# Patient Record
Sex: Female | Born: 1955
Health system: Southern US, Community
[De-identification: ages and names within clinical notes are randomized; demographics above are authoritative.]

## PROBLEM LIST (undated history)

## (undated) DIAGNOSIS — I10 Essential (primary) hypertension: Secondary | ICD-10-CM

## (undated) DIAGNOSIS — I872 Venous insufficiency (chronic) (peripheral): Secondary | ICD-10-CM

## (undated) DIAGNOSIS — E785 Hyperlipidemia, unspecified: Secondary | ICD-10-CM

## (undated) DIAGNOSIS — G629 Polyneuropathy, unspecified: Secondary | ICD-10-CM

## (undated) DIAGNOSIS — G9511 Acute infarction of spinal cord (embolic) (nonembolic): Secondary | ICD-10-CM

## (undated) DIAGNOSIS — R609 Edema, unspecified: Secondary | ICD-10-CM

## (undated) DIAGNOSIS — E282 Polycystic ovarian syndrome: Secondary | ICD-10-CM

## (undated) DIAGNOSIS — E119 Type 2 diabetes mellitus without complications: Secondary | ICD-10-CM

## (undated) DIAGNOSIS — E559 Vitamin D deficiency, unspecified: Secondary | ICD-10-CM

## (undated) DIAGNOSIS — M1990 Unspecified osteoarthritis, unspecified site: Secondary | ICD-10-CM

## (undated) DIAGNOSIS — I809 Phlebitis and thrombophlebitis of unspecified site: Secondary | ICD-10-CM

## (undated) HISTORY — DX: Venous insufficiency (chronic) (peripheral): I87.2

## (undated) HISTORY — DX: Edema, unspecified: R60.9

## (undated) HISTORY — DX: Type 2 diabetes mellitus without complications: E11.9

## (undated) HISTORY — PX: OTHER SURGICAL HISTORY: SHX169

## (undated) HISTORY — DX: Phlebitis and thrombophlebitis of unspecified site: I80.9

## (undated) HISTORY — DX: Polycystic ovarian syndrome: E28.2

## (undated) HISTORY — PX: BREAST BIOPSY: SHX20

## (undated) HISTORY — DX: Hyperlipidemia, unspecified: E78.5

## (undated) HISTORY — DX: Vitamin D deficiency, unspecified: E55.9

---

## 2001-03-11 ENCOUNTER — Encounter: Payer: Self-pay | Admitting: Family Medicine

## 2001-03-11 ENCOUNTER — Encounter: Admission: RE | Admit: 2001-03-11 | Discharge: 2001-03-11 | Payer: Self-pay | Admitting: Family Medicine

## 2006-06-23 ENCOUNTER — Ambulatory Visit (HOSPITAL_COMMUNITY): Admission: RE | Admit: 2006-06-23 | Discharge: 2006-06-23 | Payer: Self-pay | Admitting: Obstetrics & Gynecology

## 2006-07-15 ENCOUNTER — Encounter: Admission: RE | Admit: 2006-07-15 | Discharge: 2006-10-13 | Payer: Self-pay | Admitting: Family Medicine

## 2006-08-12 ENCOUNTER — Ambulatory Visit: Payer: Self-pay | Admitting: Gynecology

## 2006-08-26 ENCOUNTER — Ambulatory Visit: Payer: Self-pay | Admitting: Obstetrics and Gynecology

## 2007-03-23 ENCOUNTER — Other Ambulatory Visit: Admission: RE | Admit: 2007-03-23 | Discharge: 2007-03-23 | Payer: Self-pay | Admitting: Family Medicine

## 2007-06-28 ENCOUNTER — Ambulatory Visit (HOSPITAL_COMMUNITY): Admission: RE | Admit: 2007-06-28 | Discharge: 2007-06-28 | Payer: Self-pay | Admitting: Family Medicine

## 2008-06-28 ENCOUNTER — Ambulatory Visit (HOSPITAL_COMMUNITY): Admission: RE | Admit: 2008-06-28 | Discharge: 2008-06-28 | Payer: Self-pay | Admitting: Family Medicine

## 2009-03-11 ENCOUNTER — Other Ambulatory Visit: Admission: RE | Admit: 2009-03-11 | Discharge: 2009-03-11 | Payer: Self-pay | Admitting: Family Medicine

## 2009-07-01 ENCOUNTER — Ambulatory Visit (HOSPITAL_COMMUNITY): Admission: RE | Admit: 2009-07-01 | Discharge: 2009-07-01 | Payer: Self-pay | Admitting: Family Medicine

## 2009-08-31 HISTORY — PX: JOINT REPLACEMENT: SHX530

## 2010-03-12 ENCOUNTER — Other Ambulatory Visit: Admission: RE | Admit: 2010-03-12 | Discharge: 2010-03-12 | Payer: Self-pay | Admitting: Family Medicine

## 2010-04-30 ENCOUNTER — Inpatient Hospital Stay (HOSPITAL_COMMUNITY): Admission: RE | Admit: 2010-04-30 | Discharge: 2010-05-03 | Payer: Self-pay | Admitting: Orthopedic Surgery

## 2010-07-02 ENCOUNTER — Ambulatory Visit (HOSPITAL_COMMUNITY): Admission: RE | Admit: 2010-07-02 | Discharge: 2010-07-02 | Payer: Self-pay | Admitting: Family Medicine

## 2010-11-13 LAB — GLUCOSE, CAPILLARY
Glucose-Capillary: 114 mg/dL — ABNORMAL HIGH (ref 70–99)
Glucose-Capillary: 132 mg/dL — ABNORMAL HIGH (ref 70–99)
Glucose-Capillary: 133 mg/dL — ABNORMAL HIGH (ref 70–99)
Glucose-Capillary: 137 mg/dL — ABNORMAL HIGH (ref 70–99)
Glucose-Capillary: 140 mg/dL — ABNORMAL HIGH (ref 70–99)
Glucose-Capillary: 164 mg/dL — ABNORMAL HIGH (ref 70–99)
Glucose-Capillary: 177 mg/dL — ABNORMAL HIGH (ref 70–99)

## 2010-11-13 LAB — CBC
HCT: 30.7 % — ABNORMAL LOW (ref 36.0–46.0)
HCT: 32.1 % — ABNORMAL LOW (ref 36.0–46.0)
Hemoglobin: 10.5 g/dL — ABNORMAL LOW (ref 12.0–15.0)
MCHC: 33.9 g/dL (ref 30.0–36.0)
MCV: 84.7 fL (ref 78.0–100.0)
MCV: 84.9 fL (ref 78.0–100.0)
Platelets: 145 10*3/uL — ABNORMAL LOW (ref 150–400)
RBC: 3.56 MIL/uL — ABNORMAL LOW (ref 3.87–5.11)
RDW: 14.5 % (ref 11.5–15.5)
RDW: 14.5 % (ref 11.5–15.5)
WBC: 8.8 10*3/uL (ref 4.0–10.5)

## 2010-11-13 LAB — BASIC METABOLIC PANEL
CO2: 28 mEq/L (ref 19–32)
CO2: 31 mEq/L (ref 19–32)
Chloride: 104 mEq/L (ref 96–112)
Creatinine, Ser: 0.76 mg/dL (ref 0.4–1.2)
Glucose, Bld: 158 mg/dL — ABNORMAL HIGH (ref 70–99)
Potassium: 4.1 mEq/L (ref 3.5–5.1)

## 2010-11-13 LAB — PROTIME-INR
INR: 1.57 — ABNORMAL HIGH (ref 0.00–1.49)
INR: 1.91 — ABNORMAL HIGH (ref 0.00–1.49)
Prothrombin Time: 19 seconds — ABNORMAL HIGH (ref 11.6–15.2)

## 2010-11-14 LAB — COMPREHENSIVE METABOLIC PANEL
AST: 15 U/L (ref 0–37)
Albumin: 3.3 g/dL — ABNORMAL LOW (ref 3.5–5.2)
Alkaline Phosphatase: 69 U/L (ref 39–117)
BUN: 13 mg/dL (ref 6–23)
Chloride: 107 mEq/L (ref 96–112)
GFR calc Af Amer: >60 mL/min (ref >60)
Potassium: 3.7 mEq/L (ref 3.5–5.1)
Total Protein: 7.2 g/dL (ref 6.0–8.3)

## 2010-11-14 LAB — TYPE AND SCREEN: Antibody Screen: NEGATIVE

## 2010-11-14 LAB — URINE MICROSCOPIC-ADD ON

## 2010-11-14 LAB — URINALYSIS, ROUTINE W REFLEX MICROSCOPIC
Hgb urine dipstick: NEGATIVE
Nitrite: NEGATIVE
Specific Gravity, Urine: 1.038 — ABNORMAL HIGH (ref 1.005–1.030)
Urobilinogen, UA: 0.2 mg/dL (ref 0.0–1.0)
pH: 5.5 (ref 5.0–8.0)

## 2010-11-14 LAB — GLUCOSE, CAPILLARY: Glucose-Capillary: 108 mg/dL — ABNORMAL HIGH (ref 70–99)

## 2010-11-14 LAB — CBC
HCT: 39.2 % (ref 36.0–46.0)
Hemoglobin: 13.4 g/dL (ref 12.0–15.0)
MCH: 28.7 pg (ref 26.0–34.0)
MCHC: 34.1 g/dL (ref 30.0–36.0)
MCV: 84.3 fL (ref 78.0–100.0)
RBC: 4.66 MIL/uL (ref 3.87–5.11)
WBC: 6.3 10*3/uL (ref 4.0–10.5)

## 2010-11-14 LAB — PROTIME-INR: INR: 1.01 (ref 0.00–1.49)

## 2010-11-14 LAB — APTT: aPTT: 30 seconds (ref 24–37)

## 2011-01-16 NOTE — Group Therapy Note (Signed)
NAMEMarland Kitchen  Laura Blackwell, Laura Blackwell NO.:  [id number]   MEDICAL RECORD NO.:  [id number]          PATIENT TYPE:  WOC   LOCATION:  [facility]                   FACILITY:  [facility]   PHYSICIAN:  Ginger Carne, MD DATE OF BIRTH:  Jan 28, 1956   DATE OF SERVICE:  08/12/2006                                  CLINIC NOTE   The patient presents today with increasing facial hair and amenorrhea  for 6-7 years.  The patient is a markedly obese female weighing 130 kg,  5 feet 7 inches.  She has always had facial hair, her sideburns as well  as a mustache and hair growth on her breast.  She states that recently  she has had increasing hair growth on her cheeks.  She has no menopausal  symptoms, does not take any over-the-counter herbal or other medications  to cause hirsutism or amenorrhea.  The patient states that she has not  had a bleeding over the past 6-7 years.  Her medical and surgical  history are per her records.  The patient denies abdominal or pelvic  pain.   PHYSICAL EXAM:  Of note is hirsutism of breast as well as her face.   IMPRESSION:  Amenorrhea and probable polycystic ovarian syndrome.   PLAN:  The patient will undergo evaluation for insulin-resistance type 2  diabetes with a three-hour 75 grams oral glucose tolerance test.  In  addition, she will have an FSH, LH, prolactin, TSH level, lipid panel,  testosterone level (total) and 17 hydroxy progesterone level to exclude  the possibility of late onset 21-hydroxylase enzyme deficiency.  More  than likely, this patient is a metabolic syndrome patient with  polycystic ovarian syndrome and insulin resistance based on the weight  and body habitus.  After said testing is completed, she will return in  two weeks following testing for discussion about her testing and any  further workup that is necessary.  I am concerned about the fact that if  she in fact does not have hypothalamic, hypogonadotropic amenorrhea that  she be cycled  to avoid the possibility for the propensity of uterine  carcinoma and hyperplasia.           ______________________________  Ginger Carne, MD     SHB/MEDQ  D:  08/12/2006  T:  08/13/2006  Job:  [id number]

## 2011-01-16 NOTE — Group Therapy Note (Signed)
NAMEMarland Kitchen  Laura Blackwell, Laura Blackwell NO.:  [id number]   MEDICAL RECORD NO.:  [id number]          PATIENT TYPE:  WOC   LOCATION:  [facility]                   FACILITY:  [facility]   PHYSICIAN:  Argentina Donovan, MD        DATE OF BIRTH:  1956/06/17   DATE OF SERVICE:                                  CLINIC NOTE   GYN Clinic   CHIEF COMPLAINT:  Follow up hirsutism and amenorrhea.   SUBJECTIVE:  Patient is a 55 year old African American female with  increasing facial hair as well as amenorrhea for the past 6-7 years.  She was seen 2 weeks ago for her initial evaluation.  She was felt to  have probable PCOS, and is here for a followup of her laboratory  results.  Please see prior note for full details of this visit.   OBJECTIVE:  GENERAL EXAM:  Pleasant in no acute distress.  Patient does  have hirsutism on her chest as well as her face.   LABORATORY DATA:  Showed a total cholesterol 223, LDL 144, TSH 0.985,  FSH 37.9, LH 23.9, prolactin 5.7, testosterone elevated at 91.08, 2-hour  glucose tolerance 167, 1-hour glucose tolerance 212, fasting glucose 99,  17-hydroxyprogesterone 11.   IMPRESSION:  1. Amenorrhea secondary to polycystic ovary syndrome.  2. Hirsutism.   PLAN:  I had a long discussion with patient on her need for weight loss.  Will start patient on Lo Ovral given her propensity for uterine  carcinoma and endometrial hyperplasia.  Patient will follow up in 3-4  months with Dr. Mia Creek.  Counseled patient that it will likely to take  over a year for her hirsutism to improve.  Will consider starting  Metformin at that visit in 3-4 months.  Patient will need renal function  checked prior to initiating this medication.  Counseled patient that she  will need close followup to monitor her insulin resistance, as well as  other risk factors including her cholesterol.     ______________________________  Benn Moulder, MD    ______________________________  Argentina Donovan,  MD    MR/MEDQ  D:  08/26/2006  T:  08/26/2006  Job:  [id number]

## 2011-05-18 ENCOUNTER — Other Ambulatory Visit (HOSPITAL_COMMUNITY): Payer: Self-pay | Admitting: Family Medicine

## 2011-05-18 DIAGNOSIS — Z1231 Encounter for screening mammogram for malignant neoplasm of breast: Secondary | ICD-10-CM

## 2011-07-07 ENCOUNTER — Ambulatory Visit (HOSPITAL_COMMUNITY)
Admission: RE | Admit: 2011-07-07 | Discharge: 2011-07-07 | Disposition: A | Discharge status: Home / Self Care | Payer: 59 | Source: Ambulatory Visit | Attending: Family Medicine | Admitting: Family Medicine

## 2011-07-07 DIAGNOSIS — Z1231 Encounter for screening mammogram for malignant neoplasm of breast: Secondary | ICD-10-CM | POA: Insufficient documentation

## 2012-06-15 ENCOUNTER — Other Ambulatory Visit (HOSPITAL_COMMUNITY): Payer: Self-pay | Admitting: Family Medicine

## 2012-06-15 DIAGNOSIS — Z1231 Encounter for screening mammogram for malignant neoplasm of breast: Secondary | ICD-10-CM

## 2012-07-07 ENCOUNTER — Ambulatory Visit (HOSPITAL_COMMUNITY)
Admission: RE | Admit: 2012-07-07 | Discharge: 2012-07-07 | Disposition: A | Discharge status: Home / Self Care | Payer: 59 | Source: Ambulatory Visit | Attending: Family Medicine | Admitting: Family Medicine

## 2012-07-07 DIAGNOSIS — Z1231 Encounter for screening mammogram for malignant neoplasm of breast: Secondary | ICD-10-CM

## 2013-04-06 ENCOUNTER — Other Ambulatory Visit (HOSPITAL_COMMUNITY): Payer: Self-pay | Admitting: Family Medicine

## 2013-04-06 DIAGNOSIS — Z1231 Encounter for screening mammogram for malignant neoplasm of breast: Secondary | ICD-10-CM

## 2013-07-10 ENCOUNTER — Ambulatory Visit (HOSPITAL_COMMUNITY)
Admission: RE | Admit: 2013-07-10 | Discharge: 2013-07-10 | Disposition: A | Discharge status: Home / Self Care | Payer: 59 | Source: Ambulatory Visit | Attending: Family Medicine | Admitting: Family Medicine

## 2013-07-10 ENCOUNTER — Other Ambulatory Visit (HOSPITAL_COMMUNITY): Payer: Self-pay | Admitting: Family Medicine

## 2013-07-10 DIAGNOSIS — Z1231 Encounter for screening mammogram for malignant neoplasm of breast: Secondary | ICD-10-CM

## 2014-01-30 ENCOUNTER — Other Ambulatory Visit (HOSPITAL_COMMUNITY): Admission: RE | Admit: 2014-01-30 | Payer: 59 | Source: Ambulatory Visit | Admitting: Family Medicine

## 2014-01-30 ENCOUNTER — Other Ambulatory Visit: Payer: Self-pay | Admitting: Family Medicine

## 2014-01-30 DIAGNOSIS — Z0000 Encounter for general adult medical examination without abnormal findings: Secondary | ICD-10-CM | POA: Insufficient documentation

## 2014-05-25 ENCOUNTER — Other Ambulatory Visit (HOSPITAL_COMMUNITY): Payer: Self-pay | Admitting: Family Medicine

## 2014-05-25 DIAGNOSIS — Z1231 Encounter for screening mammogram for malignant neoplasm of breast: Secondary | ICD-10-CM

## 2014-07-12 ENCOUNTER — Ambulatory Visit (HOSPITAL_COMMUNITY)
Admission: RE | Admit: 2014-07-12 | Discharge: 2014-07-12 | Disposition: A | Discharge status: Home / Self Care | Payer: 59 | Source: Ambulatory Visit | Attending: Family Medicine | Admitting: Family Medicine

## 2014-07-12 DIAGNOSIS — Z1231 Encounter for screening mammogram for malignant neoplasm of breast: Secondary | ICD-10-CM | POA: Diagnosis present

## 2015-06-10 ENCOUNTER — Other Ambulatory Visit: Payer: Self-pay

## 2015-06-10 DIAGNOSIS — Z1231 Encounter for screening mammogram for malignant neoplasm of breast: Secondary | ICD-10-CM

## 2015-07-16 ENCOUNTER — Ambulatory Visit
Admission: RE | Admit: 2015-07-16 | Discharge: 2015-07-16 | Disposition: A | Discharge status: Home / Self Care | Payer: 59 | Source: Ambulatory Visit

## 2015-07-16 DIAGNOSIS — Z1231 Encounter for screening mammogram for malignant neoplasm of breast: Secondary | ICD-10-CM

## 2015-09-18 ENCOUNTER — Ambulatory Visit (INDEPENDENT_AMBULATORY_CARE_PROVIDER_SITE_OTHER): Payer: 59 | Admitting: Podiatry

## 2015-09-18 ENCOUNTER — Encounter: Payer: Self-pay | Admitting: Podiatry

## 2015-09-18 VITALS — BP 110/72 | HR 86 | Resp 12

## 2015-09-18 DIAGNOSIS — L03031 Cellulitis of right toe: Secondary | ICD-10-CM

## 2015-09-18 DIAGNOSIS — L03011 Cellulitis of right finger: Secondary | ICD-10-CM

## 2015-09-18 MED ORDER — AMOXICILLIN-POT CLAVULANATE 875-125 MG PO TABS: 1.0000 | ORAL_TABLET | Freq: Two times a day (BID) | ORAL | Status: DC

## 2015-09-18 NOTE — Progress Notes (Signed)
   Subjective:    Patient ID: Laura Blackwell, female    DOB: 1956-01-27, 60 y.o.   MRN: [id number]  HPI   Today this patient complains of a draining sore right hallux toenail area for the past 3 weeks with persistence of drainage and low-grade discomfort with direct pressure and shoe wearing. Patient has been treating the area with a Band-Aid and presents today for evaluation.  Patient is a diabetic for approximately 3 years and describes her A1c at 6.5 She denies any history of ulceration, claudication or amputation  Patient describes left lower extremity chronic edema since they D 82 associated with trauma surgery  Review of Systems  Musculoskeletal: Positive for back pain.  Skin: Positive for color change.       Objective:   Physical Exam  Orientated 3  Vascular: Chronic lymphedema left lower extremity No palpable tenderness in calves bilaterally DP pulses 2/4 bilaterally PT pulses 2/4 right and nonpalpable left (please note patient has chronic edema making it difficult to palpate the pulse  Neurological: Sensation to 10 g monofilament wire intact 5/5 bilaterally Vibratory sensation intact bilaterally Ankle reflex equal and reactive bilaterally  Dermatological: No open skin lesions bilaterally Keratoses fifth toe right Right hallux nail plate is partially detached from the underlying nailbed with with a serous bloody drainage noted on the Band-Aid The toenails in general are elongated with occasional texture and color changes noted distally  (The right hallux nail plate was debrided from the nailbed revealing a vascular lesion 10 mm in diameter with some partial detachment the nailbed. There is slight malodor in the area without active drainage. There is no erythema or warmth)  Musculoskeletal: Palpable tenderness in the dorsal right hallux with mobility of the nail plate      Assessment & Plan:   Assessment: Chronic lymphedema left lower  extremity Protective sensation intact Vascular lesion right nailbed with possible low-grade infection  Plan: The right hallux nail plate was easily debrided from the nailbed revealing a loosely attached vascular lesion that bled easily when gently moved. The lesion was not excised. An antibiotic dressing was applied  Patient will begin soaks and daily application of topical antibiotic and cover with a Band-Aid Rx Augmentin 875/125 by mouth twice a day 7 days one refill  Reevaluate 2 weeks

## 2015-09-18 NOTE — Patient Instructions (Signed)
Today her diabetic foot examination demonstrated chronic swelling in the left lower extremity Beneath the right great toenail after debridement there was a fleshy/vascular-like growth in the nailbed with slight drainage and malodor Begin soaks including Epsom salt soaks or antibacterial soft soap rinses daily and apply topical antibiotic ointment and cover with a Band-Aid Begin taking antibiotics by mouth one twice a day 7 days Wear a soft tissue on the right foot  Diabetes and Foot Care Diabetes may cause you to have problems because of poor blood supply (circulation) to your feet and legs. This may cause the skin on your feet to become thinner, break easier, and heal more slowly. Your skin may become dry, and the skin may peel and crack. You may also have nerve damage in your legs and feet causing decreased feeling in them. You may not notice minor injuries to your feet that could lead to infections or more serious problems. Taking care of your feet is one of the most important things you can do for yourself.  HOME CARE INSTRUCTIONS  Wear shoes at all times, even in the house. Do not go barefoot. Bare feet are easily injured.  Check your feet daily for blisters, cuts, and redness. If you cannot see the bottom of your feet, use a mirror or ask someone for help.  Wash your feet with warm water (do not use hot water) and mild soap. Then pat your feet and the areas between your toes until they are completely dry. Do not soak your feet as this can dry your skin.  Apply a moisturizing lotion or petroleum jelly (that does not contain alcohol and is unscented) to the skin on your feet and to dry, brittle toenails. Do not apply lotion between your toes.  Trim your toenails straight across. Do not dig under them or around the cuticle. File the edges of your nails with an emery board or nail file.  Do not cut corns or calluses or try to remove them with medicine.  Wear clean socks or stockings every  day. Make sure they are not too tight. Do not wear knee-high stockings since they may decrease blood flow to your legs.  Wear shoes that fit properly and have enough cushioning. To break in new shoes, wear them for just a few hours a day. This prevents you from injuring your feet. Always look in your shoes before you put them on to be sure there are no objects inside.  Do not cross your legs. This may decrease the blood flow to your feet.  If you find a minor scrape, cut, or break in the skin on your feet, keep it and the skin around it clean and dry. These areas may be cleansed with mild soap and water. Do not cleanse the area with peroxide, alcohol, or iodine.  When you remove an adhesive bandage, be sure not to damage the skin around it.  If you have a wound, look at it several times a day to make sure it is healing.  Do not use heating pads or hot water bottles. They may burn your skin. If you have lost feeling in your feet or legs, you may not know it is happening until it is too late.  Make sure your health care provider performs a complete foot exam at least annually or more often if you have foot problems. Report any cuts, sores, or bruises to your health care provider immediately. SEEK MEDICAL CARE IF:   You have an  injury that is not healing.  You have cuts or breaks in the skin.  You have an ingrown nail.  You notice redness on your legs or feet.  You feel burning or tingling in your legs or feet.  You have pain or cramps in your legs and feet.  Your legs or feet are numb.  Your feet always feel cold. SEEK IMMEDIATE MEDICAL CARE IF:   There is increasing redness, swelling, or pain in or around a wound.  There is a red line that goes up your leg.  Pus is coming from a wound.  You develop a fever or as directed by your health care provider.  You notice a bad smell coming from an ulcer or wound.   This information is not intended to replace advice given to you by  your health care provider. Make sure you discuss any questions you have with your health care provider.   Document Released: 08/14/2000 Document Revised: 04/19/2013 Document Reviewed: 01/24/2013 Elsevier Interactive Patient Education [employer].

## 2015-10-02 ENCOUNTER — Ambulatory Visit (INDEPENDENT_AMBULATORY_CARE_PROVIDER_SITE_OTHER): Payer: 59 | Admitting: Podiatry

## 2015-10-02 ENCOUNTER — Encounter: Payer: Self-pay | Admitting: Podiatry

## 2015-10-02 DIAGNOSIS — L03031 Cellulitis of right toe: Secondary | ICD-10-CM | POA: Diagnosis not present

## 2015-10-02 DIAGNOSIS — L03011 Cellulitis of right finger: Secondary | ICD-10-CM

## 2015-10-02 NOTE — Patient Instructions (Signed)
Apply Vaseline and a Band-Aid to the right big toenail bed daily until the nailbed looks normal and is not sensitive Diabetes and Foot Care Diabetes may cause you to have problems because of poor blood supply (circulation) to your feet and legs. This may cause the skin on your feet to become thinner, break easier, and heal more slowly. Your skin may become dry, and the skin may peel and crack. You may also have nerve damage in your legs and feet causing decreased feeling in them. You may not notice minor injuries to your feet that could lead to infections or more serious problems. Taking care of your feet is one of the most important things you can do for yourself.  HOME CARE INSTRUCTIONS  Wear shoes at all times, even in the house. Do not go barefoot. Bare feet are easily injured.  Check your feet daily for blisters, cuts, and redness. If you cannot see the bottom of your feet, use a mirror or ask someone for help.  Wash your feet with warm water (do not use hot water) and mild soap. Then pat your feet and the areas between your toes until they are completely dry. Do not soak your feet as this can dry your skin.  Apply a moisturizing lotion or petroleum jelly (that does not contain alcohol and is unscented) to the skin on your feet and to dry, brittle toenails. Do not apply lotion between your toes.  Trim your toenails straight across. Do not dig under them or around the cuticle. File the edges of your nails with an emery board or nail file.  Do not cut corns or calluses or try to remove them with medicine.  Wear clean socks or stockings every day. Make sure they are not too tight. Do not wear knee-high stockings since they may decrease blood flow to your legs.  Wear shoes that fit properly and have enough cushioning. To break in new shoes, wear them for just a few hours a day. This prevents you from injuring your feet. Always look in your shoes before you put them on to be sure there are no  objects inside.  Do not cross your legs. This may decrease the blood flow to your feet.  If you find a minor scrape, cut, or break in the skin on your feet, keep it and the skin around it clean and dry. These areas may be cleansed with mild soap and water. Do not cleanse the area with peroxide, alcohol, or iodine.  When you remove an adhesive bandage, be sure not to damage the skin around it.  If you have a wound, look at it several times a day to make sure it is healing.  Do not use heating pads or hot water bottles. They may burn your skin. If you have lost feeling in your feet or legs, you may not know it is happening until it is too late.  Make sure your health care provider performs a complete foot exam at least annually or more often if you have foot problems. Report any cuts, sores, or bruises to your health care provider immediately. SEEK MEDICAL CARE IF:   You have an injury that is not healing.  You have cuts or breaks in the skin.  You have an ingrown nail.  You notice redness on your legs or feet.  You feel burning or tingling in your legs or feet.  You have pain or cramps in your legs and feet.  Your legs  or feet are numb.  Your feet always feel cold. SEEK IMMEDIATE MEDICAL CARE IF:   There is increasing redness, swelling, or pain in or around a wound.  There is a red line that goes up your leg.  Pus is coming from a wound.  You develop a fever or as directed by your health care provider.  You notice a bad smell coming from an ulcer or wound.   This information is not intended to replace advice given to you by your health care provider. Make sure you discuss any questions you have with your health care provider.   Document Released: 08/14/2000 Document Revised: 04/19/2013 Document Reviewed: 01/24/2013 Elsevier Interactive Patient Education [employer].

## 2015-10-03 NOTE — Progress Notes (Signed)
Patient ID: Laura Blackwell, female   DOB: 1955-09-16, 60 y.o.   MRN: [id number]  Subjective: This patient presents for follow-up care for paronychia right hallux. The nail was debrided and Augmentin 875/125 by mouth twice a day was prescribed. Patient had completed antibiotic. There was a loosely attached vascular lesion noted on the dorsal aspect of the right hallux nailbed after debridement  Objective: Right hallux nail has been debrided as described above. There is a residual crusted area on the proximal nailbed that is beginning to slough. There is no surrounding erythema, edema, drainage , warmth.  Assessment: Resolved paronychia and resolving vascular lesion the dorsal aspect the right foot  Plan: Patient will apply Vaseline on Band-Aid to the right hallux nailbed and cover with a Band-Aid to the nailbed symptoms a normal smooth appearance  Reappoint at patient's request Also, patient requested toenail debridement which was done today. I advised her that she could go to the pedicurist to have her nails trimmed with exceptions: No whirlpool and no manipulation of the cuticles. The pedicurist could just trim the distal nail since it smooth the distal aspects of the nail plates  Patient returned if she has any future concerns or yearly

## 2015-11-25 ENCOUNTER — Ambulatory Visit: Payer: Self-pay | Admitting: Orthopedic Surgery

## 2015-11-26 ENCOUNTER — Encounter (HOSPITAL_COMMUNITY): Payer: Self-pay

## 2015-11-26 ENCOUNTER — Encounter (HOSPITAL_COMMUNITY)
Admission: RE | Admit: 2015-11-26 | Discharge: 2015-11-26 | Disposition: A | Discharge status: Home / Self Care | Payer: 59 | Source: Ambulatory Visit | Attending: Orthopedic Surgery | Admitting: Orthopedic Surgery

## 2015-11-26 DIAGNOSIS — M1712 Unilateral primary osteoarthritis, left knee: Secondary | ICD-10-CM | POA: Diagnosis not present

## 2015-11-26 DIAGNOSIS — Z01812 Encounter for preprocedural laboratory examination: Secondary | ICD-10-CM | POA: Insufficient documentation

## 2015-11-26 HISTORY — DX: Unspecified osteoarthritis, unspecified site: M19.90

## 2015-11-26 HISTORY — DX: Edema, unspecified: R60.9

## 2015-11-26 HISTORY — DX: Essential (primary) hypertension: I10

## 2015-11-26 LAB — COMPREHENSIVE METABOLIC PANEL
ALBUMIN: 3.5 g/dL (ref 3.5–5.0)
ALK PHOS: 73 U/L (ref 38–126)
ALT: 17 U/L (ref 14–54)
AST: 15 U/L (ref 15–41)
Anion gap: 9 (ref 5–15)
BILIRUBIN TOTAL: 0.4 mg/dL (ref 0.3–1.2)
BUN: 16 mg/dL (ref 6–20)
CALCIUM: 10.1 mg/dL (ref 8.9–10.3)
CO2: 26 mmol/L (ref 22–32)
Chloride: 106 mmol/L (ref 101–111)
Creatinine, Ser: 0.66 mg/dL (ref 0.44–1.00)
GFR calc Af Amer: >60 mL/min (ref >60)
GFR calc non Af Amer: >60 mL/min (ref >60)
GLUCOSE: 153 mg/dL — AB (ref 65–99)
Potassium: 4.2 mmol/L (ref 3.5–5.1)
Sodium: 141 mmol/L (ref 135–145)
TOTAL PROTEIN: 7.1 g/dL (ref 6.5–8.1)

## 2015-11-26 LAB — CBC
HEMATOCRIT: 39 % (ref 36.0–46.0)
HEMOGLOBIN: 12.6 g/dL (ref 12.0–15.0)
MCH: 27.2 pg (ref 26.0–34.0)
MCHC: 32.3 g/dL (ref 30.0–36.0)
MCV: 84.2 fL (ref 78.0–100.0)
Platelets: 229 10*3/uL (ref 150–400)
RBC: 4.63 MIL/uL (ref 3.87–5.11)
RDW: 15.2 % (ref 11.5–15.5)
WBC: 6.3 10*3/uL (ref 4.0–10.5)

## 2015-11-26 LAB — URINE MICROSCOPIC-ADD ON

## 2015-11-26 LAB — PROTIME-INR
INR: 1.44 (ref 0.00–1.49)
Prothrombin Time: 17.7 seconds — ABNORMAL HIGH (ref 11.6–15.2)

## 2015-11-26 LAB — URINALYSIS, ROUTINE W REFLEX MICROSCOPIC
BILIRUBIN URINE: NEGATIVE
Glucose, UA: NEGATIVE mg/dL
Hgb urine dipstick: NEGATIVE
Ketones, ur: NEGATIVE mg/dL
NITRITE: NEGATIVE
PROTEIN: NEGATIVE mg/dL
SPECIFIC GRAVITY, URINE: 1.029 (ref 1.005–1.030)
pH: 6 (ref 5.0–8.0)

## 2015-11-26 LAB — SURGICAL PCR SCREEN
MRSA, PCR: NEGATIVE
STAPHYLOCOCCUS AUREUS: NEGATIVE

## 2015-11-26 LAB — APTT: APTT: 31 s (ref 24–37)

## 2015-11-26 NOTE — Progress Notes (Signed)
A1C done 11/14/15 on chart  EKG- 01/2015 on chart and LOV from PCp done 11/14/15 on chart

## 2015-11-26 NOTE — Progress Notes (Signed)
U/A and micro done 11/26/15 faxed via [facility] to Dr Wynelle Link.

## 2015-11-26 NOTE — Patient Instructions (Signed)
Laura Blackwell  11/26/2015   Your procedure is scheduled on: 12/02/2015    Report to [facility] Main  Entrance take [place]  elevators to 3rd floor to  [place] at    [phone].  Call this number if you have problems the morning of surgery [phone]   Remember: ONLY 1 PERSON MAY GO WITH YOU TO SHORT STAY TO GET  READY MORNING OF Laura Blackwell.  Do not eat food or drink liquids :After Midnight.  Eat a good healthy snack prior to bedtime    Take these medicines the morning of surgery with A SIP OF WATER: Amlodipine ( NOrvasc), Hydrocodone if needed  DO NOT TAKE ANY DIABETIC MEDICATIONS DAY OF YOUR SURGERY                               You may not have any metal on your body including hair pins and              piercings  Do not wear jewelry, make-up, lotions, powders or perfumes, deodorant             Do not wear nail polish.  Do not shave  48 hours prior to surgery.     Do not bring valuables to the hospital. [name].  Contacts, dentures or bridgework may not be worn into surgery.  Leave suitcase in the car. After surgery it may be brought to your room.     Marland Kitchen    Special Instructions: coughing and deep breathing exercises, leg exercises               Please read over the following fact sheets you were given: _____________________________________________________________________             [facility] - Preparing for Surgery Before surgery, you can play an important role.  Because skin is not sterile, your skin needs to be as free of germs as possible.  You can reduce the number of germs on your skin by washing with CHG (chlorahexidine gluconate) soap before surgery.  CHG is an antiseptic cleaner which kills germs and bonds with the skin to continue killing germs even after washing. Please DO NOT use if you have an allergy to CHG or antibacterial soaps.  If your skin becomes reddened/irritated  stop using the CHG and inform your nurse when you arrive at Short Stay. Do not shave (including legs and underarms) for at least 48 hours prior to the first CHG shower.  You may shave your face/neck. Please follow these instructions carefully:  1.  Shower with CHG Soap the night before surgery and the  morning of Surgery.  2.  If you choose to wash your hair, wash your hair first as usual with your  normal  shampoo.  3.  After you shampoo, rinse your hair and body thoroughly to remove the  shampoo.                           4.  Use CHG as you would any other liquid soap.  You can apply chg directly  to the skin and wash  Gently with a scrungie or clean washcloth.  5.  Apply the CHG Soap to your body ONLY FROM THE NECK DOWN.   Do not use on face/ open                           Wound or open sores. Avoid contact with eyes, ears mouth and genitals (private parts).                       Wash face,  Genitals (private parts) with your normal soap.             6.  Wash thoroughly, paying special attention to the area where your surgery  will be performed.  7.  Thoroughly rinse your body with warm water from the neck down.  8.  DO NOT shower/wash with your normal soap after using and rinsing off  the CHG Soap.                9.  Pat yourself dry with a clean towel.            10.  Wear clean pajamas.            11.  Place clean sheets on your bed the night of your first shower and do not  sleep with pets. Day of Surgery : Do not apply any lotions/deodorants the morning of surgery.  Please wear clean clothes to the hospital/surgery center.  FAILURE TO FOLLOW THESE INSTRUCTIONS MAY RESULT IN THE CANCELLATION OF YOUR SURGERY PATIENT SIGNATURE_________________________________  NURSE SIGNATURE__________________________________  ________________________________________________________________________  WHAT IS A BLOOD TRANSFUSION? Blood Transfusion Information  A transfusion is the  replacement of blood or some of its parts. Blood is made up of multiple cells which provide different functions.  Red blood cells carry oxygen and are used for blood loss replacement.  White blood cells fight against infection.  Platelets control bleeding.  Plasma helps clot blood.  Other blood products are available for specialized needs, such as hemophilia or other clotting disorders. BEFORE THE TRANSFUSION  Who gives blood for transfusions?   Healthy volunteers who are fully evaluated to make sure their blood is safe. This is blood bank blood. Transfusion therapy is the safest it has ever been in the practice of medicine. Before blood is taken from a donor, a complete history is taken to make sure that person has no history of diseases nor engages in risky social behavior (examples are intravenous drug use or sexual activity with multiple partners). The donor's travel history is screened to minimize risk of transmitting infections, such as malaria. The donated blood is tested for signs of infectious diseases, such as HIV and hepatitis. The blood is then tested to be sure it is compatible with you in order to minimize the chance of a transfusion reaction. If you or a relative donates blood, this is often done in anticipation of surgery and is not appropriate for emergency situations. It takes many days to process the donated blood. RISKS AND COMPLICATIONS Although transfusion therapy is very safe and saves many lives, the main dangers of transfusion include:  1. Getting an infectious disease. 2. Developing a transfusion reaction. This is an allergic reaction to something in the blood you were given. Every precaution is taken to prevent this. The decision to have a blood transfusion has been considered carefully by your caregiver before blood is given. Blood is not given unless the benefits outweigh  the risks. AFTER THE TRANSFUSION  Right after receiving a blood transfusion, you will usually  feel much better and more energetic. This is especially true if your red blood cells have gotten low (anemic). The transfusion raises the level of the red blood cells which carry oxygen, and this usually causes an energy increase.  The nurse administering the transfusion will monitor you carefully for complications. HOME CARE INSTRUCTIONS  No special instructions are needed after a transfusion. You may find your energy is better. Speak with your caregiver about any limitations on activity for underlying diseases you may have. SEEK MEDICAL CARE IF:   Your condition is not improving after your transfusion.  You develop redness or irritation at the intravenous (IV) site. SEEK IMMEDIATE MEDICAL CARE IF:  Any of the following symptoms occur over the next 12 hours:  Shaking chills.  You have a temperature by mouth above 102 F (38.9 C), not controlled by medicine.  Chest, back, or muscle pain.  People around you feel you are not acting correctly or are confused.  Shortness of breath or difficulty breathing.  Dizziness and fainting.  You get a rash or develop hives.  You have a decrease in urine output.  Your urine turns a dark color or changes to pink, red, or brown. Any of the following symptoms occur over the next 10 days:  You have a temperature by mouth above 102 F (38.9 C), not controlled by medicine.  Shortness of breath.  Weakness after normal activity.  The white part of the eye turns yellow (jaundice).  You have a decrease in the amount of urine or are urinating less often.  Your urine turns a dark color or changes to pink, red, or brown. Document Released: 08/14/2000 Document Revised: 11/09/2011 Document Reviewed: 04/02/2008 ExitCare Patient Information 2014 [place].  _______________________________________________________________________  Incentive Spirometer  An incentive spirometer is a tool that can help keep your lungs clear and active. This tool  measures how well you are filling your lungs with each breath. Taking long deep breaths may help reverse or decrease the chance of developing breathing (pulmonary) problems (especially infection) following:  A long period of time when you are unable to move or be active. BEFORE THE PROCEDURE   If the spirometer includes an indicator to show your best effort, your nurse or respiratory therapist will set it to a desired goal.  If possible, sit up straight or lean slightly forward. Try not to slouch.  Hold the incentive spirometer in an upright position. INSTRUCTIONS FOR USE  3. Sit on the edge of your bed if possible, or sit up as far as you can in bed or on a chair. 4. Hold the incentive spirometer in an upright position. 5. Breathe out normally. 6. Place the mouthpiece in your mouth and seal your lips tightly around it. 7. Breathe in slowly and as deeply as possible, raising the piston or the ball toward the top of the column. 8. Hold your breath for 3-5 seconds or for as long as possible. Allow the piston or ball to fall to the bottom of the column. 9. Remove the mouthpiece from your mouth and breathe out normally. 10. Rest for a few seconds and repeat Steps 1 through 7 at least 10 times every 1-2 hours when you are awake. Take your time and take a few normal breaths between deep breaths. 11. The spirometer may include an indicator to show your best effort. Use the indicator as a goal to work  toward during each repetition. 12. After each set of 10 deep breaths, practice coughing to be sure your lungs are clear. If you have an incision (the cut made at the time of surgery), support your incision when coughing by placing a pillow or rolled up towels firmly against it. Once you are able to get out of bed, walk around indoors and cough well. You may stop using the incentive spirometer when instructed by your caregiver.  RISKS AND COMPLICATIONS  Take your time so you do not get dizzy or  light-headed.  If you are in pain, you may need to take or ask for pain medication before doing incentive spirometry. It is harder to take a deep breath if you are having pain. AFTER USE  Rest and breathe slowly and easily.  It can be helpful to keep track of a log of your progress. Your caregiver can provide you with a simple table to help with this. If you are using the spirometer at home, follow these instructions: [place] IF:   You are having difficultly using the spirometer.  You have trouble using the spirometer as often as instructed.  Your pain medication is not giving enough relief while using the spirometer.  You develop fever of 100.5 F (38.1 C) or higher. SEEK IMMEDIATE MEDICAL CARE IF:   You cough up bloody sputum that had not been present before.  You develop fever of 102 F (38.9 C) or greater.  You develop worsening pain at or near the incision site. MAKE SURE YOU:   Understand these instructions.  Will watch your condition.  Will get help right away if you are not doing well or get worse. Document Released: 12/28/2006 Document Revised: 11/09/2011 Document Reviewed: 02/28/2007 [facility] Patient Information 2014 [place], [place].   ________________________________________________________________________

## 2015-11-27 NOTE — Progress Notes (Addendum)
Left patient on voice mail of cell phone regarding time change of surgery and asked patient to call me back at [phone].  Patient called back and aware of time change.

## 2015-12-01 MED ORDER — DEXTROSE 5 % IV SOLN
3.0000 g | INTRAVENOUS | Status: DC
  Filled 2015-12-01: qty 3000

## 2015-12-01 NOTE — H&P (Signed)
TOTAL HIP ADMISSION H&P  Patient is admitted for left total hip arthroplasty.  Subjective:  Chief Complaint: left hip pain  HPI: Laura Blackwell, 60 y.o. female, has a history of pain and functional disability in the left hip(s) due to arthritis and patient has failed non-surgical conservative treatments for greater than 12 weeks to include NSAID's and/or analgesics, corticosteriod injections, flexibility and strengthening excercises, use of assistive devices and activity modification.  Onset of symptoms was gradual starting 3 years ago with gradually worsening course since that time.The patient noted no past surgery on the left hip(s).  Patient currently rates pain in the left hip at 8 out of 10 with activity. Patient has night pain, worsening of pain with activity and weight bearing, pain that interfers with activities of daily living and pain with passive range of motion. Patient has evidence of periarticular osteophytes and joint space narrowing by imaging studies. This condition presents safety issues increasing the risk of falls.   There is no current active infection.   Past Medical History  Diagnosis Date  . Diabetes mellitus without complication ([place])   . Hypertension   . Edema     in left leg in 1980s on left ankle   . Arthritis     Past Surgical History  Procedure Laterality Date  . Left ankle surgery     . Joint replacement  2011     right hip       Current outpatient prescriptions:  .  amLODipine (NORVASC) 5 MG tablet, Take 5 mg by mouth daily. , Disp: , Rfl:  .  atorvastatin (LIPITOR) 20 MG tablet, Take 20 mg by mouth daily. , Disp: , Rfl:  .  furosemide (LASIX) 20 MG tablet, Take 20 mg by mouth 2 (two) times daily. , Disp: , Rfl:  .  glimepiride (AMARYL) 4 MG tablet, Take 2 mg by mouth 2 (two) times daily. , Disp: , Rfl:  .  HYDROcodone-acetaminophen (NORCO/VICODIN) 5-325 MG tablet, Take 1 tablet by mouth 2 (two) times daily., Disp: , Rfl:  .  losartan (COZAAR)  100 MG tablet, Take 100 mg by mouth every morning. , Disp: , Rfl:  .  metFORMIN (GLUCOPHAGE) 500 MG tablet, Take 1,000 mg by mouth 2 (two) times daily with a meal. , Disp: , Rfl:    No Known Allergies  Social History  Substance Use Topics  . Smoking status: Former Smoker    Quit date: 10/01/2002  . Smokeless tobacco: Never Used  . Alcohol Use: No     Review of Systems  Constitutional: Negative.   Eyes: Negative.   Respiratory: Negative.   Cardiovascular: Negative.   Gastrointestinal: Negative.   Genitourinary: Positive for frequency. Negative for dysuria, urgency, hematuria and flank pain.  Musculoskeletal: Positive for myalgias, back pain, joint pain and neck pain. Negative for falls.       Left hip pain  Skin: Negative.   Neurological: Negative.   Endo/Heme/Allergies: Negative.   Psychiatric/Behavioral: Negative.     Objective:  Physical Exam  Constitutional: She is oriented to person, place, and time. She appears well-developed. No distress.  Morbidly obese  HENT:  Head: Normocephalic and atraumatic.  Right Ear: External ear normal.  Left Ear: External ear normal.  Nose: Nose normal.  Mouth/Throat: Oropharynx is clear and moist.  Eyes: Conjunctivae and EOM are normal.  Neck: Normal range of motion. Neck supple.  Cardiovascular: Normal rate, regular rhythm, normal heart sounds and intact distal pulses.   No murmur heard. Respiratory:  Effort normal and breath sounds normal. No respiratory distress. She has no wheezes.  GI: Soft. Bowel sounds are normal. She exhibits no distension. There is no tenderness.  Musculoskeletal:       Right knee: Normal.       Left knee: Normal.  Left hip can be flexed to about 90, no internal rotation, about 10 degrees of external rotation, 20 degrees of abduction. Right hip has excellent motion, but no discomfort.  Neurological: She is alert and oriented to person, place, and time. She has normal strength and normal reflexes. No sensory  deficit.  Skin: No rash noted. She is not diaphoretic. No erythema.  Psychiatric: She has a normal mood and affect. Her behavior is normal.    Vitals  Weight: 289 lb Height: 67in Body Surface Area: 2.36 m Body Mass Index: 45.26 kg/m  Pulse: 84 (Regular)  BP: 128/70 (Sitting, Left Arm, Standard)  Imaging Review Plain radiographs demonstrate severe degenerative joint disease of the left hip(s). The bone quality appears to be good for age and reported activity level.  Assessment/Plan:  End stage primary osteoarthritis, left hip(s)  The patient history, physical examination, clinical judgement of the provider and imaging studies are consistent with end stage degenerative joint disease of the left hip(s) and total hip arthroplasty is deemed medically necessary. The treatment options including medical management, injection therapy, arthroscopy and arthroplasty were discussed at length. The risks and benefits of total hip arthroplasty were presented and reviewed. The risks due to aseptic loosening, infection, stiffness, dislocation/subluxation,  thromboembolic complications and other imponderables were discussed.  The patient acknowledged the explanation, agreed to proceed with the plan and consent was signed. Patient is being admitted for inpatient treatment for surgery, pain control, PT, OT, prophylactic antibiotics, VTE prophylaxis, progressive ambulation and ADL's and discharge planning.The patient is planning to be discharged home with home health services    Wants hospital bed PCP: Dr. Carol Ada     Ardeen Jourdain, PA-C

## 2015-12-01 NOTE — Anesthesia Preprocedure Evaluation (Signed)
Anesthesia Evaluation  Patient identified by MRN, date of birth, ID band Patient awake    Reviewed: Allergy & Precautions, NPO status , Patient's Chart, lab work & pertinent test results  Airway Mallampati: II  TM Distance: >3 FB Neck ROM: Full    Dental no notable dental hx.    Pulmonary former smoker,    Pulmonary exam normal breath sounds clear to auscultation       Cardiovascular hypertension, Pt. on medications Normal cardiovascular exam Rhythm:Regular Rate:Normal     Neuro/Psych negative neurological ROS  negative psych ROS   GI/Hepatic negative GI ROS, Neg liver ROS,   Endo/Other  diabetes, Type 2Morbid obesity  Renal/GU negative Renal ROS     Musculoskeletal  (+) Arthritis ,   Abdominal (+) + obese,   Peds  Hematology negative hematology ROS (+)   Anesthesia Other Findings   Reproductive/Obstetrics negative OB ROS                             Anesthesia Physical Anesthesia Plan  ASA: III  Anesthesia Plan:    Post-op Pain Management:    Induction: Intravenous  Airway Management Planned:   Additional Equipment:   Intra-op Plan:   Post-operative Plan:   Informed Consent: I have reviewed the patients History and Physical, chart, labs and discussed the procedure including the risks, benefits and alternatives for the proposed anesthesia with the patient or authorized representative who has indicated his/her understanding and acceptance.   Dental advisory given  Plan Discussed with: CRNA  Anesthesia Plan Comments:         Anesthesia Quick Evaluation

## 2015-12-02 ENCOUNTER — Encounter (HOSPITAL_COMMUNITY): Payer: Self-pay | Admitting: *Deleted

## 2015-12-02 ENCOUNTER — Inpatient Hospital Stay (HOSPITAL_COMMUNITY): Payer: 59 | Admitting: Certified Registered Nurse Anesthetist

## 2015-12-02 ENCOUNTER — Encounter (HOSPITAL_COMMUNITY)
Admission: RE | Disposition: A | Discharge status: Home Health | Payer: Self-pay | Source: Ambulatory Visit | Attending: Orthopedic Surgery

## 2015-12-02 ENCOUNTER — Inpatient Hospital Stay (HOSPITAL_COMMUNITY)
Admission: RE | Admit: 2015-12-02 | Discharge: 2015-12-03 | DRG: 470 | Disposition: A | Discharge status: Home Health | Payer: 59 | Source: Ambulatory Visit | Attending: Orthopedic Surgery | Admitting: Orthopedic Surgery

## 2015-12-02 ENCOUNTER — Inpatient Hospital Stay (HOSPITAL_COMMUNITY): Payer: 59

## 2015-12-02 DIAGNOSIS — M1612 Unilateral primary osteoarthritis, left hip: Secondary | ICD-10-CM | POA: Diagnosis present

## 2015-12-02 DIAGNOSIS — Z79899 Other long term (current) drug therapy: Secondary | ICD-10-CM

## 2015-12-02 DIAGNOSIS — M169 Osteoarthritis of hip, unspecified: Secondary | ICD-10-CM | POA: Diagnosis present

## 2015-12-02 DIAGNOSIS — Z96641 Presence of right artificial hip joint: Secondary | ICD-10-CM | POA: Diagnosis present

## 2015-12-02 DIAGNOSIS — Z87891 Personal history of nicotine dependence: Secondary | ICD-10-CM | POA: Diagnosis not present

## 2015-12-02 DIAGNOSIS — I10 Essential (primary) hypertension: Secondary | ICD-10-CM | POA: Diagnosis present

## 2015-12-02 DIAGNOSIS — Z6841 Body Mass Index (BMI) 40.0 and over, adult: Secondary | ICD-10-CM

## 2015-12-02 DIAGNOSIS — E119 Type 2 diabetes mellitus without complications: Secondary | ICD-10-CM | POA: Diagnosis present

## 2015-12-02 DIAGNOSIS — Z96649 Presence of unspecified artificial hip joint: Secondary | ICD-10-CM

## 2015-12-02 DIAGNOSIS — M25552 Pain in left hip: Secondary | ICD-10-CM | POA: Diagnosis present

## 2015-12-02 HISTORY — PX: TOTAL HIP ARTHROPLASTY: SHX124

## 2015-12-02 LAB — GLUCOSE, CAPILLARY
GLUCOSE-CAPILLARY: 118 mg/dL — AB (ref 65–99)
GLUCOSE-CAPILLARY: 96 mg/dL (ref 65–99)
GLUCOSE-CAPILLARY: 263 mg/dL — AB (ref 65–99)
GLUCOSE-CAPILLARY: 198 mg/dL — AB (ref 65–99)

## 2015-12-02 LAB — TYPE AND SCREEN
ABO/RH(D): A POS
ANTIBODY SCREEN: NEGATIVE

## 2015-12-02 SURGERY — ARTHROPLASTY, HIP, TOTAL, ANTERIOR APPROACH
Anesthesia: Spinal | Site: Hip | Laterality: Left

## 2015-12-02 MED ORDER — FUROSEMIDE 20 MG PO TABS
20.0000 mg | ORAL_TABLET | Freq: Two times a day (BID) | ORAL | Status: DC
  Administered 2015-12-03: 20 mg via ORAL
  Filled 2015-12-02 (×4): qty 1

## 2015-12-02 MED ORDER — SODIUM CHLORIDE 0.9 % IJ SOLN
INTRAMUSCULAR | Status: AC
  Filled 2015-12-02: qty 10

## 2015-12-02 MED ORDER — PROPOFOL 500 MG/50ML IV EMUL
INTRAVENOUS | Status: DC | PRN
  Administered 2015-12-02: 25 ug/kg/min via INTRAVENOUS

## 2015-12-02 MED ORDER — ACETAMINOPHEN 325 MG PO TABS: 650.0000 mg | ORAL_TABLET | Freq: Four times a day (QID) | ORAL | Status: DC | PRN

## 2015-12-02 MED ORDER — MIDAZOLAM HCL 2 MG/2ML IJ SOLN
INTRAMUSCULAR | Status: AC
  Filled 2015-12-02: qty 2

## 2015-12-02 MED ORDER — MIDAZOLAM HCL 5 MG/5ML IJ SOLN
INTRAMUSCULAR | Status: DC | PRN
  Administered 2015-12-02 (×2): 1 mg via INTRAVENOUS

## 2015-12-02 MED ORDER — CEFAZOLIN SODIUM-DEXTROSE 2-4 GM/100ML-% IV SOLN
2.0000 g | Freq: Four times a day (QID) | INTRAVENOUS | Status: AC
  Administered 2015-12-02 (×2): 2 g via INTRAVENOUS
  Filled 2015-12-02 (×2): qty 100

## 2015-12-02 MED ORDER — LOSARTAN POTASSIUM 50 MG PO TABS
100.0000 mg | ORAL_TABLET | Freq: Every morning | ORAL | Status: DC
  Administered 2015-12-03: 100 mg via ORAL
  Filled 2015-12-02 (×2): qty 2

## 2015-12-02 MED ORDER — CEFAZOLIN SODIUM 1-5 GM-% IV SOLN
1.0000 g | Freq: Once | INTRAVENOUS | Status: AC
  Administered 2015-12-02: 2 g via INTRAVENOUS
  Administered 2015-12-02: 1 g via INTRAVENOUS
  Filled 2015-12-02: qty 50

## 2015-12-02 MED ORDER — PHENYLEPHRINE HCL 10 MG/ML IJ SOLN
INTRAMUSCULAR | Status: DC | PRN
  Administered 2015-12-02: 40 ug via INTRAVENOUS
  Administered 2015-12-02 (×9): 80 ug via INTRAVENOUS
  Administered 2015-12-02: 120 ug via INTRAVENOUS
  Administered 2015-12-02: 40 ug via INTRAVENOUS

## 2015-12-02 MED ORDER — EPHEDRINE SULFATE 50 MG/ML IJ SOLN
INTRAMUSCULAR | Status: AC
  Filled 2015-12-02: qty 1

## 2015-12-02 MED ORDER — DEXAMETHASONE SODIUM PHOSPHATE 10 MG/ML IJ SOLN
INTRAMUSCULAR | Status: AC
  Filled 2015-12-02: qty 1

## 2015-12-02 MED ORDER — FENTANYL CITRATE (PF) 100 MCG/2ML IJ SOLN
INTRAMUSCULAR | Status: AC
  Filled 2015-12-02: qty 2

## 2015-12-02 MED ORDER — FLEET ENEMA 7-19 GM/118ML RE ENEM: 1.0000 | ENEMA | Freq: Once | RECTAL | Status: DC | PRN

## 2015-12-02 MED ORDER — MORPHINE SULFATE (PF) 2 MG/ML IV SOLN
INTRAVENOUS | Status: AC
  Filled 2015-12-02: qty 1

## 2015-12-02 MED ORDER — OXYCODONE HCL 5 MG PO TABS
5.0000 mg | ORAL_TABLET | ORAL | Status: DC | PRN
  Administered 2015-12-02 – 2015-12-03 (×8): 10 mg via ORAL
  Filled 2015-12-02 (×8): qty 2

## 2015-12-02 MED ORDER — MORPHINE SULFATE (PF) 10 MG/ML IV SOLN: 1.0000 mg | INTRAVENOUS | Status: DC | PRN

## 2015-12-02 MED ORDER — ACETAMINOPHEN 10 MG/ML IV SOLN
INTRAVENOUS | Status: AC
  Filled 2015-12-02: qty 100

## 2015-12-02 MED ORDER — DEXAMETHASONE SODIUM PHOSPHATE 10 MG/ML IJ SOLN
10.0000 mg | Freq: Once | INTRAMUSCULAR | Status: DC
  Filled 2015-12-02: qty 1

## 2015-12-02 MED ORDER — RIVAROXABAN 10 MG PO TABS
10.0000 mg | ORAL_TABLET | Freq: Every day | ORAL | Status: DC
  Administered 2015-12-03: 10 mg via ORAL
  Filled 2015-12-02 (×2): qty 1

## 2015-12-02 MED ORDER — TRANEXAMIC ACID 1000 MG/10ML IV SOLN
1000.0000 mg | Freq: Once | INTRAVENOUS | Status: AC
  Administered 2015-12-02: 1000 mg via INTRAVENOUS
  Filled 2015-12-02: qty 10

## 2015-12-02 MED ORDER — LACTATED RINGERS IV SOLN
INTRAVENOUS | Status: DC | PRN
  Administered 2015-12-02 (×2): via INTRAVENOUS

## 2015-12-02 MED ORDER — ACETAMINOPHEN 500 MG PO TABS
1000.0000 mg | ORAL_TABLET | Freq: Four times a day (QID) | ORAL | Status: AC
  Administered 2015-12-02 – 2015-12-03 (×4): 1000 mg via ORAL
  Filled 2015-12-02 (×4): qty 2

## 2015-12-02 MED ORDER — ONDANSETRON HCL 4 MG/2ML IJ SOLN: 4.0000 mg | Freq: Four times a day (QID) | INTRAMUSCULAR | Status: DC | PRN

## 2015-12-02 MED ORDER — PROPOFOL 10 MG/ML IV BOLUS
INTRAVENOUS | Status: AC
  Filled 2015-12-02: qty 40

## 2015-12-02 MED ORDER — AMLODIPINE BESYLATE 5 MG PO TABS
5.0000 mg | ORAL_TABLET | Freq: Every day | ORAL | Status: DC
  Administered 2015-12-03: 5 mg via ORAL
  Filled 2015-12-02: qty 1

## 2015-12-02 MED ORDER — CEFAZOLIN SODIUM-DEXTROSE 2-3 GM-% IV SOLR
INTRAVENOUS | Status: AC
  Filled 2015-12-02: qty 50

## 2015-12-02 MED ORDER — FENTANYL CITRATE (PF) 100 MCG/2ML IJ SOLN
INTRAMUSCULAR | Status: DC | PRN
  Administered 2015-12-02: 25 ug via INTRAVENOUS
  Administered 2015-12-02: 50 ug via INTRAVENOUS
  Administered 2015-12-02: 25 ug via INTRAVENOUS

## 2015-12-02 MED ORDER — SODIUM CHLORIDE 0.9 % IV SOLN
INTRAVENOUS | Status: DC
  Administered 2015-12-02: 75 mL/h via INTRAVENOUS
  Administered 2015-12-02: 21:00:00 via INTRAVENOUS

## 2015-12-02 MED ORDER — ONDANSETRON HCL 4 MG PO TABS
4.0000 mg | ORAL_TABLET | Freq: Four times a day (QID) | ORAL | Status: DC | PRN
  Administered 2015-12-02: 4 mg via ORAL
  Filled 2015-12-02: qty 1

## 2015-12-02 MED ORDER — SODIUM CHLORIDE 0.9 % IV SOLN: INTRAVENOUS | Status: DC

## 2015-12-02 MED ORDER — GLIMEPIRIDE 2 MG PO TABS
2.0000 mg | ORAL_TABLET | Freq: Two times a day (BID) | ORAL | Status: DC
  Administered 2015-12-03: 2 mg via ORAL
  Filled 2015-12-02 (×2): qty 1

## 2015-12-02 MED ORDER — BUPIVACAINE IN DEXTROSE 0.75-8.25 % IT SOLN
INTRATHECAL | Status: DC | PRN
  Administered 2015-12-02: 2 mL via INTRATHECAL

## 2015-12-02 MED ORDER — METHOCARBAMOL 500 MG PO TABS
500.0000 mg | ORAL_TABLET | Freq: Four times a day (QID) | ORAL | Status: DC | PRN
  Administered 2015-12-02 – 2015-12-03 (×4): 500 mg via ORAL
  Filled 2015-12-02 (×4): qty 1

## 2015-12-02 MED ORDER — ONDANSETRON HCL 4 MG/2ML IJ SOLN
INTRAMUSCULAR | Status: DC | PRN
  Administered 2015-12-02: 4 mg via INTRAVENOUS

## 2015-12-02 MED ORDER — PHENYLEPHRINE 40 MCG/ML (10ML) SYRINGE FOR IV PUSH (FOR BLOOD PRESSURE SUPPORT)
PREFILLED_SYRINGE | INTRAVENOUS | Status: AC
  Filled 2015-12-02: qty 20

## 2015-12-02 MED ORDER — LIDOCAINE HCL (CARDIAC) 20 MG/ML IV SOLN
INTRAVENOUS | Status: DC | PRN
  Administered 2015-12-02: 60 mg via INTRAVENOUS

## 2015-12-02 MED ORDER — CHLORHEXIDINE GLUCONATE 4 % EX LIQD: 60.0000 mL | Freq: Once | CUTANEOUS | Status: DC

## 2015-12-02 MED ORDER — PROPOFOL 10 MG/ML IV BOLUS
INTRAVENOUS | Status: AC
  Filled 2015-12-02: qty 20

## 2015-12-02 MED ORDER — ACETAMINOPHEN 650 MG RE SUPP: 650.0000 mg | Freq: Four times a day (QID) | RECTAL | Status: DC | PRN

## 2015-12-02 MED ORDER — HYDROMORPHONE HCL 1 MG/ML IJ SOLN
INTRAMUSCULAR | Status: AC
  Filled 2015-12-02: qty 1

## 2015-12-02 MED ORDER — METHOCARBAMOL 1000 MG/10ML IJ SOLN
500.0000 mg | Freq: Four times a day (QID) | INTRAVENOUS | Status: DC | PRN
  Administered 2015-12-02: 500 mg via INTRAVENOUS
  Filled 2015-12-02 (×2): qty 5

## 2015-12-02 MED ORDER — TRAMADOL HCL 50 MG PO TABS: 50.0000 mg | ORAL_TABLET | Freq: Four times a day (QID) | ORAL | Status: DC | PRN

## 2015-12-02 MED ORDER — BISACODYL 10 MG RE SUPP: 10.0000 mg | Freq: Every day | RECTAL | Status: DC | PRN

## 2015-12-02 MED ORDER — DOCUSATE SODIUM 100 MG PO CAPS
100.0000 mg | ORAL_CAPSULE | Freq: Two times a day (BID) | ORAL | Status: DC
  Administered 2015-12-02 – 2015-12-03 (×3): 100 mg via ORAL

## 2015-12-02 MED ORDER — MEPERIDINE HCL 50 MG/ML IJ SOLN: 6.2500 mg | INTRAMUSCULAR | Status: DC | PRN

## 2015-12-02 MED ORDER — POLYETHYLENE GLYCOL 3350 17 G PO PACK: 17.0000 g | PACK | Freq: Every day | ORAL | Status: DC | PRN

## 2015-12-02 MED ORDER — TRANEXAMIC ACID 1000 MG/10ML IV SOLN
1000.0000 mg | INTRAVENOUS | Status: AC
  Administered 2015-12-02: 1000 mg via INTRAVENOUS
  Filled 2015-12-02: qty 10

## 2015-12-02 MED ORDER — MENTHOL 3 MG MT LOZG: 1.0000 | LOZENGE | OROMUCOSAL | Status: DC | PRN

## 2015-12-02 MED ORDER — ATORVASTATIN CALCIUM 20 MG PO TABS
20.0000 mg | ORAL_TABLET | Freq: Every day | ORAL | Status: DC
  Administered 2015-12-03: 20 mg via ORAL
  Filled 2015-12-02: qty 1

## 2015-12-02 MED ORDER — ONDANSETRON HCL 4 MG/2ML IJ SOLN
INTRAMUSCULAR | Status: AC
  Filled 2015-12-02: qty 2

## 2015-12-02 MED ORDER — HYDROMORPHONE HCL 1 MG/ML IJ SOLN
0.2500 mg | INTRAMUSCULAR | Status: DC | PRN
  Administered 2015-12-02 (×2): 0.25 mg via INTRAVENOUS

## 2015-12-02 MED ORDER — BUPIVACAINE HCL (PF) 0.25 % IJ SOLN
INTRAMUSCULAR | Status: DC | PRN
  Administered 2015-12-02: 30 mL

## 2015-12-02 MED ORDER — PHENOL 1.4 % MT LIQD
1.0000 | OROMUCOSAL | Status: DC | PRN
  Filled 2015-12-02: qty 177

## 2015-12-02 MED ORDER — DEXAMETHASONE SODIUM PHOSPHATE 10 MG/ML IJ SOLN
10.0000 mg | Freq: Once | INTRAMUSCULAR | Status: AC
  Administered 2015-12-02: 10 mg via INTRAVENOUS

## 2015-12-02 MED ORDER — METFORMIN HCL 500 MG PO TABS
1000.0000 mg | ORAL_TABLET | Freq: Two times a day (BID) | ORAL | Status: DC
  Filled 2015-12-02 (×3): qty 2

## 2015-12-02 MED ORDER — ACETAMINOPHEN 10 MG/ML IV SOLN
1000.0000 mg | Freq: Once | INTRAVENOUS | Status: AC
  Administered 2015-12-02: 1000 mg via INTRAVENOUS
  Filled 2015-12-02: qty 100

## 2015-12-02 MED ORDER — METOCLOPRAMIDE HCL 5 MG PO TABS
5.0000 mg | ORAL_TABLET | Freq: Three times a day (TID) | ORAL | Status: DC | PRN
  Filled 2015-12-02: qty 2

## 2015-12-02 MED ORDER — INSULIN ASPART 100 UNIT/ML ~~LOC~~ SOLN: 0.0000 [IU] | Freq: Three times a day (TID) | SUBCUTANEOUS | Status: DC

## 2015-12-02 MED ORDER — MORPHINE SULFATE (PF) 2 MG/ML IV SOLN
1.0000 mg | INTRAVENOUS | Status: DC | PRN
  Administered 2015-12-02: 2 mg via INTRAVENOUS

## 2015-12-02 MED ORDER — DIPHENHYDRAMINE HCL 12.5 MG/5ML PO ELIX: 12.5000 mg | ORAL_SOLUTION | ORAL | Status: DC | PRN

## 2015-12-02 MED ORDER — METOCLOPRAMIDE HCL 5 MG/ML IJ SOLN: 5.0000 mg | Freq: Three times a day (TID) | INTRAMUSCULAR | Status: DC | PRN

## 2015-12-02 MED ORDER — BUPIVACAINE HCL (PF) 0.25 % IJ SOLN
INTRAMUSCULAR | Status: AC
  Filled 2015-12-02: qty 30

## 2015-12-02 MED ORDER — LACTATED RINGERS IV SOLN
INTRAVENOUS | Status: DC
  Administered 2015-12-02: 11:00:00 via INTRAVENOUS

## 2015-12-02 MED ORDER — SODIUM CHLORIDE 0.9 % IJ SOLN
INTRAMUSCULAR | Status: AC
  Filled 2015-12-02: qty 50

## 2015-12-02 SURGICAL SUPPLY — 37 items
BAG DECANTER FOR FLEXI CONT (MISCELLANEOUS) ×1 IMPLANT
BAG SPEC THK2 15X12 ZIP CLS (MISCELLANEOUS)
BAG ZIPLOCK 12X15 (MISCELLANEOUS) IMPLANT
BLADE SAG 18X100X1.27 (BLADE) ×3 IMPLANT
CAPT HIP TOTAL 2 ×2 IMPLANT
CLOSURE WOUND 1/2 X4 (GAUZE/BANDAGES/DRESSINGS) ×2
CLOTH BEACON ORANGE TIMEOUT ST (SAFETY) ×3 IMPLANT
COVER PERINEAL POST (MISCELLANEOUS) ×3 IMPLANT
DECANTER SPIKE VIAL GLASS SM (MISCELLANEOUS) ×3 IMPLANT
DRAPE STERI IOBAN 125X83 (DRAPES) ×3 IMPLANT
DRAPE U-SHAPE 47X51 STRL (DRAPES) ×6 IMPLANT
DRSG ADAPTIC 3X8 NADH LF (GAUZE/BANDAGES/DRESSINGS) ×3 IMPLANT
DRSG MEPILEX BORDER 4X4 (GAUZE/BANDAGES/DRESSINGS) ×3 IMPLANT
DRSG MEPILEX BORDER 4X8 (GAUZE/BANDAGES/DRESSINGS) ×3 IMPLANT
DURAPREP 26ML APPLICATOR (WOUND CARE) ×3 IMPLANT
ELECT REM PT RETURN 9FT ADLT (ELECTROSURGICAL) ×3
ELECTRODE REM PT RTRN 9FT ADLT (ELECTROSURGICAL) ×1 IMPLANT
EVACUATOR 1/8 PVC DRAIN (DRAIN) ×3 IMPLANT
GLOVE BIO SURGEON STRL SZ7.5 (GLOVE) ×3 IMPLANT
GLOVE BIO SURGEON STRL SZ8 (GLOVE) ×6 IMPLANT
GLOVE BIOGEL PI IND STRL 7.0 (GLOVE) IMPLANT
GLOVE BIOGEL PI IND STRL 8 (GLOVE) ×2 IMPLANT
GLOVE BIOGEL PI INDICATOR 7.0 (GLOVE) ×2
GLOVE BIOGEL PI INDICATOR 8 (GLOVE) ×6
GOWN STRL REUS W/TWL LRG LVL3 (GOWN DISPOSABLE) ×7 IMPLANT
GOWN STRL REUS W/TWL XL LVL3 (GOWN DISPOSABLE) ×3 IMPLANT
PACK ANTERIOR HIP CUSTOM (KITS) ×3 IMPLANT
STRIP CLOSURE SKIN 1/2X4 (GAUZE/BANDAGES/DRESSINGS) ×3 IMPLANT
SUT ETHIBOND NAB CT1 #1 30IN (SUTURE) ×3 IMPLANT
SUT MNCRL AB 4-0 PS2 18 (SUTURE) ×3 IMPLANT
SUT VIC AB 2-0 CT1 27 (SUTURE) ×9
SUT VIC AB 2-0 CT1 TAPERPNT 27 (SUTURE) ×2 IMPLANT
SUT VLOC 180 0 24IN GS25 (SUTURE) ×5 IMPLANT
SYR 50ML LL SCALE MARK (SYRINGE) IMPLANT
TRAY FOLEY W/METER SILVER 14FR (SET/KITS/TRAYS/PACK) ×3 IMPLANT
TRAY FOLEY W/METER SILVER 16FR (SET/KITS/TRAYS/PACK) ×1 IMPLANT
YANKAUER SUCT BULB TIP 10FT TU (MISCELLANEOUS) ×3 IMPLANT

## 2015-12-02 NOTE — Interval H&P Note (Signed)
History and Physical Interval Note:  12/02/2015 6:42 AM  Laura Blackwell  has presented today for surgery, with the diagnosis of OA LEFT HIP  The various methods of treatment have been discussed with the patient and family. After consideration of risks, benefits and other options for treatment, the patient has consented to  Procedure(s): LEFT TOTAL HIP ARTHROPLASTY ANTERIOR APPROACH (Left) as a surgical intervention .  The patient's history has been reviewed, patient examined, no change in status, stable for surgery.  I have reviewed the patient's chart and labs.  Questions were answered to the patient's satisfaction.     Gearlean Alf

## 2015-12-02 NOTE — Transfer of Care (Signed)
Immediate Anesthesia Transfer of Care Note  Patient: Laura Blackwell  Procedure(s) Performed: Procedure(s): LEFT TOTAL HIP ARTHROPLASTY ANTERIOR APPROACH (Left)  Patient Location: PACU  Anesthesia Type:Spinal  Level of Consciousness:  sedated, patient cooperative and responds to stimulation  Airway & Oxygen Therapy:Patient Spontanous Breathing and Patient connected to face mask oxgen  Post-op Assessment:  Report given to PACU RN and Post -op Vital signs reviewed and stable  Post vital signs:  Reviewed and stable  Last Vitals:  Filed Vitals:   12/02/15 0528 12/02/15 0556  BP: 132/101 121/75  Pulse: 97   Temp: 36.7 C   Resp: 18     Complications: No apparent anesthesia complications

## 2015-12-02 NOTE — Anesthesia Procedure Notes (Addendum)
Spinal Patient location during procedure: OR Start time: 12/02/2015 7:10 AM End time: 12/02/2015 7:18 AM Reason for block: at surgeon's request Staffing Resident/CRNA: Christell Faith L Performed by: resident/CRNA  Preanesthetic Checklist Completed: patient identified, site marked, surgical consent, pre-op evaluation, timeout performed, IV checked, risks and benefits discussed, monitors and equipment checked and at surgeon's request Spinal Block Patient position: sitting Prep: ChloraPrep Patient monitoring: heart rate, continuous pulse ox and blood pressure Approach: midline Location: L3-4 Injection technique: single-shot Needle Needle type: [place] and Spinocan  Needle gauge: 25 G Needle length: 9 cm Assessment Sensory level: T4 Additional Notes Expiration of kit checked and confirmed. Patient tolerated procedure well,without complications x 1 attempt with noted clear CSF. Loss of motor and sensory on exam post injection.  Procedure Name: MAC Date/Time: 12/02/2015 7:10 AM Performed by: West Pugh Pre-anesthesia Checklist: Patient identified, Timeout performed, Emergency Drugs available, Suction available and Patient being monitored Patient Re-evaluated:Patient Re-evaluated prior to inductionOxygen Delivery Method: Simple face mask

## 2015-12-02 NOTE — Op Note (Signed)
OPERATIVE REPORT  PREOPERATIVE DIAGNOSIS: Osteoarthritis of the Left hip.   POSTOPERATIVE DIAGNOSIS: Osteoarthritis of the Left  hip.   PROCEDURE: Left total hip arthroplasty, anterior approach.   SURGEON: [name], MD   ASSISTANT: Arlee Muslim, PA-C  ANESTHESIA:  Spinal  ESTIMATED BLOOD LOSS:-350 ml   DRAINS: Hemovac x1.   COMPLICATIONS: None   CONDITION: PACU - hemodynamically stable.   BRIEF CLINICAL NOTE: Laura Blackwell is a 60 y.o. female who has advanced end-  stage arthritis of her Left  hip with progressively worsening pain and  dysfunction.The patient has failed nonoperative management and presents for  total hip arthroplasty.   PROCEDURE IN DETAIL: After successful administration of spinal  anesthetic, the traction boots for the [facility] bed were placed on both  feet and the patient was placed onto the [facility] bed, boots placed into the leg  holders. The Left hip was then isolated from the perineum with plastic  drapes and prepped and draped in the usual sterile fashion. ASIS and  greater trochanter were marked and a oblique incision was made, starting  at about 1 cm lateral and 2 cm distal to the ASIS and coursing towards  the anterior cortex of the femur. The skin was cut with a 10 blade  through subcutaneous tissue to the level of the fascia overlying the  tensor fascia lata muscle. The fascia was then incised in line with the  incision at the junction of the anterior third and posterior 2/3rd. The  muscle was teased off the fascia and then the interval between the TFL  and the rectus was developed. The Hohmann retractor was then placed at  the top of the femoral neck over the capsule. The vessels overlying the  capsule were cauterized and the fat on top of the capsule was removed.  A Hohmann retractor was then placed anterior underneath the rectus  femoris to give exposure to the entire anterior capsule. A T-shaped  capsulotomy was  performed. The edges were tagged and the femoral head  was identified.       Osteophytes are removed off the superior acetabulum.  The femoral neck was then cut in situ with an oscillating saw. Traction  was then applied to the left lower extremity utilizing the [facility]  traction. The femoral head was then removed. Retractors were placed  around the acetabulum and then circumferential removal of the labrum was  performed. Osteophytes were also removed. Reaming starts at 45 mm to  medialize and  Increased in 2 mm increments to 49 mm. We reamed in  approximately 40 degrees of abduction, 20 degrees anteversion. A 50 mm  pinnacle acetabular shell was then impacted in anatomic position under  fluoroscopic guidance with excellent purchase. We did not need to place  any additional dome screws. A 32 mm neutral + 4 marathon liner was then  placed into the acetabular shell.       The femoral lift was then placed along the lateral aspect of the femur  just distal to the vastus ridge. The leg was  externally rotated and capsule  was stripped off the inferior aspect of the femoral neck down to the  level of the lesser trochanter, this was done with electrocautery. The femur was lifted after this was performed. The  leg was then placed and extended in adducted position to essentially delivering the femur. We also removed the capsule superiorly and the  piriformis from the piriformis  fossa to gain excellent exposure of the  proximal femur. Rongeur was used to remove some cancellous bone to get  into the lateral portion of the proximal femur for placement of the  initial starter reamer. The starter broaches was placed  the starter broach  and was shown to go down the center of the canal. Broaching  with the  Corail system was then performed starting at size 8, coursing  Up to size 10. A size 10 had excellent torsional and rotational  and axial stability. The trial standard offset neck was then placed  with a  32 + 1 trial head. The hip was then reduced. We confirmed that  the stem was in the canal both on AP and lateral x-rays. It also has excellent sizing. The hip was reduced with outstanding stability through full extension, full external rotation,  and then flexion in adduction internal rotation. AP pelvis was taken  and the leg lengths were measured and found to be exactly equal. Hip  was then dislocated again and the femoral head and neck removed. The  femoral broach was removed. Size 10 Corail stem with a standard offset  neck was then impacted into the femur following native anteversion. Has  excellent purchase in the canal. Excellent torsional and rotational and  axial stability. It is confirmed to be in the canal on AP and lateral  fluoroscopic views. The 32 + 1 ceramic head was placed and the hip  reduced with outstanding stability. Again AP pelvis was taken and it  confirmed that the leg lengths were equal. The wound was then copiously  irrigated with saline solution and the capsule reattached and repaired  with Ethibond suture. 30 ml of .25% Bupivicaine injected into the capsule and into the edge of the tensor fascia lata as well as subcutaneous tissue. The fascia overlying the tensor fascia lata was  then closed with a running #1 V-Loc. Subcu was closed with interrupted  2-0 Vicryl and subcuticular running 4-0 Monocryl. Incision was cleaned  and dried. Steri-Strips and a bulky sterile dressing applied. Hemovac  drain was hooked to suction and then he was awakened and transported to  recovery in stable condition.        Please note that a surgical assistant was a medical necessity for this procedure to perform it in a safe and expeditious manner. Assistant was necessary to provide appropriate retraction of vital neurovascular structures and to prevent femoral fracture and allow for anatomic placement of the prosthesis.  [name], M.D.

## 2015-12-02 NOTE — Anesthesia Postprocedure Evaluation (Signed)
Anesthesia Post Note  Patient: Laura Blackwell  Procedure(s) Performed: Procedure(s) (LRB): LEFT TOTAL HIP ARTHROPLASTY ANTERIOR APPROACH (Left)  Patient location during evaluation: PACU Anesthesia Type: Spinal and MAC Level of consciousness: awake and alert Pain management: pain level controlled Vital Signs Assessment: post-procedure vital signs reviewed and stable Respiratory status: spontaneous breathing and respiratory function stable Cardiovascular status: blood pressure returned to baseline and stable Postop Assessment: spinal receding Anesthetic complications: no    Last Vitals:  Filed Vitals:   12/02/15 1239 12/02/15 1335  BP: 118/64 113/68  Pulse: 87 86  Temp: 36.6 C 36.5 C  Resp: 16 15    Last Pain:  Filed Vitals:   12/02/15 1343  PainSc: [place]

## 2015-12-02 NOTE — Evaluation (Signed)
Physical Therapy Evaluation Patient Details Name: Laura Blackwell MRN: [id number] DOB: December 01, 1955 Today's Date: 12/02/2015   History of Present Illness  LDATHA  Clinical Impression  The patient is requiring assistance for bed mobility. Patient is requesting a hospital bed and 3-in1. States she had a Hospital bed after previous THA. Pt admitted with above diagnosis. Pt currently with functional limitations due to the deficits listed below (see PT Problem List).  Pt will benefit from skilled PT to increase their independence and safety with mobility to allow discharge to the home.     Follow Up Recommendations Home health PT;Supervision/Assistance - 24 hour    Equipment Recommendations  3in1 (PT)    Recommendations for Other Services       Precautions / Restrictions Precautions Precautions: Fall      Mobility  Bed Mobility Overal bed mobility: Needs Assistance Bed Mobility: Supine to Sit;Sit to Supine     Supine to sit: Max assist;HOB elevated Sit to supine: Max assist   General bed mobility comments: much assistance  for the L leg and trunk,   Transfers Overall transfer level: Needs assistance Equipment used: Rolling walker (2 wheeled) Transfers: Sit to/from Stand Sit to Stand: Min assist         General transfer comment: cues for hand placement and L leg position  Ambulation/Gait Ambulation/Gait assistance: Min assist Ambulation Distance (Feet): 60 Feet Assistive device: Rolling walker (2 wheeled) Gait Pattern/deviations: Step-to pattern;Step-through pattern     General Gait Details: cues for sequence  Stairs            Wheelchair Mobility    Modified Rankin (Stroke Patients Only)       Balance                                             Pertinent Vitals/Pain Pain Assessment: 0-10 Pain Score: 10-Worst pain ever Pain Location: L thigh and groin Pain Descriptors / Indicators:  Burning;Contraction;Cramping;Discomfort;Grimacing;Guarding Pain Intervention(s): Ice applied;Repositioned;Patient requesting pain meds-RN notified;Limited activity within patient's tolerance;Monitored during session    [place] expects to be discharged to:: Private residence Living Arrangements: Children Available Help at Discharge: Family Type of Home: House Home Access: Stairs to enter Entrance Stairs-Rails: Can reach both Entrance Stairs-Number of Steps: [place]: Two level;Bed/bath upstairs Home Equipment: Walker - 2 wheels Additional Comments: patient is requesting a hospital bed as she did 6 yrs ago.    Prior Function Level of Independence: Independent with assistive device(s)         Comments: cane     Hand Dominance        Extremity/Trunk Assessment   Upper Extremity Assessment: Defer to OT evaluation           Lower Extremity Assessment: LLE deficits/detail   LLE Deficits / Details: able to advance, requires assistance to lift onto bed  Cervical / Trunk Assessment: Normal  Communication   Communication: No difficulties  Cognition Arousal/Alertness: Awake/alert Behavior During Therapy: WFL for tasks assessed/performed Overall Cognitive Status: Within Functional Limits for tasks assessed                      General Comments      Exercises        Assessment/Plan    PT Assessment Patient needs continued PT services  PT Diagnosis Difficulty walking;Acute pain   PT Problem  List Decreased strength;Decreased range of motion;Decreased activity tolerance;Decreased mobility;Decreased knowledge of precautions;Decreased safety awareness;Decreased knowledge of use of DME;Pain  PT Treatment Interventions DME instruction;Gait training;Stair training;Functional mobility training;Therapeutic activities;Therapeutic exercise;Patient/family education   PT Goals (Current goals can be found in the Care Plan section) Acute Rehab PT  Goals Patient Stated Goal: to go home, not have pain PT Goal Formulation: With patient Time For Goal Achievement: 12/06/15 Potential to Achieve Goals: Good    Frequency 7X/week   Barriers to discharge        Co-evaluation               End of Session   Activity Tolerance: Patient tolerated treatment well Patient left: in bed;with call bell/phone within reach;with bed alarm set Nurse Communication: Mobility status;Patient requests pain meds         Time: 1451-1529 PT Time Calculation (min) (ACUTE ONLY): 38 min   Charges:   PT Evaluation $PT Eval Low Complexity: 1 Procedure PT Treatments $Gait Training: 8-22 mins $Self Care/Home Management: 8-22   PT G Codes:        Claretha Cooper 12/02/2015, 5:50 PM Tresa Endo PT [phone]

## 2015-12-03 LAB — CBC
HEMATOCRIT: 34.2 % — AB (ref 36.0–46.0)
HEMOGLOBIN: 11.4 g/dL — AB (ref 12.0–15.0)
MCH: 27.7 pg (ref 26.0–34.0)
MCHC: 33.3 g/dL (ref 30.0–36.0)
MCV: 83 fL (ref 78.0–100.0)
Platelets: 219 10*3/uL (ref 150–400)
RBC: 4.12 MIL/uL (ref 3.87–5.11)
RDW: 14.9 % (ref 11.5–15.5)
WBC: 12.8 10*3/uL — ABNORMAL HIGH (ref 4.0–10.5)

## 2015-12-03 LAB — BASIC METABOLIC PANEL
Anion gap: 7 (ref 5–15)
BUN: 14 mg/dL (ref 6–20)
CHLORIDE: 105 mmol/L (ref 101–111)
CO2: 27 mmol/L (ref 22–32)
Calcium: 9 mg/dL (ref 8.9–10.3)
Creatinine, Ser: 0.57 mg/dL (ref 0.44–1.00)
GFR calc non Af Amer: >60 mL/min (ref >60)
Glucose, Bld: 239 mg/dL — ABNORMAL HIGH (ref 65–99)
POTASSIUM: 4.5 mmol/L (ref 3.5–5.1)
SODIUM: 139 mmol/L (ref 135–145)

## 2015-12-03 LAB — GLUCOSE, CAPILLARY
GLUCOSE-CAPILLARY: 132 mg/dL — AB (ref 65–99)
Glucose-Capillary: 119 mg/dL — ABNORMAL HIGH (ref 65–99)

## 2015-12-03 MED ORDER — TRAMADOL HCL 50 MG PO TABS: 50.0000 mg | ORAL_TABLET | Freq: Four times a day (QID) | ORAL | Status: DC | PRN

## 2015-12-03 MED ORDER — RIVAROXABAN 10 MG PO TABS: 10.0000 mg | ORAL_TABLET | Freq: Every day | ORAL | Status: DC

## 2015-12-03 MED ORDER — METHOCARBAMOL 500 MG PO TABS: 500.0000 mg | ORAL_TABLET | Freq: Four times a day (QID) | ORAL | Status: DC | PRN

## 2015-12-03 MED ORDER — OXYCODONE HCL 5 MG PO TABS: 5.0000 mg | ORAL_TABLET | ORAL | Status: DC | PRN

## 2015-12-03 NOTE — Care Management Note (Signed)
Case Management Note  Patient Details  Name: NAVIKA HOOPES MRN: [id number] Date of Birth: 1956/04/08  Subjective/Objective:                  LEFT TOTAL HIP ARTHROPLASTY ANTERIOR APPROACH (Left) Action/Plan: Discharge planning Expected Discharge Date:                  Expected Discharge Plan:  [place]  In-House Referral:     Discharge planning Services  CM Consult  Post Acute Care Choice:  Home Health Choice offered to:  Patient  DME Arranged:  3-N-1, Hospital bed DME Agency:  [place]:  PT [place]:  [place]  Status of Service:  Completed, signed off  Medicare Important Message Given:    Date Medicare IM Given:    Medicare IM give by:    Date Additional Medicare IM Given:    Additional Medicare Important Message give by:     If discussed at [place] of Stay Meetings, dates discussed:    Additional Comments: CM met with pt in room to offer choice of home health agency.  Pt chooses [facility] to render HHPT. Referral given to [facility] rep, Tim (on unit).  Cm called [facility] DME rep, Lecretia to please arrange 3n1 and hospital bed to be delivered to pt's home so pt can discharge.  No other CM needs were communicated. Dellie Catholic, RN 12/03/2015, 12:38 PM

## 2015-12-03 NOTE — Progress Notes (Signed)
   Subjective: 1 Day Post-Op Procedure(s) (LRB): LEFT TOTAL HIP ARTHROPLASTY ANTERIOR APPROACH (Left) Patient reports pain as mild.   Patient seen in rounds with Dr. Wynelle Link. Sitting up in bed in good spirits. Patient is well, but has had some minor complaints of pain in the hip, requiring pain medications We will start therapy today.  If they do well with therapy and meets all goals, then will allow home later this afternoon following therapy. Plan is to go Home after hospital stay.  Objective: Vital signs in last 24 hours: Temp:  [94.8 F (34.9 C)-98.7 F (37.1 C)] 98.1 F (36.7 C) (04/04 0419) Pulse Rate:  [61-101] 85 (04/04 0419) Resp:  [10-20] 16 (04/04 0419) BP: (66-138)/(24-87) 118/68 mmHg (04/04 0419) SpO2:  [95 %-100 %] 100 % (04/04 0419) Weight:  [134.265 kg (296 lb)] 134.265 kg (296 lb) (04/03 1136)  Intake/Output from previous day:  Intake/Output Summary (Last 24 hours) at 12/03/15 0816 Last data filed at 12/03/15 0600  Gross per 24 hour  Intake   3840 ml  Output   4088 ml  Net   -248 ml    Labs:  Recent Labs  12/03/15 0354  HGB 11.4*    Recent Labs  12/03/15 0354  WBC 12.8*  RBC 4.12  HCT 34.2*  PLT 219    Recent Labs  12/03/15 0354  NA 139  K 4.5  CL 105  CO2 27  BUN 14  CREATININE 0.57  GLUCOSE 239*  CALCIUM 9.0   No results for input(s): LABPT, INR in the last 72 hours.  EXAM General - Patient is Alert, Appropriate and Oriented Extremity - Neurovascular intact Sensation intact distally Dorsiflexion/Plantar flexion intact Dressing - dressing C/D/I Motor Function - intact, moving foot and toes well on exam.  Hemovac pulled without difficulty.  Past Medical History  Diagnosis Date  . Diabetes mellitus without complication ([place])   . Hypertension   . Edema     in left leg in 1980s on left ankle   . Arthritis     Assessment/Plan: 1 Day Post-Op Procedure(s) (LRB): LEFT TOTAL HIP ARTHROPLASTY ANTERIOR APPROACH (Left) Principal  Problem:   OA (osteoarthritis) of hip  Estimated body mass index is 46.35 kg/(m^2) as calculated from the following:   Height as of this encounter: 5\' 7"  (1.702 m).   Weight as of this encounter: 134.265 kg (296 lb). Advance diet Up with therapy Discharge home with home health  DVT Prophylaxis - Xarelto Weight Bearing As Tolerated left Leg Hemovac Pulled Begin Therapy  If meets goals and able to go home: Discharge home with home health Diet - Cardiac diet and Diabetic diet Follow up - in 2 weeks on Tuesday 4/18 Activity - WBAT Disposition - Home Condition Upon Discharge - pending D/C Meds - See DC Summary DVT Prophylaxis - Xarelto  Arlee Muslim, PA-C Orthopaedic Surgery 12/03/2015, 8:16 AM

## 2015-12-03 NOTE — Evaluation (Signed)
Occupational Therapy Evaluation Patient Details Name: Laura Blackwell MRN: [id number] DOB: 08/16/1956 Today's Date: 12/03/2015    History of Present Illness LDATHA   Clinical Impression   Patient presenting with decreased ADL and functional mobility independence secondary to above. Patient indepednent PTA. Patient currently functioning at an overall min to mod assist level. Patient will benefit from acute OT to increase overall independence in the areas of ADLs, functional mobility, and overall safety in order to safely discharge home with assistance from family.      Follow Up Recommendations  No OT follow up;Supervision/Assistance - 24 hour    Equipment Recommendations  3 in 1 bedside comode    Recommendations for Other Services  None at this time   Precautions / Restrictions Precautions Precautions: Fall Restrictions Weight Bearing Restrictions: Yes LLE Weight Bearing: Weight bearing as tolerated     Mobility Bed Mobility General bed mobility comments: pt found seated in recliner upon OT entering/exiting room   Transfers Overall transfer level: Needs assistance Equipment used: Rolling walker (2 wheeled) Transfers: Sit to/from Stand Sit to Stand: Min assist General transfer comment: cues for hand placement and L leg position    Balance Overall balance assessment: Needs assistance Sitting-balance support: No upper extremity supported;Feet supported Sitting balance-Leahy Scale: Fair Sitting balance - Comments: fair due to pain   Standing balance support: Bilateral upper extremity supported;During functional activity Standing balance-Leahy Scale: Fair Standing balance comment: heavy reliance on RW    ADL Overall ADL's : Needs assistance/impaired Eating/Feeding: Set up;Sitting   Grooming: Set up;Sitting   Upper Body Bathing: Minimal assitance;Sitting Upper Body Bathing Details (indicate cue type and reason): due to pain Lower Body Bathing: Moderate  assistance;Sit to/from stand   Upper Body Dressing : Minimal assistance;Sitting Upper Body Dressing Details (indicate cue type and reason): due to pain Lower Body Dressing: Maximal assistance;Sit to/from stand   Toilet Transfer: Min guard;RW;BSC;Grab bars;Ambulation     Toileting - Clothing Manipulation Details (indicate cue type and reason): did not occur  Tub/ Shower Transfer: Walk-in shower;Moderate assistance;Minimal assistance;Ambulation;Anterior/posterior;Rolling walker     General ADL Comments: Pt with diffculty advancing LLE during functional mobility and walk-in shower transfer. Would like to practice this with patient again tomorrow if she is still in hospital.     Vision Vision Assessment?: No apparent visual deficits          Pertinent Vitals/Pain Pain Assessment: 0-10 Pain Score: 10-Worst pain ever Pain Location: LLE Pain Descriptors / Indicators: Aching;Sore;Discomfort;Dull;Grimacing;Guarding Pain Intervention(s): Limited activity within patient's tolerance;Monitored during session;Repositioned     Hand Dominance Right   Extremity/Trunk Assessment Upper Extremity Assessment Upper Extremity Assessment: Generalized weakness   Lower Extremity Assessment Lower Extremity Assessment: Defer to PT evaluation   Cervical / Trunk Assessment Cervical / Trunk Assessment: Normal   Communication Communication Communication: No difficulties   Cognition Arousal/Alertness: Awake/alert Behavior During Therapy: WFL for tasks assessed/performed Overall Cognitive Status: Within Functional Limits for tasks assessed              Home Living Family/patient expects to be discharged to:: Private residence Living Arrangements: Children Available Help at Discharge: Family Type of Home: House Home Access: Stairs to enter Technical brewer of Steps: 3 Entrance Stairs-Rails: Can reach both Home Layout: Two level;Bed/bath upstairs     Bathroom Shower/Tub: Emergency planning/management officer: Standard     Home Equipment: Environmental consultant - 2 wheels   Additional Comments: patient is requesting a hospital bed as she did 6 yrs ago.  Prior Functioning/Environment Level of Independence: Independent with assistive device(s)        Comments: cane    OT Diagnosis: Generalized weakness;Acute pain   OT Problem List: Decreased strength;Decreased range of motion;Decreased activity tolerance;Impaired balance (sitting and/or standing);Decreased coordination;Decreased safety awareness;Decreased knowledge of use of DME or AE;Decreased knowledge of precautions;Pain   OT Treatment/Interventions: Self-care/ADL training;Therapeutic exercise;Energy conservation;DME and/or AE instruction;Therapeutic activities;Patient/family education;Balance training    OT Goals(Current goals can be found in the care plan section) Acute Rehab OT Goals Patient Stated Goal: to go home, not have pain OT Goal Formulation: With patient Time For Goal Achievement: 12/10/15 Potential to Achieve Goals: Good ADL Goals Pt Will Perform Grooming: standing;with modified independence Pt Will Perform Lower Body Bathing: with supervision;sit to/from stand;with adaptive equipment Pt Will Perform Lower Body Dressing: with supervision;sit to/from stand;with adaptive equipment Pt Will Transfer to Toilet: with modified independence;ambulating;bedside commode Pt Will Perform Tub/Shower Transfer: Shower transfer;ambulating;3 in 1;rolling walker;anterior/posterior transfer;with supervision  OT Frequency: Min 2X/week   Barriers to D/C: none known at this time   End of Session Equipment Utilized During Treatment: Rolling walker  Activity Tolerance: Patient tolerated treatment well;Patient limited by pain Patient left: in chair;with call bell/phone within reach;Other (comment) (with PT entering room)   Time: [id number] OT Time Calculation (min): 29 min Charges:  OT General Charges $OT Visit: 1  Procedure OT Evaluation $OT Eval Low Complexity: 1 Procedure OT Treatments $Self Care/Home Management : 8-22 mins  Chrys Racer , MS, OTR/L, CLT Pager: [phone]  12/03/2015, 1:11 PM

## 2015-12-03 NOTE — Discharge Summary (Signed)
Physician Discharge Summary   Patient ID: Laura Blackwell MRN: [id number] DOB/AGE: [birth year] 60 y.o.  Admit date: 12/02/2015 Discharge date: 12-03-2015  Primary Diagnosis:  Osteoarthritis of the Left hip.  Admission Diagnoses:  Past Medical History  Diagnosis Date  . Diabetes mellitus without complication ([place])   . Hypertension   . Edema     in left leg in 1980s on left ankle   . Arthritis    Discharge Diagnoses:   Principal Problem:   OA (osteoarthritis) of hip  Estimated body mass index is 46.35 kg/(m^2) as calculated from the following:   Height as of this encounter: 5' 7"  (1.702 m).   Weight as of this encounter: 134.265 kg (296 lb).  Procedure(s) (LRB): LEFT TOTAL HIP ARTHROPLASTY ANTERIOR APPROACH (Left)   Consults: None  HPI: Laura Blackwell is a 60 y.o. female who has advanced end-  stage arthritis of her Left hip with progressively worsening pain and  dysfunction.The patient has failed nonoperative management and presents for  total hip arthroplasty.  Laboratory Data: Admission on 12/02/2015  Component Date Value Ref Range Status  . Glucose-Capillary 12/02/2015 118* 65 - 99 mg/dL Final  . Comment 1 12/02/2015 Notify RN   Final  . Comment 2 12/02/2015 Document in Chart   Final  . Glucose-Capillary 12/02/2015 96  65 - 99 mg/dL Final  . WBC 12/03/2015 12.8* 4.0 - 10.5 K/uL Final  . RBC 12/03/2015 4.12  3.87 - 5.11 MIL/uL Final  . Hemoglobin 12/03/2015 11.4* 12.0 - 15.0 g/dL Final  . HCT 12/03/2015 34.2* 36.0 - 46.0 % Final  . MCV 12/03/2015 83.0  78.0 - 100.0 fL Final  . MCH 12/03/2015 27.7  26.0 - 34.0 pg Final  . MCHC 12/03/2015 33.3  30.0 - 36.0 g/dL Final  . RDW 12/03/2015 14.9  11.5 - 15.5 % Final  . Platelets 12/03/2015 219  150 - 400 K/uL Final  . Sodium 12/03/2015 139  135 - 145 mmol/L Final  . Potassium 12/03/2015 4.5  3.5 - 5.1 mmol/L Final  . Chloride 12/03/2015 105  101 - 111 mmol/L Final  . CO2 12/03/2015 27  22 - 32 mmol/L Final    . Glucose, Bld 12/03/2015 239* 65 - 99 mg/dL Final  . BUN 12/03/2015 14  6 - 20 mg/dL Final  . Creatinine, Ser 12/03/2015 0.57  0.44 - 1.00 mg/dL Final  . Calcium 12/03/2015 9.0  8.9 - 10.3 mg/dL Final  . GFR calc non Af Amer 12/03/2015 >60  >60 mL/min Final  . GFR calc Af Amer 12/03/2015 >60  >60 mL/min Final   Comment: (NOTE) The eGFR has been calculated using the CKD EPI equation. This calculation has not been validated in all clinical situations. eGFR's persistently <60 mL/min signify possible Chronic Kidney Disease.   . Anion gap 12/03/2015 7  5 - 15 Final  . Glucose-Capillary 12/02/2015 263* 65 - 99 mg/dL Final  . Glucose-Capillary 12/02/2015 198* 65 - 99 mg/dL Final  . Glucose-Capillary 12/03/2015 132* 65 - 99 mg/dL Final  Hospital Outpatient Visit on 11/26/2015  Component Date Value Ref Range Status  . aPTT 11/26/2015 31  24 - 37 seconds Final  . WBC 11/26/2015 6.3  4.0 - 10.5 K/uL Final  . RBC 11/26/2015 4.63  3.87 - 5.11 MIL/uL Final  . Hemoglobin 11/26/2015 12.6  12.0 - 15.0 g/dL Final  . HCT 11/26/2015 39.0  36.0 - 46.0 % Final  . MCV 11/26/2015 84.2  78.0 - 100.0 fL Final  .  MCH 11/26/2015 27.2  26.0 - 34.0 pg Final  . MCHC 11/26/2015 32.3  30.0 - 36.0 g/dL Final  . RDW 11/26/2015 15.2  11.5 - 15.5 % Final  . Platelets 11/26/2015 229  150 - 400 K/uL Final  . Sodium 11/26/2015 141  135 - 145 mmol/L Final  . Potassium 11/26/2015 4.2  3.5 - 5.1 mmol/L Final  . Chloride 11/26/2015 106  101 - 111 mmol/L Final  . CO2 11/26/2015 26  22 - 32 mmol/L Final  . Glucose, Bld 11/26/2015 153* 65 - 99 mg/dL Final  . BUN 11/26/2015 16  6 - 20 mg/dL Final  . Creatinine, Ser 11/26/2015 0.66  0.44 - 1.00 mg/dL Final  . Calcium 11/26/2015 10.1  8.9 - 10.3 mg/dL Final  . Total Protein 11/26/2015 7.1  6.5 - 8.1 g/dL Final  . Albumin 11/26/2015 3.5  3.5 - 5.0 g/dL Final  . AST 11/26/2015 15  15 - 41 U/L Final  . ALT 11/26/2015 17  14 - 54 U/L Final  . Alkaline Phosphatase 11/26/2015 73   38 - 126 U/L Final  . Total Bilirubin 11/26/2015 0.4  0.3 - 1.2 mg/dL Final  . GFR calc non Af Amer 11/26/2015 >60  >60 mL/min Final  . GFR calc Af Amer 11/26/2015 >60  >60 mL/min Final   Comment: (NOTE) The eGFR has been calculated using the CKD EPI equation. This calculation has not been validated in all clinical situations. eGFR's persistently <60 mL/min signify possible Chronic Kidney Disease.   . Anion gap 11/26/2015 9  5 - 15 Final  . Prothrombin Time 11/26/2015 17.7* 11.6 - 15.2 seconds Final  . INR 11/26/2015 1.44  0.00 - 1.49 Final  . ABO/RH(D) 11/26/2015 A POS   Final  . Antibody Screen 11/26/2015 NEG   Final  . Sample Expiration 11/26/2015 12/05/2015   Final  . Extend sample reason 11/26/2015 NO TRANSFUSIONS OR PREGNANCY IN THE PAST 3 MONTHS   Final  . Color, Urine 11/26/2015 YELLOW  YELLOW Final  . APPearance 11/26/2015 CLOUDY* CLEAR Final  . Specific Gravity, Urine 11/26/2015 1.029  1.005 - 1.030 Final  . pH 11/26/2015 6.0  5.0 - 8.0 Final  . Glucose, UA 11/26/2015 NEGATIVE  NEGATIVE mg/dL Final  . Hgb urine dipstick 11/26/2015 NEGATIVE  NEGATIVE Final  . Bilirubin Urine 11/26/2015 NEGATIVE  NEGATIVE Final  . Ketones, ur 11/26/2015 NEGATIVE  NEGATIVE mg/dL Final  . Protein, ur 11/26/2015 NEGATIVE  NEGATIVE mg/dL Final  . Nitrite 11/26/2015 NEGATIVE  NEGATIVE Final  . Leukocytes, UA 11/26/2015 TRACE* NEGATIVE Final  . MRSA, PCR 11/26/2015 NEGATIVE  NEGATIVE Final  . Staphylococcus aureus 11/26/2015 NEGATIVE  NEGATIVE Final   Comment:        The Xpert SA Assay (FDA approved for NASAL specimens in patients over 72 years of age), is one component of a comprehensive surveillance program.  Test performance has been validated by [facility] for patients greater than or equal to 35 year old. It is not intended to diagnose infection nor to guide or monitor treatment.   . Squamous Epithelial / LPF 11/26/2015 6-30* NONE SEEN Final  . WBC, UA 11/26/2015 6-30  0 - 5  WBC/hpf Final  . RBC / HPF 11/26/2015 0-5  0 - 5 RBC/hpf Final  . Bacteria, UA 11/26/2015 FEW* NONE SEEN Final  . Urine-Other 11/26/2015 MUCOUS PRESENT   Final     X-Rays:Dg Pelvis Portable  12/02/2015  CLINICAL DATA:  Postop left hip replacement EXAM: PORTABLE PELVIS  1-2 VIEWS COMPARISON:  04/30/2010 FINDINGS: Remote changes of right hip replacement. New left hip replacement changes. Soft tissue drain noted adjacent to the left hip. No hardware or bony complicating feature. Normal AP alignment. IMPRESSION: Interval left hip replacement without visible complicating feature. Electronically Signed   By: Rolm Baptise M.D.   On: 12/02/2015 10:24   Dg C-arm 1-60 Min-no Report  12/02/2015  CLINICAL DATA: hip C-ARM 1-60 MINUTES Fluoroscopy was utilized by the requesting physician.  No radiographic interpretation.    EKG:No orders found for this or any previous visit.   Hospital Course: Patient was admitted to [facility] and taken to the OR and underwent the above state procedure without complications.  Patient tolerated the procedure well and was later transferred to the recovery room and then to the orthopaedic floor for postoperative care.  They were given PO and IV analgesics for pain control following their surgery.  They were given 24 hours of postoperative antibiotics of  Anti-infectives    Start     Dose/Rate Route Frequency Ordered Stop   12/02/15 1400  ceFAZolin (ANCEF) IVPB 2g/100 mL premix     2 g 200 mL/hr over 30 Minutes Intravenous Every 6 hours 12/02/15 1141 12/02/15 2009   12/02/15 0745  ceFAZolin (ANCEF) IVPB 1 g/50 mL premix     1 g 100 mL/hr over 30 Minutes Intravenous  Once 12/02/15 0734 12/02/15 0748   12/02/15 0600  ceFAZolin (ANCEF) 3 g in dextrose 5 % 50 mL IVPB  Status:  Discontinued     3 g 130 mL/hr over 30 Minutes Intravenous On call to O.R. 12/01/15 1328 12/02/15 1141     and started on DVT prophylaxis in the form of Xarelto.   PT and OT were ordered for  total hip protocol.  The patient was allowed to be WBAT with therapy. Discharge planning was consulted to help with postop disposition and equipment needs.  Patient had a good night on the evening of surgery.  They started to get up OOB with therapy on day one.  Hemovac drain was pulled without difficulty. Dressing was checked on day one and was clean and dry.  Patient was seen in rounds on POD 1 and was ready to go home following therapy.  Discharge home with home health Diet - Cardiac diet and Diabetic diet Follow up - in 2 weeks on Tuesday 4/18 Activity - WBAT Disposition - Home Condition Upon Discharge - improving D/C Meds - See DC Summary DVT Prophylaxis - Xarelto  Discharge Instructions    Call MD / Call 911    Complete by:  As directed   If you experience chest pain or shortness of breath, CALL 911 and be transported to the hospital emergency room.  If you develope a fever above 101 F, pus (white drainage) or increased drainage or redness at the wound, or calf pain, call your surgeon's office.     Change dressing    Complete by:  As directed   You may change your dressing dressing daily with sterile 4 x 4 inch gauze dressing and paper tape.  Do not submerge the incision under water.     Constipation Prevention    Complete by:  As directed   Drink plenty of fluids.  Prune juice may be helpful.  You may use a stool softener, such as Colace (over the counter) 100 mg twice a day.  Use MiraLax (over the counter) for constipation as needed.     Diet -  low sodium heart healthy    Complete by:  As directed      Diet Carb Modified    Complete by:  As directed      Discharge instructions    Complete by:  As directed   Pick up stool softner and laxative for home use following surgery while on pain medications. Do not submerge incision under water. May remove the surgical dressing tomorrow, Wednesday 12/04/15, and then apply a dry gauze dressing daily. Please use good hand washing techniques  while changing dressing each day. May shower starting three days after surgery starting Thursday 12/05/15. Please use a clean towel to pat the incision dry following showers. Continue to use ice for pain and swelling after surgery. Do not use any lotions or creams on the incision until instructed by your surgeon.  Postoperative Constipation Protocol  Constipation - defined medically as fewer than three stools per week and severe constipation as less than one stool per week.  One of the most common issues patients have following surgery is constipation. Even if you have a regular bowel pattern at home, your normal regimen is likely to be disrupted due to multiple reasons following surgery. Combination of anesthesia, postoperative narcotics, change in appetite and fluid intake all can affect your bowels. In order to avoid complications following surgery, here are some recommendations in order to help you during your recovery period.  Colace (docusate) - Pick up an over-the-counter form of Colace or another stool softener and take twice a day as long as you are requiring postoperative pain medications. Take with a full glass of water daily. If you experience loose stools or diarrhea, hold the colace until you stool forms back up. If your symptoms do not get better within 1 week or if they get worse, check with your doctor.  Dulcolax (bisacodyl) - Pick up over-the-counter and take as directed by the product packaging as needed to assist with the movement of your bowels. Take with a full glass of water. Use this product as needed if not relieved by Colace only.   MiraLax (polyethylene glycol) - Pick up over-the-counter to have on hand. MiraLax is a solution that will increase the amount of water in your bowels to assist with bowel movements. Take as directed and can mix with a glass of water, juice, soda, coffee, or tea. Take if you go more than two days without a movement. Do not use MiraLax  more than once per day. Call your doctor if you are still constipated or irregular after using this medication for 7 days in a row.  If you continue to have problems with postoperative constipation, please contact the office for further assistance and recommendations. If you experience "the worst abdominal pain ever" or develop nausea or vomiting, please contact the office immediatly for further recommendations for treatment.  Take Xarelto for two and a half more weeks, then discontinue Xarelto. Once the patient has completed the blood thinner regimen, then take a Baby 81 mg Aspirin daily for three more weeks.     Do not sit on low chairs, stoools or toilet seats, as it may be difficult to get up from low surfaces    Complete by:  As directed      Driving restrictions    Complete by:  As directed   No driving until released by the physician.     Increase activity slowly as tolerated    Complete by:  As directed      Lifting  restrictions    Complete by:  As directed   No lifting until released by the physician.     Patient may shower    Complete by:  As directed   You may shower without a dressing once there is no drainage.  Do not wash over the wound.  If drainage remains, do not shower until drainage stops.     TED hose    Complete by:  As directed   Use stockings (TED hose) for 3 weeks on both leg(s).  You may remove them at night for sleeping.     Weight bearing as tolerated    Complete by:  As directed   Laterality:  left  Extremity:  Lower            Medication List    STOP taking these medications        amoxicillin-clavulanate 875-125 MG tablet  Commonly known as:  AUGMENTIN     HYDROcodone-acetaminophen 5-325 MG tablet  Commonly known as:  NORCO/VICODIN      TAKE these medications        amLODipine 5 MG tablet  Commonly known as:  NORVASC  Take 5 mg by mouth daily.     atorvastatin 20 MG tablet  Commonly known as:  LIPITOR  Take 20 mg by mouth daily.      furosemide 20 MG tablet  Commonly known as:  LASIX  Take 20 mg by mouth 2 (two) times daily.     glimepiride 4 MG tablet  Commonly known as:  AMARYL  Take 2 mg by mouth 2 (two) times daily.     losartan 100 MG tablet  Commonly known as:  COZAAR  Take 100 mg by mouth every morning.     metFORMIN 500 MG tablet  Commonly known as:  GLUCOPHAGE  Take 1,000 mg by mouth 2 (two) times daily with a meal.     methocarbamol 500 MG tablet  Commonly known as:  ROBAXIN  Take 1 tablet (500 mg total) by mouth every 6 (six) hours as needed for muscle spasms.     oxyCODONE 5 MG immediate release tablet  Commonly known as:  Oxy IR/ROXICODONE  Take 1-2 tablets (5-10 mg total) by mouth every 3 (three) hours as needed for moderate pain or severe pain.     rivaroxaban 10 MG Tabs tablet  Commonly known as:  XARELTO  Take 1 tablet (10 mg total) by mouth daily with breakfast. Take Xarelto for two and a half more weeks, then discontinue Xarelto. Once the patient has completed the blood thinner regimen, then take a Baby 81 mg Aspirin daily for three more weeks.     traMADol 50 MG tablet  Commonly known as:  ULTRAM  Take 1-2 tablets (50-100 mg total) by mouth every 6 (six) hours as needed (mild pain).           Follow-up Information    Follow up with Gearlean Alf, MD. Schedule an appointment as soon as possible for a visit on 12/17/2015.   Specialty:  Orthopedic Surgery   Why:  Call office at [phone] to setup appointment on Tuesday 4/18 with Dr. Wynelle Link.   Contact information:   [address] [place] [address] [phone]       Signed: Arlee Muslim, PA-C Orthopaedic Surgery 12/03/2015, 8:34 AM

## 2015-12-03 NOTE — Progress Notes (Signed)
Physical Therapy Treatment Patient Details Name: BELLAMIE CAPOTOSTO MRN: [id number] DOB: Jan 08, 1956 Today's Date: 12/03/2015    History of Present Illness LDATHA    PT Comments    Patient c/o dizziness. BOP 138/74, improved with mobility. Ready for DC  Follow Up Recommendations  Home health PT;Supervision/Assistance - 24 hour     Equipment Recommendations       Recommendations for Other Services       Precautions / Restrictions Precautions Precautions: Fall    Mobility  Bed Mobility                  Transfers Overall transfer level: Needs assistance Equipment used: Rolling walker (2 wheeled) Transfers: Sit to/from Stand Sit to Stand: Supervision         General transfer comment: cues for hand placement and L leg position  Ambulation/Gait Ambulation/Gait assistance: Supervision Ambulation Distance (Feet): 80 Feet Assistive device: Rolling walker (2 wheeled) Gait Pattern/deviations: Step-to pattern;Antalgic;Decreased stance time - left;Decreased step length - left     General Gait Details: cues for sequence   Stairs Stairs: Yes Stairs assistance: Mod assist Stair Management: One rail Left;Step to pattern;Forwards;With cane Number of Stairs: 3 General stair comments: cues for safety  Wheelchair Mobility    Modified Rankin (Stroke Patients Only)       Balance                                    Cognition                            Exercises Total Joint Exercises Heel Slides: AAROM;Left;10 reps Hip ABduction/ADduction: AAROM;Left;10 reps Long Arc Quad: AAROM;Left;10 reps Marching in Standing: AROM;Left;10 reps;Seated    General Comments        Pertinent Vitals/Pain Pain Score: 6  Pain Location: L groin with sit to stand Pain Descriptors / Indicators: Tender;Burning Pain Intervention(s): Limited activity within patient's tolerance;Monitored during session;Premedicated before session;Repositioned;Ice  applied    Home Living                      Prior Function            PT Goals (current goals can now be found in the care plan section) Progress towards PT goals: Progressing toward goals    Frequency  7X/week    PT Plan Current plan remains appropriate    Co-evaluation             End of Session Equipment Utilized During Treatment: Gait belt Activity Tolerance: Patient tolerated treatment well Patient left: in chair;with call bell/phone within reach     Time: 1510-1535 PT Time Calculation (min) (ACUTE ONLY): 25 min  Charges:  $Gait Training: 8-22 mins $Self Care/Home Management: 8-22                    G Codes:      Claretha Cooper 12/03/2015, 6:13 PM

## 2015-12-03 NOTE — Progress Notes (Signed)
Physical Therapy Treatment Patient Details Name: DESHA BOLDING MRN: [id number] DOB: March 30, 1956 Today's Date: 12/03/2015    History of Present Illness LDATHA    PT Comments    Patient is progressing well. Will ambulate then Dc this PM  Follow Up Recommendations  Home health PT;Supervision/Assistance - 24 hour     Equipment Recommendations       Recommendations for Other Services       Precautions / Restrictions Precautions Precautions: Fall    Mobility  Bed Mobility                  Transfers Overall transfer level: Needs assistance Equipment used: Rolling walker (2 wheeled) Transfers: Sit to/from Stand Sit to Stand: Min assist         General transfer comment: cues for hand placement and L leg position  Ambulation/Gait Ambulation/Gait assistance: Min guard Ambulation Distance (Feet): 50 Feet Assistive device: Rolling walker (2 wheeled) Gait Pattern/deviations: Step-to pattern     General Gait Details: cues for sequence   Stairs Stairs: Yes Stairs assistance: Mod assist Stair Management: One rail Left;Step to pattern;Forwards;With cane Number of Stairs: 3 General stair comments: cues for safety  Wheelchair Mobility    Modified Rankin (Stroke Patients Only)       Balance                                    Cognition                            Exercises Total Joint Exercises Heel Slides: AAROM;Left;10 reps Hip ABduction/ADduction: AAROM;Left;10 reps Long Arc Quad: AAROM;Left;10 reps    General Comments        Pertinent Vitals/Pain Pain Score: 6  Pain Location: l groin Pain Descriptors / Indicators: Burning;Cramping Pain Intervention(s): Limited activity within patient's tolerance;Monitored during session;Premedicated before session;Repositioned;Ice applied    Home Living                      Prior Function            PT Goals (current goals can now be found in the care plan  section) Progress towards PT goals: Progressing toward goals    Frequency  7X/week    PT Plan Current plan remains appropriate    Co-evaluation             End of Session Equipment Utilized During Treatment: Gait belt Activity Tolerance: Patient tolerated treatment well Patient left: in chair;with call bell/phone within reach     Time: 1221-1241 PT Time Calculation (min) (ACUTE ONLY): 20 min  Charges:  $Gait Training: 8-22 mins                    G Codes:      Claretha Cooper 12/03/2015, 6:10 PM

## 2015-12-03 NOTE — Discharge Instructions (Signed)
Information on my medicine - XARELTO (Rivaroxaban)  This medication education was reviewed with me or my healthcare representative as part of my discharge preparation.  The pharmacist that spoke with me during my hospital stay was:  Quinnlan Abruzzo, [facility]  Why was Xarelto prescribed for you? Xarelto was prescribed for you to reduce the risk of blood clots forming after orthopedic surgery. The medical term for these abnormal blood clots is venous thromboembolism (VTE).  What do you need to know about xarelto ? Take your Xarelto ONCE DAILY at the same time every day. You may take it either with or without food.  If you have difficulty swallowing the tablet whole, you may crush it and mix in applesauce just prior to taking your dose.  Take Xarelto exactly as prescribed by your doctor and DO NOT stop taking Xarelto without talking to the doctor who prescribed the medication.  Stopping without other VTE prevention medication to take the place of Xarelto may increase your risk of developing a clot.  After discharge, you should have regular check-up appointments with your healthcare provider that is prescribing your Xarelto.    What do you do if you miss a dose? If you miss a dose, take it as soon as you remember on the same day then continue your regularly scheduled once daily regimen the next day. Do not take two doses of Xarelto on the same day.   Important Safety Information A possible side effect of Xarelto is bleeding. You should call your healthcare provider right away if you experience any of the following: ? Bleeding from an injury or your nose that does not stop. ? Unusual colored urine (red or dark brown) or unusual colored stools (red or black). ? Unusual bruising for unknown reasons. ? A serious fall or if you hit your head (even if there is no bleeding).  Some medicines may interact with Xarelto and might increase your risk of bleeding while on Xarelto. To help avoid  this, consult your healthcare provider or pharmacist prior to using any new prescription or non-prescription medications, including herbals, vitamins, non-steroidal anti-inflammatory drugs (NSAIDs) and supplements.  This website has more information on Xarelto: [email or web address]

## 2015-12-03 NOTE — Progress Notes (Signed)
[facility] is providing the following services: Hospital bed and commode  If patient discharges after hours, please call [phone].   Linward Headland 12/03/2015, 10:56 AM

## 2016-06-19 ENCOUNTER — Other Ambulatory Visit: Payer: Self-pay | Admitting: Family Medicine

## 2016-06-19 DIAGNOSIS — Z1231 Encounter for screening mammogram for malignant neoplasm of breast: Secondary | ICD-10-CM

## 2016-07-16 ENCOUNTER — Ambulatory Visit
Admission: RE | Admit: 2016-07-16 | Discharge: 2016-07-16 | Disposition: A | Discharge status: Home / Self Care | Payer: 59 | Source: Ambulatory Visit | Attending: Family Medicine | Admitting: Family Medicine

## 2016-07-16 DIAGNOSIS — Z1231 Encounter for screening mammogram for malignant neoplasm of breast: Secondary | ICD-10-CM

## 2016-07-21 ENCOUNTER — Other Ambulatory Visit: Payer: Self-pay | Admitting: Family Medicine

## 2016-07-21 DIAGNOSIS — R928 Other abnormal and inconclusive findings on diagnostic imaging of breast: Secondary | ICD-10-CM

## 2016-07-28 ENCOUNTER — Other Ambulatory Visit: Payer: Self-pay | Admitting: Family Medicine

## 2016-07-28 ENCOUNTER — Ambulatory Visit
Admission: RE | Admit: 2016-07-28 | Discharge: 2016-07-28 | Disposition: A | Discharge status: Home / Self Care | Payer: 59 | Source: Ambulatory Visit | Attending: Family Medicine | Admitting: Family Medicine

## 2016-07-28 DIAGNOSIS — R921 Mammographic calcification found on diagnostic imaging of breast: Secondary | ICD-10-CM

## 2016-07-28 DIAGNOSIS — R928 Other abnormal and inconclusive findings on diagnostic imaging of breast: Secondary | ICD-10-CM

## 2016-07-29 ENCOUNTER — Ambulatory Visit
Admission: RE | Admit: 2016-07-29 | Discharge: 2016-07-29 | Disposition: A | Discharge status: Home / Self Care | Payer: 59 | Source: Ambulatory Visit | Attending: Family Medicine | Admitting: Family Medicine

## 2016-07-29 DIAGNOSIS — R921 Mammographic calcification found on diagnostic imaging of breast: Secondary | ICD-10-CM

## 2016-09-24 DIAGNOSIS — Z96642 Presence of left artificial hip joint: Secondary | ICD-10-CM | POA: Diagnosis not present

## 2016-09-24 DIAGNOSIS — Z471 Aftercare following joint replacement surgery: Secondary | ICD-10-CM | POA: Diagnosis not present

## 2016-10-05 DIAGNOSIS — E785 Hyperlipidemia, unspecified: Secondary | ICD-10-CM | POA: Diagnosis not present

## 2016-10-05 DIAGNOSIS — Z1159 Encounter for screening for other viral diseases: Secondary | ICD-10-CM | POA: Diagnosis not present

## 2016-10-05 DIAGNOSIS — I10 Essential (primary) hypertension: Secondary | ICD-10-CM | POA: Diagnosis not present

## 2016-10-05 DIAGNOSIS — E1165 Type 2 diabetes mellitus with hyperglycemia: Secondary | ICD-10-CM | POA: Diagnosis not present

## 2016-10-23 DIAGNOSIS — M2141 Flat foot [pes planus] (acquired), right foot: Secondary | ICD-10-CM | POA: Diagnosis not present

## 2016-10-23 DIAGNOSIS — I890 Lymphedema, not elsewhere classified: Secondary | ICD-10-CM | POA: Diagnosis not present

## 2016-11-03 ENCOUNTER — Other Ambulatory Visit: Payer: Self-pay | Admitting: Surgery

## 2016-11-03 DIAGNOSIS — I890 Lymphedema, not elsewhere classified: Secondary | ICD-10-CM

## 2016-12-10 DIAGNOSIS — E119 Type 2 diabetes mellitus without complications: Secondary | ICD-10-CM | POA: Diagnosis not present

## 2016-12-17 ENCOUNTER — Encounter (INDEPENDENT_AMBULATORY_CARE_PROVIDER_SITE_OTHER): Payer: 59 | Admitting: Family Medicine

## 2016-12-18 ENCOUNTER — Encounter: Payer: Self-pay | Admitting: Surgery

## 2016-12-26 ENCOUNTER — Emergency Department (HOSPITAL_COMMUNITY)
Admission: EM | Admit: 2016-12-26 | Discharge: 2016-12-26 | Disposition: A | Discharge status: Home / Self Care | Payer: PRIVATE HEALTH INSURANCE | Attending: Emergency Medicine | Admitting: Emergency Medicine

## 2016-12-26 ENCOUNTER — Encounter (HOSPITAL_COMMUNITY): Payer: Self-pay | Admitting: Emergency Medicine

## 2016-12-26 ENCOUNTER — Emergency Department (HOSPITAL_COMMUNITY): Payer: PRIVATE HEALTH INSURANCE

## 2016-12-26 DIAGNOSIS — M25552 Pain in left hip: Secondary | ICD-10-CM | POA: Insufficient documentation

## 2016-12-26 DIAGNOSIS — W19XXXA Unspecified fall, initial encounter: Secondary | ICD-10-CM

## 2016-12-26 DIAGNOSIS — S79911A Unspecified injury of right hip, initial encounter: Secondary | ICD-10-CM | POA: Diagnosis present

## 2016-12-26 DIAGNOSIS — W1839XA Other fall on same level, initial encounter: Secondary | ICD-10-CM | POA: Diagnosis not present

## 2016-12-26 DIAGNOSIS — M25551 Pain in right hip: Secondary | ICD-10-CM | POA: Diagnosis not present

## 2016-12-26 DIAGNOSIS — Z96643 Presence of artificial hip joint, bilateral: Secondary | ICD-10-CM | POA: Diagnosis not present

## 2016-12-26 DIAGNOSIS — E119 Type 2 diabetes mellitus without complications: Secondary | ICD-10-CM | POA: Diagnosis not present

## 2016-12-26 DIAGNOSIS — Z7984 Long term (current) use of oral hypoglycemic drugs: Secondary | ICD-10-CM | POA: Insufficient documentation

## 2016-12-26 DIAGNOSIS — I10 Essential (primary) hypertension: Secondary | ICD-10-CM | POA: Diagnosis not present

## 2016-12-26 DIAGNOSIS — Z87891 Personal history of nicotine dependence: Secondary | ICD-10-CM | POA: Insufficient documentation

## 2016-12-26 DIAGNOSIS — Y9389 Activity, other specified: Secondary | ICD-10-CM | POA: Insufficient documentation

## 2016-12-26 DIAGNOSIS — Z79899 Other long term (current) drug therapy: Secondary | ICD-10-CM | POA: Insufficient documentation

## 2016-12-26 DIAGNOSIS — Z7901 Long term (current) use of anticoagulants: Secondary | ICD-10-CM | POA: Diagnosis not present

## 2016-12-26 DIAGNOSIS — Y999 Unspecified external cause status: Secondary | ICD-10-CM | POA: Insufficient documentation

## 2016-12-26 DIAGNOSIS — Y9289 Other specified places as the place of occurrence of the external cause: Secondary | ICD-10-CM | POA: Insufficient documentation

## 2016-12-26 MED ORDER — IBUPROFEN 800 MG PO TABS: 800.0000 mg | ORAL_TABLET | Freq: Three times a day (TID) | ORAL | 0 refills | Status: DC

## 2016-12-26 NOTE — ED Provider Notes (Signed)
[facility] DEPT Provider Note   CSN: [id number] Arrival date & time: 12/26/16  1605 By signing my name below, I, [name], attest that this documentation has been prepared under the direction and in the presence of non physician provider, Harlene Ramus, PA-C. Electronically Signed: [name], Scribe. 12/26/2016. 5:22 PM  History   Chief Complaint Chief Complaint  Patient presents with  . Hip Pain   HPI  Laura Blackwell is a 61 y.o. female with history of osteoarthritis of the hip, s/p bilateral hip arthroplasty, presents to the Emergency Department complaining gradual onset, constant pain to bilateral hips that does not radiate s/p mechanical fall 1 hour PTA. Pt was sitting on an exercise ball chair which slipped out from under her, causing her to fall and land on her buttocks. Pt has ambulated since the fall, but notes increased pain with ambulation. No OTC treatments tried for these symptoms PTA.  No head injury or LOC. Pt denies any other injuries sustained during fall. She denies any neck pain, back pain, weakness, numbness in the legs, saddle anesthesia, urinary or bowel incontinence, headache. Pt reports she had her left hip replaced last year and right hip replaced in 2011. Denies use of anticoagulants.    The history is provided by the patient. No language interpreter was used.   Past Medical History:  Diagnosis Date  . Arthritis   . Diabetes mellitus without complication ([place])   . Edema    in left leg in 1980s on left ankle   . Hypertension    Patient Active Problem List   Diagnosis Date Noted  . OA (osteoarthritis) of hip 12/02/2015    Past Surgical History:  Procedure Laterality Date  . JOINT REPLACEMENT  2011    right hip   . left ankle surgery     . TOTAL HIP ARTHROPLASTY Left 12/02/2015   Procedure: LEFT TOTAL HIP ARTHROPLASTY ANTERIOR APPROACH;  Surgeon: [name], MD;  Location: [facility] ORS;  Service: Orthopedics;  Laterality: Left;    OB  History    No data available      Home Medications    Prior to Admission medications   Medication Sig Start Date End Date Taking? Authorizing Provider  amLODipine (NORVASC) 5 MG tablet Take 5 mg by mouth daily.  08/30/15   Historical Provider, MD  atorvastatin (LIPITOR) 20 MG tablet Take 20 mg by mouth daily.  06/21/15   Historical Provider, MD  furosemide (LASIX) 20 MG tablet Take 20 mg by mouth 2 (two) times daily.  06/21/15   Historical Provider, MD  glimepiride (AMARYL) 4 MG tablet Take 2 mg by mouth 2 (two) times daily.  08/30/15   Historical Provider, MD  ibuprofen (ADVIL,MOTRIN) 800 MG tablet Take 1 tablet (800 mg total) by mouth 3 (three) times daily. 12/26/16   Nona Dell, PA-C  losartan (COZAAR) 100 MG tablet Take 100 mg by mouth every morning.  08/30/15   Historical Provider, MD  metFORMIN (GLUCOPHAGE) 500 MG tablet Take 1,000 mg by mouth 2 (two) times daily with a meal.  08/30/15   Historical Provider, MD  methocarbamol (ROBAXIN) 500 MG tablet Take 1 tablet (500 mg total) by mouth every 6 (six) hours as needed for muscle spasms. 12/03/15   Alexzandrew L Perkins, PA-C  oxyCODONE (OXY IR/ROXICODONE) 5 MG immediate release tablet Take 1-2 tablets (5-10 mg total) by mouth every 3 (three) hours as needed for moderate pain or severe pain. 12/03/15   Alexzandrew L Perkins, PA-C  rivaroxaban (  XARELTO) 10 MG TABS tablet Take 1 tablet (10 mg total) by mouth daily with breakfast. Take Xarelto for two and a half more weeks, then discontinue Xarelto. Once the patient has completed the blood thinner regimen, then take a Baby 81 mg Aspirin daily for three more weeks. 12/03/15   Alexzandrew L Perkins, PA-C  traMADol (ULTRAM) 50 MG tablet Take 1-2 tablets (50-100 mg total) by mouth every 6 (six) hours as needed (mild pain). 12/03/15   Alexzandrew Monika Salk, PA-C    Family History No family history on file.  Social History Social History  Substance Use Topics  . Smoking status: Former  Smoker    Quit date: 10/01/2002  . Smokeless tobacco: Never Used  . Alcohol use No    Allergies   Patient has no known allergies.  Review of Systems Review of Systems  Musculoskeletal: Positive for arthralgias (hips). Negative for back pain and neck pain.  Skin: Negative for wound.  Neurological: Negative for syncope, weakness and numbness.   Physical Exam Updated Vital Signs BP 140/83 (BP Location: Right Arm)   Pulse 85   Temp 98.4 F (36.9 C) (Oral)   Resp 16   SpO2 95%   Physical Exam  Constitutional: She is oriented to person, place, and time. She appears well-developed and well-nourished. No distress.  HENT:  Head: Normocephalic and atraumatic.  Eyes: Conjunctivae and EOM are normal. Right eye exhibits no discharge. Left eye exhibits no discharge. No scleral icterus.  Neck: Normal range of motion. Neck supple.  Cardiovascular: Normal rate, regular rhythm, normal heart sounds and intact distal pulses.   Pulmonary/Chest: Effort normal and breath sounds normal. No respiratory distress. She has no wheezes. She has no rales. She exhibits no tenderness.  Abdominal: Soft. Bowel sounds are normal. She exhibits no distension and no mass. There is no tenderness. There is no rebound and no guarding.  Musculoskeletal: Normal range of motion. She exhibits tenderness. She exhibits no edema or deformity.  Mild tenderness to palpation over bilateral posterior bilateral hips without swelling or ecchymosis present. Full ROM of bilateral hips, knees, ankles and feet with 5/5 strength. Pt able to stand and ambulate without assistance. Sensation intact. 2+ PT pulses. No midline C,T, or L tenderness.  Neurological: She is alert and oriented to person, place, and time. She has normal strength. No sensory deficit. Gait normal.  Skin: Skin is warm and dry. Capillary refill takes less than 2 seconds. She is not diaphoretic.  Nursing note and vitals reviewed.  ED Treatments / Results  DIAGNOSTIC  STUDIES:  Oxygen Saturation is 100% on RA, normal by my interpretation.    COORDINATION OF CARE:  5:14 PM Ordered x-ray. Discussed treatment plan with pt at bedside and pt agreed to plan.  Labs (all labs ordered are listed, but only abnormal results are displayed) Labs Reviewed - No data to display  EKG  EKG Interpretation None       Radiology Dg Hips Bilat With Pelvis 3-4 Views  Result Date: 12/26/2016 CLINICAL DATA:  Fall backwards onto floor. Bilateral hip pain. Initial encounter. EXAM: DG HIP (WITH OR WITHOUT PELVIS) 3-4V BILAT COMPARISON:  None. FINDINGS: Bipolar hip prostheses are seen bilaterally. No evidence of fracture or dislocation. No pelvic fracture identified. IMPRESSION: No acute findings. Electronically Signed   By: Earle Gell M.D.   On: 12/26/2016 17:08    Procedures Procedures (including critical care time)  Medications Ordered in ED Medications - No data to display   Initial Impression /  Assessment and Plan / ED Course  I have reviewed the triage vital signs and the nursing notes.  Pertinent labs & imaging results that were available during my care of the patient were reviewed by me and considered in my medical decision making (see chart for details).     Pt presents with bilateral hip pain s/p mechanical fall. Denies head injury or LOC. Denies use of anticoagulants. Reports hx of bilateral hip replacement. VSS. Patient X-Ray negative for obvious fracture or dislocation. Pain managed in ED. Pt advised to follow up with orthopedics if symptoms persist for possibility of missed fracture diagnosis. Conservative therapy recommended and discussed. Patient will be dc home & is agreeable with above plan. Discussed return precautions.    Final Clinical Impressions(s) / ED Diagnoses   Final diagnoses:  Fall  Fall, initial encounter  Pain of both hip joints    New Prescriptions Discharge Medication List as of 12/26/2016  5:18 PM    START taking these  medications   Details  ibuprofen (ADVIL,MOTRIN) 800 MG tablet Take 1 tablet (800 mg total) by mouth 3 (three) times daily., Starting Sat 12/26/2016, Print       I personally performed the services described in this documentation, which was scribed in my presence. The recorded information has been reviewed and is accurate.     Chesley Noon [place], [place] 12/26/16 1846    Daleen Bo, MD 12/27/16 1233

## 2016-12-26 NOTE — Discharge Instructions (Signed)
Take your medication as prescribed as needed for pain relief.I also recommend a plan ice to affected area for 15-20 minutes 3-4 times daily for additional pain relief. Follow-up with your primary care provider in the next 4-5 days after symptoms have not improved or worsened. Please return to the Emergency Department if symptoms worsen or new onset of headache, neck pain, back pain, groin numbness, loss of control of your urine or bowels, groin numbness, numbness, weakness.

## 2016-12-26 NOTE — ED Notes (Signed)
Patient transported to X-ray from triage.

## 2016-12-26 NOTE — ED Triage Notes (Signed)
Pt states shew as sitting on a chair and the chair came out and she fell on her back. Happened about 1 hour prior to checking in. Pt states she has bilateral hip replacements and wants to make sure her hips are okay. Pt states she wasn't going to check in but then her hips became sore. Pt ambulatory in triage with steady gait. VSS.

## 2016-12-26 NOTE — ED Notes (Signed)
Declined W/C at D/C and was escorted to lobby by RN. 

## 2016-12-26 NOTE — ED Notes (Signed)
Pt returns from radiology. 

## 2016-12-28 ENCOUNTER — Encounter: Payer: Self-pay | Admitting: Surgery

## 2016-12-28 ENCOUNTER — Ambulatory Visit (INDEPENDENT_AMBULATORY_CARE_PROVIDER_SITE_OTHER): Payer: 59 | Admitting: Surgery

## 2016-12-28 ENCOUNTER — Ambulatory Visit (HOSPITAL_COMMUNITY)
Admission: RE | Admit: 2016-12-28 | Discharge: 2016-12-28 | Disposition: A | Discharge status: Home / Self Care | Payer: 59 | Source: Ambulatory Visit | Attending: Surgery | Admitting: Surgery

## 2016-12-28 VITALS — BP 140/94 | HR 89 | Temp 97.2°F | Resp 16 | Ht 67.0 in | Wt 307.0 lb

## 2016-12-28 DIAGNOSIS — I890 Lymphedema, not elsewhere classified: Secondary | ICD-10-CM | POA: Diagnosis not present

## 2016-12-28 NOTE — Progress Notes (Signed)
[facility]  Patient name: Laura Blackwell MRN: [id number] DOB: 28-Dec-1955 Sex: female   REFERRING PROVIDER:    Dr. Doran Durand   REASON FOR CONSULT:    Leg swelling  HISTORY OF PRESENT ILLNESS:   Laura Blackwell is a 61 y.o. female, who is Referred today for evaluation of leg swelling.  She states that this is been present for many years.  The left leg bothers her more than the right.  She denies any history of trauma, however she has had surgery on her left ankle.  She has recently started wearing compression stockings and has noted a significant improvement in her symptoms.  The patient's medically managed for diabetes.  She is also on an ARB for hypertension.  PAST MEDICAL HISTORY    Past Medical History:  Diagnosis Date  . Arthritis   . Diabetes mellitus without complication ([place])   . Edema    in left leg in 1980s on left ankle   . Hypertension      FAMILY HISTORY   No family history on file.  SOCIAL HISTORY:   Social History   Social History  . Marital status: Single    Spouse name: N/A  . Number of children: N/A  . Years of education: N/A   Occupational History  . Not on file.   Social History Main Topics  . Smoking status: Former Smoker    Quit date: 10/01/2002  . Smokeless tobacco: Never Used  . Alcohol use No  . Drug use: No  . Sexual activity: Not on file   Other Topics Concern  . Not on file   Social History Narrative  . No narrative on file    ALLERGIES:    No Known Allergies  CURRENT MEDICATIONS:    Current Outpatient Prescriptions  Medication Sig Dispense Refill  . amLODipine (NORVASC) 5 MG tablet Take 5 mg by mouth daily.     Marland Kitchen atorvastatin (LIPITOR) 20 MG tablet Take 20 mg by mouth daily.     . furosemide (LASIX) 20 MG tablet Take 20 mg by mouth 2 (two) times daily.     Marland Kitchen glimepiride (AMARYL) 4 MG tablet Take 2 mg by mouth 2 (two) times daily.     Marland Kitchen  ibuprofen (ADVIL,MOTRIN) 800 MG tablet Take 1 tablet (800 mg total) by mouth 3 (three) times daily. 21 tablet 0  . losartan (COZAAR) 100 MG tablet Take 100 mg by mouth every morning.     . metFORMIN (GLUCOPHAGE) 500 MG tablet Take 1,000 mg by mouth 2 (two) times daily with a meal.     . sitaGLIPtin (JANUVIA) 100 MG tablet Take 100 mg by mouth daily.     No current facility-administered medications for this visit.     REVIEW OF SYSTEMS:   [X]  denotes positive finding, [ ]  denotes negative finding Cardiac  Comments:  Chest pain or chest pressure:    Shortness of breath upon exertion:    Short of breath when lying flat:    Irregular heart rhythm:        Vascular    Pain in calf, thigh, or hip brought on by ambulation:    Pain in feet at night that wakes you up from your sleep:     Blood clot in your veins:    Leg swelling:  x       Pulmonary    Oxygen at home:    Productive cough:     Wheezing:  Neurologic    Sudden weakness in arms or legs:     Sudden numbness in arms or legs:     Sudden onset of difficulty speaking or slurred speech:    Temporary loss of vision in one eye:     Problems with dizziness:         Gastrointestinal    Blood in stool:      Vomited blood:         Genitourinary    Burning when urinating:     Blood in urine:        Psychiatric    Major depression:         Hematologic    Bleeding problems:    Problems with blood clotting too easily:        Skin    Rashes or ulcers:        Constitutional    Fever or chills:     PHYSICAL EXAM:   Vitals:   12/28/16 1309  BP: (!) 140/94  Pulse: 89  Resp: 16  Temp: 97.2 F (36.2 C)  TempSrc: Oral  SpO2: 95%  Weight: (!) 307 lb (139.3 kg)  Height: 5\' 7"  (1.702 m)    GENERAL: The patient is a well-nourished female, in no acute distress. The vital signs are documented above. CARDIAC: There is a regular rate and rhythm.  VASCULAR: Palpable pedal pulses.  Significant bilateral lower  extremity edema, left greater than right with hyperpigmentation most prominent on the left PULMONARY: Nonlabored respirations MUSCULOSKELETAL: There are no major deformities or cyanosis. NEUROLOGIC: No focal weakness or paresthesias are detected. SKIN: There are no ulcers or rashes noted. PSYCHIATRIC: The patient has a normal affect.  STUDIES:   I have ordered and reviewed her venous reflux studies.  No DVT is identified.  No significant venous reflux was identified  ASSESSMENT and PLAN   Leg swelling: The patient does not have evidence of venous insufficiency, therefore I feel her symptoms are secondary to lymphedema.  I have discussed that the patient will need to her lifelong compression stockings.  She is to keep her legs elevated when possible.  I'm also making a referral to the lymphedema clinic.  She will call me if she has any additional questions.   Annamarie Major, MD [facility] [phone] Pager [phone]

## 2016-12-31 ENCOUNTER — Encounter (INDEPENDENT_AMBULATORY_CARE_PROVIDER_SITE_OTHER): Payer: Self-pay | Admitting: Family Medicine

## 2016-12-31 ENCOUNTER — Ambulatory Visit (INDEPENDENT_AMBULATORY_CARE_PROVIDER_SITE_OTHER): Payer: 59 | Admitting: Family Medicine

## 2016-12-31 VITALS — BP 123/83 | HR 97 | Temp 98.0°F | Resp 16 | Ht 67.0 in | Wt 303.0 lb

## 2016-12-31 DIAGNOSIS — E119 Type 2 diabetes mellitus without complications: Secondary | ICD-10-CM | POA: Diagnosis not present

## 2016-12-31 DIAGNOSIS — R5383 Other fatigue: Secondary | ICD-10-CM | POA: Insufficient documentation

## 2016-12-31 DIAGNOSIS — Z1331 Encounter for screening for depression: Secondary | ICD-10-CM

## 2016-12-31 DIAGNOSIS — R0602 Shortness of breath: Secondary | ICD-10-CM | POA: Insufficient documentation

## 2016-12-31 DIAGNOSIS — IMO0001 Reserved for inherently not codable concepts without codable children: Secondary | ICD-10-CM

## 2016-12-31 DIAGNOSIS — E785 Hyperlipidemia, unspecified: Secondary | ICD-10-CM

## 2016-12-31 DIAGNOSIS — Z1389 Encounter for screening for other disorder: Secondary | ICD-10-CM

## 2016-12-31 DIAGNOSIS — Z0289 Encounter for other administrative examinations: Secondary | ICD-10-CM

## 2016-12-31 NOTE — Progress Notes (Signed)
Office: [phone]  /  Fax: [phone]   HPI:   Chief Complaint: OBESITY  Laura Blackwell (MR# [id number]) is a 61 y.o. female who presents on 12/31/2016 for obesity evaluation and treatment. Current BMI is Body mass index is 47.46 kg/m.Marland Kitchen Laura Blackwell has struggled with obesity for years and has been unsuccessful in either losing weight or maintaining long term weight loss. Laura Blackwell attended our information session and states she is currently in the action stage of change and ready to dedicate time achieving and maintaining a healthier weight.  Laura Blackwell states her family eats meals together she thinks her family will eat healthier with  her she has been heavy most of  her life she started gaining weight late 65's  her heaviest weight ever was 303 lbs. she has significant food cravings issues  she snacks frequently in the evenings she is frequently drinking liquids with calories she frequently makes poor food choices she frequently eats larger portions than normal  she has binge eating behaviors she struggles with emotional eating    Fatigue Laura Blackwell feels her energy is lower than it should be. This has worsened with weight gain and has not worsened recently. Laura Blackwell admits to daytime somnolence and  denies waking up still tired. Patient is at risk for obstructive sleep apnea. Patent has a history of symptoms of daytime fatigue. Patient generally gets 7 hours of sleep per night, and states they generally have restful sleep. Snoring is present. Apneic episodes are present. Epworth Sleepiness Score is 10  Dyspnea on exertion Laura Blackwell notes increasing shortness of breath with exercising and seems to be worsening over time with weight gain. She notes getting out of breath sooner with activity than she used to. This has not gotten worse recently. Laura Blackwell denies orthopnea.  Diabetes II Laura Blackwell has a diagnosis of diabetes type II. Laura Blackwell states BGs range between 110 and 159 and denies any hypoglycemic  episodes. Last A1c was at 7.1 She has been working on intensive lifestyle modifications including diet, exercise, and weight loss to help control her blood glucose levels.  Hyperlipidemia Laura Blackwell has hyperlipidemia and is on Lipitor. She has been trying to improve her cholesterol levels with intensive lifestyle modification including a low saturated fat diet, exercise and weight loss. She denies any chest pain, claudication or myalgias.  Depression Screen Laura Blackwell's Food and Mood (modified PHQ-9) score was  Depression screen PHQ 2/9 12/31/2016  Decreased Interest 1  Down, Depressed, Hopeless 0  PHQ - 2 Score 1  Altered sleeping 1  Tired, decreased energy 2  Change in appetite 1  Feeling bad or failure about yourself  0  Trouble concentrating 1  Moving slowly or fidgety/restless 1  Suicidal thoughts 0  PHQ-9 Score 7    ALLERGIES: No Known Allergies  MEDICATIONS: Current Outpatient Prescriptions on File Prior to Visit  Medication Sig Dispense Refill  . amLODipine (NORVASC) 5 MG tablet Take 5 mg by mouth daily.     Marland Kitchen atorvastatin (LIPITOR) 20 MG tablet Take 20 mg by mouth daily.     . furosemide (LASIX) 20 MG tablet Take 20 mg by mouth 2 (two) times daily.     Marland Kitchen glimepiride (AMARYL) 4 MG tablet Take 2 mg by mouth 2 (two) times daily.     Marland Kitchen ibuprofen (ADVIL,MOTRIN) 800 MG tablet Take 1 tablet (800 mg total) by mouth 3 (three) times daily. 21 tablet 0  . losartan (COZAAR) 100 MG tablet Take 100 mg by mouth every morning.     Marland Kitchen  metFORMIN (GLUCOPHAGE) 500 MG tablet Take 1,000 mg by mouth 2 (two) times daily with a meal.     . sitaGLIPtin (JANUVIA) 100 MG tablet Take 100 mg by mouth daily.     No current facility-administered medications on file prior to visit.     PAST MEDICAL HISTORY: Past Medical History:  Diagnosis Date  . Arthritis   . Diabetes mellitus without complication ([place])   . Edema    in left leg in 1980s on left ankle   . Hypertension   . PCOS (polycystic ovarian  syndrome)   . Swelling   . Vitamin D deficiency     PAST SURGICAL HISTORY: Past Surgical History:  Procedure Laterality Date  . JOINT REPLACEMENT  2011    right hip   . left ankle surgery     . TOTAL HIP ARTHROPLASTY Left 12/02/2015   Procedure: LEFT TOTAL HIP ARTHROPLASTY ANTERIOR APPROACH;  Surgeon: [name], MD;  Location: [facility];  Service: Orthopedics;  Laterality: Left;    SOCIAL HISTORY: Social History  Substance Use Topics  . Smoking status: Former Smoker    Quit date: 10/01/2002  . Smokeless tobacco: Never Used  . Alcohol use No    FAMILY HISTORY: Family History  Problem Relation Age of Onset  . Diabetes Mother   . Hypertension Mother     ROS: Review of Systems  Constitutional: Positive for malaise/fatigue.  Eyes:       Wear Glasses or Contacts  Respiratory: Positive for shortness of breath (with exertion).   Cardiovascular: Negative for chest pain, orthopnea and claudication.  Musculoskeletal: Negative for myalgias.  Skin:       Dryness Hair or Nail Changes  Endo/Heme/Allergies:       Negative Hypoglycemia    PHYSICAL EXAM: Blood pressure 123/83, pulse 97, temperature 98 F (36.7 C), temperature source Oral, resp. rate 16, height 5\' 7"  (1.702 m), weight (!) 303 lb (137.4 kg), SpO2 99 %. Body mass index is 47.46 kg/m. Physical Exam  Constitutional: She appears well-developed and well-nourished.  Cardiovascular: Normal rate.   Pulmonary/Chest: Effort normal.  Musculoskeletal: Normal range of motion.  Skin: Skin is warm and dry.  Psychiatric: She has a normal mood and affect. Her behavior is normal.  Vitals reviewed.   RECENT LABS AND TESTS: BMET    Component Value Date/Time   NA 139 12/03/2015 0354   K 4.5 12/03/2015 0354   CL 105 12/03/2015 0354   CO2 27 12/03/2015 0354   GLUCOSE 239 (H) 12/03/2015 0354   BUN 14 12/03/2015 0354   CREATININE 0.57 12/03/2015 0354   CALCIUM 9.0 12/03/2015 0354   GFRNONAA >60 12/03/2015 0354   GFRAA >60  12/03/2015 0354   No results found for: HGBA1C No results found for: INSULIN CBC    Component Value Date/Time   WBC 12.8 (H) 12/03/2015 0354   RBC 4.12 12/03/2015 0354   HGB 11.4 (L) 12/03/2015 0354   HCT 34.2 (L) 12/03/2015 0354   PLT 219 12/03/2015 0354   MCV 83.0 12/03/2015 0354   MCH 27.7 12/03/2015 0354   MCHC 33.3 12/03/2015 0354   RDW 14.9 12/03/2015 0354   Iron/TIBC/Ferritin/ %Sat No results found for: IRON, TIBC, FERRITIN, IRONPCTSAT Lipid Panel  No results found for: CHOL, TRIG, HDL, CHOLHDL, VLDL, LDLCALC, LDLDIRECT Hepatic Function Panel     Component Value Date/Time   PROT 7.1 11/26/2015 0840   ALBUMIN 3.5 11/26/2015 0840   AST 15 11/26/2015 0840   ALT 17 11/26/2015 0840  ALKPHOS 73 11/26/2015 0840   BILITOT 0.4 11/26/2015 0840   No results found for: TSH  ECG  shows NSR with a rate of 97 BPM INDIRECT CALORIMETER done today shows a VO2 of 205 and a REE of 1429.    ASSESSMENT AND PLAN: Other fatigue - Plan: EKG 12-Lead, Comprehensive metabolic panel, CBC with Differential/Platelet, VITAMIN D 25 Hydroxy (Vit-D Deficiency, Fractures), Vitamin B12, Folate, TSH, T4, free, T3, Microalbumin / creatinine urine ratio  SOB (shortness of breath) on exertion  Type 2 diabetes mellitus without complication, without long-term current use of insulin (HCC) - Plan: Hemoglobin A1c, Insulin, random  Hyperlipidemia, unspecified hyperlipidemia type - Plan: Lipid Panel With LDL/HDL Ratio  Depression screening  Morbid obesity ([place])  PLAN:  Fatigue Laura Blackwell was informed that her fatigue may be related to obesity, depression or many other causes. Labs will be ordered, and in the meanwhile Geselle has agreed to work on diet, exercise and weight loss to help with fatigue. Proper sleep hygiene was discussed including the need for 7-8 hours of quality sleep each night. A sleep study was not ordered based on symptoms and Epworth score.  Dyspnea on exertion Laura Blackwell's shortness of  breath appears to be obesity related and exercise induced. She has agreed to work on weight loss and gradually increase exercise to treat her exercise induced shortness of breath. If Laura Blackwell follows our instructions and loses weight without improvement of her shortness of breath, we will plan to refer to pulmonology. We will monitor this condition regularly. Valene agrees to this plan.  Diabetes II Laura Blackwell has been given extensive diabetes education by myself today including ideal fasting and post-prandial blood glucose readings, individual ideal HgA1c goals  and hypoglycemia prevention. We discussed the importance of good blood sugar control to decrease the likelihood of diabetic complications such as nephropathy, neuropathy, limb loss, blindness, coronary artery disease, and death. We discussed the importance of intensive lifestyle modification including diet, exercise and weight loss as the first line treatment for diabetes. Laura Blackwell wants to improve with diet and is worried she will have to start insulin. We will check labs and Laura Blackwell agrees to continue her diabetes medications and will follow up at the agreed upon time.  Hyperlipidemia Laura Blackwell was informed of the American Heart Association Guidelines emphasizing intensive lifestyle modifications as the first line treatment for hyperlipidemia. We discussed many lifestyle modifications today in depth, and Laura Blackwell will continue to work on decreasing saturated fats such as fatty red meat, butter and many fried foods. She will also increase vegetables and lean protein in her diet and continue to work on exercise and weight loss efforts. We will check labs and Laura Blackwell agrees to follow up with our clinic in 2 weeks.  Depression Screen Laura Blackwell had a mildly positive depression screening. Depression is commonly associated with obesity and often results in emotional eating behaviors. We will monitor this closely and work on CBT to help improve the non-hunger eating  patterns. Referral to Psychology may be required if no improvement is seen as she continues in our clinic.  Obesity Taralynn is currently in the action stage of change and her goal is to continue with weight loss efforts She has agreed to follow the Category 2 plan +200 calories Alexsia has been instructed to work up to a goal of 150 minutes of combined cardio and strengthening exercise per week for weight loss and overall health benefits. We discussed the following Behavioral Modification Stratagies today: increasing lean protein intake, decrease eating out and  work on meal planning and easy [place] has agreed to follow up with our clinic in 2 weeks. She was informed of the importance of frequent follow up visits to maximize her success with intensive lifestyle modifications for her multiple health conditions. She was informed we would discuss her lab results at her next visit unless there is a critical issue that needs to be addressed sooner. Silver agreed to keep her next visit at the agreed upon time to discuss these results.  I, Doreene Nest, am acting as scribe for Dennard Nip, MD  I have reviewed the above documentation for accuracy and completeness, and I agree with the above. -Dennard Nip, MD

## 2017-01-01 LAB — COMPREHENSIVE METABOLIC PANEL
A/G RATIO: 1.3 (ref 1.2–2.2)
ALBUMIN: 3.8 g/dL (ref 3.5–5.5)
ALK PHOS: 76 IU/L (ref 39–117)
ALT: 14 IU/L (ref 0–32)
AST: 10 IU/L (ref 0–40)
BILIRUBIN TOTAL: 0.2 mg/dL (ref 0.0–1.2)
BUN / CREAT RATIO: 16 (ref 9–23)
BUN: 10 mg/dL (ref 6–24)
CHLORIDE: 101 mmol/L (ref 96–106)
CO2: 28 mmol/L (ref 18–29)
Calcium: 9.5 mg/dL (ref 8.7–10.2)
Creatinine, Ser: 0.62 mg/dL (ref 0.57–1.00)
GFR calc Af Amer: 115 mL/min/{1.73_m2} (ref >59)
GFR calc non Af Amer: 100 mL/min/{1.73_m2} (ref >59)
GLOBULIN, TOTAL: 3 g/dL (ref 1.5–4.5)
Glucose: 110 mg/dL — ABNORMAL HIGH (ref 65–99)
Potassium: 4.1 mmol/L (ref 3.5–5.2)
SODIUM: 142 mmol/L (ref 134–144)
Total Protein: 6.8 g/dL (ref 6.0–8.5)

## 2017-01-01 LAB — MICROALBUMIN / CREATININE URINE RATIO
CREATININE, UR: 73 mg/dL
Microalb/Creat Ratio: 6 mg/g creat (ref 0.0–30.0)
Microalbumin, Urine: 4.4 ug/mL

## 2017-01-01 LAB — LIPID PANEL WITH LDL/HDL RATIO
Cholesterol, Total: 148 mg/dL (ref 100–199)
HDL: 58 mg/dL (ref >39)
LDL CALC: 78 mg/dL (ref 0–99)
LDL/HDL RATIO: 1.3 ratio (ref 0.0–3.2)
Triglycerides: 61 mg/dL (ref 0–149)
VLDL Cholesterol Cal: 12 mg/dL (ref 5–40)

## 2017-01-01 LAB — CBC WITH DIFFERENTIAL/PLATELET
BASOS ABS: 0 10*3/uL (ref 0.0–0.2)
Basos: 0 %
EOS (ABSOLUTE): 0.1 10*3/uL (ref 0.0–0.4)
Eos: 1 %
HEMATOCRIT: 40.2 % (ref 34.0–46.6)
Hemoglobin: 13.3 g/dL (ref 11.1–15.9)
Immature Grans (Abs): 0 10*3/uL (ref 0.0–0.1)
Immature Granulocytes: 0 %
LYMPHS ABS: 1.2 10*3/uL (ref 0.7–3.1)
Lymphs: 19 %
MCH: 26.5 pg — ABNORMAL LOW (ref 26.6–33.0)
MCHC: 33.1 g/dL (ref 31.5–35.7)
MCV: 80 fL (ref 79–97)
MONOS ABS: 0.4 10*3/uL (ref 0.1–0.9)
Monocytes: 7 %
Neutrophils Absolute: 4.4 10*3/uL (ref 1.4–7.0)
Neutrophils: 73 %
Platelets: 223 10*3/uL (ref 150–379)
RBC: 5.01 x10E6/uL (ref 3.77–5.28)
RDW: 16.3 % — AB (ref 12.3–15.4)
WBC: 6.1 10*3/uL (ref 3.4–10.8)

## 2017-01-01 LAB — T4, FREE: Free T4: 1.49 ng/dL (ref 0.82–1.77)

## 2017-01-01 LAB — HEMOGLOBIN A1C
ESTIMATED AVERAGE GLUCOSE: 183 mg/dL
HEMOGLOBIN A1C: 8 % — AB (ref 4.8–5.6)

## 2017-01-01 LAB — VITAMIN B12: Vitamin B-12: 513 pg/mL (ref 232–1245)

## 2017-01-01 LAB — VITAMIN D 25 HYDROXY (VIT D DEFICIENCY, FRACTURES): Vit D, 25-Hydroxy: 20.4 ng/mL — ABNORMAL LOW (ref 30.0–100.0)

## 2017-01-01 LAB — FOLATE: Folate: 9.9 ng/mL (ref >3.0)

## 2017-01-01 LAB — INSULIN, RANDOM: INSULIN: 21.3 u[IU]/mL (ref 2.6–24.9)

## 2017-01-01 LAB — T3: T3 TOTAL: 122 ng/dL (ref 71–180)

## 2017-01-01 LAB — TSH: TSH: 0.508 u[IU]/mL (ref 0.450–4.500)

## 2017-01-14 ENCOUNTER — Ambulatory Visit (INDEPENDENT_AMBULATORY_CARE_PROVIDER_SITE_OTHER): Payer: 59 | Admitting: Family Medicine

## 2017-01-14 VITALS — BP 120/82 | HR 88 | Temp 98.0°F | Ht 67.0 in | Wt 298.0 lb

## 2017-01-14 DIAGNOSIS — Z9189 Other specified personal risk factors, not elsewhere classified: Secondary | ICD-10-CM | POA: Diagnosis not present

## 2017-01-14 DIAGNOSIS — E559 Vitamin D deficiency, unspecified: Secondary | ICD-10-CM | POA: Diagnosis not present

## 2017-01-14 DIAGNOSIS — E119 Type 2 diabetes mellitus without complications: Secondary | ICD-10-CM | POA: Diagnosis not present

## 2017-01-14 MED ORDER — LIRAGLUTIDE 18 MG/3ML ~~LOC~~ SOPN: 0.6000 mg | PEN_INJECTOR | Freq: Every morning | SUBCUTANEOUS | 0 refills | Status: DC

## 2017-01-14 MED ORDER — VITAMIN D (ERGOCALCIFEROL) 1.25 MG (50000 UNIT) PO CAPS: 50000.0000 [IU] | ORAL_CAPSULE | ORAL | 0 refills | Status: DC

## 2017-01-14 NOTE — Progress Notes (Signed)
Office: [phone]  /  Fax: [phone]   HPI:   Chief Complaint: OBESITY Laura Blackwell is here to discuss her progress with her obesity treatment plan. She is on the  follow the Category 2 plan +200 calories and is following her eating plan approximately 65 % of the time.  Laura Blackwell has done well with weight loss but felt it wasn't enough food and missed simple carbohydrates. She often skipped snacks and ate less than what she should have. Her weight is 298 lb (135.2 kg) today and has had a weight loss of 5 pounds over a period of 2 weeks since her last visit. She has lost 5 lbs since starting treatment with [place].  Vitamin D deficiency Laura Blackwell has a new diagnosis of vitamin D deficiency. She is not currently taking vit D and admits fatigue. She denies nausea, vomiting or muscle weakness.  Diabetes II Laura Blackwell has a diagnosis of diabetes type II. Laura Blackwell states fasting blood sugar dropped from 180 to between 96 and 142 with diet alone and she denies any hypoglycemic episodes. Last A1c was elevated at 8.0 and she admits polyphagia. She has been working on intensive lifestyle modifications including diet, exercise, and weight loss to help control her blood glucose levels.  At risk for cardiovascular disease Laura Blackwell is at a higher than average risk for cardiovascular disease due to obesity. She currently denies any chest pain.  ALLERGIES: No Known Allergies  MEDICATIONS: Current Outpatient Prescriptions on File Prior to Visit  Medication Sig Dispense Refill  . amLODipine (NORVASC) 5 MG tablet Take 5 mg by mouth daily.     Marland Kitchen atorvastatin (LIPITOR) 20 MG tablet Take 20 mg by mouth daily.     . furosemide (LASIX) 20 MG tablet Take 20 mg by mouth 2 (two) times daily.     Marland Kitchen glimepiride (AMARYL) 4 MG tablet Take 4 mg by mouth every morning.     Marland Kitchen ibuprofen (ADVIL,MOTRIN) 800 MG tablet Take 1 tablet (800 mg total) by mouth 3 (three) times daily. 21 tablet 0  . losartan (COZAAR) 100 MG tablet Take 100 mg by  mouth every morning.     . metFORMIN (GLUCOPHAGE) 500 MG tablet Take 1,000 mg by mouth 2 (two) times daily with a meal.     . sitaGLIPtin (JANUVIA) 100 MG tablet Take 100 mg by mouth daily.     No current facility-administered medications on file prior to visit.     PAST MEDICAL HISTORY: Past Medical History:  Diagnosis Date  . Arthritis   . Diabetes mellitus without complication ([place])   . Edema    in left leg in 1980s on left ankle   . Hypertension   . PCOS (polycystic ovarian syndrome)   . Swelling   . Vitamin D deficiency     PAST SURGICAL HISTORY: Past Surgical History:  Procedure Laterality Date  . JOINT REPLACEMENT  2011    right hip   . left ankle surgery     . TOTAL HIP ARTHROPLASTY Left 12/02/2015   Procedure: LEFT TOTAL HIP ARTHROPLASTY ANTERIOR APPROACH;  Surgeon: [name], MD;  Location: [facility];  Service: Orthopedics;  Laterality: Left;    SOCIAL HISTORY: Social History  Substance Use Topics  . Smoking status: Former Smoker    Quit date: 10/01/2002  . Smokeless tobacco: Never Used  . Alcohol use No    FAMILY HISTORY: Family History  Problem Relation Age of Onset  . Diabetes Mother   . Hypertension Mother  ROS: Review of Systems  Constitutional: Positive for malaise/fatigue and weight loss.  Cardiovascular: Negative for chest pain.  Gastrointestinal: Negative for nausea and vomiting.  Musculoskeletal:       Negative muscle weakness  Endo/Heme/Allergies:       Polyphagia Negative hypoglycemia    PHYSICAL EXAM: Blood pressure 120/82, pulse 88, temperature 98 F (36.7 C), temperature source Oral, height 5\' 7"  (1.702 m), weight 298 lb (135.2 kg), SpO2 99 %. Body mass index is 46.67 kg/m. Physical Exam  Constitutional: She is oriented to person, place, and time. She appears well-developed and well-nourished.  Cardiovascular: Normal rate.   Pulmonary/Chest: Effort normal.  Musculoskeletal: Normal range of motion.  Neurological: She is  oriented to person, place, and time.  Skin: Skin is warm and dry.  Psychiatric: She has a normal mood and affect. Her behavior is normal.  Vitals reviewed.   RECENT LABS AND TESTS: BMET    Component Value Date/Time   Laura Blackwell 142 12/31/2016 1032   K 4.1 12/31/2016 1032   CL 101 12/31/2016 1032   CO2 28 12/31/2016 1032   GLUCOSE 110 (H) 12/31/2016 1032   GLUCOSE 239 (H) 12/03/2015 0354   BUN 10 12/31/2016 1032   CREATININE 0.62 12/31/2016 1032   CALCIUM 9.5 12/31/2016 1032   GFRNONAA 100 12/31/2016 1032   GFRAA 115 12/31/2016 1032   Lab Results  Component Value Date   HGBA1C 8.0 (H) 12/31/2016   Lab Results  Component Value Date   INSULIN 21.3 12/31/2016   CBC    Component Value Date/Time   WBC 6.1 12/31/2016 1032   WBC 12.8 (H) 12/03/2015 0354   RBC 5.01 12/31/2016 1032   RBC 4.12 12/03/2015 0354   HGB 11.4 (L) 12/03/2015 0354   HCT 40.2 12/31/2016 1032   PLT 223 12/31/2016 1032   MCV 80 12/31/2016 1032   MCH 26.5 (L) 12/31/2016 1032   MCH 27.7 12/03/2015 0354   MCHC 33.1 12/31/2016 1032   MCHC 33.3 12/03/2015 0354   RDW 16.3 (H) 12/31/2016 1032   LYMPHSABS 1.2 12/31/2016 1032   EOSABS 0.1 12/31/2016 1032   BASOSABS 0.0 12/31/2016 1032   Iron/TIBC/Ferritin/ %Sat No results found for: IRON, TIBC, FERRITIN, IRONPCTSAT Lipid Panel     Component Value Date/Time   CHOL 148 12/31/2016 1032   TRIG 61 12/31/2016 1032   HDL 58 12/31/2016 1032   LDLCALC 78 12/31/2016 1032   Hepatic Function Panel     Component Value Date/Time   PROT 6.8 12/31/2016 1032   ALBUMIN 3.8 12/31/2016 1032   AST 10 12/31/2016 1032   ALT 14 12/31/2016 1032   ALKPHOS 76 12/31/2016 1032   BILITOT 0.2 12/31/2016 1032      Component Value Date/Time   TSH 0.508 12/31/2016 1032    ASSESSMENT AND PLAN: Type 2 diabetes mellitus without complication, without long-term current use of insulin (HCC) - Plan: liraglutide 18 MG/3ML SOPN  Vitamin D deficiency - Plan: Vitamin D, Ergocalciferol,  (DRISDOL) 50000 units CAPS capsule  At risk for heart disease  Morbid obesity ([place])  PLAN:  Vitamin D Deficiency Laura Blackwell was informed that low vitamin D levels contributes to fatigue and are associated with obesity, breast, and colon cancer. She agrees to start to take prescription Vit D @50 ,000 IU every week #4 with no refills and will follow up for routine testing of vitamin D, at least 2-3 times per year. She was informed of the risk of over-replacement of vitamin D and agrees to not increase her  dose unless he discusses this with [place] first. Laura Blackwell agrees to follow up with our clinic in 2 weeks.  Diabetes II Laura Blackwell has been given extensive diabetes education by myself today including ideal fasting and post-prandial blood glucose readings, individual ideal Hgb Ab1c goals and hypoglycemia prevention. We discussed the importance of good blood sugar control to decrease the likelihood of diabetic complications such as nephropathy, neuropathy, limb loss, blindness, coronary artery disease, and death. We discussed the importance of intensive lifestyle modification including diet, exercise and weight loss as the first line treatment for diabetes. Laura Blackwell agrees to continue Metformin and start to take Victoza 0.6 mg every morning #1 pak with no refills and will follow up at the agreed upon time.  Cardiovascular risk counselling Laura Blackwell was given extended (at least 30 minutes) coronary artery disease prevention counseling today. She is 61 y.o. female and has risk factors for heart disease including obesity and diabetes. We discussed intensive lifestyle modifications today with an emphasis on specific weight loss instructions and strategies. Pt was also informed of the importance of increasing exercise and decreasing saturated fats to help prevent heart disease.  Obesity Laura Blackwell is currently in the action stage of change. As such, her goal is to continue with weight loss efforts She has agreed to follow the  Category 2 plan +200 calories Laura Blackwell has been instructed to work up to a goal of 150 minutes of combined cardio and strengthening exercise per week for weight loss and overall health benefits. We discussed the following Behavioral Modification Stratagies today: no skipping meals, increasing lean protein intake, decreasing simple carbohydrates and eating all her meal plan  Laura Blackwell has agreed to follow up with our clinic in 2 weeks. She was informed of the importance of frequent follow up visits to maximize her success with intensive lifestyle modifications for her multiple health conditions.  I, Doreene Nest, am acting as scribe for Dennard Nip, MD  I have reviewed the above documentation for accuracy and completeness, and I agree with the above. -Dennard Nip, MD

## 2017-01-18 MED ORDER — INSULIN PEN NEEDLE 32G X 4 MM MISC: 0 refills | Status: AC

## 2017-01-19 ENCOUNTER — Ambulatory Visit: Payer: 59 | Admitting: Occupational Therapy

## 2017-01-28 ENCOUNTER — Ambulatory Visit (INDEPENDENT_AMBULATORY_CARE_PROVIDER_SITE_OTHER): Payer: 59 | Admitting: Family Medicine

## 2017-01-28 VITALS — BP 131/83 | HR 88 | Temp 97.9°F | Ht 67.0 in | Wt 299.0 lb

## 2017-01-28 DIAGNOSIS — E559 Vitamin D deficiency, unspecified: Secondary | ICD-10-CM | POA: Diagnosis not present

## 2017-01-28 DIAGNOSIS — E119 Type 2 diabetes mellitus without complications: Secondary | ICD-10-CM

## 2017-01-28 DIAGNOSIS — Z6841 Body Mass Index (BMI) 40.0 and over, adult: Secondary | ICD-10-CM | POA: Diagnosis not present

## 2017-01-28 NOTE — Progress Notes (Signed)
Office: [phone]  /  Fax: [phone]   HPI:   Chief Complaint: OBESITY Laura Blackwell is here to discuss her progress with her obesity treatment plan. She is on the  follow the Category 2 plan + 200 calories and is following her eating plan approximately 90 % of the time. She states she is exercising 0 minutes 0 times per week. [name] has been deviating from plan more and eating out more due to busy work schedule. She is ready to get back on track. Her weight is 299 lb (135.6 kg) today and has had a weight gain of 1 pound over a period of 2 weeks since her last visit. She has lost 4 lbs since starting treatment with [place].  Vitamin D deficiency Laura Blackwell has a diagnosis of vitamin D deficiency. She is currently taking vit D and denies nausea, vomiting or muscle weakness.  Diabetes II Laura Blackwell has a diagnosis of diabetes type II. Laura Blackwell states BGs range between 100 and 160's and denies any hypoglycemic episodes. Laura Blackwell states blood sugar numbers are down with diet and exercise. She has been working on intensive lifestyle modifications including diet, exercise, and weight loss to help control her blood glucose levels.   ALLERGIES: No Known Allergies  MEDICATIONS: Current Outpatient Prescriptions on File Prior to Visit  Medication Sig Dispense Refill  . amLODipine (NORVASC) 5 MG tablet Take 5 mg by mouth daily.     Marland Kitchen atorvastatin (LIPITOR) 20 MG tablet Take 20 mg by mouth daily.     . furosemide (LASIX) 20 MG tablet Take 20 mg by mouth 2 (two) times daily.     Marland Kitchen glucose blood (ONETOUCH VERIO) test strip 1 each by Other route 2 (two) times daily. Use as instructed    . Insulin Pen Needle 32G X 4 MM MISC Use once daily as directed 100 each 0  . liraglutide 18 MG/3ML SOPN Inject 0.1 mLs (0.6 mg total) into the skin every morning. 2 pen 0  . losartan (COZAAR) 100 MG tablet Take 100 mg by mouth every morning.     . metFORMIN (GLUCOPHAGE) 500 MG tablet Take 1,000 mg by mouth 2 (two) times daily with a  meal.     . Vitamin D, Ergocalciferol, (DRISDOL) 50000 units CAPS capsule Take 1 capsule (50,000 Units total) by mouth every 7 (seven) days. 4 capsule 0   No current facility-administered medications on file prior to visit.     PAST MEDICAL HISTORY: Past Medical History:  Diagnosis Date  . Arthritis   . Diabetes mellitus without complication ([place])   . Edema    in left leg in 1980s on left ankle   . Hypertension   . PCOS (polycystic ovarian syndrome)   . Swelling   . Vitamin D deficiency     PAST SURGICAL HISTORY: Past Surgical History:  Procedure Laterality Date  . JOINT REPLACEMENT  2011    right hip   . left ankle surgery     . TOTAL HIP ARTHROPLASTY Left 12/02/2015   Procedure: LEFT TOTAL HIP ARTHROPLASTY ANTERIOR APPROACH;  Surgeon: [name], MD;  Location: [facility];  Service: Orthopedics;  Laterality: Left;    SOCIAL HISTORY: Social History  Substance Use Topics  . Smoking status: Former Smoker    Quit date: 10/01/2002  . Smokeless tobacco: Never Used  . Alcohol use No    FAMILY HISTORY: Family History  Problem Relation Age of Onset  . Diabetes Mother   . Hypertension Mother     ROS:  Review of Systems  Constitutional: Negative for malaise/fatigue.  Gastrointestinal: Negative for nausea and vomiting.  Musculoskeletal:       Negative muscle weakness  Endo/Heme/Allergies:       Negative hypoglycemia    PHYSICAL EXAM: Blood pressure 131/83, pulse 88, temperature 97.9 F (36.6 C), temperature source Oral, height 5\' 7"  (1.702 m), weight 299 lb (135.6 kg), SpO2 98 %. Body mass index is 46.83 kg/m. Physical Exam  Constitutional: She is oriented to person, place, and time. She appears well-developed and well-nourished.  Cardiovascular: Normal rate.   Pulmonary/Chest: Effort normal.  Musculoskeletal: Normal range of motion.  Neurological: She is oriented to person, place, and time.  Skin: Skin is warm and dry.  Psychiatric: She has a normal mood and  affect. Her behavior is normal.  Vitals reviewed.   RECENT LABS AND TESTS: BMET    Component Value Date/Time   NA 142 12/31/2016 1032   K 4.1 12/31/2016 1032   CL 101 12/31/2016 1032   CO2 28 12/31/2016 1032   GLUCOSE 110 (H) 12/31/2016 1032   GLUCOSE 239 (H) 12/03/2015 0354   BUN 10 12/31/2016 1032   CREATININE 0.62 12/31/2016 1032   CALCIUM 9.5 12/31/2016 1032   GFRNONAA 100 12/31/2016 1032   GFRAA 115 12/31/2016 1032   Lab Results  Component Value Date   HGBA1C 8.0 (H) 12/31/2016   Lab Results  Component Value Date   INSULIN 21.3 12/31/2016   CBC    Component Value Date/Time   WBC 6.1 12/31/2016 1032   WBC 12.8 (H) 12/03/2015 0354   RBC 5.01 12/31/2016 1032   RBC 4.12 12/03/2015 0354   HGB 11.4 (L) 12/03/2015 0354   HCT 40.2 12/31/2016 1032   PLT 223 12/31/2016 1032   MCV 80 12/31/2016 1032   MCH 26.5 (L) 12/31/2016 1032   MCH 27.7 12/03/2015 0354   MCHC 33.1 12/31/2016 1032   MCHC 33.3 12/03/2015 0354   RDW 16.3 (H) 12/31/2016 1032   LYMPHSABS 1.2 12/31/2016 1032   EOSABS 0.1 12/31/2016 1032   BASOSABS 0.0 12/31/2016 1032   Iron/TIBC/Ferritin/ %Sat No results found for: IRON, TIBC, FERRITIN, IRONPCTSAT Lipid Panel     Component Value Date/Time   CHOL 148 12/31/2016 1032   TRIG 61 12/31/2016 1032   HDL 58 12/31/2016 1032   LDLCALC 78 12/31/2016 1032   Hepatic Function Panel     Component Value Date/Time   PROT 6.8 12/31/2016 1032   ALBUMIN 3.8 12/31/2016 1032   AST 10 12/31/2016 1032   ALT 14 12/31/2016 1032   ALKPHOS 76 12/31/2016 1032   BILITOT 0.2 12/31/2016 1032      Component Value Date/Time   TSH 0.508 12/31/2016 1032    ASSESSMENT AND PLAN: Type 2 diabetes mellitus without complication, without long-term current use of insulin (HCC)  Vitamin D deficiency  Morbid obesity ([place])  PLAN:  Vitamin D Deficiency Laura Blackwell was informed that low vitamin D levels contributes to fatigue and are associated with obesity, breast, and colon  cancer. She agrees to continue to take prescription Vit D @50 ,000 IU every week and will follow up for routine testing of vitamin D, at least 2-3 times per year. She was informed of the risk of over-replacement of vitamin D and agrees to not increase her dose unless he discusses this with [place] first.  Diabetes II Laura Blackwell has been given extensive diabetes education by myself today including ideal fasting and post-prandial blood glucose readings, individual ideal Hgb A1c goals  and hypoglycemia  prevention. We discussed the importance of good blood sugar control to decrease the likelihood of diabetic complications such as nephropathy, neuropathy, limb loss, blindness, coronary artery disease, and death. We discussed the importance of intensive lifestyle modification including diet, exercise and weight loss as the first line treatment for diabetes. Laura Blackwell agrees to continue her diabetes medications and will follow up at the agreed upon time.  We spent > than 50% of the 15 minute visit on the counseling as documented in the note.  Obesity Laura Blackwell is currently in the action stage of change. As such, her goal is to continue with weight loss efforts She has agreed to follow the Category 2 plan Laura Blackwell has been instructed to work up to a goal of 150 minutes of combined cardio and strengthening exercise per week for weight loss and overall health benefits. We discussed the following Behavioral Modification Strategies today: increasing lean protein intake, work on meal planning and easy cooking plans and decrease junk food  Laura Blackwell has agreed to follow up with our clinic in 2 weeks. She was informed of the importance of frequent follow up visits to maximize her success with intensive lifestyle modifications for her multiple health conditions.  I, Doreene Nest, am acting as scribe for Dennard Nip, MD  I have reviewed the above documentation for accuracy and completeness, and I agree with the above. -Dennard Nip, MD  OBESITY BEHAVIORAL INTERVENTION VISIT  Today's visit was # 3 out of 22.  Starting weight: 303 lbs Starting date: 12/31/16 Today's weight : 299 lbs Today's date: 01/28/2017 Total lbs lost to date: 4 (Patients must lose 7 lbs in the first 6 months to continue with counseling)   ASK: We discussed the diagnosis of obesity with Rockne Menghini today and Shamica agreed to give [place] permission to discuss obesity behavioral modification therapy today.  ASSESS: Nesha has the diagnosis of obesity and her BMI today is 46.9 Daphanie is in the action stage of change   ADVISE: Ceniya was educated on the multiple health risks of obesity as well as the benefit of weight loss to improve her health. She was advised of the need for long term treatment and the importance of lifestyle modifications.  AGREE: Multiple dietary modification options and treatment options were discussed and  Shabria agreed to follow the Category 2 plan We discussed the following Behavioral Modification Strategies today: increasing lean protein intake, work on meal planning and easy cooking plans and decrease junk food

## 2017-02-17 ENCOUNTER — Ambulatory Visit (INDEPENDENT_AMBULATORY_CARE_PROVIDER_SITE_OTHER): Payer: 59 | Admitting: Family Medicine

## 2017-02-17 VITALS — BP 124/74 | HR 66 | Temp 98.2°F | Ht 67.0 in | Wt 300.0 lb

## 2017-02-17 DIAGNOSIS — E119 Type 2 diabetes mellitus without complications: Secondary | ICD-10-CM | POA: Diagnosis not present

## 2017-02-17 DIAGNOSIS — E559 Vitamin D deficiency, unspecified: Secondary | ICD-10-CM

## 2017-02-17 DIAGNOSIS — Z6841 Body Mass Index (BMI) 40.0 and over, adult: Secondary | ICD-10-CM | POA: Diagnosis not present

## 2017-02-17 MED ORDER — VITAMIN D (ERGOCALCIFEROL) 1.25 MG (50000 UNIT) PO CAPS: 50000.0000 [IU] | ORAL_CAPSULE | ORAL | 0 refills | Status: DC

## 2017-02-17 NOTE — Progress Notes (Signed)
Office: [phone]  /  Fax: [phone]   HPI:   Chief Complaint: OBESITY Laura Blackwell is here to discuss her progress with her obesity treatment plan. She is on the  follow the Category 2 plan and is following her eating plan approximately 50 % of the time. She states she is walking more for exercise. [name] has had increased eating out. She is ready to get back on track. Her weight is 300 lb (136.1 kg) today and has had a weight gain of 1 pound over a period of 3 weeks since her last visit. She has lost 3 lbs since starting treatment with [place].  Vitamin D deficiency Laura Blackwell has a diagnosis of vitamin D deficiency. She is currently taking vit D and denies nausea, vomiting or muscle weakness.  Diabetes II Laura Blackwell has a diagnosis of diabetes type II. Laura Blackwell states BGs range between 101 and 129 and post prandial range between 98 and 180 and denies polyphagia or any hypoglycemic episodes. Last A1c was at 8.0 She has been working on intensive lifestyle modifications including diet, exercise, and weight loss to help control her blood glucose levels.   ALLERGIES: No Known Allergies  MEDICATIONS: Current Outpatient Prescriptions on File Prior to Visit  Medication Sig Dispense Refill  . amLODipine (NORVASC) 5 MG tablet Take 5 mg by mouth daily.     Marland Kitchen atorvastatin (LIPITOR) 20 MG tablet Take 20 mg by mouth daily.     . furosemide (LASIX) 20 MG tablet Take 20 mg by mouth 2 (two) times daily.     Marland Kitchen glucose blood (ONETOUCH VERIO) test strip 1 each by Other route 2 (two) times daily. Use as instructed    . Insulin Pen Needle 32G X 4 MM MISC Use once daily as directed 100 each 0  . losartan (COZAAR) 100 MG tablet Take 100 mg by mouth every morning.     . metFORMIN (GLUCOPHAGE) 500 MG tablet Take 1,000 mg by mouth 2 (two) times daily with a meal.     . liraglutide 18 MG/3ML SOPN Inject 0.1 mLs (0.6 mg total) into the skin every morning. 2 pen 0   No current facility-administered medications on file  prior to visit.     PAST MEDICAL HISTORY: Past Medical History:  Diagnosis Date  . Arthritis   . Diabetes mellitus without complication ([place])   . Edema    in left leg in 1980s on left ankle   . Hypertension   . PCOS (polycystic ovarian syndrome)   . Swelling   . Vitamin D deficiency     PAST SURGICAL HISTORY: Past Surgical History:  Procedure Laterality Date  . JOINT REPLACEMENT  2011    right hip   . left ankle surgery     . TOTAL HIP ARTHROPLASTY Left 12/02/2015   Procedure: LEFT TOTAL HIP ARTHROPLASTY ANTERIOR APPROACH;  Surgeon: [name], MD;  Location: [facility];  Service: Orthopedics;  Laterality: Left;    SOCIAL HISTORY: Social History  Substance Use Topics  . Smoking status: Former Smoker    Quit date: 10/01/2002  . Smokeless tobacco: Never Used  . Alcohol use No    FAMILY HISTORY: Family History  Problem Relation Age of Onset  . Diabetes Mother   . Hypertension Mother     ROS: Review of Systems  Constitutional: Negative for weight loss.  Gastrointestinal: Negative for nausea and vomiting.  Musculoskeletal:       Negative muscle weakness  Endo/Heme/Allergies:       Negative polyphagia  Negative hypoglycemia    PHYSICAL EXAM: Blood pressure 124/74, pulse 66, temperature 98.2 F (36.8 C), temperature source Oral, height 5\' 7"  (1.702 m), weight 300 lb (136.1 kg), SpO2 97 %. Body mass index is 46.99 kg/m. Physical Exam  Constitutional: She is oriented to person, place, and time. She appears well-developed and well-nourished.  Cardiovascular: Normal rate.   Pulmonary/Chest: Effort normal.  Musculoskeletal: Normal range of motion.  Neurological: She is oriented to person, place, and time.  Skin: Skin is warm and dry.  Psychiatric: She has a normal mood and affect. Her behavior is normal.  Vitals reviewed.   RECENT LABS AND TESTS: BMET    Component Value Date/Time   NA 142 12/31/2016 1032   K 4.1 12/31/2016 1032   CL 101 12/31/2016 1032   CO2  28 12/31/2016 1032   GLUCOSE 110 (H) 12/31/2016 1032   GLUCOSE 239 (H) 12/03/2015 0354   BUN 10 12/31/2016 1032   CREATININE 0.62 12/31/2016 1032   CALCIUM 9.5 12/31/2016 1032   GFRNONAA 100 12/31/2016 1032   GFRAA 115 12/31/2016 1032   Lab Results  Component Value Date   HGBA1C 8.0 (H) 12/31/2016   Lab Results  Component Value Date   INSULIN 21.3 12/31/2016   CBC    Component Value Date/Time   WBC 6.1 12/31/2016 1032   WBC 12.8 (H) 12/03/2015 0354   RBC 5.01 12/31/2016 1032   RBC 4.12 12/03/2015 0354   HGB 13.3 12/31/2016 1032   HCT 40.2 12/31/2016 1032   PLT 223 12/31/2016 1032   MCV 80 12/31/2016 1032   MCH 26.5 (L) 12/31/2016 1032   MCH 27.7 12/03/2015 0354   MCHC 33.1 12/31/2016 1032   MCHC 33.3 12/03/2015 0354   RDW 16.3 (H) 12/31/2016 1032   LYMPHSABS 1.2 12/31/2016 1032   EOSABS 0.1 12/31/2016 1032   BASOSABS 0.0 12/31/2016 1032   Iron/TIBC/Ferritin/ %Sat No results found for: IRON, TIBC, FERRITIN, IRONPCTSAT Lipid Panel     Component Value Date/Time   CHOL 148 12/31/2016 1032   TRIG 61 12/31/2016 1032   HDL 58 12/31/2016 1032   LDLCALC 78 12/31/2016 1032   Hepatic Function Panel     Component Value Date/Time   PROT 6.8 12/31/2016 1032   ALBUMIN 3.8 12/31/2016 1032   AST 10 12/31/2016 1032   ALT 14 12/31/2016 1032   ALKPHOS 76 12/31/2016 1032   BILITOT 0.2 12/31/2016 1032      Component Value Date/Time   TSH 0.508 12/31/2016 1032    ASSESSMENT AND PLAN: Vitamin D deficiency - Plan: Vitamin D, Ergocalciferol, (DRISDOL) 50000 units CAPS capsule  Type 2 diabetes mellitus without complication, without long-term current use of insulin (HCC)  Morbid obesity (HCC)  PLAN:  Vitamin D Deficiency [name] was informed that low vitamin D levels contributes to fatigue and are associated with obesity, breast, and colon cancer. She agrees to continue to take prescription Vit D @50 ,000 IU every week we will refill for 1 month and will follow up for  routine testing of vitamin D, at least 2-3 times per year. She was informed of the risk of over-replacement of vitamin D and agrees to not increase her dose unless he discusses this with [place] first. [name] agrees to follow up with our clinic in 3 weeks.  Diabetes II Shakora has been given extensive diabetes education by myself today including ideal fasting and post-prandial blood glucose readings, individual ideal HgA1c goals  and hypoglycemia prevention. We discussed the importance of good blood sugar  control to decrease the likelihood of diabetic complications such as nephropathy, neuropathy, limb loss, blindness, coronary artery disease, and death. We discussed the importance of intensive lifestyle modification including diet, exercise and weight loss as the first line treatment for diabetes. Samar agrees to continue victoza and metformin and will follow up at the agreed upon time.  Obesity Keona is currently in the action stage of change. As such, her goal is to continue with weight loss efforts She has agreed to follow the Category 2 plan Story has been instructed to work up to a goal of 150 minutes of combined cardio and strengthening exercise per week for weight loss and overall health benefits. We discussed the following Behavioral Modification Strategies today: increasing lean protein intake and decrease eating out  Ilse has agreed to follow up with our clinic in 3 weeks. She was informed of the importance of frequent follow up visits to maximize her success with intensive lifestyle modifications for her multiple health conditions.  I, Doreene Nest, am acting as transcriptionist for Dennard Nip, MD  I have reviewed the above documentation for accuracy and completeness, and I agree with the above. -Dennard Nip, MD  OBESITY BEHAVIORAL INTERVENTION VISIT  Today's visit was # 4 out of 22.  Starting weight: 303 lbs Starting date: 12/31/16 Today's weight : 300 lbs  Today's date:  02/17/2017 Total lbs lost to date: 3 (Patients must lose 7 lbs in the first 6 months to continue with counseling)   ASK: We discussed the diagnosis of obesity with Rockne Menghini today and Greg agreed to give [place] permission to discuss obesity behavioral modification therapy today.  ASSESS: Magdala has the diagnosis of obesity and her BMI today is 63.1 Keyla is in the action stage of change   ADVISE: Kyannah was educated on the multiple health risks of obesity as well as the benefit of weight loss to improve her health. She was advised of the need for long term treatment and the importance of lifestyle modifications.  AGREE: Multiple dietary modification options and treatment options were discussed and  Henya agreed to follow the Category 2 plan We discussed the following Behavioral Modification Strategies today: increasing lean protein intake and decrease eating out

## 2017-02-25 ENCOUNTER — Other Ambulatory Visit (HOSPITAL_COMMUNITY)
Admission: RE | Admit: 2017-02-25 | Discharge: 2017-02-25 | Disposition: A | Discharge status: Home / Self Care | Payer: 59 | Source: Ambulatory Visit | Attending: Family Medicine | Admitting: Family Medicine

## 2017-02-25 ENCOUNTER — Other Ambulatory Visit: Payer: Self-pay | Admitting: Family Medicine

## 2017-02-25 DIAGNOSIS — E1165 Type 2 diabetes mellitus with hyperglycemia: Secondary | ICD-10-CM | POA: Diagnosis not present

## 2017-02-25 DIAGNOSIS — Z1211 Encounter for screening for malignant neoplasm of colon: Secondary | ICD-10-CM | POA: Diagnosis not present

## 2017-02-25 DIAGNOSIS — Z124 Encounter for screening for malignant neoplasm of cervix: Secondary | ICD-10-CM | POA: Insufficient documentation

## 2017-02-25 DIAGNOSIS — Z0000 Encounter for general adult medical examination without abnormal findings: Secondary | ICD-10-CM | POA: Diagnosis not present

## 2017-02-25 DIAGNOSIS — I10 Essential (primary) hypertension: Secondary | ICD-10-CM | POA: Diagnosis not present

## 2017-02-25 DIAGNOSIS — E785 Hyperlipidemia, unspecified: Secondary | ICD-10-CM | POA: Diagnosis not present

## 2017-02-25 LAB — HEMOGLOBIN A1C: HEMOGLOBIN A1C: 7.5

## 2017-02-26 LAB — CYTOLOGY - PAP: Diagnosis: NEGATIVE

## 2017-03-10 ENCOUNTER — Ambulatory Visit (INDEPENDENT_AMBULATORY_CARE_PROVIDER_SITE_OTHER): Payer: 59 | Admitting: Family Medicine

## 2017-03-10 VITALS — BP 134/87 | HR 96 | Temp 97.9°F | Ht 67.0 in | Wt 301.0 lb

## 2017-03-10 DIAGNOSIS — Z6841 Body Mass Index (BMI) 40.0 and over, adult: Secondary | ICD-10-CM

## 2017-03-10 DIAGNOSIS — E669 Obesity, unspecified: Secondary | ICD-10-CM | POA: Diagnosis not present

## 2017-03-10 DIAGNOSIS — E119 Type 2 diabetes mellitus without complications: Secondary | ICD-10-CM

## 2017-03-10 DIAGNOSIS — IMO0001 Reserved for inherently not codable concepts without codable children: Secondary | ICD-10-CM

## 2017-03-10 MED ORDER — LIRAGLUTIDE 18 MG/3ML ~~LOC~~ SOPN: 0.6000 mg | PEN_INJECTOR | Freq: Every morning | SUBCUTANEOUS | 0 refills | Status: DC

## 2017-03-10 NOTE — Progress Notes (Signed)
Office: [phone]  /  Fax: [phone]   HPI:   Chief Complaint: OBESITY Laura Blackwell is here to discuss her progress with her obesity treatment plan. She is on the  follow the Category 2 plan and is following her eating plan approximately 55 % of the time. She states she is walking extra steps when parking. Laura Blackwell if off track with increased dietary indiscretions. She is struggling with motivation and planning ahead as well as increased temptations. Her weight is (!) 301 lb (136.5 kg) today and has had a weight gain of 1 pound over a period of 3 weeks since her last visit. She has lost 2 lbs since starting treatment with [place].  Diabetes II Laura Blackwell has a diagnosis of diabetes type II. She is stable on victoza, had an A1c at PCP office yesterday but doesn't have results back yet. Laura Blackwell has decreased polyphagia and denies any hypoglycemic episodes. She has been working on intensive lifestyle modifications including diet, exercise, and weight loss to help control her blood glucose levels.  ALLERGIES: No Known Allergies  MEDICATIONS: Current Outpatient Prescriptions on File Prior to Visit  Medication Sig Dispense Refill   amLODipine (NORVASC) 5 MG tablet Take 5 mg by mouth daily.      atorvastatin (LIPITOR) 20 MG tablet Take 20 mg by mouth daily.      furosemide (LASIX) 20 MG tablet Take 20 mg by mouth 2 (two) times daily.      glucose blood (ONETOUCH VERIO) test strip 1 each by Other route 2 (two) times daily. Use as instructed     losartan (COZAAR) 100 MG tablet Take 100 mg by mouth every morning.      metFORMIN (GLUCOPHAGE) 500 MG tablet Take 1,000 mg by mouth 2 (two) times daily with a meal.      Vitamin D, Ergocalciferol, (DRISDOL) 50000 units CAPS capsule Take 1 capsule (50,000 Units total) by mouth every 7 (seven) days. 4 capsule 0   No current facility-administered medications on file prior to visit.     PAST MEDICAL HISTORY: Past Medical History:  Diagnosis Date    Arthritis    Diabetes mellitus without complication ([place])    Edema    in left leg in 1980s on left ankle    Hypertension    PCOS (polycystic ovarian syndrome)    Swelling    Vitamin D deficiency     PAST SURGICAL HISTORY: Past Surgical History:  Procedure Laterality Date   JOINT REPLACEMENT  2011    right hip    left ankle surgery      TOTAL HIP ARTHROPLASTY Left 12/02/2015   Procedure: LEFT TOTAL HIP ARTHROPLASTY ANTERIOR APPROACH;  Surgeon: [name], MD;  Location: [facility];  Service: Orthopedics;  Laterality: Left;    SOCIAL HISTORY: Social History  Substance Use Topics   Smoking status: Former Smoker    Quit date: 10/01/2002   Smokeless tobacco: Never Used   Alcohol use No    FAMILY HISTORY: Family History  Problem Relation Age of Onset   Diabetes Mother    Hypertension Mother     ROS: Review of Systems  Constitutional: Negative for weight loss.  Endo/Heme/Allergies:       Polyphagia Negative hypoglycemia    PHYSICAL EXAM: Blood pressure 134/87, pulse 96, temperature 97.9 F (36.6 C), temperature source Oral, height 5\' 7"  (1.702 m), weight (!) 301 lb (136.5 kg), SpO2 98 %. Body mass index is 47.14 kg/m. Physical Exam  Constitutional: She is oriented to person, place,  and time. She appears well-developed and well-nourished.  Cardiovascular: Normal rate.   Pulmonary/Chest: Effort normal.  Musculoskeletal: Normal range of motion.  Neurological: She is oriented to person, place, and time.  Skin: Skin is warm and dry.  Psychiatric: She has a normal mood and affect. Her behavior is normal.  Vitals reviewed.   RECENT LABS AND TESTS: BMET    Component Value Date/Time   NA 142 12/31/2016 1032   K 4.1 12/31/2016 1032   CL 101 12/31/2016 1032   CO2 28 12/31/2016 1032   GLUCOSE 110 (H) 12/31/2016 1032   GLUCOSE 239 (H) 12/03/2015 0354   BUN 10 12/31/2016 1032   CREATININE 0.62 12/31/2016 1032   CALCIUM 9.5 12/31/2016 1032   GFRNONAA 100  12/31/2016 1032   GFRAA 115 12/31/2016 1032   Lab Results  Component Value Date   HGBA1C 8.0 (H) 12/31/2016   Lab Results  Component Value Date   INSULIN 21.3 12/31/2016   CBC    Component Value Date/Time   WBC 6.1 12/31/2016 1032   WBC 12.8 (H) 12/03/2015 0354   RBC 5.01 12/31/2016 1032   RBC 4.12 12/03/2015 0354   HGB 13.3 12/31/2016 1032   HCT 40.2 12/31/2016 1032   PLT 223 12/31/2016 1032   MCV 80 12/31/2016 1032   MCH 26.5 (L) 12/31/2016 1032   MCH 27.7 12/03/2015 0354   MCHC 33.1 12/31/2016 1032   MCHC 33.3 12/03/2015 0354   RDW 16.3 (H) 12/31/2016 1032   LYMPHSABS 1.2 12/31/2016 1032   EOSABS 0.1 12/31/2016 1032   BASOSABS 0.0 12/31/2016 1032   Iron/TIBC/Ferritin/ %Sat No results found for: IRON, TIBC, FERRITIN, IRONPCTSAT Lipid Panel     Component Value Date/Time   CHOL 148 12/31/2016 1032   TRIG 61 12/31/2016 1032   HDL 58 12/31/2016 1032   LDLCALC 78 12/31/2016 1032   Hepatic Function Panel     Component Value Date/Time   PROT 6.8 12/31/2016 1032   ALBUMIN 3.8 12/31/2016 1032   AST 10 12/31/2016 1032   ALT 14 12/31/2016 1032   ALKPHOS 76 12/31/2016 1032   BILITOT 0.2 12/31/2016 1032      Component Value Date/Time   TSH 0.508 12/31/2016 1032    ASSESSMENT AND PLAN: Type 2 diabetes mellitus without complication, without long-term current use of insulin (HCC) - Plan: liraglutide 18 MG/3ML SOPN  Class 3 obesity with serious comorbidity and body mass index (BMI) of 45.0 to 49.9 in adult, unspecified obesity type ([place])  PLAN:  Diabetes II Laura Blackwell has been given extensive diabetes education by myself today including ideal fasting and post-prandial blood glucose readings, individual ideal Hgb A1c goals  and hypoglycemia prevention. We discussed the importance of good blood sugar control to decrease the likelihood of diabetic complications such as nephropathy, neuropathy, limb loss, blindness, coronary artery disease, and death. We discussed the  importance of intensive lifestyle modification including diet, exercise and weight loss as the first line treatment for diabetes. We will get A1c results from PCP and Laura Blackwell agrees to continue victoza, we will refill for 1 month and she will follow up at the agreed upon time.  Obesity Laura Blackwell is currently in the action stage of change. As such, her goal is to continue with weight loss efforts She has agreed to follow the Category 2 plan and journal all food Laura Blackwell has been instructed to work up to a goal of 150 minutes of combined cardio and strengthening exercise per week for weight loss and overall health benefits.  We discussed the following Behavioral Modification Strategies today: better snacking choices, increasing lean protein intake and emotional eating strategies  Laura Blackwell has agreed to follow up with our clinic in 2 weeks. She was informed of the importance of frequent follow up visits to maximize her success with intensive lifestyle modifications for her multiple health conditions.  I, Doreene Nest, am acting as transcriptionist for Dennard Nip, MD  I have reviewed the above documentation for accuracy and completeness, and I agree with the above. -Dennard Nip, MD  OBESITY BEHAVIORAL INTERVENTION VISIT  Today's visit was # 5 out of 22.  Starting weight: 303 lbs Starting date: 12/31/16 Today's weight : 301 lbs  Today's date: 03/10/2017 Total lbs lost to date: 2 (Patients must lose 7 lbs in the first 6 months to continue with counseling)   ASK: We discussed the diagnosis of obesity with Laura Blackwell today and Laura Blackwell agreed to give [place] permission to discuss obesity behavioral modification therapy today.  ASSESS: Laura Blackwell has the diagnosis of obesity and her BMI today is 16.2 Laura Blackwell is in the action stage of change   ADVISE: Laura Blackwell was educated on the multiple health risks of obesity as well as the benefit of weight loss to improve her health. She was advised of the need  for long term treatment and the importance of lifestyle modifications.  AGREE: Multiple dietary modification options and treatment options were discussed and  Laura Blackwell agreed to follow the Category 2 plan and journal all food We discussed the following Behavioral Modification Strategies today: better snacking choices, increasing lean protein intake and emotional eating strategies

## 2017-03-17 ENCOUNTER — Encounter (INDEPENDENT_AMBULATORY_CARE_PROVIDER_SITE_OTHER): Payer: Self-pay

## 2017-03-29 ENCOUNTER — Ambulatory Visit (INDEPENDENT_AMBULATORY_CARE_PROVIDER_SITE_OTHER): Payer: 59 | Admitting: Family Medicine

## 2017-03-29 VITALS — BP 132/83 | HR 96 | Temp 98.4°F | Ht 67.0 in | Wt 297.0 lb

## 2017-03-29 DIAGNOSIS — E559 Vitamin D deficiency, unspecified: Secondary | ICD-10-CM

## 2017-03-29 DIAGNOSIS — E119 Type 2 diabetes mellitus without complications: Secondary | ICD-10-CM

## 2017-03-29 DIAGNOSIS — Z6841 Body Mass Index (BMI) 40.0 and over, adult: Secondary | ICD-10-CM | POA: Diagnosis not present

## 2017-03-29 DIAGNOSIS — IMO0001 Reserved for inherently not codable concepts without codable children: Secondary | ICD-10-CM

## 2017-03-29 DIAGNOSIS — E669 Obesity, unspecified: Secondary | ICD-10-CM

## 2017-03-29 MED ORDER — VITAMIN D (ERGOCALCIFEROL) 1.25 MG (50000 UNIT) PO CAPS: 50000.0000 [IU] | ORAL_CAPSULE | ORAL | 0 refills | Status: DC

## 2017-03-29 MED ORDER — LIRAGLUTIDE 18 MG/3ML ~~LOC~~ SOPN: 1.2000 mg | PEN_INJECTOR | Freq: Every morning | SUBCUTANEOUS | 0 refills | Status: DC

## 2017-03-30 NOTE — Progress Notes (Signed)
Office: [phone]  /  Fax: [phone]   HPI:   Chief Complaint: OBESITY Laura Blackwell is here to discuss her progress with her obesity treatment plan. She is on the  follow the Category 2 plan and is following her eating plan approximately 60 % of the time. She states she is exercising 0 minutes 0 times per week. Laura Blackwell has done better with decreasing simple carbohydrates. She is journaling on and off and is working on increasing lean protein and vegetables. She has questions about intermittent fasting and if this would help her. Her weight is 297 lb (134.7 kg) today and has had a weight loss of 4 pounds over a period of 2 weeks since her last visit. She has lost 6 lbs since starting treatment with [place].  Vitamin D deficiency Laura Blackwell has a diagnosis of vitamin D deficiency. She is currently stable on vit D but not yet at goal. Her fatigues is improving and she denies nausea, vomiting or muscle weakness.  Diabetes II Laura Blackwell has a diagnosis of diabetes type II. Laura Blackwell notes BGs range between 130 and 140 and denies any hypoglycemic episodes. Laura Blackwell started victoza at last visit, not yet controlled and she denies nausea or vomiting. She has been working on intensive lifestyle modifications including diet, exercise, and weight loss to help control her blood glucose levels.   ALLERGIES: No Known Allergies  MEDICATIONS: Current Outpatient Prescriptions on File Prior to Visit  Medication Sig Dispense Refill  . amLODipine (NORVASC) 5 MG tablet Take 5 mg by mouth daily.     Marland Kitchen atorvastatin (LIPITOR) 20 MG tablet Take 20 mg by mouth daily.     . furosemide (LASIX) 20 MG tablet Take 20 mg by mouth 2 (two) times daily.     Marland Kitchen glucose blood (ONETOUCH VERIO) test strip 1 each by Other route 2 (two) times daily. Use as instructed    . losartan (COZAAR) 100 MG tablet Take 100 mg by mouth every morning.     . metFORMIN (GLUCOPHAGE) 500 MG tablet Take 1,000 mg by mouth 2 (two) times daily with a meal.       No current facility-administered medications on file prior to visit.     PAST MEDICAL HISTORY: Past Medical History:  Diagnosis Date  . Arthritis   . Diabetes mellitus without complication ([place])   . Edema    in left leg in 1980s on left ankle   . Hypertension   . PCOS (polycystic ovarian syndrome)   . Swelling   . Vitamin D deficiency     PAST SURGICAL HISTORY: Past Surgical History:  Procedure Laterality Date  . JOINT REPLACEMENT  2011    right hip   . left ankle surgery     . TOTAL HIP ARTHROPLASTY Left 12/02/2015   Procedure: LEFT TOTAL HIP ARTHROPLASTY ANTERIOR APPROACH;  Surgeon: [name], MD;  Location: [facility];  Service: Orthopedics;  Laterality: Left;    SOCIAL HISTORY: Social History  Substance Use Topics  . Smoking status: Former Smoker    Quit date: 10/01/2002  . Smokeless tobacco: Never Used  . Alcohol use No    FAMILY HISTORY: Family History  Problem Relation Age of Onset  . Diabetes Mother   . Hypertension Mother     ROS: Review of Systems  Constitutional: Positive for malaise/fatigue and weight loss.  Gastrointestinal: Negative for nausea and vomiting.  Musculoskeletal:       Negative muscle weakness  Endo/Heme/Allergies:       Negative hypoglycemia  PHYSICAL EXAM: Blood pressure 132/83, pulse 96, temperature 98.4 F (36.9 C), temperature source Oral, height 5\' 7"  (1.702 m), weight 297 lb (134.7 kg), SpO2 97 %. Body mass index is 46.52 kg/m. Physical Exam  Constitutional: She is oriented to person, place, and time. She appears well-developed and well-nourished.  Cardiovascular: Normal rate.   Pulmonary/Chest: Effort normal.  Musculoskeletal: Normal range of motion.  Neurological: She is oriented to person, place, and time.  Skin: Skin is warm and dry.  Psychiatric: She has a normal mood and affect. Her behavior is normal.  Vitals reviewed.   RECENT LABS AND TESTS: BMET    Component Value Date/Time   NA 142 12/31/2016 1032    K 4.1 12/31/2016 1032   CL 101 12/31/2016 1032   CO2 28 12/31/2016 1032   GLUCOSE 110 (H) 12/31/2016 1032   GLUCOSE 239 (H) 12/03/2015 0354   BUN 10 12/31/2016 1032   CREATININE 0.62 12/31/2016 1032   CALCIUM 9.5 12/31/2016 1032   GFRNONAA 100 12/31/2016 1032   GFRAA 115 12/31/2016 1032   Lab Results  Component Value Date   HGBA1C 7.5 02/25/2017   HGBA1C 8.0 (H) 12/31/2016   Lab Results  Component Value Date   INSULIN 21.3 12/31/2016   CBC    Component Value Date/Time   WBC 6.1 12/31/2016 1032   WBC 12.8 (H) 12/03/2015 0354   RBC 5.01 12/31/2016 1032   RBC 4.12 12/03/2015 0354   HGB 13.3 12/31/2016 1032   HCT 40.2 12/31/2016 1032   PLT 223 12/31/2016 1032   MCV 80 12/31/2016 1032   MCH 26.5 (L) 12/31/2016 1032   MCH 27.7 12/03/2015 0354   MCHC 33.1 12/31/2016 1032   MCHC 33.3 12/03/2015 0354   RDW 16.3 (H) 12/31/2016 1032   LYMPHSABS 1.2 12/31/2016 1032   EOSABS 0.1 12/31/2016 1032   BASOSABS 0.0 12/31/2016 1032   Iron/TIBC/Ferritin/ %Sat No results found for: IRON, TIBC, FERRITIN, IRONPCTSAT Lipid Panel     Component Value Date/Time   CHOL 148 12/31/2016 1032   TRIG 61 12/31/2016 1032   HDL 58 12/31/2016 1032   LDLCALC 78 12/31/2016 1032   Hepatic Function Panel     Component Value Date/Time   PROT 6.8 12/31/2016 1032   ALBUMIN 3.8 12/31/2016 1032   AST 10 12/31/2016 1032   ALT 14 12/31/2016 1032   ALKPHOS 76 12/31/2016 1032   BILITOT 0.2 12/31/2016 1032      Component Value Date/Time   TSH 0.508 12/31/2016 1032    ASSESSMENT AND PLAN: Vitamin D deficiency - Plan: Vitamin D, Ergocalciferol, (DRISDOL) 50000 units CAPS capsule  Type 2 diabetes mellitus without complication, without long-term current use of insulin (HCC) - Plan: liraglutide (VICTOZA) 18 MG/3ML SOPN  Class 3 obesity with serious comorbidity and body mass index (BMI) of 45.0 to 49.9 in adult, unspecified obesity type ([place])  PLAN:  Vitamin D Deficiency Laura Blackwell was informed that low  vitamin D levels contributes to fatigue and are associated with obesity, breast, and colon cancer. She agrees to continue to take prescription Vit D @50 ,000 IU every week, we will refill for 1 month and will follow up for routine testing of vitamin D, at least 2-3 times per year. She was informed of the risk of over-replacement of vitamin D and agrees to not increase her dose unless he discusses this with [place] first. Laura Blackwell agrees to follow up with our clinic in 2 weeks.  Diabetes II Laura Blackwell has been given extensive diabetes education by myself  today including ideal fasting and post-prandial blood glucose readings, individual ideal Hgb A1c goals  and hypoglycemia prevention. We discussed the importance of good blood sugar control to decrease the likelihood of diabetic complications such as nephropathy, neuropathy, limb loss, blindness, coronary artery disease, and death. We discussed the importance of intensive lifestyle modification including diet, exercise and weight loss as the first line treatment for diabetes. Laura Blackwell agrees to increase Victoza to 1.2 mg every morning and will check BGs 2 times per day. Laura Blackwell agrees to follow up at the agreed upon time.  Obesity Laura Blackwell is currently in the action stage of change. As such, her goal is to continue with weight loss efforts She has agreed to keep a food journal with 1200 to 1400 calories and 75+ grams of protein  Laura Blackwell has been instructed to work up to a goal of 150 minutes of combined cardio and strengthening exercise per week for weight loss and overall health benefits. We discussed the following Behavioral Modification Strategies today: no skipping meals, increasing lean protein intake and increasing vegetables  Laura Blackwell has agreed to follow up with our clinic in 2 weeks. She was informed of the importance of frequent follow up visits to maximize her success with intensive lifestyle modifications for her multiple health conditions.  I, Doreene Nest, am  acting as transcriptionist for Dennard Nip, MD  I have reviewed the above documentation for accuracy and completeness, and I agree with the above. -Dennard Nip, MD  OBESITY BEHAVIORAL INTERVENTION VISIT  Today's visit was # 6 out of 22.  Starting weight: 303 lbs Starting date: 12/31/16 Today's weight : 297 lbs Today's date: 03/29/2017 Total lbs lost to date: 6 (Patients must lose 7 lbs in the first 6 months to continue with counseling)   ASK: We discussed the diagnosis of obesity with Rockne Menghini today and Dyasia agreed to give [place] permission to discuss obesity behavioral modification therapy today.  ASSESS: Tionna has the diagnosis of obesity and her BMI today is 46.6 Patric is in the action stage of change   ADVISE: Blessing was educated on the multiple health risks of obesity as well as the benefit of weight loss to improve her health. She was advised of the need for long term treatment and the importance of lifestyle modifications.  AGREE: Multiple dietary modification options and treatment options were discussed and  Eun agreed to keep a food journal with 1200 to 1400 calories and 75+ grams of protein  We discussed the following Behavioral Modification Strategies today: no skipping meals, increasing lean protein intake and increasing vegetables

## 2017-04-12 ENCOUNTER — Ambulatory Visit (INDEPENDENT_AMBULATORY_CARE_PROVIDER_SITE_OTHER): Payer: 59 | Admitting: Family Medicine

## 2017-04-12 VITALS — BP 134/84 | HR 81 | Temp 98.3°F | Ht 67.0 in | Wt 299.0 lb

## 2017-04-12 DIAGNOSIS — E669 Obesity, unspecified: Secondary | ICD-10-CM | POA: Diagnosis not present

## 2017-04-12 DIAGNOSIS — E559 Vitamin D deficiency, unspecified: Secondary | ICD-10-CM

## 2017-04-12 DIAGNOSIS — Z6841 Body Mass Index (BMI) 40.0 and over, adult: Secondary | ICD-10-CM | POA: Diagnosis not present

## 2017-04-12 DIAGNOSIS — E119 Type 2 diabetes mellitus without complications: Secondary | ICD-10-CM | POA: Diagnosis not present

## 2017-04-12 DIAGNOSIS — Z9189 Other specified personal risk factors, not elsewhere classified: Secondary | ICD-10-CM | POA: Diagnosis not present

## 2017-04-12 DIAGNOSIS — IMO0001 Reserved for inherently not codable concepts without codable children: Secondary | ICD-10-CM

## 2017-04-12 MED ORDER — LIRAGLUTIDE 18 MG/3ML ~~LOC~~ SOPN: 1.2000 mg | PEN_INJECTOR | Freq: Every morning | SUBCUTANEOUS | 0 refills | Status: DC

## 2017-04-12 MED ORDER — VITAMIN D (ERGOCALCIFEROL) 1.25 MG (50000 UNIT) PO CAPS: 50000.0000 [IU] | ORAL_CAPSULE | ORAL | 0 refills | Status: DC

## 2017-04-12 NOTE — Progress Notes (Signed)
Office: [phone]  /  Fax: [phone]   HPI:   Chief Complaint: OBESITY Nerissa is here to discuss her progress with her obesity treatment plan. She is on the Category 2 plan and is following her eating plan approximately 60 % of the time. She states she is walking 10 to 15 minutes 5 times per week. Lyrique had increased celebration eating with her family and has eaten increased carbohydrates and salt and now feels bloated. She is ready to get back on track but is getting bored with her plan. Her weight is 299 lb (135.6 kg) today and has had a weight gain of 2 pounds over a period of 2 weeks since her last visit. She has lost 4 lbs since starting treatment with [place].  Vitamin D deficiency Charlaine has a diagnosis of vitamin D deficiency. She is currently stable on vit D and denies nausea, vomiting or muscle weakness.  Diabetes II Keeara has a diagnosis of diabetes type II and is currently stable. Her BGs improved with increased victoza dose. Elsie denies nausea, vomiting or any hypoglycemic episodes. She has been working on intensive lifestyle modifications including diet, exercise, and weight loss to help control her blood glucose levels.  At risk for cardiovascular disease Velvia is at a higher than average risk for cardiovascular disease due to obesity and diabetes. She currently denies any chest pain.  ALLERGIES: No Known Allergies  MEDICATIONS: Current Outpatient Prescriptions on File Prior to Visit  Medication Sig Dispense Refill   amLODipine (NORVASC) 5 MG tablet Take 5 mg by mouth daily.      atorvastatin (LIPITOR) 20 MG tablet Take 20 mg by mouth daily.      furosemide (LASIX) 20 MG tablet Take 20 mg by mouth 2 (two) times daily.      glucose blood (ONETOUCH VERIO) test strip 1 each by Other route 2 (two) times daily. Use as instructed     liraglutide (VICTOZA) 18 MG/3ML SOPN Inject 0.2 mLs (1.2 mg total) into the skin every morning. 2 pen 0   losartan (COZAAR) 100 MG  tablet Take 100 mg by mouth every morning.      metFORMIN (GLUCOPHAGE) 500 MG tablet Take 1,000 mg by mouth 2 (two) times daily with a meal.      Vitamin D, Ergocalciferol, (DRISDOL) 50000 units CAPS capsule Take 1 capsule (50,000 Units total) by mouth every 7 (seven) days. 4 capsule 0   No current facility-administered medications on file prior to visit.     PAST MEDICAL HISTORY: Past Medical History:  Diagnosis Date   Arthritis    Diabetes mellitus without complication ([place])    Edema    in left leg in 1980s on left ankle    Hypertension    PCOS (polycystic ovarian syndrome)    Swelling    Vitamin D deficiency     PAST SURGICAL HISTORY: Past Surgical History:  Procedure Laterality Date   JOINT REPLACEMENT  2011    right hip    left ankle surgery      TOTAL HIP ARTHROPLASTY Left 12/02/2015   Procedure: LEFT TOTAL HIP ARTHROPLASTY ANTERIOR APPROACH;  Surgeon: [name], MD;  Location: [facility];  Service: Orthopedics;  Laterality: Left;    SOCIAL HISTORY: Social History  Substance Use Topics   Smoking status: Former Smoker    Quit date: 10/01/2002   Smokeless tobacco: Never Used   Alcohol use No    FAMILY HISTORY: Family History  Problem Relation Age of Onset  Diabetes Mother    Hypertension Mother     ROS: Review of Systems  Constitutional: Negative for weight loss.  Cardiovascular: Negative for chest pain.  Gastrointestinal: Negative for nausea and vomiting.  Musculoskeletal:       Negative muscle weakness  Endo/Heme/Allergies:       Negative hypoglycemia    PHYSICAL EXAM: Blood pressure 134/84, pulse 81, temperature 98.3 F (36.8 C), temperature source Oral, height 5\' 7"  (1.702 m), weight 299 lb (135.6 kg), SpO2 97 %. Body mass index is 46.83 kg/m. Physical Exam  Constitutional: She is oriented to person, place, and time. She appears well-developed and well-nourished.  Cardiovascular: Normal rate.   Pulmonary/Chest: Effort normal.    Musculoskeletal: Normal range of motion.  Neurological: She is oriented to person, place, and time.  Skin: Skin is warm and dry.  Psychiatric: She has a normal mood and affect. Her behavior is normal.  Vitals reviewed.   RECENT LABS AND TESTS: BMET    Component Value Date/Time   NA 142 12/31/2016 1032   K 4.1 12/31/2016 1032   CL 101 12/31/2016 1032   CO2 28 12/31/2016 1032   GLUCOSE 110 (H) 12/31/2016 1032   GLUCOSE 239 (H) 12/03/2015 0354   BUN 10 12/31/2016 1032   CREATININE 0.62 12/31/2016 1032   CALCIUM 9.5 12/31/2016 1032   GFRNONAA 100 12/31/2016 1032   GFRAA 115 12/31/2016 1032   Lab Results  Component Value Date   HGBA1C 7.5 02/25/2017   HGBA1C 8.0 (H) 12/31/2016   Lab Results  Component Value Date   INSULIN 21.3 12/31/2016   CBC    Component Value Date/Time   WBC 6.1 12/31/2016 1032   WBC 12.8 (H) 12/03/2015 0354   RBC 5.01 12/31/2016 1032   RBC 4.12 12/03/2015 0354   HGB 13.3 12/31/2016 1032   HCT 40.2 12/31/2016 1032   PLT 223 12/31/2016 1032   MCV 80 12/31/2016 1032   MCH 26.5 (L) 12/31/2016 1032   MCH 27.7 12/03/2015 0354   MCHC 33.1 12/31/2016 1032   MCHC 33.3 12/03/2015 0354   RDW 16.3 (H) 12/31/2016 1032   LYMPHSABS 1.2 12/31/2016 1032   EOSABS 0.1 12/31/2016 1032   BASOSABS 0.0 12/31/2016 1032   Iron/TIBC/Ferritin/ %Sat No results found for: IRON, TIBC, FERRITIN, IRONPCTSAT Lipid Panel     Component Value Date/Time   CHOL 148 12/31/2016 1032   TRIG 61 12/31/2016 1032   HDL 58 12/31/2016 1032   LDLCALC 78 12/31/2016 1032   Hepatic Function Panel     Component Value Date/Time   PROT 6.8 12/31/2016 1032   ALBUMIN 3.8 12/31/2016 1032   AST 10 12/31/2016 1032   ALT 14 12/31/2016 1032   ALKPHOS 76 12/31/2016 1032   BILITOT 0.2 12/31/2016 1032      Component Value Date/Time   TSH 0.508 12/31/2016 1032    ASSESSMENT AND PLAN: Type 2 diabetes mellitus without complication, without long-term current use of insulin (HCC) - Plan:  liraglutide (VICTOZA) 18 MG/3ML SOPN  Vitamin D deficiency - Plan: Vitamin D, Ergocalciferol, (DRISDOL) 50000 units CAPS capsule  At risk for heart disease  Class 3 obesity with serious comorbidity and body mass index (BMI) of 45.0 to 49.9 in adult, unspecified obesity type ([place])  PLAN:  Vitamin D Deficiency Zineb was informed that low vitamin D levels contributes to fatigue and are associated with obesity, breast, and colon cancer. She agrees to continue to take prescription Vit D @50 ,000 IU every week, we will refill for 1  month and will follow up for routine testing of vitamin D, at least 2-3 times per year. She was informed of the risk of over-replacement of vitamin D and agrees to not increase her dose unless he discusses this with [place] first. Elanda agrees to follow up with our clinic in 3 weeks.  Diabetes II Leshonda has been given extensive diabetes education by myself today including ideal fasting and post-prandial blood glucose readings, individual ideal Hgb A1c goals  and hypoglycemia prevention. We discussed the importance of good blood sugar control to decrease the likelihood of diabetic complications such as nephropathy, neuropathy, limb loss, blindness, coronary artery disease, and death. We discussed the importance of intensive lifestyle modification including diet, exercise and weight loss as the first line treatment for diabetes. Takoya agrees to continue victoza 1.2 mg, we will refill for 1 month and she will follow up at the agreed upon time.  Cardiovascular risk counselling Augustine was given extended (15 minutes) coronary artery disease prevention counseling today. She is 61 y.o. female and has risk factors for heart disease including obesity and diabetes. We discussed intensive lifestyle modifications today with an emphasis on specific weight loss instructions and strategies. Pt was also informed of the importance of increasing exercise and decreasing saturated fats to help prevent  heart disease.  Obesity Meredith is currently in the action stage of change. As such, her goal is to continue with weight loss efforts She has agreed to keep a food journal with 1200 to 1400 calories and 75 grams of protein  Nahomy has been instructed to work up to a goal of 150 minutes of combined cardio and strengthening exercise per week for weight loss and overall health benefits. We discussed the following Behavioral Modification Strategies today: increase H2O intake, meal planning & cooking strategies, increasing lean protein intake and decreasing sodium intake  Camille has agreed to follow up with our clinic in 3 weeks and will follow up with our dietician in 1 1/2 weeks. She was informed of the importance of frequent follow up visits to maximize her success with intensive lifestyle modifications for her multiple health conditions.  I, Doreene Nest, am acting as transcriptionist for Dennard Nip, MD  I have reviewed the above documentation for accuracy and completeness, and I agree with the above. -Dennard Nip, MD   OBESITY BEHAVIORAL INTERVENTION VISIT  Today's visit was # 7 out of 22.  Starting weight: 303 lbs Starting date: 12/31/16 Today's weight : 299 lbs Today's date: 04/12/2017 Total lbs lost to date: 4 (Patients must lose 7 lbs in the first 6 months to continue with counseling)   ASK: We discussed the diagnosis of obesity with Rockne Menghini today and Ryland agreed to give [place] permission to discuss obesity behavioral modification therapy today.  ASSESS: Connelly has the diagnosis of obesity and her BMI today is 46.9 Kabrea is in the action stage of change   ADVISE: Maddison was educated on the multiple health risks of obesity as well as the benefit of weight loss to improve her health. She was advised of the need for long term treatment and the importance of lifestyle modifications.  AGREE: Multiple dietary modification options and treatment options were discussed  and  Kiari agreed to keep a food journal with 1200 to 1400 calories and 75 protein  We discussed the following Behavioral Modification Strategies today: increase H2O intake, meal planning & cooking strategies, increasing lean protein intake and decreasing sodium intake

## 2017-04-27 ENCOUNTER — Ambulatory Visit (INDEPENDENT_AMBULATORY_CARE_PROVIDER_SITE_OTHER): Payer: 59 | Admitting: Dietician

## 2017-04-27 VITALS — Ht 67.0 in | Wt 295.0 lb

## 2017-04-27 DIAGNOSIS — E119 Type 2 diabetes mellitus without complications: Secondary | ICD-10-CM | POA: Diagnosis not present

## 2017-04-27 DIAGNOSIS — Z6841 Body Mass Index (BMI) 40.0 and over, adult: Secondary | ICD-10-CM

## 2017-04-27 DIAGNOSIS — IMO0001 Reserved for inherently not codable concepts without codable children: Secondary | ICD-10-CM

## 2017-04-27 DIAGNOSIS — Z9189 Other specified personal risk factors, not elsewhere classified: Secondary | ICD-10-CM

## 2017-04-27 DIAGNOSIS — E669 Obesity, unspecified: Secondary | ICD-10-CM

## 2017-04-27 NOTE — Progress Notes (Signed)
Office: [phone]  /  Fax: [phone]  This is visit #8 out of 22.  HPI:   Chief Complaint: OBESITY Laura Blackwell is here to discuss her progress with her obesity treatment plan. She is on the  keep a food journal with 1200 to 1400 calories and 75+ protein  and is following her eating plan approximately 30 % of the time. She states she is walking 15 minutes 5 times per week.  Laura Blackwell is currently struggling with meeting her calorie and protein goals with journaling. Her caloric intake consistently below 1100 calories.  She is eating out almost daily, fast food and restaurants and is skipping meals, usually breakfast.  Her weight is 295 lb (133.8 kg) today and has lost 4 lbs since her last visit. She has lost 8 lbs since starting treatment with [place].  Diabetes II Laura Blackwell has a diagnosis of diabetes type II. Laura Blackwell states her blood sugars are running in the 130's. She did not bring in her blood glucose log.   denies any hypoglycemic episodes. Last A1c was Hemoglobin A1C 02/25/2017 12/31/2016  HGBA1C 7.5 8.0(H)  Some recent data might be hidden    She has been working on intensive lifestyle modifications including diet, exercise, and weight loss to help control her blood glucose levels.  At risk for cardiovascular disease Laura Blackwell is at a higher than average risk for cardiovascular disease due to obesity and diabetes.She currently denies any chest pain.  ALLERGIES: No Known Allergies  MEDICATIONS: Current Outpatient Prescriptions on File Prior to Visit  Medication Sig Dispense Refill  . amLODipine (NORVASC) 5 MG tablet Take 5 mg by mouth daily.     Marland Kitchen atorvastatin (LIPITOR) 20 MG tablet Take 20 mg by mouth daily.     . furosemide (LASIX) 20 MG tablet Take 20 mg by mouth 2 (two) times daily.     Marland Kitchen glucose blood (ONETOUCH VERIO) test strip 1 each by Other route 2 (two) times daily. Use as instructed    . liraglutide (VICTOZA) 18 MG/3ML SOPN Inject 0.2 mLs (1.2 mg total) into the skin every  morning. 2 pen 0  . losartan (COZAAR) 100 MG tablet Take 100 mg by mouth every morning.     . metFORMIN (GLUCOPHAGE) 500 MG tablet Take 1,000 mg by mouth 2 (two) times daily with a meal.     . Vitamin D, Ergocalciferol, (DRISDOL) 50000 units CAPS capsule Take 1 capsule (50,000 Units total) by mouth every 7 (seven) days. 4 capsule 0   No current facility-administered medications on file prior to visit.     PAST MEDICAL HISTORY: Past Medical History:  Diagnosis Date  . Arthritis   . Diabetes mellitus without complication ([place])   . Edema    in left leg in 1980s on left ankle   . Hypertension   . PCOS (polycystic ovarian syndrome)   . Swelling   . Vitamin D deficiency     PAST SURGICAL HISTORY: Past Surgical History:  Procedure Laterality Date  . JOINT REPLACEMENT  2011    right hip   . left ankle surgery     . TOTAL HIP ARTHROPLASTY Left 12/02/2015   Procedure: LEFT TOTAL HIP ARTHROPLASTY ANTERIOR APPROACH;  Surgeon: [name], MD;  Location: [facility];  Service: Orthopedics;  Laterality: Left;    SOCIAL HISTORY: Social History  Substance Use Topics  . Smoking status: Former Smoker    Quit date: 10/01/2002  . Smokeless tobacco: Never Used  . Alcohol use No    FAMILY  HISTORY: Family History  Problem Relation Age of Onset  . Diabetes Mother   . Hypertension Mother     ROS: ROS  PHYSICAL EXAM: Height 5\' 7"  (1.702 m), weight 295 lb (133.8 kg). Body mass index is 46.2 kg/m. Physical Exam  RECENT LABS AND TESTS: BMET    Component Value Date/Time   NA 142 12/31/2016 1032   K 4.1 12/31/2016 1032   CL 101 12/31/2016 1032   CO2 28 12/31/2016 1032   GLUCOSE 110 (H) 12/31/2016 1032   GLUCOSE 239 (H) 12/03/2015 0354   BUN 10 12/31/2016 1032   CREATININE 0.62 12/31/2016 1032   CALCIUM 9.5 12/31/2016 1032   GFRNONAA 100 12/31/2016 1032   GFRAA 115 12/31/2016 1032   Lab Results  Component Value Date   HGBA1C 7.5 02/25/2017   HGBA1C 8.0 (H) 12/31/2016   Lab  Results  Component Value Date   INSULIN 21.3 12/31/2016   CBC    Component Value Date/Time   WBC 6.1 12/31/2016 1032   WBC 12.8 (H) 12/03/2015 0354   RBC 5.01 12/31/2016 1032   RBC 4.12 12/03/2015 0354   HGB 13.3 12/31/2016 1032   HCT 40.2 12/31/2016 1032   PLT 223 12/31/2016 1032   MCV 80 12/31/2016 1032   MCH 26.5 (L) 12/31/2016 1032   MCH 27.7 12/03/2015 0354   MCHC 33.1 12/31/2016 1032   MCHC 33.3 12/03/2015 0354   RDW 16.3 (H) 12/31/2016 1032   LYMPHSABS 1.2 12/31/2016 1032   EOSABS 0.1 12/31/2016 1032   BASOSABS 0.0 12/31/2016 1032   Iron/TIBC/Ferritin/ %Sat No results found for: IRON, TIBC, FERRITIN, IRONPCTSAT Lipid Panel     Component Value Date/Time   CHOL 148 12/31/2016 1032   TRIG 61 12/31/2016 1032   HDL 58 12/31/2016 1032   LDLCALC 78 12/31/2016 1032   Hepatic Function Panel     Component Value Date/Time   PROT 6.8 12/31/2016 1032   ALBUMIN 3.8 12/31/2016 1032   AST 10 12/31/2016 1032   ALT 14 12/31/2016 1032   ALKPHOS 76 12/31/2016 1032   BILITOT 0.2 12/31/2016 1032      Component Value Date/Time   TSH 0.508 12/31/2016 1032     ASSESSMENT AND PLAN: Type 2 diabetes mellitus without complication, without long-term current use of insulin (HCC)  At risk for heart disease  Class 3 obesity with serious comorbidity and body mass index (BMI) of 45.0 to 49.9 in adult, unspecified obesity type ([place])  PLAN:  Laura Blackwell is currently in the action stage of change.  As such, her goal is to continue with weight loss efforts She has agreed to stop journaling for 1 week and get back the category 2 meal  plan. Discussed importance of adequate calorie and protein intake to maintain metabolism and blood glucose control.  Laura Blackwell has been instructed to work up to a goal of 150 minutes of combined cardio and strengthening exercise per week for weight loss and overall health benefits. We discussed the following Behavioral Modification Stratagies today: increasing  lean protein intake, decreasing simple carbohydrates , increasing vegetables, decrease eating out and decrease junk food    Cardiovascular risk counselling Laura Blackwell was given extended (30 minute) coronary artery disease prevention counseling today. She is 61 y.o. female and has risk factors for heart disease including obesity and diabetes. We discussed intensive lifestyle modifications today with an emphasis on specific weight loss instructions and strategies. Pt was also informed of the importance of increasing exercise and decreasing saturated fats to help prevent  heart disease.  Laura Blackwell has agreed to follow up with our clinic in approximately 2 weeks. She was informed of the importance of frequent follow up visits to maximize her success with intensive lifestyle modifications for her multiple health conditions.  I have reviewed the above documentation for accuracy and completeness, and I agree with the above. -Dennard Nip, MD

## 2017-04-28 DIAGNOSIS — M65331 Trigger finger, right middle finger: Secondary | ICD-10-CM | POA: Diagnosis not present

## 2017-04-28 DIAGNOSIS — M79641 Pain in right hand: Secondary | ICD-10-CM | POA: Diagnosis not present

## 2017-04-29 ENCOUNTER — Encounter (INDEPENDENT_AMBULATORY_CARE_PROVIDER_SITE_OTHER): Payer: Self-pay | Admitting: Family Medicine

## 2017-05-04 ENCOUNTER — Ambulatory Visit (INDEPENDENT_AMBULATORY_CARE_PROVIDER_SITE_OTHER): Payer: 59 | Admitting: Family Medicine

## 2017-05-04 VITALS — BP 128/81 | HR 85 | Temp 98.1°F | Ht 67.0 in | Wt 290.0 lb

## 2017-05-04 DIAGNOSIS — Z6841 Body Mass Index (BMI) 40.0 and over, adult: Secondary | ICD-10-CM | POA: Diagnosis not present

## 2017-05-04 DIAGNOSIS — Z9189 Other specified personal risk factors, not elsewhere classified: Secondary | ICD-10-CM | POA: Diagnosis not present

## 2017-05-04 DIAGNOSIS — E559 Vitamin D deficiency, unspecified: Secondary | ICD-10-CM | POA: Diagnosis not present

## 2017-05-04 DIAGNOSIS — E669 Obesity, unspecified: Secondary | ICD-10-CM | POA: Diagnosis not present

## 2017-05-04 DIAGNOSIS — IMO0001 Reserved for inherently not codable concepts without codable children: Secondary | ICD-10-CM

## 2017-05-04 DIAGNOSIS — E119 Type 2 diabetes mellitus without complications: Secondary | ICD-10-CM | POA: Diagnosis not present
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
## 2017-05-05 LAB — COMPREHENSIVE METABOLIC PANEL
ALT: 13 IU/L (ref 0–32)
AST: 9 IU/L (ref 0–40)
Albumin/Globulin Ratio: 1.5 (ref 1.2–2.2)
Albumin: 4.1 g/dL (ref 3.5–5.5)
Alkaline Phosphatase: 80 IU/L (ref 39–117)
BUN/Creatinine Ratio: 18 (ref 9–23)
BUN: 11 mg/dL (ref 6–24)
Bilirubin Total: 0.2 mg/dL (ref 0.0–1.2)
CALCIUM: 10.3 mg/dL — AB (ref 8.7–10.2)
CO2: 22 mmol/L (ref 20–29)
CREATININE: 0.6 mg/dL (ref 0.57–1.00)
Chloride: 100 mmol/L (ref 96–106)
GFR calc Af Amer: 115 mL/min/{1.73_m2} (ref >59)
GFR, EST NON AFRICAN AMERICAN: 100 mL/min/{1.73_m2} (ref >59)
GLOBULIN, TOTAL: 2.8 g/dL (ref 1.5–4.5)
Glucose: 120 mg/dL — ABNORMAL HIGH (ref 65–99)
Potassium: 4.3 mmol/L (ref 3.5–5.2)
Sodium: 141 mmol/L (ref 134–144)
Total Protein: 6.9 g/dL (ref 6.0–8.5)

## 2017-05-05 LAB — VITAMIN D 25 HYDROXY (VIT D DEFICIENCY, FRACTURES): VIT D 25 HYDROXY: 27.5 ng/mL — AB (ref 30.0–100.0)

## 2017-05-05 LAB — INSULIN, RANDOM: INSULIN: 26.2 u[IU]/mL — AB (ref 2.6–24.9)

## 2017-05-05 LAB — HEMOGLOBIN A1C
Est. average glucose Bld gHb Est-mCnc: 163 mg/dL
HEMOGLOBIN A1C: 7.3 % — AB (ref 4.8–5.6)

## 2017-05-19 ENCOUNTER — Ambulatory Visit (INDEPENDENT_AMBULATORY_CARE_PROVIDER_SITE_OTHER): Payer: 59 | Admitting: Family Medicine

## 2017-05-19 VITALS — BP 112/75 | HR 84 | Temp 98.1°F | Ht 67.0 in | Wt 292.0 lb

## 2017-05-19 DIAGNOSIS — E119 Type 2 diabetes mellitus without complications: Secondary | ICD-10-CM

## 2017-05-19 DIAGNOSIS — IMO0001 Reserved for inherently not codable concepts without codable children: Secondary | ICD-10-CM

## 2017-05-19 DIAGNOSIS — Z6841 Body Mass Index (BMI) 40.0 and over, adult: Secondary | ICD-10-CM | POA: Diagnosis not present

## 2017-05-19 DIAGNOSIS — Z9189 Other specified personal risk factors, not elsewhere classified: Secondary | ICD-10-CM | POA: Diagnosis not present

## 2017-05-19 DIAGNOSIS — I10 Essential (primary) hypertension: Secondary | ICD-10-CM

## 2017-05-19 DIAGNOSIS — E669 Obesity, unspecified: Secondary | ICD-10-CM

## 2017-05-19 MED ORDER — LIRAGLUTIDE 18 MG/3ML ~~LOC~~ SOPN: 1.2000 mg | PEN_INJECTOR | Freq: Every morning | SUBCUTANEOUS | 0 refills | Status: DC
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
## 2017-05-26 ENCOUNTER — Telehealth (INDEPENDENT_AMBULATORY_CARE_PROVIDER_SITE_OTHER): Payer: Self-pay | Admitting: Family Medicine

## 2017-05-26 NOTE — Telephone Encounter (Signed)
Vitamin D, Ergocalciferol, (DRISDOL) 50000 units CAPS capsule  [facility] is [facility] to know if [facility] received the refill request faxed on 05/23/17  Fax [phone]   Phone [phone]

## 2017-05-26 NOTE — Telephone Encounter (Signed)
Spoke with the pt who states she does not need a refill on her Vitamin D at this time. She will get it refilled on her next visit. She states she asked [facility] to remove her from the auto refill. April, [place]

## 2017-06-01 ENCOUNTER — Ambulatory Visit (INDEPENDENT_AMBULATORY_CARE_PROVIDER_SITE_OTHER): Payer: 59 | Admitting: Physician Assistant

## 2017-06-02 ENCOUNTER — Ambulatory Visit (INDEPENDENT_AMBULATORY_CARE_PROVIDER_SITE_OTHER): Payer: 59 | Admitting: Physician Assistant

## 2017-06-02 VITALS — BP 117/80 | HR 90 | Temp 98.3°F | Ht 67.0 in | Wt 293.0 lb

## 2017-06-02 DIAGNOSIS — Z9189 Other specified personal risk factors, not elsewhere classified: Secondary | ICD-10-CM | POA: Diagnosis not present

## 2017-06-02 DIAGNOSIS — E119 Type 2 diabetes mellitus without complications: Secondary | ICD-10-CM

## 2017-06-02 DIAGNOSIS — E559 Vitamin D deficiency, unspecified: Secondary | ICD-10-CM | POA: Diagnosis not present

## 2017-06-02 DIAGNOSIS — Z6841 Body Mass Index (BMI) 40.0 and over, adult: Secondary | ICD-10-CM | POA: Diagnosis not present

## 2017-06-02 MED ORDER — VITAMIN D (ERGOCALCIFEROL) 1.25 MG (50000 UNIT) PO CAPS: 50000.0000 [IU] | ORAL_CAPSULE | ORAL | 0 refills | Status: DC

## 2017-06-03 NOTE — Progress Notes (Signed)
Office: [phone]  /  Fax: [phone]   HPI:   Chief Complaint: OBESITY Laura Blackwell is here to discuss her progress with her obesity treatment plan. She is on the Category 2 plan and is following her eating plan approximately 60 % of the time. She states she is walking for 15 minutes 5 times per week. [name] is making smarter food choices and she controls her portions but is not meeting her protein requirement. Her weight is 293 lb (132.9 kg) today and has had a weight gain of 1 lb over a period of 2 weeks since her last visit. She has lost 10 lbs since starting treatment with [place].  Vitamin D deficiency [name] has a diagnosis of vitamin D deficiency. She is currently taking vit D and denies nausea, vomiting or muscle weakness.  At risk for osteopenia and osteoporosis [name] is at higher risk of osteopenia and osteoporosis due to vitamin D deficiency.   Diabetes II Addalie has a diagnosis of diabetes type II. [name] states fasting BGs range in the 110's and post prandial range in the 140's. [name] denies any hypoglycemic episodes. She has been working on intensive lifestyle modifications including diet, exercise, and weight loss to help control her blood glucose levels.   ALLERGIES: No Known Allergies  MEDICATIONS: Current Outpatient Prescriptions on File Prior to Visit  Medication Sig Dispense Refill  . amLODipine (NORVASC) 5 MG tablet Take 5 mg by mouth daily.     Marland Kitchen atorvastatin (LIPITOR) 20 MG tablet Take 20 mg by mouth daily.     . furosemide (LASIX) 20 MG tablet Take 20 mg by mouth 2 (two) times daily.     Marland Kitchen glucose blood (ONETOUCH VERIO) test strip 1 each by Other route 2 (two) times daily. Use as instructed    . liraglutide (VICTOZA) 18 MG/3ML SOPN Inject 0.2 mLs (1.2 mg total) into the skin every morning. 2 pen 0  . losartan (COZAAR) 100 MG tablet Take 100 mg by mouth every morning.     . metFORMIN (GLUCOPHAGE) 500 MG tablet Take 1,000 mg by mouth 2 (two) times daily with a  meal.      No current facility-administered medications on file prior to visit.     PAST MEDICAL HISTORY: Past Medical History:  Diagnosis Date  . Arthritis   . Diabetes mellitus without complication ([place])   . Edema    in left leg in 1980s on left ankle   . Hypertension   . PCOS (polycystic ovarian syndrome)   . Swelling   . Vitamin D deficiency     PAST SURGICAL HISTORY: Past Surgical History:  Procedure Laterality Date  . JOINT REPLACEMENT  2011    right hip   . left ankle surgery     . TOTAL HIP ARTHROPLASTY Left 12/02/2015   Procedure: LEFT TOTAL HIP ARTHROPLASTY ANTERIOR APPROACH;  Surgeon: [name], MD;  Location: [facility];  Service: Orthopedics;  Laterality: Left;    SOCIAL HISTORY: Social History  Substance Use Topics  . Smoking status: Former Smoker    Quit date: 10/01/2002  . Smokeless tobacco: Never Used  . Alcohol use No    FAMILY HISTORY: Family History  Problem Relation Age of Onset  . Diabetes Mother   . Hypertension Mother     ROS: Review of Systems  Constitutional: Negative for weight loss.  Gastrointestinal: Negative for nausea and vomiting.  Musculoskeletal:       Negative muscle weakness  Endo/Heme/Allergies:  Negative hypoglycemia    PHYSICAL EXAM: Blood pressure 117/80, pulse 90, temperature 98.3 F (36.8 C), temperature source Oral, height 5\' 7"  (1.702 m), weight 293 lb (132.9 kg), SpO2 97 %. Body mass index is 45.89 kg/m. Physical Exam  Constitutional: She is oriented to person, place, and time. She appears well-developed and well-nourished.  Cardiovascular: Normal rate.   Pulmonary/Chest: Effort normal.  Musculoskeletal: Normal range of motion.  Neurological: She is oriented to person, place, and time.  Skin: Skin is warm and dry.  Psychiatric: She has a normal mood and affect. Her behavior is normal.  Vitals reviewed.   RECENT LABS AND TESTS: BMET    Component Value Date/Time   NA 141 05/04/2017 0818   K 4.3  05/04/2017 0818   CL 100 05/04/2017 0818   CO2 22 05/04/2017 0818   GLUCOSE 120 (H) 05/04/2017 0818   GLUCOSE 239 (H) 12/03/2015 0354   BUN 11 05/04/2017 0818   CREATININE 0.60 05/04/2017 0818   CALCIUM 10.3 (H) 05/04/2017 0818   GFRNONAA 100 05/04/2017 0818   GFRAA 115 05/04/2017 0818   Lab Results  Component Value Date   HGBA1C 7.3 (H) 05/04/2017   HGBA1C 7.5 02/25/2017   HGBA1C 8.0 (H) 12/31/2016   Lab Results  Component Value Date   INSULIN 26.2 (H) 05/04/2017   INSULIN 21.3 12/31/2016   CBC    Component Value Date/Time   WBC 6.1 12/31/2016 1032   WBC 12.8 (H) 12/03/2015 0354   RBC 5.01 12/31/2016 1032   RBC 4.12 12/03/2015 0354   HGB 13.3 12/31/2016 1032   HCT 40.2 12/31/2016 1032   PLT 223 12/31/2016 1032   MCV 80 12/31/2016 1032   MCH 26.5 (L) 12/31/2016 1032   MCH 27.7 12/03/2015 0354   MCHC 33.1 12/31/2016 1032   MCHC 33.3 12/03/2015 0354   RDW 16.3 (H) 12/31/2016 1032   LYMPHSABS 1.2 12/31/2016 1032   EOSABS 0.1 12/31/2016 1032   BASOSABS 0.0 12/31/2016 1032   Iron/TIBC/Ferritin/ %Sat No results found for: IRON, TIBC, FERRITIN, IRONPCTSAT Lipid Panel     Component Value Date/Time   CHOL 148 12/31/2016 1032   TRIG 61 12/31/2016 1032   HDL 58 12/31/2016 1032   LDLCALC 78 12/31/2016 1032   Hepatic Function Panel     Component Value Date/Time   PROT 6.9 05/04/2017 0818   ALBUMIN 4.1 05/04/2017 0818   AST 9 05/04/2017 0818   ALT 13 05/04/2017 0818   ALKPHOS 80 05/04/2017 0818   BILITOT 0.2 05/04/2017 0818      Component Value Date/Time   TSH 0.508 12/31/2016 1032    ASSESSMENT AND PLAN: Vitamin D deficiency - Plan: Vitamin D, Ergocalciferol, (DRISDOL) 50000 units CAPS capsule  Type 2 diabetes mellitus without complication, without long-term current use of insulin (HCC)  At risk for osteoporosis  Class 3 severe obesity with serious comorbidity and body mass index (BMI) of 45.0 to 49.9 in adult, unspecified obesity type  ([place])  PLAN:  Vitamin D Deficiency [name] was informed that low vitamin D levels contributes to fatigue and are associated with obesity, breast, and colon cancer. She agrees to continue to take prescription Vit D @50 ,000 IU every week, we will refill for 1 month and will follow up for routine testing of vitamin D, at least 2-3 times per year. She was informed of the risk of over-replacement of vitamin D and agrees to not increase her dose unless he discusses this with [place] first.  At risk for osteopenia and  osteoporosis Ahnya is at risk for osteopenia and osteoporosis due to her vitamin D deficiency. She was encouraged to take her vitamin D and follow her higher calcium diet and increase strengthening exercise to help strengthen her bones and decrease her risk of osteopenia and osteoporosis.  Diabetes II Izetta has been given extensive diabetes education by myself today including ideal fasting and post-prandial blood glucose readings, individual ideal Hgb A1c goals  and hypoglycemia prevention. We discussed the importance of good blood sugar control to decrease the likelihood of diabetic complications such as nephropathy, neuropathy, limb loss, blindness, coronary artery disease, and death. We discussed the importance of intensive lifestyle modification including diet, exercise and weight loss as the first line treatment for diabetes. Jakai agrees to continue Victoza and will follow up at the agreed upon time.  Obesity Genesys is currently in the action stage of change. As such, her goal is to continue with weight loss efforts She has agreed to follow the Category 2 plan Joann has been instructed to work up to a goal of 150 minutes of combined cardio and strengthening exercise per week for weight loss and overall health benefits. We discussed the following Behavioral Modification Strategies today: increasing lean protein intake and work on meal planning and easy cooking plans  Mkayla has agreed  to follow up with our clinic in 3 weeks. She was informed of the importance of frequent follow up visits to maximize her success with intensive lifestyle modifications for her multiple health conditions.  I, Doreene Nest, am acting as transcriptionist for Lacy Duverney, PA-C  I have reviewed the above documentation for accuracy and completeness, and I agree with the above. -Lacy Duverney, PA-C  I have reviewed the above note and agree with the plan. -Dennard Nip, MD   OBESITY BEHAVIORAL INTERVENTION VISIT  Today's visit was # 10 out of 22.  Starting weight: 303 lbs Starting date: 12/31/16 Today's weight : 293 lbs  Today's date: 06/02/2017 Total lbs lost to date: 10 (Patients must lose 7 lbs in the first 6 months to continue with counseling)   ASK: We discussed the diagnosis of obesity with Rockne Menghini today and Samayah agreed to give [place] permission to discuss obesity behavioral modification therapy today.  ASSESS: Angalena has the diagnosis of obesity and her BMI today is 45.88 Chrishawn is in the action stage of change   ADVISE: Toshika was educated on the multiple health risks of obesity as well as the benefit of weight loss to improve her health. She was advised of the need for long term treatment and the importance of lifestyle modifications.  AGREE: Multiple dietary modification options and treatment options were discussed and  Parrie agreed to follow the Category 2 plan We discussed the following Behavioral Modification Strategies today: increasing lean protein intake and work on meal planning and easy cooking plans

## 2017-06-23 ENCOUNTER — Ambulatory Visit (INDEPENDENT_AMBULATORY_CARE_PROVIDER_SITE_OTHER): Payer: 59 | Admitting: Physician Assistant

## 2017-07-01 ENCOUNTER — Other Ambulatory Visit: Payer: Self-pay | Admitting: Family Medicine

## 2017-07-01 DIAGNOSIS — Z1231 Encounter for screening mammogram for malignant neoplasm of breast: Secondary | ICD-10-CM

## 2017-07-27 ENCOUNTER — Ambulatory Visit
Admission: RE | Admit: 2017-07-27 | Discharge: 2017-07-27 | Disposition: A | Discharge status: Home / Self Care | Payer: 59 | Source: Ambulatory Visit | Attending: Family Medicine | Admitting: Family Medicine

## 2017-07-27 DIAGNOSIS — Z1231 Encounter for screening mammogram for malignant neoplasm of breast: Secondary | ICD-10-CM

## 2017-07-28 ENCOUNTER — Other Ambulatory Visit (INDEPENDENT_AMBULATORY_CARE_PROVIDER_SITE_OTHER): Payer: Self-pay

## 2017-07-28 ENCOUNTER — Telehealth (INDEPENDENT_AMBULATORY_CARE_PROVIDER_SITE_OTHER): Payer: Self-pay | Admitting: Family Medicine

## 2017-07-28 DIAGNOSIS — E559 Vitamin D deficiency, unspecified: Secondary | ICD-10-CM

## 2017-07-28 DIAGNOSIS — E119 Type 2 diabetes mellitus without complications: Secondary | ICD-10-CM

## 2017-07-28 MED ORDER — LIRAGLUTIDE 18 MG/3ML ~~LOC~~ SOPN: 1.2000 mg | PEN_INJECTOR | Freq: Every morning | SUBCUTANEOUS | 0 refills | Status: DC

## 2017-07-28 MED ORDER — VITAMIN D (ERGOCALCIFEROL) 1.25 MG (50000 UNIT) PO CAPS: 50000.0000 [IU] | ORAL_CAPSULE | ORAL | 0 refills | Status: DC

## 2017-07-28 NOTE — Telephone Encounter (Signed)
Sent prescriptions in to her pharmacy. April, [place]

## 2017-07-28 NOTE — Telephone Encounter (Signed)
Has appointment 12/10 but needs refill victoza and vitamin d

## 2017-08-04 DIAGNOSIS — E1165 Type 2 diabetes mellitus with hyperglycemia: Secondary | ICD-10-CM | POA: Diagnosis not present

## 2017-08-04 DIAGNOSIS — R600 Localized edema: Secondary | ICD-10-CM | POA: Diagnosis not present

## 2017-08-04 DIAGNOSIS — I10 Essential (primary) hypertension: Secondary | ICD-10-CM | POA: Diagnosis not present

## 2017-08-09 ENCOUNTER — Ambulatory Visit (INDEPENDENT_AMBULATORY_CARE_PROVIDER_SITE_OTHER): Payer: 59 | Admitting: Physician Assistant

## 2017-08-12 ENCOUNTER — Ambulatory Visit (INDEPENDENT_AMBULATORY_CARE_PROVIDER_SITE_OTHER): Payer: 59 | Admitting: Physician Assistant

## 2017-08-12 VITALS — BP 109/68 | HR 74 | Temp 98.0°F | Ht 67.0 in | Wt 294.0 lb

## 2017-08-12 DIAGNOSIS — Z6841 Body Mass Index (BMI) 40.0 and over, adult: Secondary | ICD-10-CM | POA: Diagnosis not present

## 2017-08-12 DIAGNOSIS — E559 Vitamin D deficiency, unspecified: Secondary | ICD-10-CM | POA: Diagnosis not present

## 2017-08-12 NOTE — Progress Notes (Signed)
Office: [phone]  /  Fax: [phone]   HPI:   Chief Complaint: OBESITY Laura Blackwell is here to discuss her progress with her obesity treatment plan. She is on the Category 2 plan and is following her eating plan approximately 40 % of the time. She states she is exercising 30 minutes 3 times per week. Laura Blackwell has not followed up with our clinic in the past 10 weeks. She has been controlling her portions and is more mindful of her eating. She continues to have challenges planning ahead her meals.  Her weight is 294 lb (133.4 kg) today and has gained 1 pound since her last visit. She has lost 9 lbs since starting treatment with [place].  Vitamin D deficiency Laura Blackwell has a diagnosis of vitamin D deficiency. Vit D is below 30 and not yet at goal. She is currently taking prescription Vit D and denies nausea, vomiting or muscle weakness.  ALLERGIES: No Known Allergies  MEDICATIONS: Current Outpatient Medications on File Prior to Visit  Medication Sig Dispense Refill  . amLODipine (NORVASC) 5 MG tablet Take 5 mg by mouth daily.     Marland Kitchen atorvastatin (LIPITOR) 20 MG tablet Take 20 mg by mouth daily.     . furosemide (LASIX) 20 MG tablet Take 20 mg by mouth 2 (two) times daily.     Marland Kitchen glucose blood (ONETOUCH VERIO) test strip 1 each by Other route 2 (two) times daily. Use as instructed    . liraglutide (VICTOZA) 18 MG/3ML SOPN Inject 0.2 mLs (1.2 mg total) into the skin every morning. 2 pen 0  . losartan (COZAAR) 100 MG tablet Take 100 mg by mouth every morning.     . metFORMIN (GLUCOPHAGE) 500 MG tablet Take 1,000 mg by mouth 2 (two) times daily with a meal.     . Vitamin D, Ergocalciferol, (DRISDOL) 50000 units CAPS capsule Take 1 capsule (50,000 Units total) by mouth every 7 (seven) days. 4 capsule 0   No current facility-administered medications on file prior to visit.     PAST MEDICAL HISTORY: Past Medical History:  Diagnosis Date  . Arthritis   . Diabetes mellitus without complication ([place])     . Edema    in left leg in 1980s on left ankle   . Hypertension   . PCOS (polycystic ovarian syndrome)   . Swelling   . Vitamin D deficiency     PAST SURGICAL HISTORY: Past Surgical History:  Procedure Laterality Date  . BREAST BIOPSY Right   . JOINT REPLACEMENT  2011    right hip   . left ankle surgery     . TOTAL HIP ARTHROPLASTY Left 12/02/2015   Procedure: LEFT TOTAL HIP ARTHROPLASTY ANTERIOR APPROACH;  Surgeon: [name], MD;  Location: [facility];  Service: Orthopedics;  Laterality: Left;    SOCIAL HISTORY: Social History   Tobacco Use  . Smoking status: Former Smoker    Last attempt to quit: 10/01/2002    Years since quitting: 14.8  . Smokeless tobacco: Never Used  Substance Use Topics  . Alcohol use: No  . Drug use: No    FAMILY HISTORY: Family History  Problem Relation Age of Onset  . Diabetes Mother   . Hypertension Mother     ROS: Review of Systems  Constitutional: Negative for weight loss.  Gastrointestinal: Negative for nausea and vomiting.  Musculoskeletal:       Negative muscle weakness    PHYSICAL EXAM: Blood pressure 109/68, pulse 74, temperature 98 F (36.7 C),  temperature source Oral, height 5\' 7"  (1.702 m), weight 294 lb (133.4 kg), SpO2 100 %. Body mass index is 46.05 kg/m. Physical Exam  Constitutional: She is oriented to person, place, and time. She appears well-developed and well-nourished.  Cardiovascular: Normal rate.  Pulmonary/Chest: Effort normal.  Musculoskeletal: Normal range of motion.  Neurological: She is oriented to person, place, and time.  Skin: Skin is warm and dry.  Psychiatric: She has a normal mood and affect. Her behavior is normal.  Vitals reviewed.   RECENT LABS AND TESTS: BMET    Component Value Date/Time   NA 141 05/04/2017 0818   K 4.3 05/04/2017 0818   CL 100 05/04/2017 0818   CO2 22 05/04/2017 0818   GLUCOSE 120 (H) 05/04/2017 0818   GLUCOSE 239 (H) 12/03/2015 0354   BUN 11 05/04/2017 0818    CREATININE 0.60 05/04/2017 0818   CALCIUM 10.3 (H) 05/04/2017 0818   GFRNONAA 100 05/04/2017 0818   GFRAA 115 05/04/2017 0818   Lab Results  Component Value Date   HGBA1C 7.3 (H) 05/04/2017   HGBA1C 7.5 02/25/2017   HGBA1C 8.0 (H) 12/31/2016   Lab Results  Component Value Date   INSULIN 26.2 (H) 05/04/2017   INSULIN 21.3 12/31/2016   CBC    Component Value Date/Time   WBC 6.1 12/31/2016 1032   WBC 12.8 (H) 12/03/2015 0354   RBC 5.01 12/31/2016 1032   RBC 4.12 12/03/2015 0354   HGB 13.3 12/31/2016 1032   HCT 40.2 12/31/2016 1032   PLT 223 12/31/2016 1032   MCV 80 12/31/2016 1032   MCH 26.5 (L) 12/31/2016 1032   MCH 27.7 12/03/2015 0354   MCHC 33.1 12/31/2016 1032   MCHC 33.3 12/03/2015 0354   RDW 16.3 (H) 12/31/2016 1032   LYMPHSABS 1.2 12/31/2016 1032   EOSABS 0.1 12/31/2016 1032   BASOSABS 0.0 12/31/2016 1032   Iron/TIBC/Ferritin/ %Sat No results found for: IRON, TIBC, FERRITIN, IRONPCTSAT Lipid Panel     Component Value Date/Time   CHOL 148 12/31/2016 1032   TRIG 61 12/31/2016 1032   HDL 58 12/31/2016 1032   LDLCALC 78 12/31/2016 1032   Hepatic Function Panel     Component Value Date/Time   PROT 6.9 05/04/2017 0818   ALBUMIN 4.1 05/04/2017 0818   AST 9 05/04/2017 0818   ALT 13 05/04/2017 0818   ALKPHOS 80 05/04/2017 0818   BILITOT 0.2 05/04/2017 0818      Component Value Date/Time   TSH 0.508 12/31/2016 1032    ASSESSMENT AND PLAN: Vitamin D deficiency  Class 3 severe obesity without serious comorbidity with body mass index (BMI) of 45.0 to 49.9 in adult, unspecified obesity type (HCC)  PLAN:  Vitamin D Deficiency Laura Blackwell was informed that low vitamin D levels contributes to fatigue and are associated with obesity, breast, and colon cancer. Laura Blackwell agrees to continue taking prescription Vit D @50 ,000 IU every week #4 and will follow up for routine testing of vitamin D, at least 2-3 times per year. She was informed of the risk of over-replacement of  vitamin D and agrees to not increase her dose unless he discusses this with [place] first. Laura Blackwell agrees to follow up with our clinic in 4 weeks.  We spent > than 50% of the 15 minute visit on the counseling as documented in the note.  Obesity Laura Blackwell is currently in the action stage of change. As such, her goal is to continue with weight loss efforts She has agreed to follow the Category  2 plan Laura Blackwell has been instructed to work up to a goal of 150 minutes of combined cardio and strengthening exercise per week for weight loss and overall health benefits. We discussed the following Behavioral Modification Strategies today: increasing lean protein intake and work on meal planning and easy cooking plans   Laura Blackwell has agreed to follow up with our clinic in 4 weeks. She was informed of the importance of frequent follow up visits to maximize her success with intensive lifestyle modifications for her multiple health conditions.  I, Laura Blackwell, am acting as transcriptionist for Laura Duverney, PA-C  I have reviewed the above documentation for accuracy and completeness, and I agree with the above. -Laura Duverney, PA-C  I have reviewed the above note and agree with the plan. -Laura Nip, MD     Today's visit was # 11 out of 22.  Starting weight: 303 lbs Starting date: 12/31/16 Today's weight : 294 lbs  Today's date: 08/12/2017 Total lbs lost to date: 9 (Patients must lose 7 lbs in the first 6 months to continue with counseling)   ASK: We discussed the diagnosis of obesity with Laura Blackwell today and Laura Blackwell agreed to give [place] permission to discuss obesity behavioral modification therapy today.  ASSESS: Laura Blackwell has the diagnosis of obesity and her BMI today is 46.04 Laura Blackwell is in the action stage of change   ADVISE: Laura Blackwell was educated on the multiple health risks of obesity as well as the benefit of weight loss to improve her health. She was advised of the need for long term treatment and  the importance of lifestyle modifications.  AGREE: Multiple dietary modification options and treatment options were discussed and  Laura Blackwell agreed to follow the Category 2 plan We discussed the following Behavioral Modification Strategies today: increasing lean protein intake and work on meal planning and easy cooking plans

## 2017-09-02 ENCOUNTER — Ambulatory Visit (INDEPENDENT_AMBULATORY_CARE_PROVIDER_SITE_OTHER): Payer: 59 | Admitting: Dietician

## 2017-09-02 VITALS — Ht 67.0 in | Wt 294.0 lb

## 2017-09-02 DIAGNOSIS — Z6841 Body Mass Index (BMI) 40.0 and over, adult: Secondary | ICD-10-CM

## 2017-09-02 DIAGNOSIS — Z9189 Other specified personal risk factors, not elsewhere classified: Secondary | ICD-10-CM

## 2017-09-02 NOTE — Progress Notes (Signed)
  Office: [phone]  /  Fax: [phone]     Marylen has a diagnosis of diabetes type II putting her at higher than average risk for cardiovascular disease.  Sherrita states BGs range between 150's and 160's and denies any hypoglycemic episodes. She does not have her glucose log with her today.  Last A1c was Hemoglobin A1C Latest Ref Rng & Units 05/04/2017 02/25/2017 12/31/2016  HGBA1C 4.8 - 5.6 % 7.3(H) 7.5 8.0(H)  Some recent data might be hidden    She has been working on intensive lifestyle modifications including diet, exercise, and weight loss to help control her blood glucose levels. Jacilyn is here today for CVD risk nutrition counseling which includes her obesity treatment plan and diabetic education.  Her weight today is 294 lbs, she has maintained her weight throughout the Christmas holidays. She has had a weight loss of 9 lbs since beginning treatment with [place]. She is on our Category 2 meal plan which she admit only following approximately 20% fo the time. Per discussion she has been making more mindful food choices and better portion control. She is motivated to get back to following her meal plan and continuing her weight loss efforts.  Patient was educated about food nutrients ie protein, fats, simple and complex carbohydrates and how these affect insulin response. Focus on portion control,  avoiding simple carbohydrates and lower fat foods for ongoing wt loss efforts and glucose management  Maeve is on the following meal plan: category 2 Her meal plan was individualized for maximum benefit.  Also discussed at length the following behavioral modifications to help maximize  Success: increasing lean protein intake, decreasing simple carbohydrates, increasing vegetables, increase water intake,  meal planning and cooking strategies, keeping healthy foods in the home.   Aryahi has been instructed to work up to a goal of 150 minutes of combined cardio and strengthening exercise per week for  weight loss and overall health benefits. Written information was provided and the following handouts were given: smart fruits, category 2 meal plan.    Office: [phone]  /  Fax: [phone]  OBESITY BEHAVIORAL INTERVENTION VISIT  Today's visit was # 12 out of 22.  Starting weight: 303 Starting date: 12/31/16 Today's weight : Weight: 294 lb (133.4 kg)  Today's date: 09/02/2017 Total lbs lost to date: 9 (Patients must lose 7 lbs in the first 6 months to continue with counseling)   ASK: We discussed the diagnosis of obesity with Rockne Menghini today and Ciji agreed to give [place] permission to discuss obesity behavioral modification therapy today.  ASSESS: Tesia has the diagnosis of obesity and her BMI today is 46.04 Hayly is in the action stage of change   ADVISE: Brett was educated on the multiple health risks of obesity as well as the benefit of weight loss to improve her health. She was advised of the need for long term treatment and the importance of lifestyle modifications.  AGREE: Multiple dietary modification options and treatment options were discussed and  Marvis agreed to follow the Category 2 plan We discussed the following Behavioral Modification Stratagies today: increasing lean protein intake, decreasing simple carbohydrates , increasing vegetables and increasing lower sugar fruits  I have reviewed the above documentation for accuracy and completeness, and I agree with the above. -Dennard Nip, MD

## 2017-09-09 ENCOUNTER — Other Ambulatory Visit (INDEPENDENT_AMBULATORY_CARE_PROVIDER_SITE_OTHER): Payer: Self-pay

## 2017-09-09 ENCOUNTER — Telehealth (INDEPENDENT_AMBULATORY_CARE_PROVIDER_SITE_OTHER): Payer: Self-pay | Admitting: Family Medicine

## 2017-09-09 ENCOUNTER — Ambulatory Visit (INDEPENDENT_AMBULATORY_CARE_PROVIDER_SITE_OTHER): Payer: 59 | Admitting: Physician Assistant

## 2017-09-09 DIAGNOSIS — E119 Type 2 diabetes mellitus without complications: Secondary | ICD-10-CM

## 2017-09-09 DIAGNOSIS — E559 Vitamin D deficiency, unspecified: Secondary | ICD-10-CM

## 2017-09-09 MED ORDER — LIRAGLUTIDE 18 MG/3ML ~~LOC~~ SOPN: 1.2000 mg | PEN_INJECTOR | Freq: Every morning | SUBCUTANEOUS | 0 refills | Status: DC

## 2017-09-09 MED ORDER — INSULIN PEN NEEDLE 32G X 4 MM MISC: 1.0000 | Freq: Every day | 0 refills | Status: AC

## 2017-09-09 MED ORDER — VITAMIN D (ERGOCALCIFEROL) 1.25 MG (50000 UNIT) PO CAPS: 50000.0000 [IU] | ORAL_CAPSULE | ORAL | 0 refills | Status: DC

## 2017-09-09 NOTE — Telephone Encounter (Signed)
req refills , victoza, nano ultra fine needles, vitamin  [facility] [address]

## 2017-09-09 NOTE — Telephone Encounter (Signed)
Patient refills sent to her pharmacy as requested. April, [place]

## 2017-10-04 ENCOUNTER — Ambulatory Visit (INDEPENDENT_AMBULATORY_CARE_PROVIDER_SITE_OTHER): Payer: 59 | Admitting: Physician Assistant

## 2017-10-04 VITALS — BP 145/92 | HR 93 | Temp 98.3°F | Ht 67.0 in | Wt 292.0 lb

## 2017-10-04 DIAGNOSIS — Z6841 Body Mass Index (BMI) 40.0 and over, adult: Secondary | ICD-10-CM

## 2017-10-04 DIAGNOSIS — I10 Essential (primary) hypertension: Secondary | ICD-10-CM

## 2017-10-04 DIAGNOSIS — E119 Type 2 diabetes mellitus without complications: Secondary | ICD-10-CM | POA: Diagnosis not present

## 2017-10-05 NOTE — Progress Notes (Signed)
Office: [phone]  /  Fax: [phone]   HPI:   Chief Complaint: OBESITY Laura Blackwell is here to discuss her progress with her obesity treatment plan. She is on the Category 2 plan and is following her eating plan approximately 25 % of the time. She Blackwell she is walking for 15 minutes 5 times per week. Vidhi continues to do well with weight loss. She is mindful of her eating, but she has been eating out at dinner. Her weight is 292 lb (132.5 kg) today and has had a weight loss of 2 pounds over a period of 7 to 8 weeks since her last visit. She has lost 11 lbs since starting treatment with [place].  Hypertension Laura Blackwell is a 62 y.o. female with hypertension. Her blood pressure is elevated at 145/92. She Blackwell she has not been watching her sodium intake and that contributed to her elevated blood pressure. Laura Blackwell denies chest pain or shortness of breath on exertion. She is working weight loss to help control her blood pressure with the goal of decreasing her risk of heart attack and stroke. Laura Blackwell blood pressure is not currently controlled.  Diabetes II non insulin without complications Laura Blackwell has a diagnosis of diabetes type II. Laura Blackwell Blackwell fasting BGs range between 120 and 200's and denies any hypoglycemic episodes. Laura Blackwell she has deviated with her dinner meals and the high numbers are associated with high calorie, high carbohydrate dinners. She is currently taking Victoza and Metformin She has been working on intensive lifestyle modifications including diet, exercise, and weight loss to help control her blood glucose levels.  ALLERGIES: No Known Allergies  MEDICATIONS: Current Outpatient Medications on File Prior to Visit  Medication Sig Dispense Refill  . amLODipine (NORVASC) 5 MG tablet Take 5 mg by mouth daily.     Marland Kitchen atorvastatin (LIPITOR) 20 MG tablet Take 20 mg by mouth daily.     . furosemide (LASIX) 20 MG tablet Take 20 mg by mouth 2 (two) times  daily.     Marland Kitchen glucose blood (ONETOUCH VERIO) test strip 1 each by Other route 2 (two) times daily. Use as instructed    . Insulin Pen Needle (BD PEN NEEDLE NANO U/F) 32G X 4 MM MISC 1 each by Does not apply route daily. 100 each 0  . liraglutide (VICTOZA) 18 MG/3ML SOPN Inject 0.2 mLs (1.2 mg total) into the skin every morning. 2 pen 0  . losartan (COZAAR) 100 MG tablet Take 100 mg by mouth every morning.     . metFORMIN (GLUCOPHAGE) 500 MG tablet Take 1,000 mg by mouth 2 (two) times daily with a meal.     . Vitamin D, Ergocalciferol, (DRISDOL) 50000 units CAPS capsule Take 1 capsule (50,000 Units total) by mouth every 7 (seven) days. 4 capsule 0   No current facility-administered medications on file prior to visit.     PAST MEDICAL HISTORY: Past Medical History:  Diagnosis Date  . Arthritis   . Diabetes mellitus without complication ([place])   . Edema    in left leg in 1980s on left ankle   . Hypertension   . PCOS (polycystic ovarian syndrome)   . Swelling   . Vitamin D deficiency     PAST SURGICAL HISTORY: Past Surgical History:  Procedure Laterality Date  . BREAST BIOPSY Right   . JOINT REPLACEMENT  2011    right hip   . left ankle surgery     . TOTAL HIP ARTHROPLASTY Left 12/02/2015  Procedure: LEFT TOTAL HIP ARTHROPLASTY ANTERIOR APPROACH;  Surgeon: [name], MD;  Location: [facility];  Service: Orthopedics;  Laterality: Left;    SOCIAL HISTORY: Social History   Tobacco Use  . Smoking status: Former Smoker    Last attempt to quit: 10/01/2002    Years since quitting: 15.0  . Smokeless tobacco: Never Used  Substance Use Topics  . Alcohol use: No  . Drug use: No    FAMILY HISTORY: Family History  Problem Relation Age of Onset  . Diabetes Mother   . Hypertension Mother     ROS: Review of Systems  Constitutional: Positive for weight loss.  Respiratory: Negative for shortness of breath (on exertion).   Cardiovascular: Negative for chest pain.  Endo/Heme/Allergies:        Negative hypoglycemia    PHYSICAL EXAM: Blood pressure (!) 145/92, pulse 93, temperature 98.3 F (36.8 C), temperature source Oral, height 5\' 7"  (1.702 m), weight 292 lb (132.5 kg), SpO2 97 %. Body mass index is 45.73 kg/m. Physical Exam  Constitutional: She is oriented to person, place, and time. She appears well-developed and well-nourished.  Cardiovascular: Normal rate.  Pulmonary/Chest: Effort normal.  Musculoskeletal: Normal range of motion.  Neurological: She is oriented to person, place, and time.  Skin: Skin is warm and dry.  Psychiatric: She has a normal mood and affect. Her behavior is normal.  Vitals reviewed.   RECENT LABS AND TESTS: BMET    Component Value Date/Time   NA 141 05/04/2017 0818   K 4.3 05/04/2017 0818   CL 100 05/04/2017 0818   CO2 22 05/04/2017 0818   GLUCOSE 120 (H) 05/04/2017 0818   GLUCOSE 239 (H) 12/03/2015 0354   BUN 11 05/04/2017 0818   CREATININE 0.60 05/04/2017 0818   CALCIUM 10.3 (H) 05/04/2017 0818   GFRNONAA 100 05/04/2017 0818   GFRAA 115 05/04/2017 0818   Lab Results  Component Value Date   HGBA1C 7.3 (H) 05/04/2017   HGBA1C 7.5 02/25/2017   HGBA1C 8.0 (H) 12/31/2016   Lab Results  Component Value Date   INSULIN 26.2 (H) 05/04/2017   INSULIN 21.3 12/31/2016   CBC    Component Value Date/Time   WBC 6.1 12/31/2016 1032   WBC 12.8 (H) 12/03/2015 0354   RBC 5.01 12/31/2016 1032   RBC 4.12 12/03/2015 0354   HGB 13.3 12/31/2016 1032   HCT 40.2 12/31/2016 1032   PLT 223 12/31/2016 1032   MCV 80 12/31/2016 1032   MCH 26.5 (L) 12/31/2016 1032   MCH 27.7 12/03/2015 0354   MCHC 33.1 12/31/2016 1032   MCHC 33.3 12/03/2015 0354   RDW 16.3 (H) 12/31/2016 1032   LYMPHSABS 1.2 12/31/2016 1032   EOSABS 0.1 12/31/2016 1032   BASOSABS 0.0 12/31/2016 1032   Iron/TIBC/Ferritin/ %Sat No results found for: IRON, TIBC, FERRITIN, IRONPCTSAT Lipid Panel     Component Value Date/Time   CHOL 148 12/31/2016 1032   TRIG 61  12/31/2016 1032   HDL 58 12/31/2016 1032   LDLCALC 78 12/31/2016 1032   Hepatic Function Panel     Component Value Date/Time   PROT 6.9 05/04/2017 0818   ALBUMIN 4.1 05/04/2017 0818   AST 9 05/04/2017 0818   ALT 13 05/04/2017 0818   ALKPHOS 80 05/04/2017 0818   BILITOT 0.2 05/04/2017 0818      Component Value Date/Time   TSH 0.508 12/31/2016 1032    ASSESSMENT AND PLAN: Class 3 severe obesity with serious comorbidity and body mass index (BMI) of  45.0 to 49.9 in adult, unspecified obesity type ([place])  Essential hypertension  Type 2 diabetes mellitus without complication, without long-term current use of insulin (H[facility])  PLAN:  Hypertension We discussed sodium restriction, working on healthy weight loss, and a regular exercise program as the means to achieve improved blood pressure control. Laura Blackwell agreed with this plan and agreed to follow up as directed. We will continue to monitor her blood pressure as well as her progress with the above lifestyle modifications. She will continue her medications as prescribed and will watch for signs of hypotension as she continues her lifestyle modifications.  Diabetes II non insulin without complications Laura Blackwell by myself today including ideal fasting and post-prandial blood glucose readings, individual ideal Hgb A1c goals and hypoglycemia prevention. We discussed the importance of good blood sugar control to decrease the likelihood of diabetic complications such as nephropathy, neuropathy, limb loss, blindness, coronary artery disease, and death. We discussed the importance of intensive lifestyle modification including diet, exercise and weight loss as the first line treatment for diabetes. Yarelli agrees to continue her diabetes medications and will follow up at the agreed upon time.  We spent > than 50% of the 15 minute visit on the counseling as documented in the note.  Obesity Laura Blackwell is currently in  the action stage of change. As such, her goal is to continue with weight loss efforts She has agreed to follow the Category 2 plan Laura Blackwell has been instructed to work up to a goal of 150 minutes of combined cardio and strengthening exercise per week for weight loss and overall health benefits. We discussed the following Behavioral Modification Strategies today: increasing lean protein intake and work on meal planning and easy cooking plans  Laura Blackwell has agreed to follow up with our clinic in 2 to 3 weeks. She was informed of the importance of frequent follow up visits to maximize her success with intensive lifestyle modifications for her multiple health conditions.   OBESITY BEHAVIORAL INTERVENTION VISIT  Today's visit was # 12 out of 22.  Starting weight: 303 lbs Starting date: 12/31/16 Today's weight : 292 lbs Today's date: 10/04/2017 Total lbs lost to date: 11 (Patients must lose 7 lbs in the first 6 months to continue with counseling)   ASK: We discussed the diagnosis of obesity with Laura Blackwell today and Laura Blackwell agreed to give [place] permission to discuss obesity behavioral modification therapy today.  ASSESS: Laura Blackwell has the diagnosis of obesity and her BMI today is 45.72 Laura Blackwell is in the action stage of change   ADVISE: Laura Blackwell was educated on the multiple health risks of obesity as well as the benefit of weight loss to improve her health. She was advised of the need for long term treatment and the importance of lifestyle modifications.  AGREE: Multiple dietary modification options and treatment options were discussed and  Laura Blackwell agreed to the above obesity treatment plan.   Corey Skains, am acting as transcriptionist for [employer], PA-C I, [facility], have reviewed this note and agree with its content.

## 2017-10-06 DIAGNOSIS — M65331 Trigger finger, right middle finger: Secondary | ICD-10-CM | POA: Diagnosis not present

## 2017-10-06 DIAGNOSIS — M79641 Pain in right hand: Secondary | ICD-10-CM | POA: Diagnosis not present

## 2017-10-22 DIAGNOSIS — Z96643 Presence of artificial hip joint, bilateral: Secondary | ICD-10-CM | POA: Diagnosis not present

## 2017-10-22 DIAGNOSIS — M160 Bilateral primary osteoarthritis of hip: Secondary | ICD-10-CM | POA: Diagnosis not present

## 2017-10-22 DIAGNOSIS — Z471 Aftercare following joint replacement surgery: Secondary | ICD-10-CM | POA: Diagnosis not present

## 2017-10-27 ENCOUNTER — Encounter (INDEPENDENT_AMBULATORY_CARE_PROVIDER_SITE_OTHER): Payer: Self-pay

## 2017-10-27 ENCOUNTER — Ambulatory Visit (INDEPENDENT_AMBULATORY_CARE_PROVIDER_SITE_OTHER): Payer: 59 | Admitting: Physician Assistant

## 2017-10-28 DIAGNOSIS — R05 Cough: Secondary | ICD-10-CM | POA: Diagnosis not present

## 2017-10-28 DIAGNOSIS — E1165 Type 2 diabetes mellitus with hyperglycemia: Secondary | ICD-10-CM | POA: Diagnosis not present

## 2017-10-29 DIAGNOSIS — Z96641 Presence of right artificial hip joint: Secondary | ICD-10-CM | POA: Diagnosis not present

## 2017-11-03 ENCOUNTER — Ambulatory Visit (INDEPENDENT_AMBULATORY_CARE_PROVIDER_SITE_OTHER): Payer: 59 | Admitting: Physician Assistant

## 2017-11-08 DIAGNOSIS — Z96641 Presence of right artificial hip joint: Secondary | ICD-10-CM | POA: Diagnosis not present

## 2017-11-19 DIAGNOSIS — M1611 Unilateral primary osteoarthritis, right hip: Secondary | ICD-10-CM | POA: Diagnosis not present

## 2017-11-19 DIAGNOSIS — M25551 Pain in right hip: Secondary | ICD-10-CM | POA: Diagnosis not present

## 2017-11-19 DIAGNOSIS — M7061 Trochanteric bursitis, right hip: Secondary | ICD-10-CM | POA: Diagnosis not present

## 2017-11-22 DIAGNOSIS — E119 Type 2 diabetes mellitus without complications: Secondary | ICD-10-CM | POA: Diagnosis not present

## 2017-11-22 DIAGNOSIS — J069 Acute upper respiratory infection, unspecified: Secondary | ICD-10-CM | POA: Diagnosis not present

## 2017-11-22 DIAGNOSIS — B309 Viral conjunctivitis, unspecified: Secondary | ICD-10-CM | POA: Diagnosis not present

## 2017-12-13 DIAGNOSIS — E119 Type 2 diabetes mellitus without complications: Secondary | ICD-10-CM | POA: Diagnosis not present

## 2017-12-31 DIAGNOSIS — M25551 Pain in right hip: Secondary | ICD-10-CM | POA: Diagnosis not present

## 2017-12-31 DIAGNOSIS — M7061 Trochanteric bursitis, right hip: Secondary | ICD-10-CM | POA: Diagnosis not present

## 2018-01-04 ENCOUNTER — Other Ambulatory Visit (HOSPITAL_COMMUNITY): Payer: Self-pay | Admitting: Orthopedic Surgery

## 2018-01-04 DIAGNOSIS — Z96641 Presence of right artificial hip joint: Secondary | ICD-10-CM

## 2018-02-03 ENCOUNTER — Encounter (HOSPITAL_COMMUNITY): Payer: Self-pay

## 2018-02-03 ENCOUNTER — Encounter (HOSPITAL_COMMUNITY): Payer: 59

## 2018-02-03 DIAGNOSIS — I10 Essential (primary) hypertension: Secondary | ICD-10-CM | POA: Diagnosis not present

## 2018-02-03 DIAGNOSIS — E785 Hyperlipidemia, unspecified: Secondary | ICD-10-CM | POA: Diagnosis not present

## 2018-02-03 DIAGNOSIS — E1169 Type 2 diabetes mellitus with other specified complication: Secondary | ICD-10-CM | POA: Diagnosis not present

## 2018-03-11 ENCOUNTER — Ambulatory Visit: Payer: Self-pay | Admitting: Family Medicine

## 2018-04-05 DIAGNOSIS — Z23 Encounter for immunization: Secondary | ICD-10-CM | POA: Diagnosis not present

## 2018-05-31 DIAGNOSIS — E1165 Type 2 diabetes mellitus with hyperglycemia: Secondary | ICD-10-CM | POA: Diagnosis not present

## 2018-07-19 DIAGNOSIS — Z96642 Presence of left artificial hip joint: Secondary | ICD-10-CM | POA: Diagnosis not present

## 2018-07-19 DIAGNOSIS — Z471 Aftercare following joint replacement surgery: Secondary | ICD-10-CM | POA: Diagnosis not present

## 2018-07-25 ENCOUNTER — Ambulatory Visit: Payer: 59 | Admitting: Family Medicine

## 2018-07-25 ENCOUNTER — Encounter: Payer: Self-pay | Admitting: Family Medicine

## 2018-07-25 VITALS — BP 141/85 | HR 79 | Ht 67.0 in | Wt 264.0 lb

## 2018-07-25 DIAGNOSIS — M5416 Radiculopathy, lumbar region: Secondary | ICD-10-CM | POA: Diagnosis not present

## 2018-07-25 MED ORDER — PREDNISONE 10 MG PO TABS: ORAL_TABLET | ORAL | 0 refills | Status: DC

## 2018-07-25 NOTE — Patient Instructions (Signed)
You have lumbar radiculopathy (a pinched nerve in your low back). Take tylenol for baseline pain relief (1-2 extra strength tabs 3x/day) A prednisone dose pack is the best option for immediate relief and may be prescribed. Day after finishing prednisone start aleve 2 tabs twice a day with food for pain and inflammation only if needed. Robaxin as needed for muscle spasms (no driving on this medicine if it makes you sleepy). Stay as active as possible. Physical therapy has been shown to be helpful as well - start this next week. Strengthening of low back muscles, abdominal musculature are key for long term pain relief. If not improving, will consider further imaging (MRI). Call me in a week to let me know how you're doing.

## 2018-07-26 ENCOUNTER — Telehealth: Payer: Self-pay | Admitting: Family Medicine

## 2018-07-26 ENCOUNTER — Encounter: Payer: Self-pay | Admitting: Family Medicine

## 2018-07-26 MED ORDER — TIZANIDINE HCL 4 MG PO TABS: 4.0000 mg | ORAL_TABLET | Freq: Three times a day (TID) | ORAL | 1 refills | Status: DC | PRN

## 2018-07-26 NOTE — Telephone Encounter (Signed)
Patient requesting a new muscle relaxant Patient states Robaxin has not helped her in the past and she does not plan on picking up new prescription that was sent in by Dr. Wynelle Link  [facility] : [facility] [address]

## 2018-07-26 NOTE — Progress Notes (Signed)
PCP: Carol Ada, MD  Subjective:   HPI: Patient is a 62 y.o. female here for low back pain.  Patient reports back on November 5 she had some soreness in her lower back with cold weather. Pain persisted as a soreness until November 19 when she woke up with severe pain in the left side of her low back shooting into her leg with associated numbness down to her foot. She tried Aleve and ibuprofen and noticed the ibuprofen did help some. Pain is a 10 out of 10 level now and sharp.  Is difficult for her to get comfortable. She saw Dr. Wynelle Link thinking this was coming from her hip and was reassured that is not from this. He had placed her on Robaxin but she has not started taking this yet. No bladder dysfunction.  She states she had one episode where she felt the urgency but did not get to the restroom in time to have a bowel movement. No skin changes. No prior history of back problems. No saddle anesthesia.  Past Medical History:  Diagnosis Date  . Arthritis   . Diabetes mellitus without complication ([place])   . Edema    in left leg in 1980s on left ankle   . Hypertension   . PCOS (polycystic ovarian syndrome)   . Swelling   . Vitamin D deficiency     Current Outpatient Medications on File Prior to Visit  Medication Sig Dispense Refill  . amLODipine (NORVASC) 5 MG tablet Take 5 mg by mouth daily.     Marland Kitchen atorvastatin (LIPITOR) 20 MG tablet Take 20 mg by mouth daily.     . furosemide (LASIX) 20 MG tablet Take 20 mg by mouth 2 (two) times daily.     Marland Kitchen glimepiride (AMARYL) 2 MG tablet glimepiride 2 mg tablet    . glucose blood (ONETOUCH VERIO) test strip 1 each by Other route 2 (two) times daily. Use as instructed    . Insulin Pen Needle (BD PEN NEEDLE NANO U/F) 32G X 4 MM MISC 1 each by Does not apply route daily. 100 each 0  . INVOKANA 100 MG TABS tablet Take 100 mg by mouth daily.  5  . liraglutide (VICTOZA) 18 MG/3ML SOPN Inject 0.2 mLs (1.2 mg total) into the skin every morning. 2  pen 0  . losartan (COZAAR) 100 MG tablet Take 100 mg by mouth every morning.     . metFORMIN (GLUCOPHAGE) 500 MG tablet Take 1,000 mg by mouth 2 (two) times daily with a meal.     . methocarbamol (ROBAXIN) 500 MG tablet methocarbamol 500 mg tablet  Take 1 tablet 3 times a day by oral route as needed for 20 days.    . SOLIQUA 100-33 UNT-MCG/ML SOPN INJECT 32 UNITS PRIOR TO BREAKFAST EVERY DAY  2  . Vitamin D, Ergocalciferol, (DRISDOL) 50000 units CAPS capsule Take 1 capsule (50,000 Units total) by mouth every 7 (seven) days. 4 capsule 0   No current facility-administered medications on file prior to visit.     Past Surgical History:  Procedure Laterality Date  . BREAST BIOPSY Right   . JOINT REPLACEMENT  2011    right hip   . left ankle surgery     . TOTAL HIP ARTHROPLASTY Left 12/02/2015   Procedure: LEFT TOTAL HIP ARTHROPLASTY ANTERIOR APPROACH;  Surgeon: [name], MD;  Location: [facility];  Service: Orthopedics;  Laterality: Left;    No Known Allergies  Social History   Socioeconomic History  .  Marital status: Single    Spouse name: Not on file  . Number of children: Not on file  . Years of education: Not on file  . Highest education level: Not on file  Occupational History  . Occupation: Therapist, art  Social Needs  . Financial resource strain: Not on file  . Food insecurity:    Worry: Not on file    Inability: Not on file  . Transportation needs:    Medical: Not on file    Non-medical: Not on file  Tobacco Use  . Smoking status: Former Smoker    Last attempt to quit: 10/01/2002    Years since quitting: 15.8  . Smokeless tobacco: Never Used  Substance and Sexual Activity  . Alcohol use: No  . Drug use: No  . Sexual activity: Not on file  Lifestyle  . Physical activity:    Days per week: Not on file    Minutes per session: Not on file  . Stress: Not on file  Relationships  . Social connections:    Talks on phone: Not on file    Gets together: Not on  file    Attends religious service: Not on file    Active member of club or organization: Not on file    Attends meetings of clubs or organizations: Not on file    Relationship status: Not on file  . Intimate partner violence:    Fear of current or ex partner: Not on file    Emotionally abused: Not on file    Physically abused: Not on file    Forced sexual activity: Not on file  Other Topics Concern  . Not on file  Social History Narrative  . Not on file    Family History  Problem Relation Age of Onset  . Diabetes Mother   . Hypertension Mother     BP (!) 141/85   Pulse 79   Ht 5\' 7"  (1.702 m)   Wt 264 lb (119.7 kg)   BMI 41.35 kg/m   Review of Systems: See HPI above.     Objective:  Physical Exam:  Gen: NAD, comfortable in exam room  Back: No gross deformity, scoliosis. TTP minimally left lumbar paraspinal region.  No midline or bony TTP. FROM with pain on flexion. Strength LEs 5/5 all muscle groups.   2+ MSRs in patellar and achilles tendons, equal bilaterally. Negative SLRs. Sensation diminished left lateral lower leg and dorsal foot.  Bilateral hips: No deformity. FROM with 5/5 strength. Mild tenderness left greater trochanter. NVI distally. Negative logroll bilateral hips Negative fabers and piriformis stretches.   Assessment & Plan:  1. Lumbar radiculopathy - patient had recent normal evaluation by her orthopedist re: her left hip replacement.  Her history is consistent with radiculopathy - do not think her one episode with bowel constitutes incontinence/concern for cauda equina.  We discussed options and she will start prednisone Dosepak with transition to Aleve after she finishes this.  Robaxin as needed.  Plan to start physical therapy next week if she is improving with the prednisone.  She is not improving will consider MRI.  We did discuss her history of diabetes and that this would increase her blood sugars-note she is not a type I diabetic.  She will  call us in a week to let us know how she is doing.

## 2018-07-26 NOTE — Telephone Encounter (Signed)
Sent in Tizanidine instead.  Thanks!

## 2018-07-26 NOTE — Telephone Encounter (Signed)
Patient was informed.

## 2018-08-02 ENCOUNTER — Telehealth: Payer: Self-pay | Admitting: Family Medicine

## 2018-08-02 DIAGNOSIS — M545 Low back pain: Secondary | ICD-10-CM | POA: Diagnosis not present

## 2018-08-02 DIAGNOSIS — M2560 Stiffness of unspecified joint, not elsewhere classified: Secondary | ICD-10-CM | POA: Diagnosis not present

## 2018-08-02 DIAGNOSIS — M5416 Radiculopathy, lumbar region: Secondary | ICD-10-CM | POA: Diagnosis not present

## 2018-08-02 MED ORDER — IBUPROFEN 800 MG PO TABS: 800.0000 mg | ORAL_TABLET | Freq: Three times a day (TID) | ORAL | 1 refills | Status: DC | PRN

## 2018-08-02 NOTE — Telephone Encounter (Signed)
Patient requesting Rx of Ibuprofen 800 mg to take while in physical therapy   [facility] [address]

## 2018-08-02 NOTE — Telephone Encounter (Signed)
Patient informed. 

## 2018-08-02 NOTE — Telephone Encounter (Signed)
Sent in for her

## 2018-08-03 ENCOUNTER — Ambulatory Visit (INDEPENDENT_AMBULATORY_CARE_PROVIDER_SITE_OTHER): Payer: 59 | Admitting: Family Medicine

## 2018-08-03 ENCOUNTER — Encounter: Payer: Self-pay | Admitting: Family Medicine

## 2018-08-03 VITALS — BP 182/96 | Ht 67.0 in | Wt 264.0 lb

## 2018-08-03 DIAGNOSIS — M545 Low back pain, unspecified: Secondary | ICD-10-CM

## 2018-08-03 MED ORDER — DICLOFENAC SODIUM 75 MG PO TBEC: 75.0000 mg | DELAYED_RELEASE_TABLET | Freq: Two times a day (BID) | ORAL | 1 refills | Status: DC

## 2018-08-03 MED ORDER — KETOROLAC TROMETHAMINE 60 MG/2ML IM SOLN
60.0000 mg | Freq: Once | INTRAMUSCULAR | Status: AC
  Administered 2018-08-03: 60 mg via INTRAMUSCULAR

## 2018-08-03 MED ORDER — GABAPENTIN 300 MG PO CAPS: 300.0000 mg | ORAL_CAPSULE | Freq: Three times a day (TID) | ORAL | 1 refills | Status: DC

## 2018-08-03 MED ORDER — METHYLPREDNISOLONE ACETATE 80 MG/ML IJ SUSP
80.0000 mg | Freq: Once | INTRAMUSCULAR | Status: AC
  Administered 2018-08-03: 80 mg via INTRAMUSCULAR

## 2018-08-03 MED ORDER — HYDROCODONE-ACETAMINOPHEN 5-325 MG PO TABS: 1.0000 | ORAL_TABLET | ORAL | 0 refills | Status: DC | PRN

## 2018-08-03 NOTE — Progress Notes (Signed)
PCP: Carol Ada, MD  Subjective:   HPI: Patient is a 62 y.o. female here for low back pain.  11/25: Patient reports back on November 5 she had some soreness in her lower back with cold weather. Pain persisted as a soreness until November 19 when she woke up with severe pain in the left side of her low back shooting into her leg with associated numbness down to her foot. She tried Aleve and ibuprofen and noticed the ibuprofen did help some. Pain is a 10 out of 10 level now and sharp.  Is difficult for her to get comfortable. She saw Dr. Wynelle Link thinking this was coming from her hip and was reassured that is not from this. He had placed her on Robaxin but she has not started taking this yet. No bladder dysfunction.  She states she had one episode where she felt the urgency but did not get to the restroom in time to have a bowel movement. No skin changes. No prior history of back problems. No saddle anesthesia.  12/4: Patient returns with severe 10 out of 10 left-sided low back pain radiating into the left leg. She has started physical therapy. States that the prednisone did not help her. She is taking ibuprofen and tizanidine. Denies any bowel or bladder dysfunction. Pain is so severe she cannot get comfortable.  Past Medical History:  Diagnosis Date  . Arthritis   . Diabetes mellitus without complication ([place])   . Edema    in left leg in 1980s on left ankle   . Hypertension   . PCOS (polycystic ovarian syndrome)   . Swelling   . Vitamin D deficiency     Current Outpatient Medications on File Prior to Visit  Medication Sig Dispense Refill  . amLODipine (NORVASC) 5 MG tablet Take 5 mg by mouth daily.     Marland Kitchen atorvastatin (LIPITOR) 20 MG tablet Take 20 mg by mouth daily.     . furosemide (LASIX) 20 MG tablet Take 20 mg by mouth 2 (two) times daily.     Marland Kitchen glimepiride (AMARYL) 2 MG tablet glimepiride 2 mg tablet    . glucose blood (ONETOUCH VERIO) test strip 1 each by Other  route 2 (two) times daily. Use as instructed    . ibuprofen (ADVIL,MOTRIN) 800 MG tablet Take 1 tablet (800 mg total) by mouth every 8 (eight) hours as needed. 90 tablet 1  . Insulin Pen Needle (BD PEN NEEDLE NANO U/F) 32G X 4 MM MISC 1 each by Does not apply route daily. 100 each 0  . INVOKANA 100 MG TABS tablet Take 100 mg by mouth daily.  5  . liraglutide (VICTOZA) 18 MG/3ML SOPN Inject 0.2 mLs (1.2 mg total) into the skin every morning. 2 pen 0  . losartan (COZAAR) 100 MG tablet Take 100 mg by mouth every morning.     . metFORMIN (GLUCOPHAGE) 500 MG tablet Take 1,000 mg by mouth 2 (two) times daily with a meal.     . methocarbamol (ROBAXIN) 500 MG tablet methocarbamol 500 mg tablet  Take 1 tablet 3 times a day by oral route as needed for 20 days.    . predniSONE (DELTASONE) 10 MG tablet 6 tabs po day 1, 5 tabs po day 2, 4 tabs po day 3, 3 tabs po day 4, 2 tabs po day 5, 1 tab po day 6 21 tablet 0  . SOLIQUA 100-33 UNT-MCG/ML SOPN INJECT 32 UNITS PRIOR TO BREAKFAST EVERY DAY  2  .  tiZANidine (ZANAFLEX) 4 MG tablet Take 1 tablet (4 mg total) by mouth every 8 (eight) hours as needed. 60 tablet 1  . Vitamin D, Ergocalciferol, (DRISDOL) 50000 units CAPS capsule Take 1 capsule (50,000 Units total) by mouth every 7 (seven) days. 4 capsule 0   No current facility-administered medications on file prior to visit.     Past Surgical History:  Procedure Laterality Date  . BREAST BIOPSY Right   . JOINT REPLACEMENT  2011    right hip   . left ankle surgery     . TOTAL HIP ARTHROPLASTY Left 12/02/2015   Procedure: LEFT TOTAL HIP ARTHROPLASTY ANTERIOR APPROACH;  Surgeon: [name], MD;  Location: [facility];  Service: Orthopedics;  Laterality: Left;    No Known Allergies  Social History   Socioeconomic History  . Marital status: Single    Spouse name: Not on file  . Number of children: Not on file  . Years of education: Not on file  . Highest education level: Not on file  Occupational History   . Occupation: Therapist, art  Social Needs  . Financial resource strain: Not on file  . Food insecurity:    Worry: Not on file    Inability: Not on file  . Transportation needs:    Medical: Not on file    Non-medical: Not on file  Tobacco Use  . Smoking status: Former Smoker    Last attempt to quit: 10/01/2002    Years since quitting: 15.8  . Smokeless tobacco: Never Used  Substance and Sexual Activity  . Alcohol use: No  . Drug use: No  . Sexual activity: Not on file  Lifestyle  . Physical activity:    Days per week: Not on file    Minutes per session: Not on file  . Stress: Not on file  Relationships  . Social connections:    Talks on phone: Not on file    Gets together: Not on file    Attends religious service: Not on file    Active member of club or organization: Not on file    Attends meetings of clubs or organizations: Not on file    Relationship status: Not on file  . Intimate partner violence:    Fear of current or ex partner: Not on file    Emotionally abused: Not on file    Physically abused: Not on file    Forced sexual activity: Not on file  Other Topics Concern  . Not on file  Social History Narrative  . Not on file    Family History  Problem Relation Age of Onset  . Diabetes Mother   . Hypertension Mother     BP (!) 182/96   Ht 5\' 7"  (1.702 m)   Wt 264 lb (119.7 kg)   BMI 41.35 kg/m   Review of Systems: See HPI above.     Objective:  Physical Exam:  Gen: NAD, comfortable in exam room  Back: No gross deformity, scoliosis. No TTP.  No midline or bony TTP. ROM limited to 60 degrees flexion, painful.  Full extension. Strength LEs 5/5 all muscle groups except 3/5 left hip flexion.   2+ MSRs in patellar and achilles tendons, equal bilaterally. Positive SLR on left, negative right. Sensation intact to light touch bilaterally. Negative logroll bilateral hips   Assessment & Plan:  1. Lumbar radiculopathy -unfortunately patient  continues to struggle with pain and now has some weakness with left hip flexion.  She did not notice  improvement with prednisone Dosepak and is doing physical therapy and home exercises.  She will be given an IM injection of Toradol with Depo-Medrol.  We will start her on diclofenac with gabapentin and hydrocodone if needed.  We will order an MRI to assess for progressive disc herniation leading to lumbar radiculopathy on the left.  Follow-up will depend on the results.  Concerned that she may need to proceed with neurosurgery referral versus epidural steroid injections.

## 2018-08-03 NOTE — Patient Instructions (Signed)
You have lumbar radiculopathy (a pinched nerve in your low back). Diclofenac twice a day with food for pain and inflammation. Norco as needed for severe pain (no driving on this medicine). Tizanidine as needed for muscle spasms (no driving on this medicine if it makes you sleepy). Try the gabapentin at nighttime - if doing well and it's not making you too sleepy you can take this three times a day. Stay as active as possible. Physical therapy has been shown to be helpful as well - hold this if it worsens your pain too much. Strengthening of low back muscles, abdominal musculature are key for long term pain relief. We will go ahead with an MRI as well Follow up will depend on the MRI.

## 2018-08-10 DIAGNOSIS — M2560 Stiffness of unspecified joint, not elsewhere classified: Secondary | ICD-10-CM | POA: Diagnosis not present

## 2018-08-10 DIAGNOSIS — M545 Low back pain: Secondary | ICD-10-CM | POA: Diagnosis not present

## 2018-08-10 DIAGNOSIS — M5416 Radiculopathy, lumbar region: Secondary | ICD-10-CM | POA: Diagnosis not present

## 2018-08-11 DIAGNOSIS — M2560 Stiffness of unspecified joint, not elsewhere classified: Secondary | ICD-10-CM | POA: Diagnosis not present

## 2018-08-11 DIAGNOSIS — M5416 Radiculopathy, lumbar region: Secondary | ICD-10-CM | POA: Diagnosis not present

## 2018-08-11 DIAGNOSIS — M545 Low back pain: Secondary | ICD-10-CM | POA: Diagnosis not present

## 2018-08-17 DIAGNOSIS — M5416 Radiculopathy, lumbar region: Secondary | ICD-10-CM | POA: Diagnosis not present

## 2018-08-17 DIAGNOSIS — M2560 Stiffness of unspecified joint, not elsewhere classified: Secondary | ICD-10-CM | POA: Diagnosis not present

## 2018-08-17 DIAGNOSIS — M545 Low back pain: Secondary | ICD-10-CM | POA: Diagnosis not present

## 2018-08-19 ENCOUNTER — Other Ambulatory Visit: Payer: Self-pay

## 2018-08-29 ENCOUNTER — Other Ambulatory Visit: Payer: Self-pay | Admitting: Family Medicine

## 2018-08-29 DIAGNOSIS — Z1231 Encounter for screening mammogram for malignant neoplasm of breast: Secondary | ICD-10-CM

## 2018-09-07 DIAGNOSIS — I10 Essential (primary) hypertension: Secondary | ICD-10-CM | POA: Diagnosis not present

## 2018-09-07 DIAGNOSIS — E785 Hyperlipidemia, unspecified: Secondary | ICD-10-CM | POA: Diagnosis not present

## 2018-09-07 DIAGNOSIS — E1165 Type 2 diabetes mellitus with hyperglycemia: Secondary | ICD-10-CM | POA: Diagnosis not present

## 2018-09-07 DIAGNOSIS — M5416 Radiculopathy, lumbar region: Secondary | ICD-10-CM | POA: Diagnosis not present

## 2018-09-07 DIAGNOSIS — M2560 Stiffness of unspecified joint, not elsewhere classified: Secondary | ICD-10-CM | POA: Diagnosis not present

## 2018-09-07 DIAGNOSIS — M545 Low back pain: Secondary | ICD-10-CM | POA: Diagnosis not present

## 2018-09-21 DIAGNOSIS — Z23 Encounter for immunization: Secondary | ICD-10-CM | POA: Diagnosis not present

## 2018-10-12 DIAGNOSIS — K635 Polyp of colon: Secondary | ICD-10-CM | POA: Diagnosis not present

## 2018-10-12 DIAGNOSIS — D124 Benign neoplasm of descending colon: Secondary | ICD-10-CM | POA: Diagnosis not present

## 2018-10-12 DIAGNOSIS — D125 Benign neoplasm of sigmoid colon: Secondary | ICD-10-CM | POA: Diagnosis not present

## 2018-10-12 DIAGNOSIS — Z1211 Encounter for screening for malignant neoplasm of colon: Secondary | ICD-10-CM | POA: Diagnosis not present

## 2018-10-18 ENCOUNTER — Ambulatory Visit
Admission: RE | Admit: 2018-10-18 | Discharge: 2018-10-18 | Disposition: A | Discharge status: Home / Self Care | Payer: 59 | Source: Ambulatory Visit | Attending: Family Medicine | Admitting: Family Medicine

## 2018-10-18 DIAGNOSIS — Z1231 Encounter for screening mammogram for malignant neoplasm of breast: Secondary | ICD-10-CM | POA: Diagnosis not present

## 2019-02-13 ENCOUNTER — Encounter (HOSPITAL_COMMUNITY): Payer: Self-pay

## 2019-02-13 ENCOUNTER — Ambulatory Visit: Payer: 59 | Admitting: Family Medicine

## 2019-02-13 ENCOUNTER — Emergency Department (HOSPITAL_COMMUNITY): Payer: 59

## 2019-02-13 ENCOUNTER — Inpatient Hospital Stay (HOSPITAL_COMMUNITY)
Admission: EM | Admit: 2019-02-13 | Discharge: 2019-02-20 | DRG: 092 | Disposition: A | Discharge status: Home Health | Payer: 59 | Attending: Family Medicine | Admitting: Family Medicine

## 2019-02-13 ENCOUNTER — Other Ambulatory Visit: Payer: Self-pay

## 2019-02-13 ENCOUNTER — Telehealth: Payer: Self-pay | Admitting: Family Medicine

## 2019-02-13 DIAGNOSIS — E119 Type 2 diabetes mellitus without complications: Secondary | ICD-10-CM | POA: Diagnosis present

## 2019-02-13 DIAGNOSIS — E785 Hyperlipidemia, unspecified: Secondary | ICD-10-CM | POA: Diagnosis present

## 2019-02-13 DIAGNOSIS — G373 Acute transverse myelitis in demyelinating disease of central nervous system: Secondary | ICD-10-CM | POA: Diagnosis not present

## 2019-02-13 DIAGNOSIS — E282 Polycystic ovarian syndrome: Secondary | ICD-10-CM | POA: Diagnosis present

## 2019-02-13 DIAGNOSIS — Z87891 Personal history of nicotine dependence: Secondary | ICD-10-CM | POA: Diagnosis not present

## 2019-02-13 DIAGNOSIS — Z79899 Other long term (current) drug therapy: Secondary | ICD-10-CM

## 2019-02-13 DIAGNOSIS — M545 Low back pain, unspecified: Secondary | ICD-10-CM

## 2019-02-13 DIAGNOSIS — R9089 Other abnormal findings on diagnostic imaging of central nervous system: Secondary | ICD-10-CM | POA: Diagnosis not present

## 2019-02-13 DIAGNOSIS — Z8249 Family history of ischemic heart disease and other diseases of the circulatory system: Secondary | ICD-10-CM | POA: Diagnosis not present

## 2019-02-13 DIAGNOSIS — Z833 Family history of diabetes mellitus: Secondary | ICD-10-CM

## 2019-02-13 DIAGNOSIS — R55 Syncope and collapse: Secondary | ICD-10-CM | POA: Diagnosis present

## 2019-02-13 DIAGNOSIS — Z7952 Long term (current) use of systemic steroids: Secondary | ICD-10-CM

## 2019-02-13 DIAGNOSIS — G8929 Other chronic pain: Secondary | ICD-10-CM | POA: Diagnosis present

## 2019-02-13 DIAGNOSIS — R600 Localized edema: Secondary | ICD-10-CM | POA: Diagnosis present

## 2019-02-13 DIAGNOSIS — Z791 Long term (current) use of non-steroidal anti-inflammatories (NSAID): Secondary | ICD-10-CM | POA: Diagnosis not present

## 2019-02-13 DIAGNOSIS — I10 Essential (primary) hypertension: Secondary | ICD-10-CM | POA: Diagnosis present

## 2019-02-13 DIAGNOSIS — W19XXXA Unspecified fall, initial encounter: Secondary | ICD-10-CM | POA: Diagnosis present

## 2019-02-13 DIAGNOSIS — Z79891 Long term (current) use of opiate analgesic: Secondary | ICD-10-CM | POA: Diagnosis not present

## 2019-02-13 DIAGNOSIS — Z6841 Body Mass Index (BMI) 40.0 and over, adult: Secondary | ICD-10-CM | POA: Diagnosis not present

## 2019-02-13 DIAGNOSIS — M48061 Spinal stenosis, lumbar region without neurogenic claudication: Secondary | ICD-10-CM | POA: Diagnosis present

## 2019-02-13 DIAGNOSIS — R200 Anesthesia of skin: Secondary | ICD-10-CM | POA: Diagnosis present

## 2019-02-13 DIAGNOSIS — G9511 Acute infarction of spinal cord (embolic) (nonembolic): Secondary | ICD-10-CM | POA: Diagnosis present

## 2019-02-13 DIAGNOSIS — M5136 Other intervertebral disc degeneration, lumbar region: Secondary | ICD-10-CM | POA: Diagnosis present

## 2019-02-13 DIAGNOSIS — F40240 Claustrophobia: Secondary | ICD-10-CM | POA: Diagnosis present

## 2019-02-13 DIAGNOSIS — R609 Edema, unspecified: Secondary | ICD-10-CM | POA: Diagnosis not present

## 2019-02-13 DIAGNOSIS — M1990 Unspecified osteoarthritis, unspecified site: Secondary | ICD-10-CM | POA: Diagnosis present

## 2019-02-13 DIAGNOSIS — E559 Vitamin D deficiency, unspecified: Secondary | ICD-10-CM | POA: Diagnosis present

## 2019-02-13 DIAGNOSIS — K5900 Constipation, unspecified: Secondary | ICD-10-CM | POA: Diagnosis present

## 2019-02-13 DIAGNOSIS — Z96643 Presence of artificial hip joint, bilateral: Secondary | ICD-10-CM | POA: Diagnosis present

## 2019-02-13 DIAGNOSIS — Z1159 Encounter for screening for other viral diseases: Secondary | ICD-10-CM | POA: Diagnosis not present

## 2019-02-13 DIAGNOSIS — G834 Cauda equina syndrome: Secondary | ICD-10-CM | POA: Insufficient documentation

## 2019-02-13 DIAGNOSIS — Z794 Long term (current) use of insulin: Secondary | ICD-10-CM | POA: Diagnosis not present

## 2019-02-13 LAB — COMPREHENSIVE METABOLIC PANEL
ALT: 18 U/L (ref 0–44)
AST: 15 U/L (ref 15–41)
Albumin: 3.9 g/dL (ref 3.5–5.0)
Alkaline Phosphatase: 67 U/L (ref 38–126)
Anion gap: 8 (ref 5–15)
BUN: 14 mg/dL (ref 6–20)
CO2: 26 mmol/L (ref 22–32)
Calcium: 9.7 mg/dL (ref 8.9–10.3)
Chloride: 107 mmol/L (ref 98–111)
Creatinine, Ser: 0.71 mg/dL (ref 0.44–1.00)
GFR calc Af Amer: >60 mL/min (ref >60)
GFR calc non Af Amer: >60 mL/min (ref >60)
Glucose, Bld: 198 mg/dL — ABNORMAL HIGH (ref 70–99)
Potassium: 3.6 mmol/L (ref 3.5–5.1)
Sodium: 141 mmol/L (ref 135–145)
Total Bilirubin: 0.4 mg/dL (ref 0.3–1.2)
Total Protein: 7.4 g/dL (ref 6.5–8.1)

## 2019-02-13 LAB — CBC WITH DIFFERENTIAL/PLATELET
Abs Immature Granulocytes: 0.02 K/uL (ref 0.00–0.07)
Basophils Absolute: 0 K/uL (ref 0.0–0.1)
Basophils Relative: 0 %
Eosinophils Absolute: 0 K/uL (ref 0.0–0.5)
Eosinophils Relative: 0 %
HCT: 40.6 % (ref 36.0–46.0)
Hemoglobin: 12.8 g/dL (ref 12.0–15.0)
Immature Granulocytes: 0 %
Lymphocytes Relative: 14 %
Lymphs Abs: 1.1 K/uL (ref 0.7–4.0)
MCH: 27.2 pg (ref 26.0–34.0)
MCHC: 31.5 g/dL (ref 30.0–36.0)
MCV: 86.2 fL (ref 80.0–100.0)
Monocytes Absolute: 0.5 K/uL (ref 0.1–1.0)
Monocytes Relative: 7 %
Neutro Abs: 5.8 K/uL (ref 1.7–7.7)
Neutrophils Relative %: 79 %
Platelets: 232 K/uL (ref 150–400)
RBC: 4.71 MIL/uL (ref 3.87–5.11)
RDW: 14.9 % (ref 11.5–15.5)
WBC: 7.5 K/uL (ref 4.0–10.5)
nRBC: 0 % (ref 0.0–0.2)

## 2019-02-13 LAB — SARS CORONAVIRUS 2 BY RT PCR (HOSPITAL ORDER, PERFORMED IN ~~LOC~~ HOSPITAL LAB): SARS Coronavirus 2: NEGATIVE

## 2019-02-13 LAB — SEDIMENTATION RATE: Sed Rate: 32 mm/hr — ABNORMAL HIGH (ref 0–22)

## 2019-02-13 MED ORDER — LORAZEPAM 2 MG/ML IJ SOLN: 0.5000 mg | Freq: Once | INTRAMUSCULAR | Status: DC

## 2019-02-13 NOTE — ED Notes (Signed)
ED Provider at bedside. 

## 2019-02-13 NOTE — ED Notes (Signed)
Patient transported to MRI 

## 2019-02-13 NOTE — Telephone Encounter (Signed)
With concern for cauda equina syndrome, patient was instructed to go directly to Emergency Department.  She was directed that shemust have someone drive her there or call 911 to be transported.

## 2019-02-13 NOTE — Telephone Encounter (Signed)
-----   Message from Renae Fickle sent at 02/13/2019 11:28 AM EDT ----- Regarding: F/U Appt She has scheduled a follow up appointment for this afternoon. She said she has lost complete feeling from her lower back down to her toes. She said she can't stand up and walk and has lost control of her bowels.   Just wanted to check if she should be following up here or if you suggest she see another specialist.

## 2019-02-13 NOTE — ED Triage Notes (Addendum)
Patient fell Friday at work due to weakness in her legs.   Patient states on Saturday when she woke up she had numbness on both buttocks radiating down both legs but, worse on left side.   C/O lower back pain  10/10 pain   Patient states her neck is bruised from falling.  Patient states she LOC and woke up when first responders were there.  Denies hitting head.    Last BM Friday morning.  Patient states she has no control of her bladder since falling.      A/ox4 Wheelchair in triage.

## 2019-02-13 NOTE — ED Notes (Addendum)
Patient expressing concern of anxiety and claustrophobia during additional MRI. Made aware of ordered Ativan for MRI. MD made aware. MD at bedside speaking with patient at this time.

## 2019-02-13 NOTE — ED Provider Notes (Signed)
[facility] DEPT Provider Note   CSN: [id number] Arrival date & time: 02/13/19  1416     History   Chief Complaint Chief Complaint  Patient presents with   Fall   Numbness    legs   Loss of Consciousness    HPI Laura Blackwell is a 63 y.o. female.     Patient states that 3 days ago she got up and her legs gave out and she fell to the floor.  She has been in the bed ever since then she complains of losing control of her bladder and numbness around her anus  The history is provided by the patient. No language interpreter was used.  Fall This is a new problem. The current episode started more than 2 days ago. The problem occurs rarely. The problem has not changed since onset.Pertinent negatives include no chest pain, no abdominal pain and no headaches. Nothing aggravates the symptoms. Nothing relieves the symptoms. She has tried nothing for the symptoms.  Loss of Consciousness Associated symptoms: no chest pain, no headaches and no seizures     Past Medical History:  Diagnosis Date   Arthritis    Diabetes mellitus without complication (HCC)    Edema    in left leg in 1980s on left ankle    Hypertension    PCOS (polycystic ovarian syndrome)    Swelling    Vitamin D deficiency     Patient Active Problem List   Diagnosis Date Noted   Class 3 severe obesity with serious comorbidity and body mass index (BMI) of 45.0 to 49.9 in adult ([place]) 10/04/2017   Class 3 severe obesity without serious comorbidity with body mass index (BMI) of 45.0 to 49.9 in adult ([place]) 04/12/2017   Vitamin D deficiency 04/12/2017   Other fatigue 12/31/2016   SOB (shortness of breath) on exertion 12/31/2016   Type 2 diabetes mellitus without complication, without long-term current use of insulin ([place]) 12/31/2016   Morbid obesity ([place]) 12/31/2016   OA (osteoarthritis) of hip 12/02/2015    Past Surgical History:  Procedure Laterality Date    BREAST BIOPSY Right    JOINT REPLACEMENT  2011    right hip    left ankle surgery      TOTAL HIP ARTHROPLASTY Left 12/02/2015   Procedure: LEFT TOTAL HIP ARTHROPLASTY ANTERIOR APPROACH;  Surgeon: [name], MD;  Location: [facility];  Service: Orthopedics;  Laterality: Left;     OB History    Gravida  2   Para  2   Term  2   Preterm      AB      Living  2     SAB      TAB      Ectopic      Multiple      Live Births               Home Medications    Prior to Admission medications   Medication Sig Start Date End Date Taking? Authorizing Provider  amLODipine (NORVASC) 5 MG tablet Take 5 mg by mouth daily.  08/30/15   [provider]  atorvastatin (LIPITOR) 20 MG tablet Take 20 mg by mouth daily.  06/21/15   [provider]  diclofenac (VOLTAREN) 75 MG EC tablet Take 1 tablet (75 mg total) by mouth 2 (two) times daily. 08/03/18   Hudnall, Sharyn Lull, MD  furosemide (LASIX) 20 MG tablet Take 20 mg by mouth 2 (two) times daily.  06/21/15   [provider]  gabapentin (NEURONTIN) 300 MG capsule Take 1 capsule (300 mg total) by mouth 3 (three) times daily. 08/03/18   Hudnall, Sharyn Lull, MD  glimepiride (AMARYL) 2 MG tablet glimepiride 2 mg tablet    [provider]  glucose blood (ONETOUCH VERIO) test strip 1 each by Other route 2 (two) times daily. Use as instructed    [provider]  HYDROcodone-acetaminophen (NORCO) 5-325 MG tablet Take 1 tablet by mouth every 4 (four) hours as needed for moderate pain. 08/03/18   Hudnall, Sharyn Lull, MD  ibuprofen (ADVIL,MOTRIN) 800 MG tablet Take 1 tablet (800 mg total) by mouth every 8 (eight) hours as needed. 08/02/18   Hudnall, Sharyn Lull, MD  Insulin Pen Needle (BD PEN NEEDLE NANO U/F) 32G X 4 MM MISC 1 each by Does not apply route daily. 09/09/17   Lacy Duverney M, PA-C  INVOKANA 100 MG TABS tablet Take 100 mg by mouth daily. 04/23/18   [provider]  liraglutide (VICTOZA) 18 MG/3ML SOPN  Inject 0.2 mLs (1.2 mg total) into the skin every morning. 09/09/17   Waldon Merl, PA-C  losartan (COZAAR) 100 MG tablet Take 100 mg by mouth every morning.  08/30/15   [provider]  metFORMIN (GLUCOPHAGE) 500 MG tablet Take 1,000 mg by mouth 2 (two) times daily with a meal.  08/30/15   [provider]  methocarbamol (ROBAXIN) 500 MG tablet methocarbamol 500 mg tablet  Take 1 tablet 3 times a day by oral route as needed for 20 days.    [provider]  predniSONE (DELTASONE) 10 MG tablet 6 tabs po day 1, 5 tabs po day 2, 4 tabs po day 3, 3 tabs po day 4, 2 tabs po day 5, 1 tab po day 6 07/25/18   Hudnall, Sharyn Lull, MD  SOLIQUA 100-33 UNT-MCG/ML SOPN INJECT 32 UNITS PRIOR TO BREAKFAST EVERY DAY 07/13/18   [provider]  tiZANidine (ZANAFLEX) 4 MG tablet Take 1 tablet (4 mg total) by mouth every 8 (eight) hours as needed. 07/26/18   Dene Gentry, MD  Vitamin D, Ergocalciferol, (DRISDOL) 50000 units CAPS capsule Take 1 capsule (50,000 Units total) by mouth every 7 (seven) days. 09/09/17   Waldon Merl, PA-C    Family History Family History  Problem Relation Age of Onset   Diabetes Mother    Hypertension Mother     Social History Social History   Tobacco Use   Smoking status: Former Smoker    Quit date: 10/01/2002    Years since quitting: 16.3   Smokeless tobacco: Never Used  Substance Use Topics   Alcohol use: No   Drug use: No     Allergies   Patient has no known allergies.   Review of Systems Review of Systems  Constitutional: Negative for appetite change and fatigue.  HENT: Negative for congestion, ear discharge and sinus pressure.   Eyes: Negative for discharge.  Respiratory: Negative for cough.   Cardiovascular: Positive for syncope. Negative for chest pain.  Gastrointestinal: Negative for abdominal pain and diarrhea.  Genitourinary: Negative for frequency and hematuria.       Is uncontrolled bladder  Musculoskeletal:  Negative for back pain.  Skin: Negative for rash.  Neurological: Negative for seizures and headaches.       Weakness in legs worse on the right  Psychiatric/Behavioral: Negative for hallucinations.     Physical Exam Updated Vital Signs BP 137/69    Pulse  87    Temp 98.8 F (37.1 C) (Oral)    Resp 16    SpO2 99%   Physical Exam Vitals signs and nursing note reviewed.  Constitutional:      Appearance: She is well-developed.  HENT:     Head: Normocephalic.     Nose: Nose normal.  Eyes:     General: No scleral icterus.    Conjunctiva/sclera: Conjunctivae normal.  Neck:     Musculoskeletal: Neck supple.     Thyroid: No thyromegaly.  Cardiovascular:     Rate and Rhythm: Normal rate and regular rhythm.     Heart sounds: No murmur. No friction rub. No gallop.   Pulmonary:     Breath sounds: No stridor. No wheezing or rales.  Chest:     Chest wall: No tenderness.  Abdominal:     General: There is no distension.     Tenderness: There is no abdominal tenderness. There is no rebound.  Genitourinary:    Comments: She has poor tone in her rectum and decreased sensation around her rectum. Musculoskeletal:     Comments: Patient has no patellar tendon reflex on either leg.  Worsening weakness on the right side.  Lymphadenopathy:     Cervical: No cervical adenopathy.  Skin:    General: Skin is warm.     Findings: No erythema or rash.  Neurological:     Mental Status: She is oriented to person, place, and time.     Motor: No abnormal muscle tone.     Coordination: Coordination normal.  Psychiatric:        Behavior: Behavior normal.      ED Treatments / Results  Labs (all labs ordered are listed, but only abnormal results are displayed) Labs Reviewed  COMPREHENSIVE METABOLIC PANEL - Abnormal; Notable for the following components:      Result Value   Glucose, Bld 198 (*)    All other components within normal limits  NOVEL CORONAVIRUS, NAA (HOSPITAL ORDER, SEND-OUT TO REF  LAB)  SARS CORONAVIRUS 2 (HOSPITAL ORDER, [place] [facility])  CBC WITH DIFFERENTIAL/PLATELET  SEDIMENTATION RATE  C-REACTIVE PROTEIN    EKG    Radiology Mr Lumbar Spine Wo Contrast  Result Date: 02/13/2019 CLINICAL DATA:  Back pain.  Cauda equina syndrome suspected. EXAM: MRI LUMBAR SPINE WITHOUT CONTRAST TECHNIQUE: Multiplanar, multisequence MR imaging of the lumbar spine was performed. No intravenous contrast was administered. COMPARISON:  None. FINDINGS: Segmentation:  Normal Alignment: Mild retrolisthesis L1-2, L2-3, L3-4. 5 mm anterolisthesis L4-5. Vertebrae:  Negative for fracture or mass. Conus medullaris and cauda equina: Conus extends to the L1-2 level. Ill-defined hyperintense signal throughout the conus medullaris without significant expansion. Axial images not obtained through this level. Paraspinal and other soft tissues: Negative for paraspinous mass or adenopathy. Abdominal aorta normal in caliber Disc levels: L1-2: Mild disc degeneration without stenosis. Shallow left paracentral disc protrusion. L3-4: Mild disc bulging and facet degeneration. Negative for stenosis L3-4: Mild disc and facet degeneration.  Negative for stenosis L4-5: 5 mm anterolisthesis. Severe facet degeneration bilaterally. Moderately severe spinal stenosis and moderate subarticular stenosis bilaterally. L5-S1: Mild facet degeneration bilaterally.  Negative for stenosis. IMPRESSION: 1. Conus medullaris is abnormal with diffuse hyperintense signal. Limited evaluation on this study and further evaluation recommended. Differential diagnosis includes ischemia, demyelinating disease, and neoplasm. Recommend follow-up lumbar MRI with contrast and thoracic MRI without and with contrast for further evaluation. 2. Multilevel degenerative changes in the lumbar spine most severe at L4-5 where  there is moderate spinal stenosis. 3. These results were called by telephone at the time of interpretation on 02/13/2019  at 6:30 pm to Dr. Milton Ferguson , who verbally acknowledged these results. Electronically Signed   By: Franchot Gallo M.D.   On: 02/13/2019 18:30    Procedures Procedures (including critical care time)  Medications Ordered in ED Medications  LORazepam (ATIVAN) injection 0.5 mg (0.5 mg Intravenous Not Given 02/13/19 2023)     Initial Impression / Assessment and Plan / ED Course  I have reviewed the triage vital signs and the nursing notes.  Pertinent labs & imaging results that were available during my care of the patient were reviewed by me and considered in my medical decision making (see chart for details).        MRI lumbar spine shows some type of lesion that the radiologist recommended an MRI with contrast of the thoracic and lumbar area.  Patient states she is too claustrophobic.  Patient will be admitted to [facility] and get an open MRI tomorrow and be sedated appropriately.  Neurosurgery has been notified about the patient.  They did not recommend any treatment until the MRIs done.  Hospitalist will admit the patient  Final Clinical Impressions(s) / ED Diagnoses   Final diagnoses:  Fall, initial encounter    ED Discharge Orders    None       Milton Ferguson, MD 02/13/19 2031

## 2019-02-13 NOTE — ED Notes (Signed)
[facility] called for transport. 

## 2019-02-13 NOTE — H&P (Addendum)
Laura Blackwell KDX:[id number] DOB: 10-19-1955 DOA: 02/13/2019     PCP: Carol Ada, MD   Outpatient Specialists:     Dene Gentry, MD Sports Medicine Patient arrived to ER on 02/13/19 at 1416  Patient coming from: home Lives  With family    Chief Complaint:  Chief Complaint  Patient presents with   Fall   Numbness    legs   Loss of Consciousness    HPI: Laura Blackwell is a 63 y.o. female with medical history significant of, diabetes mellitus, HTN, PCOS, vitamin D deficiency, sp right hip replacement    Presented with  Lower ext weakness Had a fall on Friday 4 days ago at work due to significant weakness in her lower extremities but time she woke up on Saturday she had numbness on both buttocks and down to the back of her legs worse on the left.  She has been having significant low back pain. Denies hitting her head. On Saturday she stood up and took 2 steps and fell flat She must have been unconscious for a while bc when she woke up  EMS was there.  A feeling from her low back down to her toes cannot stand up or walk has lost control of her urine and bladder as well as bowels Reported to her sports medicine provider who recommended for her to call 911   Record she has had low back pain since December  2019 with intermittent episodes of shooting pain down her leg and numbness Orthopedics and was told it is not related to her hips.  She try to use Robaxin.  On point outpatient provider attempted to obtain MRI But it did not happen patient felt she could tolerate it.  She have had some back injections in the beginning of the year and used physical therapy and she improved. She started to exercises including squats and started to have significant pain in her buttocks.    And Aleve in the past to help her with the pain  Infectious risk factors:  Reports work at [employer] [employer] ER In  [place] TEST  Pending    Regarding pertinent Chronic problems:     Hyperlipidemia -  on statins lipitor   HTN on Norvasc, cozaar  Lasix    DM 2 -  Lab Results  Component Value Date   HGBA1C 7.3 (H) 05/04/2017     Soliqua 32 units  Amaryl,   Metformin    Morbid obesity-   BMI Readings from Last 1 Encounters:  08/03/18 41.35 kg/m     While in ER: MRI lumbar spine was attempted patient did not tolerate full study secondary to claustrophobia spite order of Ativan The following Work up has been ordered so far:  Orders Placed This Encounter  Procedures   SARS Coronavirus 2 (CEPHEID - Performed in [place] hospital lab), [place] 1 VIEW   CBC with Differential/Platelet   Comprehensive metabolic panel   Sedimentation rate   C-reactive protein   Troponin I -   NPO Except for Meds   Saline Lock IV, Maintain IV access   Cardiac monitoring   Neuro checks   Ice chips   Consult to hospitalist  ALL PATIENTS BEING ADMITTED/HAVING PROCEDURES NEED COVID-19 SCREENING   Consult to neurosurgery  ALL PATIENTS BEING ADMITTED/HAVING PROCEDURES NEED COVID-19  SCREENING   Droplet precaution   EKG 12-Lead   EKG 12-Lead   Saline lock IV   Admit to Inpatient (patient's expected length of stay will be greater than 2 midnights or inpatient only procedure)    Following Medications were ordered in ER: Medications  LORazepam (ATIVAN) injection 0.5 mg (0.5 mg Intravenous Not Given 02/13/19 2023)        Consult Orders  (From admission, onward)         Start     Ordered   02/13/19 1924  Consult to hospitalist  Dr. Roel Cluck  Once    Comments: ALL PATIENTS BEING ADMITTED/HAVING PROCEDURES NEED COVID-19 SCREENING  Provider:  (Not yet assigned)  Question Answer Comment  Place call to: [facility],  call [phone]   Reason for Consult Admit      02/13/19 1923         ER Provider Called:  Neurosurgery     Dr.Thomas  Ostergard  They Recommend admit to medicine repeat MRI with Contrast at [facility] Will see in AM once results of MRI are available  Significant initial  Findings: Abnormal Labs Reviewed  COMPREHENSIVE METABOLIC PANEL - Abnormal; Notable for the following components:      Result Value   Glucose, Bld 198 (*)    All other components within normal limits    Otherwise labs showing:    Recent Labs  Lab 02/13/19 1555  NA 141  K 3.6  CO2 26  GLUCOSE 198*  BUN 14  CREATININE 0.71  CALCIUM 9.7    Cr   Stable,  Lab Results  Component Value Date   CREATININE 0.71 02/13/2019   CREATININE 0.60 05/04/2017   CREATININE 0.62 12/31/2016    Recent Labs  Lab 02/13/19 1555  AST 15  ALT 18  ALKPHOS 67  BILITOT 0.4  PROT 7.4  ALBUMIN 3.9   Lab Results  Component Value Date   CALCIUM 9.7 02/13/2019    WBC       Component Value Date/Time   WBC 7.5 02/13/2019 1555   ANC    Component Value Date/Time   NEUTROABS 5.8 02/13/2019 1555   NEUTROABS 4.4 12/31/2016 1032   ALC No results found for: LYMPHOABS    Plt: Lab Results  Component Value Date   PLT 232 02/13/2019      COVID-19 Labs   No results found for: SARSCOV2NAA    HG/HCT  stable,       Component Value Date/Time   HGB 12.8 02/13/2019 1555   HGB 13.3 12/31/2016 1032   HCT 40.6 02/13/2019 1555   HCT 40.2 12/31/2016 1032     UA not ordered    MR thoracic spine with/o contrast -  Conus medullaris is abnormal with diffuse hyperintense signal  ECG:  Personally reviewed by me showing: HR : 80 Rhythm: NSR   no evidence of ischemic changes [id number]      ED Triage Vitals  Enc Vitals Group     BP 02/13/19 1432 (!) 149/86     Pulse Rate 02/13/19 1432 (!) 110     Resp 02/13/19 1432 18     Temp 02/13/19 1432 98.8 F (37.1 C)     Temp Source 02/13/19 1432 Oral     SpO2 02/13/19 1432 98 %     Weight --      Height --      Head Circumference --      Peak Flow --      Pain Score 02/13/19  1437 10     Pain  Loc --      Pain Edu? --      Excl. in [place]? --   TMAX(24)@       Latest  Blood pressure 125/73, pulse 99, temperature 98.8 F (37.1 C), temperature source Oral, resp. rate 18, SpO2 100 %.    Hospitalist was called for admission for New Lower extremity weakness   Review of Systems:    Pertinent positives include: lower extremities   Constitutional:  No weight loss, night sweats, Fevers, chills, fatigue, weight loss  HEENT:  No headaches, Difficulty swallowing,Tooth/dental problems,Sore throat,  No sneezing, itching, ear ache, nasal congestion, post nasal drip,  Cardio-vascular:  No chest pain, Orthopnea, PND, anasarca, dizziness, palpitations.no Bilateral lower extremity swelling  GI:  No heartburn, indigestion, abdominal pain, nausea, vomiting, diarrhea, change in bowel habits, loss of appetite, melena, blood in stool, hematemesis Resp:  no shortness of breath at rest. No dyspnea on exertion, No excess mucus, no productive cough, No non-productive cough, No coughing up of blood.No change in color of mucus.No wheezing. Skin:  no rash or lesions. No jaundice GU:  no dysuria, change in color of urine, no urgency or frequency. No straining to urinate.  No flank pain.  Musculoskeletal:  No joint pain or no joint swelling. No decreased range of motion. No back pain.  Psych:  No change in mood or affect. No depression or anxiety. No memory loss.  Neuro: no localizing neurological complaints, no tingling, no weakness, no double vision, no gait abnormality, no slurred speech, no confusion  All systems reviewed and apart from [place] all are negative  Past Medical History:   Past Medical History:  Diagnosis Date   Arthritis    Diabetes mellitus without complication (HCC)    Edema    in left leg in 1980s on left ankle    Hypertension    PCOS (polycystic ovarian syndrome)    Swelling    Vitamin D deficiency        Past Surgical History:  Procedure Laterality Date    BREAST BIOPSY Right    JOINT REPLACEMENT  2011    right hip    left ankle surgery      TOTAL HIP ARTHROPLASTY Left 12/02/2015   Procedure: LEFT TOTAL HIP ARTHROPLASTY ANTERIOR APPROACH;  Surgeon: [name], MD;  Location: [facility]S;  Service: Orthopedics;  Laterality: Left;    Social History:  Ambulatory  Independently     reports that she quit smoking about 16 years ago. She has never used smokeless tobacco. She reports that she does not drink alcohol or use drugs.     Family History:   Family History  Problem Relation Age of Onset   Diabetes Mother    Hypertension Mother     Allergies: No Known Allergies   Prior to Admission medications   Medication Sig Start Date End Date Taking? Authorizing Provider  amLODipine (NORVASC) 5 MG tablet Take 5 mg by mouth daily.  08/30/15   [provider]  atorvastatin (LIPITOR) 20 MG tablet Take 20 mg by mouth daily.  06/21/15   [provider]  diclofenac (VOLTAREN) 75 MG EC tablet Take 1 tablet (75 mg total) by mouth 2 (two) times daily. 08/03/18   Hudnall, Sharyn Lull, MD  furosemide (LASIX) 20 MG tablet Take 20 mg by mouth 2 (two) times daily.  06/21/15   [provider]  gabapentin (NEURONTIN) 300 MG capsule Take 1 capsule (300 mg total)  by mouth 3 (three) times daily. 08/03/18   Hudnall, Sharyn Lull, MD  glimepiride (AMARYL) 2 MG tablet glimepiride 2 mg tablet    [provider]  glucose blood (ONETOUCH VERIO) test strip 1 each by Other route 2 (two) times daily. Use as instructed    [provider]  HYDROcodone-acetaminophen (NORCO) 5-325 MG tablet Take 1 tablet by mouth every 4 (four) hours as needed for moderate pain. 08/03/18   Hudnall, Sharyn Lull, MD  ibuprofen (ADVIL,MOTRIN) 800 MG tablet Take 1 tablet (800 mg total) by mouth every 8 (eight) hours as needed. 08/02/18   Hudnall, Sharyn Lull, MD  Insulin Pen Needle (BD PEN NEEDLE NANO U/F) 32G X 4 MM MISC 1 each by Does not apply route daily. 09/09/17    Lacy Duverney M, PA-C  INVOKANA 100 MG TABS tablet Take 100 mg by mouth daily. 04/23/18   [provider]  liraglutide (VICTOZA) 18 MG/3ML SOPN Inject 0.2 mLs (1.2 mg total) into the skin every morning. 09/09/17   Waldon Merl, PA-C  losartan (COZAAR) 100 MG tablet Take 100 mg by mouth every morning.  08/30/15   [provider]  metFORMIN (GLUCOPHAGE) 500 MG tablet Take 1,000 mg by mouth 2 (two) times daily with a meal.  08/30/15   [provider]  methocarbamol (ROBAXIN) 500 MG tablet methocarbamol 500 mg tablet  Take 1 tablet 3 times a day by oral route as needed for 20 days.    [provider]  predniSONE (DELTASONE) 10 MG tablet 6 tabs po day 1, 5 tabs po day 2, 4 tabs po day 3, 3 tabs po day 4, 2 tabs po day 5, 1 tab po day 6 07/25/18   Hudnall, Sharyn Lull, MD  SOLIQUA 100-33 UNT-MCG/ML SOPN INJECT 32 UNITS PRIOR TO BREAKFAST EVERY DAY 07/13/18   [provider]  tiZANidine (ZANAFLEX) 4 MG tablet Take 1 tablet (4 mg total) by mouth every 8 (eight) hours as needed. 07/26/18   Dene Gentry, MD  Vitamin D, Ergocalciferol, (DRISDOL) 50000 units CAPS capsule Take 1 capsule (50,000 Units total) by mouth every 7 (seven) days. 09/09/17   Waldon Merl, PA-C   Physical Exam: Blood pressure 125/73, pulse 99, temperature 98.8 F (37.1 C), temperature source Oral, resp. rate 18, SpO2 100 %. 1. General:  in No  Acute distress   Chronically ill   -appearing 2. Psychological: Alert and   Oriented 3. Head/ENT:   Moist  Mucous Membranes                          Head Non traumatic, neck supple                            Poor Dentition 4. SKIN:   decreased Skin turgor,  Skin clean Dry and intact no rash 5. Heart: Regular rate and rhythm no Murmur, no Rub or gallop 6. Lungs:  Clear to auscultation bilaterally, no wheezes or crackles   7. Abdomen: Soft,  non-tender, Non distended   Obese bowel sounds present 8. Lower extremities: no clubbing, cyanosis,   Edema  L>R patient states this is chronic but gotten worse  9. Neurologically  ranial nerves II through XII intact decreased sensation and weakness of lower extremity bilaterally but worse on the left, slightly diminished sensation  10. MSK: Normal range of motion   All other LABS:  Recent Labs  Lab 02/13/19 1555  WBC 7.5  NEUTROABS 5.8  HGB 12.8  HCT 40.6  MCV 86.2  PLT 232     Recent Labs  Lab 02/13/19 1555  NA 141  K 3.6  CL 107  CO2 26  GLUCOSE 198*  BUN 14  CREATININE 0.71  CALCIUM 9.7     Recent Labs  Lab 02/13/19 1555  AST 15  ALT 18  ALKPHOS 67  BILITOT 0.4  PROT 7.4  ALBUMIN 3.9       Cultures: No results found for: SDES, SPECREQUEST, CULT, REPTSTATUS   Radiological Exams on Admission: Mr Lumbar Spine Wo Contrast  Result Date: 02/13/2019 CLINICAL DATA:  Back pain.  Cauda equina syndrome suspected. EXAM: MRI LUMBAR SPINE WITHOUT CONTRAST TECHNIQUE: Multiplanar, multisequence MR imaging of the lumbar spine was performed. No intravenous contrast was administered. COMPARISON:  None. FINDINGS: Segmentation:  Normal Alignment: Mild retrolisthesis L1-2, L2-3, L3-4. 5 mm anterolisthesis L4-5. Vertebrae:  Negative for fracture or mass. Conus medullaris and cauda equina: Conus extends to the L1-2 level. Ill-defined hyperintense signal throughout the conus medullaris without significant expansion. Axial images not obtained through this level. Paraspinal and other soft tissues: Negative for paraspinous mass or adenopathy. Abdominal aorta normal in caliber Disc levels: L1-2: Mild disc degeneration without stenosis. Shallow left paracentral disc protrusion. L3-4: Mild disc bulging and facet degeneration. Negative for stenosis L3-4: Mild disc and facet degeneration.  Negative for stenosis L4-5: 5 mm anterolisthesis. Severe facet degeneration bilaterally. Moderately severe spinal stenosis and moderate subarticular stenosis bilaterally. L5-S1: Mild facet degeneration  bilaterally.  Negative for stenosis. IMPRESSION: 1. Conus medullaris is abnormal with diffuse hyperintense signal. Limited evaluation on this study and further evaluation recommended. Differential diagnosis includes ischemia, demyelinating disease, and neoplasm. Recommend follow-up lumbar MRI with contrast and thoracic MRI without and with contrast for further evaluation. 2. Multilevel degenerative changes in the lumbar spine most severe at L4-5 where there is moderate spinal stenosis. 3. These results were called by telephone at the time of interpretation on 02/13/2019 at 6:30 pm to Dr. Milton Ferguson , who verbally acknowledged these results. Electronically Signed   By: Franchot Gallo M.D.   On: 02/13/2019 18:30   Dg Chest Port 1 View  Result Date: 02/13/2019 CLINICAL DATA:  Abnormal imaging of central nervous system. Cauda equina syndrome suspected. Fall. Loss of consciousness. EXAM: PORTABLE CHEST 1 VIEW COMPARISON:  Radiograph 04/21/2010 FINDINGS: Upper normal heart size. Normal mediastinal contours. The lungs are clear. Slightly vision of right hemidiaphragm, unchanged from prior. Pulmonary vasculature is normal. No consolidation, pleural effusion, or pneumothorax. No acute osseous abnormalities are seen. Degenerative change of the right shoulder. IMPRESSION: 1. Borderline cardiomegaly. 2.  No acute pulmonary process. Electronically Signed   By: Keith Rake M.D.   On: 02/13/2019 20:54    Chart has been reviewed    Assessment/Plan   63 y.o. female with medical history significant of, diabetes mellitus, HTN, PCOS, vitamin D deficiency, sp right hip replacement Admitted for cauda equina syndrome   Present on Admission: possible  Cauda equina syndrome ([place]) -transfer to [facility] in attempt to obtain an open MRI tomorrow or reattempt with sedation Spoke to Radiology Md and MRI tech at [facility] [phone] as patient could not tolerate MRI at [facility] long Neuro checks ordered  Neurosurgery  is aware and to hold off on antibiotics or steroids until there is a clear picture of etiology of the lesion.  Continue to monitor carefully   Morbid  obesity ([place]) chronic stable we will need nutritional consult as an outpatient  Syncope -occurred more than 24 hours ago will observe on telemetry to see if there is any arrhythmias.  Check troponin and obtain echo  Essential hypertension -resume home medications when able to tolerate  DM 2 -  - Order Sensitive SSI    -  check TSH and HgA1C  - Hold by mouth medications   Leg edema on the left - pt states this is since surgery in 1980 but has gotten worse will obtain doppler since seemed to be worse lately   Other plan as per orders.  DVT prophylaxis:  SCD     Code Status:  FULL CODE  as per patient   I had personally discussed CODE STATUS with patient    Family Communication:   Family not at  Bedside    Disposition Plan:     likely will need placement for rehabilitation                                         Would benefit from PT/OT eval prior to DC                        Consults called:  Neurosurgery is aware   Admission status:  ED Disposition    ED Disposition Condition [place]: [place] [[id number]]  Level of Care: Telemetry Medical [104]  Covid Evaluation: N/A  Diagnosis: Cauda equina syndrome ([place]) [[id number]]  Admitting Physician: Toy Baker [[id number]]  Attending Physician: Toy Baker [[id number]]  Estimated length of stay: 3 - 4 days  Certification:: I certify this patient will need inpatient services for at least 2 midnights  PT Class (Do Not Modify): Inpatient [101]  PT Acc Code (Do Not Modify): Private [1]        inpatient     Expect 2 midnight stay secondary to severity of patient's current illness including   hemodynamic instability despite optimal treatment (tachycardia  )  Severe lab/radiological/exam abnormalities including: Conus medullaris lesion   and  extensive comorbidities including:  DM2    That are currently affecting medical management.   I expect  patient to be hospitalized for 2 midnights requiring inpatient medical care.  Patient is at high risk for adverse outcome (such as loss of life or disability) if not treated.  Indication for inpatient stay as follows:  Severe change from baseline regarding neurological status     severe pain requiring acute inpatient management,  inability to maintain oral hydration PT must be NPO for possible proceedure   Possible Need for operative/procedural  intervention New or worsening hypoxia      Level of care    tele  For  24H        Precautions:  Droplet,  Droplet precaution  PPE: Used by the provider:   P100  eye Goggles,  Gloves        Aaric Dolph 02/13/2019, 9:46 PM     [facility]     after 2 AM please page floor coverage PA If 7AM-7PM, please contact the day team taking care of the patient using [email or web address]

## 2019-02-14 ENCOUNTER — Inpatient Hospital Stay (HOSPITAL_COMMUNITY): Payer: 59

## 2019-02-14 ENCOUNTER — Ambulatory Visit: Payer: 59 | Admitting: Family Medicine

## 2019-02-14 DIAGNOSIS — R609 Edema, unspecified: Secondary | ICD-10-CM

## 2019-02-14 DIAGNOSIS — G373 Acute transverse myelitis in demyelinating disease of central nervous system: Secondary | ICD-10-CM

## 2019-02-14 DIAGNOSIS — I10 Essential (primary) hypertension: Secondary | ICD-10-CM

## 2019-02-14 DIAGNOSIS — E119 Type 2 diabetes mellitus without complications: Secondary | ICD-10-CM

## 2019-02-14 DIAGNOSIS — R55 Syncope and collapse: Secondary | ICD-10-CM

## 2019-02-14 LAB — MAGNESIUM: Magnesium: 2.1 mg/dL (ref 1.7–2.4)

## 2019-02-14 LAB — BASIC METABOLIC PANEL
Anion gap: 9 (ref 5–15)
BUN: 9 mg/dL (ref 6–20)
CO2: 27 mmol/L (ref 22–32)
Calcium: 9.7 mg/dL (ref 8.9–10.3)
Chloride: 105 mmol/L (ref 98–111)
Creatinine, Ser: 0.69 mg/dL (ref 0.44–1.00)
GFR calc Af Amer: >60 mL/min (ref >60)
GFR calc non Af Amer: >60 mL/min (ref >60)
Glucose, Bld: 173 mg/dL — ABNORMAL HIGH (ref 70–99)
Potassium: 3.4 mmol/L — ABNORMAL LOW (ref 3.5–5.1)
Sodium: 141 mmol/L (ref 135–145)

## 2019-02-14 LAB — GLUCOSE, CAPILLARY
Glucose-Capillary: 108 mg/dL — ABNORMAL HIGH (ref 70–99)
Glucose-Capillary: 118 mg/dL — ABNORMAL HIGH (ref 70–99)
Glucose-Capillary: 123 mg/dL — ABNORMAL HIGH (ref 70–99)
Glucose-Capillary: 124 mg/dL — ABNORMAL HIGH (ref 70–99)
Glucose-Capillary: 132 mg/dL — ABNORMAL HIGH (ref 70–99)
Glucose-Capillary: 185 mg/dL — ABNORMAL HIGH (ref 70–99)
Glucose-Capillary: 95 mg/dL (ref 70–99)

## 2019-02-14 LAB — TROPONIN I
Troponin I: <0.03 ng/mL (ref <0.03)
Troponin I: <0.03 ng/mL (ref <0.03)
Troponin I: <0.03 ng/mL (ref <0.03)

## 2019-02-14 LAB — PHOSPHORUS: Phosphorus: 3.4 mg/dL (ref 2.5–4.6)

## 2019-02-14 LAB — COMPREHENSIVE METABOLIC PANEL
ALT: 26 U/L (ref 0–44)
AST: 42 U/L — ABNORMAL HIGH (ref 15–41)
Albumin: 3.1 g/dL — ABNORMAL LOW (ref 3.5–5.0)
Alkaline Phosphatase: 57 U/L (ref 38–126)
Anion gap: 12 (ref 5–15)
BUN: 11 mg/dL (ref 6–20)
CO2: 22 mmol/L (ref 22–32)
Calcium: 9.2 mg/dL (ref 8.9–10.3)
Chloride: 106 mmol/L (ref 98–111)
Creatinine, Ser: 0.61 mg/dL (ref 0.44–1.00)
GFR calc Af Amer: >60 mL/min (ref >60)
GFR calc non Af Amer: >60 mL/min (ref >60)
Glucose, Bld: 122 mg/dL — ABNORMAL HIGH (ref 70–99)
Potassium: 5.8 mmol/L — ABNORMAL HIGH (ref 3.5–5.1)
Sodium: 140 mmol/L (ref 135–145)
Total Bilirubin: 1.2 mg/dL (ref 0.3–1.2)
Total Protein: 6.2 g/dL — ABNORMAL LOW (ref 6.5–8.1)

## 2019-02-14 LAB — ECHOCARDIOGRAM COMPLETE
Height: 67 in
Weight: 4814.85 oz

## 2019-02-14 LAB — CBC
HCT: 39.5 % (ref 36.0–46.0)
Hemoglobin: 12.5 g/dL (ref 12.0–15.0)
MCH: 26.5 pg (ref 26.0–34.0)
MCHC: 31.6 g/dL (ref 30.0–36.0)
MCV: 83.7 fL (ref 80.0–100.0)
Platelets: 202 10*3/uL (ref 150–400)
RBC: 4.72 MIL/uL (ref 3.87–5.11)
RDW: 14.8 % (ref 11.5–15.5)
WBC: 6.1 10*3/uL (ref 4.0–10.5)
nRBC: 0 % (ref 0.0–0.2)

## 2019-02-14 LAB — HIV ANTIBODY (ROUTINE TESTING W REFLEX): HIV Screen 4th Generation wRfx: NONREACTIVE

## 2019-02-14 LAB — CSF CELL COUNT WITH DIFFERENTIAL
RBC Count, CSF: 3 /mm3 — ABNORMAL HIGH
Tube #: 3
WBC, CSF: 7 /mm3 — ABNORMAL HIGH (ref 0–5)

## 2019-02-14 LAB — CBG MONITORING, ED: Glucose-Capillary: 113 mg/dL — ABNORMAL HIGH (ref 70–99)

## 2019-02-14 LAB — PROTEIN, CSF: Total  Protein, CSF: 59 mg/dL — ABNORMAL HIGH (ref 15–45)

## 2019-02-14 LAB — TSH: TSH: 1.682 u[IU]/mL (ref 0.350–4.500)

## 2019-02-14 LAB — GLUCOSE, CSF: Glucose, CSF: 79 mg/dL — ABNORMAL HIGH (ref 40–70)

## 2019-02-14 MED ORDER — ACETAMINOPHEN 650 MG RE SUPP: 650.0000 mg | Freq: Four times a day (QID) | RECTAL | Status: DC | PRN

## 2019-02-14 MED ORDER — ONDANSETRON HCL 4 MG PO TABS: 4.0000 mg | ORAL_TABLET | Freq: Four times a day (QID) | ORAL | Status: DC | PRN

## 2019-02-14 MED ORDER — LOSARTAN POTASSIUM 50 MG PO TABS
100.0000 mg | ORAL_TABLET | Freq: Every morning | ORAL | Status: DC
  Administered 2019-02-14 – 2019-02-20 (×7): 100 mg via ORAL
  Filled 2019-02-14 (×7): qty 2

## 2019-02-14 MED ORDER — SODIUM CHLORIDE 0.9% FLUSH
3.0000 mL | INTRAVENOUS | Status: DC | PRN
  Administered 2019-02-17: 3 mL via INTRAVENOUS
  Filled 2019-02-14: qty 3

## 2019-02-14 MED ORDER — ALPRAZOLAM 0.5 MG PO TABS
1.0000 mg | ORAL_TABLET | ORAL | Status: DC | PRN
  Administered 2019-02-14 – 2019-02-15 (×2): 1 mg via ORAL
  Filled 2019-02-14 (×2): qty 2

## 2019-02-14 MED ORDER — INSULIN ASPART 100 UNIT/ML ~~LOC~~ SOLN
0.0000 [IU] | SUBCUTANEOUS | Status: DC
  Administered 2019-02-14 (×2): 1 [IU] via SUBCUTANEOUS
  Administered 2019-02-14: 2 [IU] via SUBCUTANEOUS
  Administered 2019-02-14: 1 [IU] via SUBCUTANEOUS

## 2019-02-14 MED ORDER — BISACODYL 10 MG RE SUPP
10.0000 mg | Freq: Once | RECTAL | Status: AC
  Administered 2019-02-14: 10 mg via RECTAL
  Filled 2019-02-14: qty 1

## 2019-02-14 MED ORDER — ONDANSETRON HCL 4 MG/2ML IJ SOLN: 4.0000 mg | Freq: Four times a day (QID) | INTRAMUSCULAR | Status: DC | PRN

## 2019-02-14 MED ORDER — SODIUM CHLORIDE 0.9% FLUSH
3.0000 mL | Freq: Two times a day (BID) | INTRAVENOUS | Status: DC
  Administered 2019-02-14 – 2019-02-19 (×10): 3 mL via INTRAVENOUS

## 2019-02-14 MED ORDER — SODIUM ZIRCONIUM CYCLOSILICATE 10 G PO PACK
10.0000 g | PACK | Freq: Once | ORAL | Status: AC
  Administered 2019-02-14: 10 g via ORAL
  Filled 2019-02-14: qty 1

## 2019-02-14 MED ORDER — SODIUM CHLORIDE 0.9 % IV SOLN: 250.0000 mL | INTRAVENOUS | Status: DC | PRN

## 2019-02-14 MED ORDER — ACETAMINOPHEN 325 MG PO TABS: 650.0000 mg | ORAL_TABLET | Freq: Four times a day (QID) | ORAL | Status: DC | PRN

## 2019-02-14 MED ORDER — DOCUSATE SODIUM 100 MG PO CAPS
200.0000 mg | ORAL_CAPSULE | Freq: Once | ORAL | Status: AC
  Administered 2019-02-14: 200 mg via ORAL
  Filled 2019-02-14: qty 2

## 2019-02-14 MED ORDER — LIDOCAINE HCL (PF) 1 % IJ SOLN
5.0000 mL | Freq: Once | INTRAMUSCULAR | Status: AC
  Administered 2019-02-14: 3 mL via INTRADERMAL

## 2019-02-14 MED ORDER — AMLODIPINE BESYLATE 5 MG PO TABS
5.0000 mg | ORAL_TABLET | Freq: Every day | ORAL | Status: DC
  Administered 2019-02-14: 5 mg via ORAL
  Filled 2019-02-14: qty 1

## 2019-02-14 MED ORDER — GADOBUTROL 1 MMOL/ML IV SOLN
10.0000 mL | Freq: Once | INTRAVENOUS | Status: AC | PRN
  Administered 2019-02-14: 10 mL via INTRAVENOUS

## 2019-02-14 MED ORDER — POLYETHYLENE GLYCOL 3350 17 G PO PACK
17.0000 g | PACK | Freq: Every day | ORAL | Status: DC
  Administered 2019-02-14 – 2019-02-19 (×6): 17 g via ORAL
  Filled 2019-02-14 (×8): qty 1

## 2019-02-14 MED ORDER — ATORVASTATIN CALCIUM 10 MG PO TABS
20.0000 mg | ORAL_TABLET | Freq: Every day | ORAL | Status: DC
  Administered 2019-02-14 – 2019-02-20 (×7): 20 mg via ORAL
  Filled 2019-02-14 (×7): qty 2

## 2019-02-14 MED ORDER — HYDROCODONE-ACETAMINOPHEN 5-325 MG PO TABS
1.0000 | ORAL_TABLET | ORAL | Status: DC | PRN
  Administered 2019-02-14: 1 via ORAL
  Filled 2019-02-14: qty 1

## 2019-02-14 NOTE — Plan of Care (Signed)

## 2019-02-14 NOTE — Progress Notes (Signed)
Patient's body habitus precludes even palpation of landmarks for performing LP. Requested Radiology for a LP under fluoro. Chart order with CSF studies requisition placed. Patient amenable for procedure and also spoke with son Herbie [place], who understands the necessity.   -- Amie Portland, MD [facility] Pager: [phone] If 7pm to 7am, please call on call as listed on AMION.

## 2019-02-14 NOTE — Progress Notes (Signed)
Left lower extremity venous duplex completed. Refer to "CV Proc" under chart review to view preliminary results.  02/14/2019 2:39 PM Maudry Mayhew, MHA, RVT, RDCS, RDMS

## 2019-02-14 NOTE — Consult Note (Signed)
Neurology Consultation  Reason for Consult: Bilateral leg weakness Referring Physician: Dr. Benny Lennert  CC: Bilateral leg weakness  History is obtained from: Patient, chart  HPI: Laura Blackwell is a 63 y.o. female past medical history of obesity, arthritis, bilateral hip replacements, diabetes, hypertension, presenting to an outside hospital with concerns of sudden onset of bilateral lower extremity weakness.  She reports that on Friday, 10 February 2019, she was feeling some pain in both her buttocks, she was at work and got up from chair and had a sudden collapse and the next thing she remembers is that she was being evaluated by the first responders.  She thought she felt weak in her legs and fell and hit her head and lost consciousness.  She had attributed that event of pain in the buttocks to her recent exercise regimen that included squats.   On first responder evaluation that Friday, she was able to wiggle her toes and walk some and went home but over the course of the next day or 2 her symptoms became worse to the point where she had no movement in her legs and that gradually started to improve over the last 1 to 2 days where now she can wiggle her toes and lift her leg up from bed more than what she could 2 days ago. She also could not feel her pelvic area as she says as well as had difficulty knowing that she had to go to the bathroom to the point that she was having complete incontinence of bowel and bladder.  At this point she says that she knows that she might need to go to the bathroom because she has not used one but she could not void her bladder.  Her primary care note review from November and December 2019 said that she had already started to develop some hip flexor weakness with the low back pain.  She presented to an [facility] long hospital yesterday and was sent to [facility] because of better scanner/more convenient scanner to get an MRI. She got a lumbar spine MRI which  had concern for a conus medullaris lesion, which was followed by a thoracic and lumbar spine MRI with and without contrast which showed a lesion described below.  She reports chronic back pain at least since 2019, for which she had seen her orthopedic doctors as she is established patient for her bilateral hip replacements, who told her that the pain might be related to her spinal degenerative disc disease and recommended doing an MRI but she is extremely claustrophobic and had not been able to get an MRI till this presentation at the hospital.  She reports in 2019 she had acute back pain followed by radiation of that pain at the back of her buttocks but she could walk and had no frank weakness-the only baseline weakness she has is secondary to her bilateral hip replacements.  She denies any episodes of tingling or numbness anywhere else in the body in the past.  Denies any visual symptoms including blurred vision or double vision or loss of vision.  Denies any family history of autoimmune diseases or multiple sclerosis.   LKW: Friday, February 10, 2019 tpa given?: no, outside the window Premorbid modified Rankin scale (mRS):2   ROS: ROS was performed and is negative except as noted in the HPI.  Past Medical History:  Diagnosis Date  . Arthritis   . Diabetes mellitus without complication ([place])   . Edema    in left leg  in 1980s on left ankle   . Hypertension   . PCOS (polycystic ovarian syndrome)   . Swelling   . Vitamin D deficiency    Family History  Problem Relation Age of Onset  . Diabetes Mother   . Hypertension Mother    Social History:   reports that she quit smoking about 16 years ago. She has never used smokeless tobacco. She reports that she does not drink alcohol or use drugs.  Medications  Current Facility-Administered Medications:  .  0.9 %  sodium chloride infusion, 250 mL, Intravenous, PRN, Doutova, Anastassia, MD .  acetaminophen (TYLENOL) tablet 650 mg, 650 mg, Oral,  Q6H PRN **OR** acetaminophen (TYLENOL) suppository 650 mg, 650 mg, Rectal, Q6H PRN, Doutova, Anastassia, MD .  ALPRAZolam Duanne Moron) tablet 1 mg, 1 mg, Oral, PRN, Roel Cluck, Anastassia, MD, 1 mg at 02/14/19 0435 .  atorvastatin (LIPITOR) tablet 20 mg, 20 mg, Oral, Daily, Doutova, Anastassia, MD, 20 mg at 02/14/19 0950 .  HYDROcodone-acetaminophen (NORCO/VICODIN) 5-325 MG per tablet 1-2 tablet, 1-2 tablet, Oral, Q4H PRN, Toy Baker, MD, 1 tablet at 02/14/19 0950 .  insulin aspart (novoLOG) injection 0-9 Units, 0-9 Units, Subcutaneous, Q4H, Doutova, Anastassia, MD, 1 Units at 02/14/19 0118 .  LORazepam (ATIVAN) injection 0.5 mg, 0.5 mg, Intravenous, Once, Doutova, Anastassia, MD .  losartan (COZAAR) tablet 100 mg, 100 mg, Oral, q morning - 10a, Doutova, Anastassia, MD, 100 mg at 02/14/19 0950 .  ondansetron (ZOFRAN) tablet 4 mg, 4 mg, Oral, Q6H PRN **OR** ondansetron (ZOFRAN) injection 4 mg, 4 mg, Intravenous, Q6H PRN, Doutova, Anastassia, MD .  sodium chloride flush (NS) 0.9 % injection 3 mL, 3 mL, Intravenous, Q12H, Doutova, Anastassia, MD, 3 mL at 02/14/19 0952 .  sodium chloride flush (NS) 0.9 % injection 3 mL, 3 mL, Intravenous, PRN, Doutova, Anastassia, MD .  sodium zirconium cyclosilicate (LOKELMA) packet 10 g, 10 g, Oral, Once, Swayze, Ava, DO  Exam: Current vital signs: BP 128/76 (BP Location: Left Arm)   Pulse 80   Temp 98 F (36.7 C) (Oral)   Resp 17   Ht 5\' 7"  (1.702 m)   Wt (!) 136.5 kg   SpO2 100%   BMI 47.13 kg/m  Vital signs in last 24 hours: Temp:  [98 F (36.7 C)-98.8 F (37.1 C)] 98 F (36.7 C) (06/16 0726) Pulse Rate:  [80-110] 80 (06/16 0726) Resp:  [14-20] 17 (06/16 0726) BP: (116-151)/(63-91) 128/76 (06/16 0726) SpO2:  [96 %-100 %] 100 % (06/16 0726) Weight:  [136.5 kg] 136.5 kg (06/16 0050) GENERAL: Awake, alert in NAD HEENT: - Normocephalic and atraumatic, dry mm, no LN++, no Thyromegally LUNGS - Clear to auscultation bilaterally with no wheezes CV -  S1S2 RRR, no m/r/g, equal pulses bilaterally. ABDOMEN - Soft, nontender, nondistended with normoactive BS Ext: warm, well perfused, intact peripheral pulses, +edema in left foot and leg  NEURO:  Mental Status: AA&Ox3  Language: speech is clear.  Naming, repetition, fluency, and comprehension intact. Cranial Nerves: PERRL. EOMI, visual fields full, no facial asymmetry, facial sensation intact, hearing intact, tongue/uvula/soft palate midline, normal sternocleidomastoid and trapezius muscle strength. No evidence of tongue atrophy or fibrillations Motor: UE 5/5 blaterally In the lower extremities - she has a barely 4/5 strength in both hip flexors. Right knee extension 4-/5, Right plantar flexion 3/5, Right dorsiflexion 4-/5 Left knee extension 4-/5, Left plantar and dorsiflexion nearly 4/5. Tone: is normal and bulk is normal Sensation- diminished to pinprick on the right compared to left with level at T11-T12,  more pronounced on the right than left. Also reports saddle anesthesia Coordination: FTN intact bilaterally Gait- deferred DTR-absent on the left trace in the lower extremities on the right.  1+ biceps bilaterally.  Labs I have reviewed labs in epic and the results pertinent to this consultation are:  CBC    Component Value Date/Time   WBC 6.1 02/14/2019 1011   RBC 4.72 02/14/2019 1011   HGB 12.5 02/14/2019 1011   HGB 13.3 12/31/2016 1032   HCT 39.5 02/14/2019 1011   HCT 40.2 12/31/2016 1032   PLT 202 02/14/2019 1011   PLT 223 12/31/2016 1032   MCV 83.7 02/14/2019 1011   MCV 80 12/31/2016 1032   MCH 26.5 02/14/2019 1011   MCHC 31.6 02/14/2019 1011   RDW 14.8 02/14/2019 1011   RDW 16.3 (H) 12/31/2016 1032   LYMPHSABS 1.1 02/13/2019 1555   LYMPHSABS 1.2 12/31/2016 1032   MONOABS 0.5 02/13/2019 1555   EOSABS 0.0 02/13/2019 1555   EOSABS 0.1 12/31/2016 1032   BASOSABS 0.0 02/13/2019 1555   BASOSABS 0.0 12/31/2016 1032    CMP     Component Value Date/Time   NA 140  02/14/2019 0809   NA 141 05/04/2017 0818   K 5.8 (H) 02/14/2019 0809   CL 106 02/14/2019 0809   CO2 22 02/14/2019 0809   GLUCOSE 122 (H) 02/14/2019 0809   BUN 11 02/14/2019 0809   BUN 11 05/04/2017 0818   CREATININE 0.61 02/14/2019 0809   CALCIUM 9.2 02/14/2019 0809   PROT 6.2 (L) 02/14/2019 0809   PROT 6.9 05/04/2017 0818   ALBUMIN 3.1 (L) 02/14/2019 0809   ALBUMIN 4.1 05/04/2017 0818   AST 42 (H) 02/14/2019 0809   ALT 26 02/14/2019 0809   ALKPHOS 57 02/14/2019 0809   BILITOT 1.2 02/14/2019 0809   BILITOT 0.2 05/04/2017 0818   GFRNONAA >60 02/14/2019 0809   GFRAA >60 02/14/2019 0809   Imaging I have reviewed the images obtained: MRI of the thoracic and lumbar spine with and without contrast Official reading IMPRESSION: 1. Lesion of the conus medullaris and distal spinal cord does not enhance or show expansion. No evidence of underlying neoplasm or vascular malformation. This is felt to be the cause of the patient's symptoms and most likely is an area of acute demyelinization. Possible multiple sclerosis or transverse myelitis. No other cord lesions. Recommend MRI brain without and with contrast. 2. Distended urinary bladder suggesting urinary retention. Recommend Foley catheter.  Assessment: 63 year old woman past medical history of obesity, arthritis, bilateral hip replacements, diabetes, hypertension presenting for rather sudden onset of bilateral lower extremity weakness that made her fall and gradually progressed to, what she describes as complete paralysis of her lower extremities and then started to improve over the last 1 to 2 days. Exam is consistent with a lower thoracic/upper lumbar lesion.  This correlates with the lesion seen on MRI. Going by her chart, she has had back discomfort and pain now for at least 8 to 10 months but never had any such episode, that made her present this time. Her primary care notes do mention some evolving hip flexor weakness Given the  MRI findings, the top of my differential is spinal cord ischemia versus demyelination. Diffusion-weighted imaging for spinal cord is usually a hit or miss and would not be helpful at this time as no acute intervention can be done at this time. Other differentials such as a malignancy or metastasis are lower on the differentials but can be considered if all  other work-up remains unremarkable.  Impression: Evaluate for spinal cord ischemia Evaluate for transverse myelitis-demyelination Evaluate for underlying malignancy.  Recommendations: I would recommend getting CSF for further analysis including protein, glucose, cell count, cytology, flow cytometry as well as oligoclonal bands and IgG index. I would also order an MRI of the brain and C-spine with and without contrast to look for any evidence of supratentorial or C-spine demyelinating lesions. At this time, I would hold off on using steroids, at least till preliminary CSF results are back. Continue PT OT We will continue to follow with you.  -- Amie Portland, MD [facility] Pager: [phone] If 7pm to 7am, please call on call as listed on AMION.

## 2019-02-14 NOTE — Procedures (Signed)
Diagnostic lumbar puncture performed at L3-4 without immediate complications.Details dictated in Radiology report.

## 2019-02-14 NOTE — Progress Notes (Signed)
  Echocardiogram 2D Echocardiogram has been performed.  Laura Blackwell 02/14/2019, 1:58 PM

## 2019-02-14 NOTE — Progress Notes (Signed)
PROGRESS NOTE  Laura Blackwell:[id number] DOB: Feb 27, 1956 DOA: 02/13/2019 PCP: Carol Ada, MD  Brief History   Laura Blackwell is a 63 y.o. female with medical history significant of, diabetes mellitus, HTN, PCOS, vitamin D deficiency, sp right hip replacement   The patient is a 63 yr old woman who presented with lower back pain and weakness. She states that she had a fall on Friday 4 days ago at work due to significant weakness in her lower extremities but time she woke up on Saturday she had numbness on both buttocks and down to the back of her legs worse on the left.  She has been having significant low back pain.Denies hitting her head. On Saturday she stood up and took 2 steps and fell flat. She must have been unconscious for a while bc when she woke up EMS was there.  In the ED the patient was found to have no sensation in her low back, perineum. She is unable to stand or move her toes. She had lost control of bowel and bladder. He has a history of what sounded like radicular pain that has been evaluated by orthopedic surgery who told her that it was not due to her hips. She has had injections in her back early in the year. She states that her symptoms improved with physical therapy. She was transferred from [place] to [facility] as she was unable to tolerate an MRI at [facility]. The hope was that she could have an open MRI here.  She was admitted to a medical bed here. Neurology has been consulted.  Consultants  . Neurology  Procedures  . Lumbar puncture  Antibiotics   Anti-infectives (From admission, onward)   None    .  Marland Kitchen   Subjective  The patient states that she has had some return of sensation in her perineum and lower extremities although she still has no strength and cannot control her bowel or bladder.  Objective   Vitals:  Vitals:   02/14/19 1100 02/14/19 1700  BP: 134/83 123/72  Pulse: 88 84  Resp:    Temp: 98.2 F (36.8 C) 97.9 F (36.6 C)   SpO2: 100% 98%    Exam:  Constitutional:  . The patient is awake, alert, and oriented x 3. No acute distress. Respiratory:  . No increased work of breathing. . No wheezes, rales, or rhonchi.  . No tactile fremitus. Cardiovascular:  . Regular rate and rhythm. . No LE extremity edema   . Normal pedal pulses Abdomen:  . Abdomen appears normal; no tenderness or masses . No hernias . No HSM Musculoskeletal:  . No cyanosis, clubbing or edema. Skin:  . No rashes, lesions, ulcers . palpation of skin: no induration or nodules Neurologic:  . CN 2-12 intact . Loss of sensation and motor to lower extremities and perineum. Psychiatric:  . Mental status o Mood, affect appropriate o Orientation to person, place, time  . judgment and insight appear intact    I have personally reviewed the following:   Today's Data  . BMP, CBC, Vitals  Scheduled Meds: . atorvastatin  20 mg Oral Daily  . bisacodyl  10 mg Rectal Once  . docusate sodium  200 mg Oral Once  . insulin aspart  0-9 Units Subcutaneous Q4H  . LORazepam  0.5 mg Intravenous Once  . losartan  100 mg Oral q morning - 10a  . polyethylene glycol  17 g Oral Daily  . sodium chloride flush  3 mL Intravenous Q12H   Continuous Infusions: . sodium chloride      Active Problems:   Type 2 diabetes mellitus without complication, without long-term current use of insulin (HCC)   Morbid obesity (HCC)   Cauda equina syndrome (HCC)   DM2 (diabetes mellitus, type 2) (HCC)   Syncope   Essential hypertension   LOS: 1 day    A & P   Cauda equina vs transverse myelitis, vs spinal chord CVA [facility])  Neurology has been consulted. And plan is for open MRI today. Neurosurgery contacted me to let me know that they are available, if needed, but that they thought that transverse myelitis was most likely and that neurology would be the appropriate service for this diagnosis.   Morbid obesity ([place]) Noted. Chronic and stable.  We will need  nutritional consult as an outpatient.  Syncope: Occurred more than 24 hours ago with onset of paresthesia and weakness. Will observe on telemetry to see if there is any arrhythmias.  Check troponin and obtain echo.  Essential hypertension: Resume home medications when able to tolerate.  Constipation: Stool softeners ordered. Monitor.  DM 2 : Glucoses will be followed with FSBS and SSI. HBA1c was Order Sensitive SSI. TSH is normal and HbA1c is pending. Oral medications have been held.  Leg edema on the left:  Pt states this has been the case since surgery in 1980, but has gotten worse lately. Doppler was negative for DVT.  I have seen and examined this patient myself. I have spent 35 minutes in her evaluation and care.  DVT prophylaxis:  SCD    Code Status:  FULL CODE  as per patient   Family Communication:   No family at bedside Disposition Plan:     likely will need placement for rehabilitation  Zaven Klemens, DO [facility] Direct contact: see [email or web address]  7PM-7AM contact night coverage as above 02/14/2019, 6:33 PM  LOS: 1 day

## 2019-02-14 NOTE — Progress Notes (Signed)
  Echocardiogram 2D Echocardiogram has been performed.  Johny Chess 02/14/2019, 1:58 PM

## 2019-02-14 NOTE — Progress Notes (Signed)
Report received from [place], [place] ([place]) at 2230. Pt on unit at 0038, AOx4, no diet ordered d/t pt being NPO for MRI. Pt oriented to unit, call bell an room phone within reach of pt.

## 2019-02-15 ENCOUNTER — Ambulatory Visit: Payer: Self-pay

## 2019-02-15 ENCOUNTER — Inpatient Hospital Stay (HOSPITAL_COMMUNITY): Payer: 59

## 2019-02-15 LAB — BASIC METABOLIC PANEL
Anion gap: 10 (ref 5–15)
BUN: 11 mg/dL (ref 6–20)
CO2: 24 mmol/L (ref 22–32)
Calcium: 9.5 mg/dL (ref 8.9–10.3)
Chloride: 105 mmol/L (ref 98–111)
Creatinine, Ser: 0.54 mg/dL (ref 0.44–1.00)
GFR calc Af Amer: >60 mL/min (ref >60)
GFR calc non Af Amer: >60 mL/min (ref >60)
Glucose, Bld: 114 mg/dL — ABNORMAL HIGH (ref 70–99)
Potassium: 3.7 mmol/L (ref 3.5–5.1)
Sodium: 139 mmol/L (ref 135–145)

## 2019-02-15 LAB — CBC WITH DIFFERENTIAL/PLATELET
Abs Immature Granulocytes: 0.02 10*3/uL (ref 0.00–0.07)
Basophils Absolute: 0 10*3/uL (ref 0.0–0.1)
Basophils Relative: 0 %
Eosinophils Absolute: 0.1 10*3/uL (ref 0.0–0.5)
Eosinophils Relative: 1 %
HCT: 39.6 % (ref 36.0–46.0)
Hemoglobin: 12.7 g/dL (ref 12.0–15.0)
Immature Granulocytes: 0 %
Lymphocytes Relative: 16 %
Lymphs Abs: 1.3 10*3/uL (ref 0.7–4.0)
MCH: 26.8 pg (ref 26.0–34.0)
MCHC: 32.1 g/dL (ref 30.0–36.0)
MCV: 83.7 fL (ref 80.0–100.0)
Monocytes Absolute: 0.6 10*3/uL (ref 0.1–1.0)
Monocytes Relative: 7 %
Neutro Abs: 5.9 10*3/uL (ref 1.7–7.7)
Neutrophils Relative %: 76 %
Platelets: 180 10*3/uL (ref 150–400)
RBC: 4.73 MIL/uL (ref 3.87–5.11)
RDW: 14.7 % (ref 11.5–15.5)
WBC: 7.9 10*3/uL (ref 4.0–10.5)
nRBC: 0 % (ref 0.0–0.2)

## 2019-02-15 LAB — GLUCOSE, CAPILLARY
Glucose-Capillary: 110 mg/dL — ABNORMAL HIGH (ref 70–99)
Glucose-Capillary: 106 mg/dL — ABNORMAL HIGH (ref 70–99)
Glucose-Capillary: 119 mg/dL — ABNORMAL HIGH (ref 70–99)
Glucose-Capillary: 333 mg/dL — ABNORMAL HIGH (ref 70–99)
Glucose-Capillary: 256 mg/dL — ABNORMAL HIGH (ref 70–99)

## 2019-02-15 LAB — HEMOGLOBIN A1C
Hgb A1c MFr Bld: 7.5 % — ABNORMAL HIGH (ref 4.8–5.6)
Mean Plasma Glucose: 169 mg/dL

## 2019-02-15 LAB — VDRL, CSF: VDRL Quant, CSF: NONREACTIVE

## 2019-02-15 LAB — CSF IGG: IgG, CSF: 4.5 mg/dL (ref 0.0–8.6)

## 2019-02-15 MED ORDER — SODIUM CHLORIDE 0.9 % IV SOLN
1000.0000 mg | Freq: Every day | INTRAVENOUS | Status: AC
  Administered 2019-02-15 – 2019-02-19 (×5): 1000 mg via INTRAVENOUS
  Filled 2019-02-15 (×5): qty 8

## 2019-02-15 MED ORDER — INSULIN GLARGINE 100 UNIT/ML ~~LOC~~ SOLN
12.0000 [IU] | Freq: Every day | SUBCUTANEOUS | Status: DC
  Administered 2019-02-15 – 2019-02-16 (×2): 12 [IU] via SUBCUTANEOUS
  Filled 2019-02-15 (×2): qty 0.12

## 2019-02-15 MED ORDER — INSULIN ASPART 100 UNIT/ML ~~LOC~~ SOLN
0.0000 [IU] | Freq: Three times a day (TID) | SUBCUTANEOUS | Status: DC
  Administered 2019-02-15: 15 [IU] via SUBCUTANEOUS
  Administered 2019-02-16: 11 [IU] via SUBCUTANEOUS
  Administered 2019-02-16: 7 [IU] via SUBCUTANEOUS

## 2019-02-15 NOTE — Evaluation (Addendum)
Occupational Therapy Evaluation Patient Details Name: Laura Blackwell MRN: [id number] DOB: May 22, 1956 Today's Date: 02/15/2019    History of Present Illness Laura Blackwell is a 63 y.o. female with medical history significant of, diabetes mellitus, HTN, PCOS, vitamin D deficiency, sp right hip replacement. Pt presenting for a rather sudden onset of bilateral lower extremity weakness that made her fall and gradually progressed to paralysis of her lower extremities which then started to improve over the last 1 or 2 days.  Neuro:  steroids. Primary differentials at this time are spinal cord infarct at the conus level versus transverse myelitis. Spinal tap performed: CSF examination none consistent with infection.   Clinical Impression   Pt PTA: living with family and independent prior. Pt currently set-upA for UB ADL and maxA for LB ADL as pt is weak and unable to move BLEs toward pt and unable to bend. Pt continues to be numb in perineal region and sensation slowly coming back in BLEs. Pt performing ADL functional transfers and ADL functional mobility with modA and RW and LOB x1 for leaning forward and picking up RW requiring cues to restart sequence for mobility. Pt very motivated to return to PLOF. Pt requiring hospital bed to promote independence for ADL and mobility. Pt would benefit from continued OT skilled service for ADL,mobility and safety in [facility] setting. OT following acutely.     Follow Up Recommendations  Home health OT;Supervision/Assistance - 24 hour    Equipment Recommendations  Hospital bed    Recommendations for Other Services       Precautions / Restrictions Precautions Precautions: Fall;Other (comment) Precaution Comments: bed rest other than to bathroom Restrictions Weight Bearing Restrictions: No      Mobility Bed Mobility Overal bed mobility: Needs Assistance Bed Mobility: Sidelying to Sit;Sit to Supine   Sidelying to sit: Min guard   Sit to supine: Min  guard   General bed mobility comments: use of rail  Transfers Overall transfer level: Needs assistance Equipment used: Rolling walker (2 wheeled) Transfers: Sit to/from Stand Sit to Stand: Min assist         General transfer comment: minA for safety and cues for proper sequencing for turning toward [facility]    Balance Overall balance assessment: Needs assistance   Sitting balance-Leahy Scale: Good       Standing balance-Leahy Scale: Poor Standing balance comment: requiring cues for sequencing with RW and picked up RW at one point and leaned forward requiring modA to stand upright                           ADL either performed or assessed with clinical judgement   ADL Overall ADL's : Needs assistance/impaired Eating/Feeding: Modified independent;Sitting   Grooming: Wash/dry hands;Wash/dry face;Applying deodorant;Set up;Sitting   Upper Body Bathing: Set up;Sitting   Lower Body Bathing: Maximal assistance;Sitting/lateral leans;Sit to/from stand   Upper Body Dressing : Set up;Sitting   Lower Body Dressing: Maximal assistance;Sitting/lateral leans;Sit to/from stand   Toilet Transfer: Minimal assistance;BSC;RW   Toileting- Clothing Manipulation and Hygiene: Maximal assistance;Sitting/lateral lean;Sit to/from stand       Functional mobility during ADLs: Moderate assistance;Cueing for safety;Rolling walker General ADL Comments: Pt set-upA for UB ADL and maxA for LB ADL. pt continues to be numb in perineal region, but sensation slowly coming back in BLEs.     Vision Baseline Vision/History: No visual deficits Vision Assessment?: No apparent visual deficits     Perception  Praxis      Pertinent Vitals/Pain       Hand Dominance Right   Extremity/Trunk Assessment Upper Extremity Assessment Upper Extremity Assessment: Generalized weakness   Lower Extremity Assessment Lower Extremity Assessment: Defer to PT evaluation;LLE deficits/detail;RLE  deficits/detail;Generalized weakness RLE Deficits / Details: decreased sensation LLE Deficits / Details: decreased sensation   Cervical / Trunk Assessment Cervical / Trunk Assessment: Other exceptions Cervical / Trunk Exceptions: large body habitus   Communication Communication Communication: No difficulties   Cognition Arousal/Alertness: Awake/alert Behavior During Therapy: WFL for tasks assessed/performed Overall Cognitive Status: Within Functional Limits for tasks assessed                                     General Comments  Sensation is returning in BLEs slowly, still less sensitive than BUEs.    Exercises     Shoulder Instructions      Home Living Family/patient expects to be discharged to:: Private residence Living Arrangements: Children Available Help at Discharge: Family;Available 24 hours/day Type of Home: House Home Access: Stairs to enter [employer] of Steps: 5 Entrance Stairs-Rails: Can reach both Home Layout: Two level;1/2 bath on main level Alternate Level Stairs-Number of Steps: 16   Bathroom Shower/Tub: Occupational psychologist: Handicapped height     Home Equipment: Environmental consultant - 2 wheels;Bedside commode;Grab bars - toilet;Grab bars - tub/shower          Prior Functioning/Environment Level of Independence: Independent                 OT Problem List: Decreased strength;Decreased activity tolerance;Impaired balance (sitting and/or standing);Decreased coordination;Decreased safety awareness;Increased edema      OT Treatment/Interventions: Self-care/ADL training;Therapeutic exercise;Neuromuscular education;Energy conservation;DME and/or AE instruction;Therapeutic activities;Patient/family education;Balance training    OT Goals(Current goals can be found in the care plan section) Acute Rehab OT Goals Patient Stated Goal: to go home OT Goal Formulation: With patient Time For Goal Achievement: 03/01/19 Potential  to Achieve Goals: Good ADL Goals Pt Will Perform Grooming: with modified independence;standing Pt Will Perform Upper Body Dressing: with modified independence;standing Pt Will Perform Lower Body Dressing: with modified independence;sitting/lateral leans;sit to/from stand Pt Will Perform Toileting - Clothing Manipulation and hygiene: with set-up;sitting/lateral leans;sit to/from stand Additional ADL Goal #1: Pt will perform OOB ADL with set-upA  with fair balance in standing x5 mins  OT Frequency: Min 2X/week   Barriers to D/C:            Co-evaluation              AM-PAC OT "6 Clicks" Daily Activity     Outcome Measure Help from another person eating meals?: None Help from another person taking care of personal grooming?: None Help from another person toileting, which includes using toliet, bedpan, or urinal?: A Lot Help from another person bathing (including washing, rinsing, drying)?: A Lot Help from another person to put on and taking off regular upper body clothing?: A Little Help from another person to put on and taking off regular lower body clothing?: A Lot 6 Click Score: 17   End of Session Equipment Utilized During Treatment: Gait belt;Rolling walker Nurse Communication: Mobility status  Activity Tolerance: Patient tolerated treatment well Patient left: in bed;with call bell/phone within reach;with bed alarm set  OT Visit Diagnosis: Unsteadiness on feet (R26.81);Muscle weakness (generalized) (M62.81);Other abnormalities of gait and mobility (R26.89)  Time: 1510-1535 OT Time Calculation (min): 25 min Charges:  OT General Charges $OT Visit: 1 Visit OT Evaluation $OT Eval Moderate Complexity: 1 Mod OT Treatments $Neuromuscular Re-education: 8-22 mins  Ebony Hail Harold Hedge) Marsa Aris OTR/L Acute Rehabilitation Services Pager: [phone] Office: [address] 02/15/2019, 4:31 PM

## 2019-02-15 NOTE — Plan of Care (Signed)

## 2019-02-15 NOTE — Progress Notes (Addendum)
Neurology Progress Note   S:// Patient seen and examined.  Reports improvement in her leg strength. Had a uneventful spinal tap under fluoroscopy guidance yesterday.  Appreciate radiology for performing the tap.  O:// Current vital signs: BP (!) 142/81 (BP Location: Left Arm)   Pulse 92   Temp 97.7 F (36.5 C) (Oral)   Resp 18   Ht 5\' 7"  (1.702 m)   Wt (!) 136.5 kg   SpO2 100%   BMI 47.13 kg/m  Vital signs in last 24 hours: Temp:  [97.7 F (36.5 C)-98.2 F (36.8 C)] 97.7 F (36.5 C) (06/17 0729) Pulse Rate:  [74-92] 92 (06/17 0729) Resp:  [18] 18 (06/17 0729) BP: (123-142)/(71-88) 142/81 (06/17 0729) SpO2:  [96 %-100 %] 100 % (06/17 0729) Neurological exam Awake alert oriented x2 Speech is non-dysarthric No evidence of aphasia Cranial nerves: Pupils equal round react to light, extraocular movements intact, visual fields full, face symmetric, tongue and palate midline. Motor exam: Upper extremity 5/5 bilaterally.  3-4/5 hip flexors, 4/5 knee flexion and extension, 4/5 plantar and dorsiflexors bilaterally. Sensory exam: Continues to have a level at around T12.  Decreased sensation on the right compared to left.  Some improvement in the saddle anesthesia but still continues to have decreased sensation. Coordination: Intact finger-nose-finger Deep tendon reflexes: Absent on the left lower extremity, 1+ biceps bilaterally, 1+ right ankle and knee jerks. Medications  Current Facility-Administered Medications:  .  0.9 %  sodium chloride infusion, 250 mL, Intravenous, PRN, Doutova, Anastassia, MD .  acetaminophen (TYLENOL) tablet 650 mg, 650 mg, Oral, Q6H PRN **OR** acetaminophen (TYLENOL) suppository 650 mg, 650 mg, Rectal, Q6H PRN, Doutova, Anastassia, MD .  ALPRAZolam Duanne Moron) tablet 1 mg, 1 mg, Oral, PRN, Roel Cluck, Anastassia, MD, 1 mg at 02/14/19 0435 .  atorvastatin (LIPITOR) tablet 20 mg, 20 mg, Oral, Daily, Doutova, Anastassia, MD, 20 mg at 02/14/19 0950 .   HYDROcodone-acetaminophen (NORCO/VICODIN) 5-325 MG per tablet 1-2 tablet, 1-2 tablet, Oral, Q4H PRN, Toy Baker, MD, 1 tablet at 02/14/19 0950 .  insulin aspart (novoLOG) injection 0-9 Units, 0-9 Units, Subcutaneous, Q4H, Doutova, Anastassia, MD, 1 Units at 02/14/19 2343 .  LORazepam (ATIVAN) injection 0.5 mg, 0.5 mg, Intravenous, Once, Doutova, Anastassia, MD .  losartan (COZAAR) tablet 100 mg, 100 mg, Oral, q morning - 10a, Doutova, Anastassia, MD, 100 mg at 02/14/19 0950 .  ondansetron (ZOFRAN) tablet 4 mg, 4 mg, Oral, Q6H PRN **OR** ondansetron (ZOFRAN) injection 4 mg, 4 mg, Intravenous, Q6H PRN, Doutova, Anastassia, MD .  polyethylene glycol (MIRALAX / GLYCOLAX) packet 17 g, 17 g, Oral, Daily, Swayze, Ava, DO, 17 g at 02/14/19 2025 .  sodium chloride flush (NS) 0.9 % injection 3 mL, 3 mL, Intravenous, Q12H, Doutova, Anastassia, MD, 3 mL at 02/14/19 2150 .  sodium chloride flush (NS) 0.9 % injection 3 mL, 3 mL, Intravenous, PRN, Toy Baker, MD Labs CBC    Component Value Date/Time   WBC 7.9 02/15/2019 0454   RBC 4.73 02/15/2019 0454   HGB 12.7 02/15/2019 0454   HGB 13.3 12/31/2016 1032   HCT 39.6 02/15/2019 0454   HCT 40.2 12/31/2016 1032   PLT 180 02/15/2019 0454   PLT 223 12/31/2016 1032   MCV 83.7 02/15/2019 0454   MCV 80 12/31/2016 1032   MCH 26.8 02/15/2019 0454   MCHC 32.1 02/15/2019 0454   RDW 14.7 02/15/2019 0454   RDW 16.3 (H) 12/31/2016 1032   LYMPHSABS 1.3 02/15/2019 0454   LYMPHSABS 1.2 12/31/2016 1032  MONOABS 0.6 02/15/2019 0454   EOSABS 0.1 02/15/2019 0454   EOSABS 0.1 12/31/2016 1032   BASOSABS 0.0 02/15/2019 0454   BASOSABS 0.0 12/31/2016 1032    CMP     Component Value Date/Time   NA 139 02/15/2019 0454   NA 141 05/04/2017 0818   K 3.7 02/15/2019 0454   CL 105 02/15/2019 0454   CO2 24 02/15/2019 0454   GLUCOSE 114 (H) 02/15/2019 0454   BUN 11 02/15/2019 0454   BUN 11 05/04/2017 0818   CREATININE 0.54 02/15/2019 0454   CALCIUM 9.5  02/15/2019 0454   PROT 6.2 (L) 02/14/2019 0809   PROT 6.9 05/04/2017 0818   ALBUMIN 3.1 (L) 02/14/2019 0809   ALBUMIN 4.1 05/04/2017 0818   AST 42 (H) 02/14/2019 0809   ALT 26 02/14/2019 0809   ALKPHOS 57 02/14/2019 0809   BILITOT 1.2 02/14/2019 0809   BILITOT 0.2 05/04/2017 0818   GFRNONAA >60 02/15/2019 0454   GFRAA >60 02/15/2019 0454  CSF examination shows glucose 79, 3 RBCs, 7 WBCs, protein 59. IgG index and oligoclonal bands pending.  Cytology and flow cytometry pending  Imaging-no new imaging today I have reviewed images in epic and the results pertinent to this consultation are: MRI examination of the brain reviewed as per last note.  Assessment: 63 year old woman past history of obesity arthritis bilateral hip replacement diabetes hypertension presenting for a rather sudden onset of bilateral lower extremity weakness that made her fall and gradually progressed to what she described as complete paralysis of her lower extremities which then started to improve over the last 1 or 2 days. Exam consistent with a lower thoracic/upper lumbar spinal cord lesion an MRI shows a lesion correlating to that area. Primary differentials at this time are spinal cord infarct at the conus level versus transverse myelitis. CSF examination none consistent with infection.  Recommendations: At this point, I would recommend initiating high-dose IV steroids-methylprednisolone 1 g IV x5 days. Monitor her blood sugars very closely during this time. Continue PT OT MRI brain + Cspine with and without to look for evidence of demyelination I will also send out for neuromyelitis optica antibodies. She will need outpatient follow-up with Dr. Felecia Shelling at [facility] neurology upon discharge. Patient requested FMLA paperwork assistance/refer to social work.  -- Amie Portland, MD [facility] Pager: [phone] If 7pm to 7am, please call on call as listed on AMION.

## 2019-02-15 NOTE — Progress Notes (Signed)
Pt off unit to MRI at 2130.

## 2019-02-15 NOTE — Progress Notes (Signed)
PROGRESS NOTE  [name] URK:[id number] DOB: October 09, 1955 DOA: 02/13/2019 PCP: Carol Ada, MD  Brief History   Laura Blackwell is a 63 y.o. female with medical history significant of, diabetes mellitus, HTN, PCOS, vitamin D deficiency, sp right hip replacement   The patient is a 63 yr old woman who presented with lower back pain and weakness. She states that she had a fall on Friday 4 days ago at work due to significant weakness in her lower extremities but time she woke up on Saturday she had numbness on both buttocks and down to the back of her legs worse on the left.  She has been having significant low back pain.Denies hitting her head. On Saturday she stood up and took 2 steps and fell flat. She must have been unconscious for a while bc when she woke up EMS was there.  In the ED the patient was found to have no sensation in her low back, perineum. She is unable to stand or move her toes. She had lost control of bowel and bladder. He has a history of what sounded like radicular pain that has been evaluated by orthopedic surgery who told her that it was not due to her hips. She has had injections in her back early in the year. She states that her symptoms improved with physical therapy. She was transferred from [place] to [facility] as she was unable to tolerate an MRI at [facility]. The hope was that she could have an open MRI here.  She was admitted to a medical bed here. Neurology has been consulted. The patient has been started on a 5 day course of high dose IV steroids for either transverse myelitis, MS, or spinal chord stroke.  Consultants  . Neurology  Procedures  . Lumbar puncture  Antibiotics   Anti-infectives (From admission, onward)   None     .   Subjective  The patient states that she continues to have increased sensation in her perineum, buttocks and backs of her legs. She also states that he is able to wiggle her toes now.  Objective   Vitals:   Vitals:   02/15/19 0729 02/15/19 1100  BP: (!) 142/81 (P) 125/74  Pulse: 92 (P) 92  Resp: 18 (P) 18  Temp: 97.7 F (36.5 C) (P) 98 F (36.7 C)  SpO2: 100% (P) 100%    Exam:  Constitutional:  . The patient is awake, alert, and oriented x 3. No acute distress. Respiratory:  . No increased work of breathing. . No wheezes, rales, or rhonchi.  . No tactile fremitus. Cardiovascular:  . Regular rate and rhythm. . No LE extremity edema   . Normal pedal pulses Abdomen:  . Abdomen appears normal; no tenderness or masses . No hernias . No HSM Musculoskeletal:  . No cyanosis, clubbing or edema. Skin:  . No rashes, lesions, ulcers . palpation of skin: no induration or nodules Neurologic:  . CN 2-12 intact . Loss of sensation and motor to lower extremities and perineum. Psychiatric:  . Mental status o Mood, affect appropriate o Orientation to person, place, time  . judgment and insight appear intact    I have personally reviewed the following:   Today's Data  . BMP, CBC, Vitals  Scheduled Meds: . atorvastatin  20 mg Oral Daily  . insulin aspart  0-20 Units Subcutaneous TID WC  . LORazepam  0.5 mg Intravenous Once  . losartan  100 mg Oral q morning - 10a  .  polyethylene glycol  17 g Oral Daily  . sodium chloride flush  3 mL Intravenous Q12H   Continuous Infusions: . sodium chloride    . methylPREDNISolone (SOLU-MEDROL) injection 1,000 mg (02/15/19 1052)    Active Problems:   Type 2 diabetes mellitus without complication, without long-term current use of insulin (HCC)   Morbid obesity (HCC)   Cauda equina syndrome (HCC)   DM2 (diabetes mellitus, type 2) (HCC)   Syncope   Essential hypertension   LOS: 2 days    A & P   Cauda equina vs transverse myelitis, vs spinal chord CVA [facility])  Neurology has been consulted. And plan is for open MRI today. Neurosurgery contacted me to let me know that they are available, if needed, but that they thought that transverse  myelitis was most likely and that neurology would be the appropriate service for this diagnosis. I appreciate neurology's assistance. The patient has been started on high dose steroids. This will be a 5 day course.  Morbid obesity ([place]) Noted. Chronic and stable.  We will need nutritional consult as an outpatient.  Syncope: Occurred more than 24 hours ago with onset of paresthesia and weakness. Will observe on telemetry to see if there is any arrhythmias.  Check troponin and obtain echo.  Essential hypertension: Resume home medications when able to tolerate.  Constipation: Stool softeners ordered. Monitor.  DM 2 : Glucoses will be followed with FSBS and SSI. HBA1c was Order Sensitive SSI. TSH is normal and HbA1c is pending. Oral medications have been held. Her glucoses will be considerably higher due to high dose steroids. SSI intensity has been increased.  Leg edema on the left:  Pt states this has been the case since surgery in 1980, but has gotten worse lately. Doppler was negative for DVT.  I have seen and examined this patient myself. I have spent 32 minutes in her evaluation and care.  DVT prophylaxis:  SCD    Code Status:  FULL CODE  as per patient   Family Communication:   No family at bedside Disposition Plan:     likely will need placement for rehabilitation  [name] Puccinelli, DO [facility] Direct contact: see [email or web address]  7PM-7AM contact night coverage as above 02/14/2019, 6:33 PM  LOS: 1 day

## 2019-02-15 NOTE — Progress Notes (Signed)
Pt bladder scan 986 cc, x3 I&O cath. Dr. Hilbert Bible informed.

## 2019-02-15 NOTE — Progress Notes (Signed)
Pt d/t have indwelling cath placed. Spoke with charge nurse on duty and they stated to I&O cath pt this time and have cath placed during 1st shift d/t process of having placement approved and the urgency of having urine out of pt d/t discomfort. Will communicate this to oncoming nurse.

## 2019-02-15 NOTE — TOC Initial Note (Signed)
Transition of Care [facility]) - Initial/Assessment Note    Patient Details  Name: Laura Blackwell MRN: [id number] Date of Birth: 1956/03/17  [facility]) CM/SW Contact:    Pollie Friar, RN Phone Number: 02/15/2019, 1:39 PM  Clinical Narrative:                 Pt requesting hospital bed for home since she will need to stay on the ground level when returns home. CM asked for PT/OT to evaluate pt.  Pt wants to use [facility] for bed if she qualifies. TOC following.  Expected Discharge Plan: [place] Barriers to Discharge: Continued Medical Work up   Patient Goals and CMS Choice        Expected Discharge Plan and Services Expected Discharge Plan: [place]   Discharge Planning Services: CM Consult Post Acute Care Choice: Durable Medical Equipment Living arrangements for the past 2 months: Single Family Home(2 levels with bedroom on upper level)                                      Prior Living Arrangements/Services Living arrangements for the past 2 months: Single Family Home(2 levels with bedroom on upper level) Lives with:: Adult Children(son) Patient language and need for interpreter reviewed:: Yes(no needs) Do you feel safe going back to the place where you live?: Yes        Care giver support system in place?: Yes (comment)(pt states son and daughter can assist at home) Current home services: DME(cane, walker, 3 in 1) Criminal Activity/Legal Involvement Pertinent to Current Situation/Hospitalization: No - Comment as needed  Activities of Daily Living Home Assistive Devices/Equipment: Gilford Rile (specify type) ADL Screening (condition at time of admission) Patient's cognitive ability adequate to safely complete daily activities?: Yes Is the patient deaf or have difficulty hearing?: No Does the patient have difficulty seeing, even when wearing glasses/contacts?: No Does the patient have difficulty concentrating,  remembering, or making decisions?: No Patient able to express need for assistance with ADLs?: Yes Does the patient have difficulty dressing or bathing?: Yes Independently performs ADLs?: No Communication: Independent Dressing (OT): Needs assistance Is this a change from baseline?: Pre-admission baseline Grooming: Independent Feeding: Independent Bathing: Needs assistance Is this a change from baseline?: Pre-admission baseline Toileting: Needs assistance Is this a change from baseline?: Pre-admission baseline In/Out Bed: Needs assistance Is this a change from baseline?: Pre-admission baseline Walks in Home: Needs assistance Is this a change from baseline?: Pre-admission baseline Does the patient have difficulty walking or climbing stairs?: Yes Weakness of Legs: Both Weakness of Arms/Hands: None  Permission Sought/Granted                  Emotional Assessment Appearance:: Appears stated age Attitude/Demeanor/Rapport: Engaged Affect (typically observed): Accepting, Pleasant Orientation: : Oriented to Self, Oriented to Place, Oriented to  Time, Oriented to Situation   Psych Involvement: No (comment)  Admission diagnosis:  Fall, initial encounter [W19.XXXA] Abnormal imaging of central nervous system [R90.89] Patient Active Problem List   Diagnosis Date Noted  . Cauda equina syndrome ([place]) 02/13/2019  . DM2 (diabetes mellitus, type 2) ([place]) 02/13/2019  . Syncope 02/13/2019  . Essential hypertension 02/13/2019  . Class 3 severe obesity with serious comorbidity and body mass index (BMI) of 45.0 to 49.9 in adult ([place]) 10/04/2017  . Class 3 severe obesity without serious comorbidity with body  mass index (BMI) of 45.0 to 49.9 in adult [facility]) 04/12/2017  . Vitamin D deficiency 04/12/2017  . Other fatigue 12/31/2016  . SOB (shortness of breath) on exertion 12/31/2016  . Type 2 diabetes mellitus without complication, without long-term current use of insulin ([place]) 12/31/2016  .  Morbid obesity ([place]) 12/31/2016  . OA (osteoarthritis) of hip 12/02/2015   PCP:  Carol Ada, MD Pharmacy:   [id number] [address], [place] [address] Phone: [phone] Fax: [phone]     Social Determinants of Health (SDOH) Interventions    Readmission Risk Interventions No flowsheet data found.

## 2019-02-16 DIAGNOSIS — R9089 Other abnormal findings on diagnostic imaging of central nervous system: Secondary | ICD-10-CM

## 2019-02-16 LAB — GLUCOSE, CAPILLARY
Glucose-Capillary: 230 mg/dL — ABNORMAL HIGH (ref 70–99)
Glucose-Capillary: 239 mg/dL — ABNORMAL HIGH (ref 70–99)
Glucose-Capillary: 272 mg/dL — ABNORMAL HIGH (ref 70–99)
Glucose-Capillary: 294 mg/dL — ABNORMAL HIGH (ref 70–99)
Glucose-Capillary: 356 mg/dL — ABNORMAL HIGH (ref 70–99)

## 2019-02-16 LAB — OLIGOCLONAL BANDS, CSF + SERM

## 2019-02-16 MED ORDER — INSULIN GLARGINE 100 UNIT/ML ~~LOC~~ SOLN
15.0000 [IU] | Freq: Two times a day (BID) | SUBCUTANEOUS | Status: DC
  Administered 2019-02-16 – 2019-02-17 (×2): 15 [IU] via SUBCUTANEOUS
  Filled 2019-02-16 (×3): qty 0.15

## 2019-02-16 MED ORDER — INSULIN ASPART 100 UNIT/ML ~~LOC~~ SOLN
0.0000 [IU] | Freq: Three times a day (TID) | SUBCUTANEOUS | Status: DC
  Administered 2019-02-16: 8 [IU] via SUBCUTANEOUS
  Administered 2019-02-17: 15 [IU] via SUBCUTANEOUS
  Administered 2019-02-17: 5 [IU] via SUBCUTANEOUS
  Administered 2019-02-17: 8 [IU] via SUBCUTANEOUS
  Administered 2019-02-18: 5 [IU] via SUBCUTANEOUS
  Administered 2019-02-18 – 2019-02-19 (×3): 11 [IU] via SUBCUTANEOUS
  Administered 2019-02-19: 8 [IU] via SUBCUTANEOUS
  Administered 2019-02-19: 15 [IU] via SUBCUTANEOUS
  Administered 2019-02-20: 5 [IU] via SUBCUTANEOUS
  Administered 2019-02-20: 3 [IU] via SUBCUTANEOUS

## 2019-02-16 MED ORDER — INSULIN ASPART 100 UNIT/ML ~~LOC~~ SOLN
5.0000 [IU] | Freq: Three times a day (TID) | SUBCUTANEOUS | Status: DC
  Administered 2019-02-16 – 2019-02-20 (×12): 5 [IU] via SUBCUTANEOUS

## 2019-02-16 MED ORDER — GADOBUTROL 1 MMOL/ML IV SOLN
10.0000 mL | Freq: Once | INTRAVENOUS | Status: AC | PRN
  Administered 2019-02-16: 10 mL via INTRAVENOUS

## 2019-02-16 NOTE — Progress Notes (Signed)
Occupational Therapy Treatment Patient Details Name: Laura Blackwell MRN: [id number] DOB: 07/19/1956 Today's Date: 02/16/2019    History of present illness Laura Blackwell is a 63 y.o. female with medical history significant of, diabetes mellitus, HTN, PCOS, vitamin D deficiency, sp right hip replacement. Pt presenting for a rather sudden onset of bilateral lower extremity weakness that made her fall and gradually progressed to paralysis of her lower extremities which then started to improve over the last 1 or 2 days.  Neuro:  steroids. Primary differentials at this time are spinal cord infarct at the conus level versus transverse myelitis. Spinal tap performed: CSF examination none consistent with infection.   OT comments  Pt performing ADL functional mobility with RW and more stable today, no LOB episodes and sensation is improving. Pt performing ADL with set-upA sitting and standing at sink with fair balance. Pt performing LB ADL with bends and propping LE on chair in front of her. Pt progressing very well. Pt would benefit from continued OT skilled services for ADL, mobility and safety. HHOT for DME/AE and safety with ADL needs. OT following acutely.     Follow Up Recommendations  Home health OT;Supervision/Assistance - 24 hour    Equipment Recommendations  Hospital bed    Recommendations for Other Services      Precautions / Restrictions Precautions Precautions: Fall Restrictions Weight Bearing Restrictions: No       Mobility Bed Mobility Overal bed mobility: Needs Assistance Bed Mobility: Sidelying to Sit;Sit to Supine   Sidelying to sit: Min guard   Sit to supine: Min guard   General bed mobility comments: Patient found in a chair. She reports she needed just a little help getting out of bed   Transfers Overall transfer level: Needs assistance Equipment used: Rolling walker (2 wheeled) Transfers: Sit to/from Stand Sit to Stand: Min assist         General  transfer comment: Sit to stand 2x with min a for strength and stability     Balance Overall balance assessment: Needs assistance   Sitting balance-Leahy Scale: Good     Standing balance support: Bilateral upper extremity supported Standing balance-Leahy Scale: Poor Standing balance comment: no lob with gait but reliant on the walker                            ADL either performed or assessed with clinical judgement   ADL Overall ADL's : Needs assistance/impaired     Grooming: Wash/dry hands;Wash/dry face;Oral care;Applying deodorant;Brushing hair;Set up;Sitting   Upper Body Bathing: Set up;Sitting   Lower Body Bathing: Minimal assistance;Sitting/lateral leans   Upper Body Dressing : Set up;Sitting                   Functional mobility during ADLs: Minimal assistance;Rolling walker;Cueing for safety       Vision       Perception     Praxis      Cognition Arousal/Alertness: Awake/alert Behavior During Therapy: WFL for tasks assessed/performed Overall Cognitive Status: Within Functional Limits for tasks assessed                                          Exercises     Shoulder Instructions       General Comments Pt progressing well and stood and sat at sink for entire ADL  routine with set-upA    Pertinent Vitals/ Pain       Pain Assessment: No/denies pain  Home Living Family/patient expects to be discharged to:: Private residence Living Arrangements: Children Available Help at Discharge: Family;Available 24 hours/day Type of Home: House Home Access: Stairs to enter [employer] of Steps: 5 Entrance Stairs-Rails: Can reach both Home Layout: Two level;1/2 bath on main level Alternate Level Stairs-Number of Steps: 16   Bathroom Shower/Tub: Occupational psychologist: Handicapped height     Home Equipment: Environmental consultant - 2 wheels;Bedside commode;Grab bars - toilet;Grab bars - tub/shower          Prior  Functioning/Environment Level of Independence: Independent        Comments: Patient was working 2 jobs and very active    Geologist, engineering 2X/week        Progress Toward Goals  OT Goals(current goals can now be found in the care plan section)  Progress towards OT goals: Progressing toward goals  Acute Rehab OT Goals Patient Stated Goal: to go home OT Goal Formulation: With patient Time For Goal Achievement: 03/01/19 Potential to Achieve Goals: Good ADL Goals Pt Will Perform Grooming: with modified independence;standing Pt Will Perform Upper Body Dressing: with modified independence;standing Pt Will Perform Lower Body Dressing: with modified independence;sitting/lateral leans;sit to/from stand Pt Will Perform Toileting - Clothing Manipulation and hygiene: with set-up;sitting/lateral leans;sit to/from stand Additional ADL Goal #1: Pt will perform OOB ADL with set-upA  with fair balance in standing x5 mins  Plan Discharge plan remains appropriate    Co-evaluation                 AM-PAC OT "6 Clicks" Daily Activity     Outcome Measure   Help from another person eating meals?: None Help from another person taking care of personal grooming?: None Help from another person toileting, which includes using toliet, bedpan, or urinal?: A Lot Help from another person bathing (including washing, rinsing, drying)?: A Lot Help from another person to put on and taking off regular upper body clothing?: A Little Help from another person to put on and taking off regular lower body clothing?: A Lot 6 Click Score: 17    End of Session Equipment Utilized During Treatment: Gait belt;Rolling walker  OT Visit Diagnosis: Unsteadiness on feet (R26.81);Muscle weakness (generalized) (M62.81);Other abnormalities of gait and mobility (R26.89)   Activity Tolerance Patient tolerated treatment well   Patient Left in bed;with call bell/phone within reach;with bed alarm set   Nurse  Communication Mobility status        Time: 7616-0737 OT Time Calculation (min): 51 min  Charges: OT General Charges $OT Visit: 1 Visit OT Treatments $Self Care/Home Management : 23-37 mins $Neuromuscular Re-education: 8-22 mins  Darryl Nestle) Marsa Aris OTR/L Acute Rehabilitation Services Pager: [phone] Office: [phone]    Audie Pinto 02/16/2019, 4:15 PM

## 2019-02-16 NOTE — Progress Notes (Signed)
Neurology Progress Note   S:// Brain and C-spine MRI with and without contrast completed and reviewed by me.  Negative for demyelinating lesions. CSF cytopathology negative for malignant cells.   O:// Current vital signs: BP 124/70 (BP Location: Left Arm)   Pulse 89   Temp 98.4 F (36.9 C) (Oral)   Resp 18   Ht 5\' 7"  (1.702 m)   Wt (!) 136.5 kg   SpO2 97%   BMI 47.13 kg/m  Vital signs in last 24 hours: Temp:  [97.5 F (36.4 C)-98.4 F (36.9 C)] 98.4 F (36.9 C) (06/18 1606) Pulse Rate:  [78-108] 89 (06/18 1606) Resp:  [18] 18 (06/18 1606) BP: (118-140)/(70-87) 124/70 (06/18 1606) SpO2:  [92 %-99 %] 97 % (06/18 1606) Neurological exam Awake alert oriented x3 Speech non-dysarthric No aphasia Pupils equal round reactive to light extraocular movements intact visual fields full face symmetric tongue and palate midline Both upper extremities 5/5 Hip flexors 4/5 consistently today-with some limitation due to pain. Knee flexors 4 to 4+/5 bilaterally.  Plantar dorsiflexors 4/5 Sensory exam: Level around T12 with some improvement in sensation but still less on the right compared to the left.  Improvement in saddle anesthesia as evidenced by more sensation while receiving cleaning during patient care. Still continuing to have bowel incontinence.  Foley in place for the bladder. Coordination: Intact finger-nose-finger DTRs: Absent on the left lower extremity, 1+ biceps bilaterally, 1+ right ankle and knee jerks.   Medications  Current Facility-Administered Medications:  .  0.9 %  sodium chloride infusion, 250 mL, Intravenous, PRN, Doutova, Anastassia, MD .  acetaminophen (TYLENOL) tablet 650 mg, 650 mg, Oral, Q6H PRN **OR** acetaminophen (TYLENOL) suppository 650 mg, 650 mg, Rectal, Q6H PRN, Doutova, Anastassia, MD .  ALPRAZolam Duanne Moron) tablet 1 mg, 1 mg, Oral, PRN, Roel Cluck, Anastassia, MD, 1 mg at 02/15/19 2122 .  atorvastatin (LIPITOR) tablet 20 mg, 20 mg, Oral, Daily, Doutova,  Anastassia, MD, 20 mg at 02/16/19 0905 .  HYDROcodone-acetaminophen (NORCO/VICODIN) 5-325 MG per tablet 1-2 tablet, 1-2 tablet, Oral, Q4H PRN, Toy Baker, MD, 1 tablet at 02/14/19 0950 .  insulin aspart (novoLOG) injection 0-15 Units, 0-15 Units, Subcutaneous, TID WC, Swayze, Ava, DO, 8 Units at 02/16/19 1751 .  insulin aspart (novoLOG) injection 5 Units, 5 Units, Subcutaneous, TID WC, Swayze, Ava, DO, 5 Units at 02/16/19 1752 .  insulin glargine (LANTUS) injection 15 Units, 15 Units, Subcutaneous, BID, Swayze, Ava, DO .  LORazepam (ATIVAN) injection 0.5 mg, 0.5 mg, Intravenous, Once, Doutova, Anastassia, MD .  losartan (COZAAR) tablet 100 mg, 100 mg, Oral, q morning - 10a, Doutova, Anastassia, MD, 100 mg at 02/16/19 0904 .  methylPREDNISolone sodium succinate (SOLU-MEDROL) 1,000 mg in sodium chloride 0.9 % 50 mL IVPB, 1,000 mg, Intravenous, Daily, Amie Portland, MD, Last Rate: 58 mL/hr at 02/16/19 0908, 1,000 mg at 02/16/19 0908 .  ondansetron (ZOFRAN) tablet 4 mg, 4 mg, Oral, Q6H PRN **OR** ondansetron (ZOFRAN) injection 4 mg, 4 mg, Intravenous, Q6H PRN, Doutova, Anastassia, MD .  polyethylene glycol (MIRALAX / GLYCOLAX) packet 17 g, 17 g, Oral, Daily, Swayze, Ava, DO, 17 g at 02/16/19 0905 .  sodium chloride flush (NS) 0.9 % injection 3 mL, 3 mL, Intravenous, Q12H, Doutova, Anastassia, MD, 3 mL at 02/16/19 0910 .  sodium chloride flush (NS) 0.9 % injection 3 mL, 3 mL, Intravenous, PRN, Doutova, Anastassia, MD Labs CBC    Component Value Date/Time   WBC 7.9 02/15/2019 0454   RBC 4.73 02/15/2019 0454   HGB  12.7 02/15/2019 0454   HGB 13.3 12/31/2016 1032   HCT 39.6 02/15/2019 0454   HCT 40.2 12/31/2016 1032   PLT 180 02/15/2019 0454   PLT 223 12/31/2016 1032   MCV 83.7 02/15/2019 0454   MCV 80 12/31/2016 1032   MCH 26.8 02/15/2019 0454   MCHC 32.1 02/15/2019 0454   RDW 14.7 02/15/2019 0454   RDW 16.3 (H) 12/31/2016 1032   LYMPHSABS 1.3 02/15/2019 0454   LYMPHSABS 1.2  12/31/2016 1032   MONOABS 0.6 02/15/2019 0454   EOSABS 0.1 02/15/2019 0454   EOSABS 0.1 12/31/2016 1032   BASOSABS 0.0 02/15/2019 0454   BASOSABS 0.0 12/31/2016 1032    CMP     Component Value Date/Time   NA 139 02/15/2019 0454   NA 141 05/04/2017 0818   K 3.7 02/15/2019 0454   CL 105 02/15/2019 0454   CO2 24 02/15/2019 0454   GLUCOSE 114 (H) 02/15/2019 0454   BUN 11 02/15/2019 0454   BUN 11 05/04/2017 0818   CREATININE 0.54 02/15/2019 0454   CALCIUM 9.5 02/15/2019 0454   PROT 6.2 (L) 02/14/2019 0809   PROT 6.9 05/04/2017 0818   ALBUMIN 3.1 (L) 02/14/2019 0809   ALBUMIN 4.1 05/04/2017 0818   AST 42 (H) 02/14/2019 0809   ALT 26 02/14/2019 0809   ALKPHOS 57 02/14/2019 0809   BILITOT 1.2 02/14/2019 0809   BILITOT 0.2 05/04/2017 0818   GFRNONAA >60 02/15/2019 0454   GFRAA >60 02/15/2019 0454    glycosylated hemoglobin  Lipid Panel     Component Value Date/Time   CHOL 148 12/31/2016 1032   TRIG 61 12/31/2016 1032   HDL 58 12/31/2016 1032   LDLCALC 78 12/31/2016 1032     Imaging As in the prior note  Assessment: 63 year old woman past medical history of obesity, arthritis, bilateral hip replacement, diabetes, hypertension presenting with sudden onset of bilateral lower extremity weakness that made her fall-this weakness progressed to near complete paralysis and then resolution over a few days. Exam consistent with a lower thoracic/upper lumbar spinal cord/conus medullaris lesion and MRI confirms a conus medullaris lesion Top differentials at this time remain transverse myelitis versus spinal cord infarct. CSF examination is not consistent with infection. CSF IgG index and oligoclonal bands are pending No evidence of malignancy on CSF evaluation   Impression Bilateral leg weakness-secondary to conus medullaris lesion which is more likely to be a spinal cord infarct but another strong differential still until further work-up returns is transverse  myelitis/demyelination.  Recommendations: Continue 5 days of IV steroids-methylprednisolone 1 g daily Monitor blood sugars PT OT Appreciate social work assistance for [employer] paperwork Appreciate primary team medical management of the patient Will follow.   -- Amie Portland, MD [facility] Pager: [phone] If 7pm to 7am, please call on call as listed on AMION.

## 2019-02-16 NOTE — Progress Notes (Signed)
PROGRESS NOTE  HIBO BLASDELL KGU:[id number] DOB: June 07, 1956 DOA: 02/13/2019 PCP: Carol Ada, MD  Brief History   Laura Blackwell is a 63 y.o. female with medical history significant of, diabetes mellitus, HTN, PCOS, vitamin D deficiency, sp right hip replacement   The patient is a 63 yr old woman who presented with lower back pain and weakness. She states that she had a fall on Friday 4 days ago at work due to significant weakness in her lower extremities but time she woke up on Saturday she had numbness on both buttocks and down to the back of her legs worse on the left.  She has been having significant low back pain.Denies hitting her head. On Saturday she stood up and took 2 steps and fell flat. She must have been unconscious for a while bc when she woke up EMS was there.  In the ED the patient was found to have no sensation in her low back, perineum. She is unable to stand or move her toes. She had lost control of bowel and bladder. He has a history of what sounded like radicular pain that has been evaluated by orthopedic surgery who told her that it was not due to her hips. She has had injections in her back early in the year. She states that her symptoms improved with physical therapy. She was transferred from [place] to [facility] as she was unable to tolerate an MRI at [facility]. The hope was that she could have an open MRI here.  She was admitted to a medical bed here. Neurology has been consulted. The patient has been started on a 5 day course of high dose IV steroids for either transverse myelitis, MS, or spinal chord stroke.  Consultants  . Neurology  Procedures  . Lumbar puncture  Antibiotics   Anti-infectives (From admission, onward)   None     .   Subjective  The patient states that she continues to have increased sensation in her perineum, buttocks and backs of her legs. She also states that she has increased strength in her lower extremities. She was  able to stand with PT yesterday.  Objective   Vitals:  Vitals:   02/16/19 0832 02/16/19 1215  BP: 122/73 140/72  Pulse: 84 95  Resp: 18 18  Temp: 97.7 F (36.5 C) 98.2 F (36.8 C)  SpO2: 99% 99%    Exam:  Constitutional:  . The patient is awake, alert, and oriented x 3. No acute distress. Respiratory:  . No increased work of breathing. . No wheezes, rales, or rhonchi.  . No tactile fremitus. Cardiovascular:  . Regular rate and rhythm. . No LE extremity edema   . Normal pedal pulses Abdomen:  . Abdomen appears normal; no tenderness or masses . No hernias . No HSM Musculoskeletal:  . No cyanosis, clubbing or edema. Skin:  . No rashes, lesions, ulcers . palpation of skin: no induration or nodules Neurologic:  . CN 2-12 intact . Improving sensation and motor strength in lower extremities, back, and perineum. Psychiatric:  . Mental status o Mood, affect appropriate o Orientation to person, place, time  . judgment and insight appear intact   I have personally reviewed the following:   Today's Data  . BMP, CBC, Vitals  Scheduled Meds: . atorvastatin  20 mg Oral Daily  . insulin aspart  0-20 Units Subcutaneous TID WC  . insulin glargine  12 Units Subcutaneous Daily  . LORazepam  0.5 mg Intravenous Once  .  losartan  100 mg Oral q morning - 10a  . polyethylene glycol  17 g Oral Daily  . sodium chloride flush  3 mL Intravenous Q12H   Continuous Infusions: . sodium chloride    . methylPREDNISolone (SOLU-MEDROL) injection 1,000 mg (02/16/19 0908)    Active Problems:   Type 2 diabetes mellitus without complication, without long-term current use of insulin (HCC)   Morbid obesity (HCC)   Cauda equina syndrome (HCC)   DM2 (diabetes mellitus, type 2) (HCC)   Syncope   Essential hypertension   LOS: 3 days    A & P   Cauda equina vs transverse myelitis, vs spinal chord CVA [facility])  Neurology has been consulted. And plan is for open MRI today. Neurosurgery  contacted me to let me know that they are available, if needed, but that they thought that transverse myelitis was most likely and that neurology would be the appropriate service for this diagnosis. I appreciate neurology's assistance. The patient has been started on high dose steroids. This will be a 5 day course. She states that she is feeling much better today.  Morbid obesity ([place]) Noted. Chronic and stable.  We will need nutritional consult as an outpatient.  Syncope: Occurred more than 24 hours ago with onset of paresthesia and weakness. Will observe on telemetry to see if there is any arrhythmias.  Check troponin and obtain echo.  Essential hypertension: Resume home medications when able to tolerate.  Constipation: Stool softeners ordered. Monitor.  DM 2 : Glucoses will be followed with FSBS and SSI. HBA1c was Order Sensitive SSI. TSH is normal and HbA1c is pending. Oral medications have been held. Her glucoses will be considerably higher due to high dose steroids. SSI intensity has been increased.  Leg edema on the left:  Pt states this has been the case since surgery in 1980, but has gotten worse lately. Doppler was negative for DVT.  I have seen and examined this patient myself. I have spent 30 minutes in her evaluation and care.  DVT prophylaxis:  SCD    Code Status:  FULL CODE  as per patient   Family Communication:   No family at bedside Disposition Plan:     likely will need placement for rehabilitation  Bryna Razavi, DO [facility] Direct contact: see [email or web address]  7PM-7AM contact night coverage as above 02/14/2019, 6:33 PM  LOS: 1 day

## 2019-02-16 NOTE — Progress Notes (Signed)
Inpatient Diabetes Program Recommendations  AACE/ADA: New Consensus Statement on Inpatient Glycemic Control (2015)  Target Ranges:  Prepandial:   less than 140 mg/dL      Peak postprandial:   less than 180 mg/dL (1-2 hours)      Critically ill patients:  140 - 180 mg/dL   Lab Results  Component Value Date   GLUCAP 272 (H) 02/16/2019   HGBA1C 7.5 (H) 02/14/2019    Review of Glycemic Control Results for Laura Blackwell, Laura Blackwell (MRN [id number]) as of 02/16/2019 13:58  Ref. Range 02/15/2019 16:38 02/15/2019 19:58 02/16/2019 03:30 02/16/2019 06:04 02/16/2019 12:12  Glucose-Capillary Latest Ref Range: 70 - 99 mg/dL 333 (H) 256 (H) 230 (H) 239 (H) 272 (H)   Diabetes history: DM2 Outpatient Diabetes medications: Soliqua 32 units daily + Amaryl 2 mg bid + Metformin 1 gm bid Current orders for Inpatient glycemic control: Lantus 12 units + Novolog correction 0-20 units tid  Inpatient Diabetes Program Recommendations:   -LAGT 364 and 272 today with above regimen and steroids. -Increase Lantus to 15 units bid -Add Novolog 5 units tid meal coverage if eats 50% -Decrease Novolog correction to moderate tid + hs 0-5 units  Thank you, Bethena Roys E. Manpreet Strey, RN, MSN, CDE  Diabetes Coordinator Inpatient Glycemic Control Team Team Pager [phone] (8am-5pm) 02/16/2019 2:03 PM

## 2019-02-16 NOTE — Plan of Care (Signed)
  Problem: Education: Goal: Knowledge of General Education information will improve Description: Including pain rating scale, medication(s)/side effects and non-pharmacologic comfort measures Outcome: Progressing   Problem: Health Behavior/Discharge Planning: Goal: Ability to manage health-related needs will improve Outcome: Progressing   Problem: Clinical Measurements: Goal: Ability to maintain clinical measurements within normal limits will improve Outcome: Progressing Goal: Will remain free from infection Outcome: Progressing Goal: Diagnostic test results will improve Outcome: Progressing Goal: Respiratory complications will improve Outcome: Progressing Goal: Cardiovascular complication will be avoided Outcome: Progressing   Problem: Activity: Goal: Risk for activity intolerance will decrease Outcome: Progressing   Problem: Nutrition: Goal: Adequate nutrition will be maintained Outcome: Progressing   Problem: Coping: Goal: Level of anxiety will decrease Outcome: Progressing   Problem: Elimination: Goal: Will not experience complications related to bowel motility Outcome: Progressing Goal: Will not experience complications related to urinary retention Outcome: Progressing   Problem: Pain Managment: Goal: General experience of comfort will improve Outcome: Progressing   Problem: Safety: Goal: Ability to remain free from injury will improve Outcome: Progressing   Problem: Skin Integrity: Goal: Risk for impaired skin integrity will decrease Outcome: Progressing   Problem: Activity: Goal: Ability to perform activities at highest level will improve Outcome: Progressing Goal: Muscle strength will improve Outcome: Progressing   Problem: Bowel/Gastric: Goal: Ability to demonstrate the techniques of an individualized bowel program will improve Outcome: Progressing   Problem: Education: Goal: Knowledge of disease or condition will improve Outcome:  Progressing Goal: Knowledge of the prescribed therapeutic regimen will improve Outcome: Progressing   Problem: Coping: Goal: Ability to identify and develop effective coping behavior will improve Outcome: Progressing Goal: Ability to verbalize feelings will improve Outcome: Progressing   Problem: Self-Care: Goal: Ability to participate in self-care as condition permits will improve Outcome: Progressing   Problem: Skin Integrity: Goal: Risk for impaired skin integrity will decrease Outcome: Progressing   Problem: Urinary Elimination: Goal: Ability to achieve a regular elimination pattern will improve Outcome: Progressing   Ival Bible, BSN, RN

## 2019-02-16 NOTE — Progress Notes (Signed)
Pt back on unit at this time

## 2019-02-16 NOTE — Progress Notes (Signed)
Physical Therapy Evaluation Patient Details Name: Laura Blackwell MRN: [id number] DOB: 1956-03-24 Today's Date: 02/16/2019   History of Present Illness  Laura Blackwell is a 63 y.o. female with medical history significant of, diabetes mellitus, HTN, PCOS, vitamin D deficiency, sp right hip replacement. Pt presenting for a rather sudden onset of bilateral lower extremity weakness that made her fall and gradually progressed to paralysis of her lower extremities which then started to improve over the last 1 or 2 days.  Neuro:  steroids. Primary differentials at this time are spinal cord infarct at the conus level versus transverse myelitis. Spinal tap performed: CSF examination none consistent with infection.  Clinical Impression  Patient presents with decreased endurance with gait. She required min a to stand. She is very motivated to improve. She has equipment at home. She would benefit from a hospital bed. Her only bed is upstairs. She would benefit from a hospital bed to improve her ability to perform independent bed mobility. The patient would benefit from further acute therapy for gait training and stair training.     Follow Up Recommendations Home health PT    Equipment Recommendations  Hospital bed(only has a bed upstairs )    Recommendations for Other Services Rehab consult     Precautions / Restrictions Precautions Precautions: Fall Restrictions Weight Bearing Restrictions: No      Mobility  Bed Mobility Overal bed mobility: Needs Assistance             General bed mobility comments: Patient found in a chair. She reports she needed just a little help getting out of bed   Transfers Overall transfer level: Needs assistance Equipment used: Rolling walker (2 wheeled) Transfers: Sit to/from Stand Sit to Stand: Min assist         General transfer comment: Sit to stand 2x with min a for strength and stability   Ambulation/Gait Ambulation/Gait assistance: Min  guard Gait Distance (Feet): 70 Feet Assistive device: Rolling walker (2 wheeled) Gait Pattern/deviations: Step-to pattern Gait velocity: decreased   General Gait Details: slow step to pattern. Decreased step length as she walks further.  Stairs            Wheelchair Mobility    Modified Rankin (Stroke Patients Only)       Balance Overall balance assessment: Needs assistance   Sitting balance-Leahy Scale: Good     Standing balance support: Bilateral upper extremity supported Standing balance-Leahy Scale: Poor Standing balance comment: no lob with gait but reliant on the walker                              Pertinent Vitals/Pain Pain Assessment: No/denies pain    Home Living Family/patient expects to be discharged to:: Private residence Living Arrangements: Children Available Help at Discharge: Family;Available 24 hours/day Type of Home: House Home Access: Stairs to enter Entrance Stairs-Rails: Can reach both Entrance Stairs-Number of Steps: 5 Home Layout: Two level;1/2 bath on main level Home Equipment: Walker - 2 wheels;Bedside commode;Grab bars - toilet;Grab bars - tub/shower      Prior Function Level of Independence: Independent         Comments: Patient was working 2 jobs and very active      Journalist, newspaper        Extremity/Trunk Assessment   Upper Extremity Assessment Upper Extremity Assessment: Defer to OT evaluation    Lower Extremity Assessment Lower Extremity Assessment: Generalized weakness RLE Deficits /  Details: (5/5 gross ) LLE Deficits / Details: (5/5 gross )    Cervical / Trunk Assessment Cervical / Trunk Assessment: Other exceptions  Communication   Communication: No difficulties  Cognition Arousal/Alertness: Awake/alert Behavior During Therapy: WFL for tasks assessed/performed Overall Cognitive Status: Within Functional Limits for tasks assessed                                        General  Comments      Exercises     Assessment/Plan    PT Assessment Patient needs continued PT services  PT Problem List Decreased strength;Decreased activity tolerance;Decreased mobility;Decreased knowledge of use of DME;Decreased safety awareness;Decreased balance;Decreased coordination       PT Treatment Interventions DME instruction;Gait training;Functional mobility training;Therapeutic activities;Therapeutic exercise;Neuromuscular re-education;Patient/family education    PT Goals (Current goals can be found in the Care Plan section)  Acute Rehab PT Goals Patient Stated Goal: to go home PT Goal Formulation: With patient Potential to Achieve Goals: Good    Frequency Min 3X/week   Barriers to discharge        Co-evaluation               AM-PAC PT "6 Clicks" Mobility  Outcome Measure Help needed turning from your back to your side while in a flat bed without using bedrails?: A Little Help needed moving from lying on your back to sitting on the side of a flat bed without using bedrails?: A Little Help needed moving to and from a bed to a chair (including a wheelchair)?: A Little Help needed standing up from a chair using your arms (e.g., wheelchair or bedside chair)?: A Little Help needed to walk in hospital room?: A Little Help needed climbing 3-5 steps with a railing? : A Little 6 Click Score: 18    End of Session Equipment Utilized During Treatment: Gait belt Activity Tolerance: Patient tolerated treatment well Patient left: in chair;with call bell/phone within reach;with chair alarm set Nurse Communication: Mobility status PT Visit Diagnosis: Unsteadiness on feet (R26.81);Other abnormalities of gait and mobility (R26.89);Muscle weakness (generalized) (M62.81)    Time: 1335-1410 PT Time Calculation (min) (ACUTE ONLY): 35 min   Charges:   PT Evaluation $PT Eval Moderate Complexity: 1 Mod            Carney Living PT DPT  02/16/2019, 3:31 PM

## 2019-02-17 LAB — GLUCOSE, CAPILLARY
Glucose-Capillary: 254 mg/dL — ABNORMAL HIGH (ref 70–99)
Glucose-Capillary: 233 mg/dL — ABNORMAL HIGH (ref 70–99)
Glucose-Capillary: 386 mg/dL — ABNORMAL HIGH (ref 70–99)
Glucose-Capillary: 353 mg/dL — ABNORMAL HIGH (ref 70–99)

## 2019-02-17 LAB — CSF CULTURE W GRAM STAIN: Culture: NO GROWTH

## 2019-02-17 LAB — NEUROMYELITIS OPTICA AUTOAB, IGG: NMO-IgG: <1.5 U/mL (ref 0.0–3.0)

## 2019-02-17 MED ORDER — INSULIN GLARGINE 100 UNIT/ML ~~LOC~~ SOLN
25.0000 [IU] | Freq: Two times a day (BID) | SUBCUTANEOUS | Status: DC
  Administered 2019-02-17 – 2019-02-18 (×2): 25 [IU] via SUBCUTANEOUS
  Filled 2019-02-17 (×3): qty 0.25

## 2019-02-17 NOTE — Progress Notes (Signed)
PROGRESS NOTE  Laura Blackwell ZOX:[id number] DOB: 23-Jul-1956 DOA: 02/13/2019 PCP: Carol Ada, MD  Brief History   Laura Blackwell is a 63 y.o. female with medical history significant of, diabetes mellitus, HTN, PCOS, vitamin D deficiency, sp right hip replacement   The patient is a 63 yr old woman who presented with lower back pain and weakness. She states that she had a fall on Friday 4 days ago at work due to significant weakness in her lower extremities but time she woke up on Saturday she had numbness on both buttocks and down to the back of her legs worse on the left.  She has been having significant low back pain.Denies hitting her head. On Saturday she stood up and took 2 steps and fell flat. She must have been unconscious for a while bc when she woke up EMS was there.  In the ED the patient was found to have no sensation in her low back, perineum. She is unable to stand or move her toes. She had lost control of bowel and bladder. He has a history of what sounded like radicular pain that has been evaluated by orthopedic surgery who told her that it was not due to her hips. She has had injections in her back early in the year. She states that her symptoms improved with physical therapy. She was transferred from [place] to [facility] as she was unable to tolerate an MRI at [facility]. The hope was that she could have an open MRI here.  She was admitted to a medical bed here. Neurology has been consulted. The patient has been started on a 5 day course of high dose IV steroids for either transverse myelitis, MS, or spinal cord CVA. Due to the patient's risk factors neurology favors CVA.  Consultants  . Neurology  Procedures  . Lumbar puncture  Antibiotics   Anti-infectives (From admission, onward)   None     .   Subjective  The patient states that she continues to improve and has been able to ambulate a short distance with assistance. Sensation is also improving.   Objective   Vitals:  Vitals:   02/17/19 0807 02/17/19 1158  BP: 130/82 121/67  Pulse: 84 89  Resp: 16 16  Temp: (!) 97.3 F (36.3 C) 97.8 F (36.6 C)  SpO2: 100% 95%    Exam:  Constitutional:  . The patient is awake, alert, and oriented x 3. No acute distress. Respiratory:  . No increased work of breathing. . No wheezes, rales, or rhonchi.  . No tactile fremitus. Cardiovascular:  . Regular rate and rhythm. . No LE extremity edema   . Normal pedal pulses Abdomen:  . Abdomen appears normal; no tenderness or masses . No hernias . No HSM Musculoskeletal:  . No cyanosis, clubbing or edema. Skin:  . No rashes, lesions, ulcers . palpation of skin: no induration or nodules Neurologic:  . CN 2-12 intact . Improving sensation and motor strength in lower extremities, back, and perineum. Psychiatric:  . Mental status o Mood, affect appropriate o Orientation to person, place, time  . judgment and insight appear intact   I have personally reviewed the following:   Today's Data  . BMP, CBC, Vitals  Scheduled Meds: . atorvastatin  20 mg Oral Daily  . insulin aspart  0-15 Units Subcutaneous TID WC  . insulin aspart  5 Units Subcutaneous TID WC  . insulin glargine  15 Units Subcutaneous BID  . LORazepam  0.5  mg Intravenous Once  . losartan  100 mg Oral q morning - 10a  . polyethylene glycol  17 g Oral Daily  . sodium chloride flush  3 mL Intravenous Q12H   Continuous Infusions: . sodium chloride    . methylPREDNISolone (SOLU-MEDROL) injection 58 mL/hr at 02/17/19 1454    Active Problems:   Type 2 diabetes mellitus without complication, without long-term current use of insulin (HCC)   Morbid obesity (HCC)   Cauda equina syndrome (HCC)   DM2 (diabetes mellitus, type 2) ([place])   Syncope   Essential hypertension   LOS: 61 days    A & P   Cauda equina vs transverse myelitis, vs spinal chord CVA [facility])  Neurology has been consulted. And plan is for open MRI today.  Neurosurgery contacted me to let me know that they are available, if needed, but that they thought that transverse myelitis was most likely and that neurology would be the appropriate service for this diagnosis. I appreciate neurology's assistance. The patient has been started on high dose steroids. This will be a 5 day course. She states that she is continuing to have improved sensation and motor strength each day. Plans are being made for discharge to home. I have ordered a hospital bed for her as well as home health PT/OT.  Morbid obesity ([place]) Noted. Chronic and stable.  We will need nutritional consult as an outpatient.  Syncope: Occurred more than 24 hours ago with onset of paresthesia and weakness. Will observe on telemetry to see if there is any arrhythmias.    Essential hypertension: Resume home medications when able to tolerate.  Constipation: Stool softeners ordered. Monitor.  DM 2 : Glucoses will be followed with FSBS and SSI. HBA1c was Order Sensitive SSI. TSH is normal and HbA1c is pending. Oral medications have been held. Her glucoses will be considerably higher due to high dose steroids. SSI intensity has been increased.  Leg edema on the left:  Pt states this has been the case since surgery in 1980, but has gotten worse lately. Doppler was negative for DVT.  I have seen and examined this patient myself. I have spent 32 minutes in her evaluation and care.  DVT prophylaxis:  SCD    Code Status:  FULL CODE  as per patient   Family Communication:   No family at bedside Disposition Plan:     likely will need placement for rehabilitation  Laura Heyer, DO [facility] Direct contact: see [email or web address]  7PM-7AM contact night coverage as above 02/17/2019, 5:03 PM  LOS: 1 day

## 2019-02-17 NOTE — Progress Notes (Signed)
Patient suffers from chronic low back pain which is caused by obesity/bil le weakness.  Hospital bed will alleviate pain by allowing trunk to be positioned in ways not feasible with a normal bed.  Pain episodes frequently require frequent an immediate changes in body position which can not be achieved with a normal bed.

## 2019-02-17 NOTE — Progress Notes (Signed)
Physical Therapy Treatment Patient Details Name: Laura Blackwell MRN: [id number] DOB: 1956-05-28 Today's Date: 02/17/2019    History of Present Illness Laura Blackwell is a 63 y.o. female with medical history significant of, diabetes mellitus, HTN, PCOS, vitamin D deficiency, sp right hip replacement. Pt presenting for a rather sudden onset of bilateral lower extremity weakness that made her fall and gradually progressed to paralysis of her lower extremities which then started to improve over the last 1 or 2 days.  Neuro:  steroids. Primary differentials at this time are spinal cord infarct at the conus level versus transverse myelitis. Spinal tap performed: CSF examination none consistent with infection.    PT Comments    Patient able to safely go up and down steps with supervision. Session cut short 2nd to therapy needing to assist in another transfer rapidly but she was able to complete her tasks. Her bed mobility improved and she transferd sit to stand 3x with Ming guard. The patient is progressing very well.    Follow Up Recommendations  Home health PT     Equipment Recommendations  Hospital bed(only has a bed upstairs )    Recommendations for Other Services Rehab consult     Precautions / Restrictions Precautions Precautions: Fall Restrictions Weight Bearing Restrictions: No    Mobility  Bed Mobility Overal bed mobility: Needs Assistance Bed Mobility: Sidelying to Sit;Sit to Supine   Sidelying to sit: Min guard   Sit to supine: Min guard   General bed mobility comments: Patient found in a chair. She reports she needed just a little help getting out of bed   Transfers Overall transfer level: Needs assistance Equipment used: Rolling walker (2 wheeled) Transfers: Sit to/from Stand Sit to Stand: Min assist         General transfer comment: Sit to stand 2x with min a for strength and stability   Ambulation/Gait Ambulation/Gait assistance: Min guard Gait  Distance (Feet): 15 Feet Assistive device: Rolling walker (2 wheeled) Gait Pattern/deviations: Step-to pattern         Stairs Stairs: Yes Stairs assistance: Supervision Stair Management: Two rails Number of Stairs: 8 General stair comments: 8 steps with supervision. No faituge reported.    Wheelchair Mobility    Modified Rankin (Stroke Patients Only)       Balance Overall balance assessment: Needs assistance   Sitting balance-Leahy Scale: Good     Standing balance support: Bilateral upper extremity supported Standing balance-Leahy Scale: Poor Standing balance comment: no lob with gait but reliant on the walker                             Cognition Arousal/Alertness: Awake/alert Behavior During Therapy: WFL for tasks assessed/performed Overall Cognitive Status: Within Functional Limits for tasks assessed                                        Exercises      General Comments        Pertinent Vitals/Pain Pain Assessment: No/denies pain    Home Living                      Prior Function            PT Goals (current goals can now be found in the care plan section) Acute Rehab PT Goals Patient Stated  Goal: to go home PT Goal Formulation: With patient Potential to Achieve Goals: Good    Frequency    Min 3X/week      PT Plan Current plan remains appropriate    Co-evaluation              AM-PAC PT "6 Clicks" Mobility   Outcome Measure  Help needed turning from your back to your side while in a flat bed without using bedrails?: A Little Help needed moving from lying on your back to sitting on the side of a flat bed without using bedrails?: A Little Help needed moving to and from a bed to a chair (including a wheelchair)?: A Little Help needed standing up from a chair using your arms (e.g., wheelchair or bedside chair)?: A Little Help needed to walk in hospital room?: A Little Help needed climbing 3-5  steps with a railing? : A Little 6 Click Score: 18    End of Session Equipment Utilized During Treatment: Gait belt Activity Tolerance: Patient tolerated treatment well Patient left: in chair;with call bell/phone within reach;with chair alarm set Nurse Communication: Mobility status PT Visit Diagnosis: Unsteadiness on feet (R26.81);Other abnormalities of gait and mobility (R26.89);Muscle weakness (generalized) (M62.81)     Time: 1040-1100 PT Time Calculation (min) (ACUTE ONLY): 20 min  Charges:  $Gait Training: 8-22 mins                       Carney Living PT DPT  02/17/2019, 2:06 PM

## 2019-02-17 NOTE — Progress Notes (Signed)
Neurology Progress Note   S:// Patient seen and examined.  Continues to improve.   O:// Current vital signs: BP 130/82 (BP Location: Left Arm)   Pulse 84   Temp (!) 97.3 F (36.3 C) (Oral)   Resp 16   Ht 5\' 7"  (1.702 m)   Wt (!) 136.5 kg   SpO2 100%   BMI 47.13 kg/m  Vital signs in last 24 hours: Temp:  [97.3 F (36.3 C)-98.4 F (36.9 C)] 97.3 F (36.3 C) (06/19 0807) Pulse Rate:  [72-95] 84 (06/19 0807) Resp:  [16-19] 16 (06/19 0807) BP: (124-142)/(70-86) 130/82 (06/19 0807) SpO2:  [95 %-100 %] 100 % (06/19 0807) GENERAL: Awake, alert in NAD HEENT: - Normocephalic and atraumatic, dry mm, no LN++, no Thyromegally LUNGS - Clear to auscultation bilaterally with no wheezes CV - S1S2 RRR, no m/r/g, equal pulses bilaterally. ABDOMEN - Soft, nontender, nondistended with normoactive BS Neurological exam Awake alert oriented x3 very pleasant Speech non-dysarthric No evidence of aphasia Cranial nerves: Pupils equal round reactive light, extraocular movements intact, face is symmetric, visual fields are full, facial sensation intact, tongue and palate midline. Motor exam: 5/5 in both upper extremities.  Hip flexors bilaterally 4/5 with some limitation due to pain.  Knee flexors 4 to 4+/5 bilaterally.  Plantar and dorsiflexors are 4/5 bilaterally.  Sensory exam: Continues to have a level around T12 but reports subjective improvement- sensation on the left is better than right. Coordination: Intact finger-nose-finger DTRs absent on the left lower extremity, 1+ biceps bilaterally, 1+ right ankle jerk and right knee jerk  Medications  Current Facility-Administered Medications:  .  0.9 %  sodium chloride infusion, 250 mL, Intravenous, PRN, Doutova, Anastassia, MD .  acetaminophen (TYLENOL) tablet 650 mg, 650 mg, Oral, Q6H PRN **OR** acetaminophen (TYLENOL) suppository 650 mg, 650 mg, Rectal, Q6H PRN, Doutova, Anastassia, MD .  ALPRAZolam Duanne Moron) tablet 1 mg, 1 mg, Oral, PRN, Roel Cluck,  Anastassia, MD, 1 mg at 02/15/19 2122 .  atorvastatin (LIPITOR) tablet 20 mg, 20 mg, Oral, Daily, Doutova, Anastassia, MD, 20 mg at 02/17/19 0900 .  HYDROcodone-acetaminophen (NORCO/VICODIN) 5-325 MG per tablet 1-2 tablet, 1-2 tablet, Oral, Q4H PRN, Toy Baker, MD, 1 tablet at 02/14/19 0950 .  insulin aspart (novoLOG) injection 0-15 Units, 0-15 Units, Subcutaneous, TID WC, Swayze, Ava, DO, 8 Units at 02/17/19 0749 .  insulin aspart (novoLOG) injection 5 Units, 5 Units, Subcutaneous, TID WC, Swayze, Ava, DO, 5 Units at 02/17/19 0754 .  insulin glargine (LANTUS) injection 15 Units, 15 Units, Subcutaneous, BID, Swayze, Ava, DO, 15 Units at 02/16/19 2221 .  LORazepam (ATIVAN) injection 0.5 mg, 0.5 mg, Intravenous, Once, Doutova, Anastassia, MD .  losartan (COZAAR) tablet 100 mg, 100 mg, Oral, q morning - 10a, Doutova, Anastassia, MD, 100 mg at 02/17/19 0859 .  methylPREDNISolone sodium succinate (SOLU-MEDROL) 1,000 mg in sodium chloride 0.9 % 50 mL IVPB, 1,000 mg, Intravenous, Daily, Amie Portland, MD, Last Rate: 58 mL/hr at 02/16/19 0908, 1,000 mg at 02/16/19 0908 .  ondansetron (ZOFRAN) tablet 4 mg, 4 mg, Oral, Q6H PRN **OR** ondansetron (ZOFRAN) injection 4 mg, 4 mg, Intravenous, Q6H PRN, Doutova, Anastassia, MD .  polyethylene glycol (MIRALAX / GLYCOLAX) packet 17 g, 17 g, Oral, Daily, Swayze, Ava, DO, 17 g at 02/17/19 0859 .  sodium chloride flush (NS) 0.9 % injection 3 mL, 3 mL, Intravenous, Q12H, Doutova, Anastassia, MD, 3 mL at 02/16/19 2222 .  sodium chloride flush (NS) 0.9 % injection 3 mL, 3 mL, Intravenous, PRN, Doutova, Anastassia,  MD, 3 mL at 02/17/19 0910 Labs CBC    Component Value Date/Time   WBC 7.9 02/15/2019 0454   RBC 4.73 02/15/2019 0454   HGB 12.7 02/15/2019 0454   HGB 13.3 12/31/2016 1032   HCT 39.6 02/15/2019 0454   HCT 40.2 12/31/2016 1032   PLT 180 02/15/2019 0454   PLT 223 12/31/2016 1032   MCV 83.7 02/15/2019 0454   MCV 80 12/31/2016 1032   MCH 26.8  02/15/2019 0454   MCHC 32.1 02/15/2019 0454   RDW 14.7 02/15/2019 0454   RDW 16.3 (H) 12/31/2016 1032   LYMPHSABS 1.3 02/15/2019 0454   LYMPHSABS 1.2 12/31/2016 1032   MONOABS 0.6 02/15/2019 0454   EOSABS 0.1 02/15/2019 0454   EOSABS 0.1 12/31/2016 1032   BASOSABS 0.0 02/15/2019 0454   BASOSABS 0.0 12/31/2016 1032    CMP     Component Value Date/Time   NA 139 02/15/2019 0454   NA 141 05/04/2017 0818   K 3.7 02/15/2019 0454   CL 105 02/15/2019 0454   CO2 24 02/15/2019 0454   GLUCOSE 114 (H) 02/15/2019 0454   BUN 11 02/15/2019 0454   BUN 11 05/04/2017 0818   CREATININE 0.54 02/15/2019 0454   CALCIUM 9.5 02/15/2019 0454   PROT 6.2 (L) 02/14/2019 0809   PROT 6.9 05/04/2017 0818   ALBUMIN 3.1 (L) 02/14/2019 0809   ALBUMIN 4.1 05/04/2017 0818   AST 42 (H) 02/14/2019 0809   ALT 26 02/14/2019 0809   ALKPHOS 57 02/14/2019 0809   BILITOT 1.2 02/14/2019 0809   BILITOT 0.2 05/04/2017 0818   GFRNONAA >60 02/15/2019 0454   GFRAA >60 02/15/2019 0454  IgG index- normal Oligoclonal bands pending  Assessment: 63 year old woman past history of obesity arthritis bilateral hip replacement, diabetes and hypertension presenting with sudden onset of bilateral lower extremity weakness that made her fall.  This progressed to near complete paralysis and then started to resolve over the next few days. Exam and imaging findings localized to the conus medullaris lesion Differential of this lesion spinal cord infarct versus transverse myelitis. IgG index is normal, no brain and C-spine lesions- less likely to be demyelinating process Due to risk factors-most likely to be spinal cord infarct  Recommendations: Appreciate therapy inputs.  Recommend home health with PT along with hospital bed. Continue 5 days of IV steroids. Pending o-bands on CSF We will need outpatient neurology follow-up-with Dr. Arlice Colt at [facility] neurology  -- Amie Portland, MD [facility] Pager:  [phone] If 7pm to 7am, please call on call as listed on AMION.

## 2019-02-18 ENCOUNTER — Encounter (HOSPITAL_COMMUNITY): Payer: Self-pay | Admitting: Radiology

## 2019-02-18 ENCOUNTER — Inpatient Hospital Stay (HOSPITAL_COMMUNITY): Payer: 59

## 2019-02-18 DIAGNOSIS — G9511 Acute infarction of spinal cord (embolic) (nonembolic): Principal | ICD-10-CM

## 2019-02-18 LAB — GLUCOSE, CAPILLARY
Glucose-Capillary: 243 mg/dL — ABNORMAL HIGH (ref 70–99)
Glucose-Capillary: 317 mg/dL — ABNORMAL HIGH (ref 70–99)
Glucose-Capillary: 333 mg/dL — ABNORMAL HIGH (ref 70–99)
Glucose-Capillary: 355 mg/dL — ABNORMAL HIGH (ref 70–99)

## 2019-02-18 LAB — LIPID PANEL
Cholesterol: 163 mg/dL (ref 0–200)
HDL: 60 mg/dL (ref >40)
LDL Cholesterol: 81 mg/dL (ref 0–99)
Total CHOL/HDL Ratio: 2.7 RATIO
Triglycerides: 110 mg/dL (ref <150)
VLDL: 22 mg/dL (ref 0–40)

## 2019-02-18 MED ORDER — ASPIRIN 81 MG PO CHEW
81.0000 mg | CHEWABLE_TABLET | Freq: Every day | ORAL | Status: DC
  Administered 2019-02-18 – 2019-02-20 (×3): 81 mg via ORAL
  Filled 2019-02-18 (×3): qty 1

## 2019-02-18 MED ORDER — INSULIN GLARGINE 100 UNIT/ML ~~LOC~~ SOLN
40.0000 [IU] | Freq: Two times a day (BID) | SUBCUTANEOUS | Status: DC
  Administered 2019-02-18: 40 [IU] via SUBCUTANEOUS
  Filled 2019-02-18 (×3): qty 0.4

## 2019-02-18 MED ORDER — IOHEXOL 350 MG/ML SOLN
75.0000 mL | Freq: Once | INTRAVENOUS | Status: AC | PRN
  Administered 2019-02-18: 75 mL via INTRAVENOUS

## 2019-02-18 NOTE — Progress Notes (Signed)
Physical Therapy Treatment Patient Details Name: Laura Blackwell MRN: [id number] DOB: 10/10/1955 Today's Date: 02/18/2019    History of Present Illness Naylin T Roa is a 63 y.o. female with medical history significant of, diabetes mellitus, HTN, PCOS, vitamin D deficiency, sp right hip replacement. Pt presenting for a rather sudden onset of bilateral lower extremity weakness that made her fall and gradually progressed to paralysis of her lower extremities which then started to improve over the last 1 or 2 days.  Neuro:  steroids. Primary differentials at this time are spinal cord infarct at the conus level versus transverse myelitis. Spinal tap performed: CSF examination none consistent with infection.    PT Comments    Pt making excellent progress towards her physical therapy goals, very motivated to participate. Pt wanting to work on stair training again to prepare for discharge home. Received with bowel incontinence, requiring totalA for peri care. Pt negotiating 5 steps with bilateral railings and min guard assist to simulate home set up. Continues to require hands on assist for ambulation with walker. D/c plan remains appropriate.     Follow Up Recommendations  Home health PT;Supervision for mobility/OOB     Equipment Recommendations  Hospital bed    Recommendations for Other Services       Precautions / Restrictions Precautions Precautions: Fall Restrictions Weight Bearing Restrictions: No    Mobility  Bed Mobility Overal bed mobility: Needs Assistance Bed Mobility: Supine to Sit   Sidelying to sit: Modified independent (Device/Increase time)       General bed mobility comments: No physical assist required  Transfers Overall transfer level: Needs assistance Equipment used: Rolling walker (2 wheeled) Transfers: Sit to/from Stand Sit to Stand: Min guard         General transfer comment: Use of momentum to stand  Ambulation/Gait Ambulation/Gait  assistance: Min guard Gait Distance (Feet): 15 Feet Assistive device: Rolling walker (2 wheeled) Gait Pattern/deviations: Step-through pattern;Wide base of support;Decreased dorsiflexion - right;Decreased dorsiflexion - left;Decreased stride length     General Gait Details: slow speed, trunk slightly flexed. no overt LOB   Stairs Stairs: Yes Stairs assistance: Min guard Stair Management: Two rails Number of Stairs: 5 General stair comments: Close min guard, cues for sequencing ascending with LLE, descending with RLE. Pt pulling up heavily on rails   Wheelchair Mobility    Modified Rankin (Stroke Patients Only)       Balance Overall balance assessment: Needs assistance   Sitting balance-Leahy Scale: Good     Standing balance support: Bilateral upper extremity supported Standing balance-Leahy Scale: Poor Standing balance comment: reliant on bilateral UE support                            Cognition Arousal/Alertness: Awake/alert Behavior During Therapy: WFL for tasks assessed/performed Overall Cognitive Status: Within Functional Limits for tasks assessed                                        Exercises      General Comments        Pertinent Vitals/Pain Pain Assessment: No/denies pain    Home Living                      Prior Function            PT Goals (current goals can now  be found in the care plan section) Acute Rehab PT Goals Patient Stated Goal: to go home Potential to Achieve Goals: Good Progress towards PT goals: Progressing toward goals    Frequency    Min 3X/week      PT Plan Current plan remains appropriate    Co-evaluation              AM-PAC PT "6 Clicks" Mobility   Outcome Measure  Help needed turning from your back to your side while in a flat bed without using bedrails?: None Help needed moving from lying on your back to sitting on the side of a flat bed without using bedrails?:  None Help needed moving to and from a bed to a chair (including a wheelchair)?: A Little Help needed standing up from a chair using your arms (e.g., wheelchair or bedside chair)?: A Little Help needed to walk in hospital room?: A Little Help needed climbing 3-5 steps with a railing? : A Little 6 Click Score: 20    End of Session Equipment Utilized During Treatment: Gait belt Activity Tolerance: Patient tolerated treatment well Patient left: in chair;with call bell/phone within reach;with nursing/sitter in room Nurse Communication: Mobility status PT Visit Diagnosis: Unsteadiness on feet (R26.81);Other abnormalities of gait and mobility (R26.89);Muscle weakness (generalized) (M62.81)     Time: 3419-3790 PT Time Calculation (min) (ACUTE ONLY): 40 min  Charges:  $Gait Training: 8-22 mins $Therapeutic Activity: 23-37 mins                     Ellamae Sia, [place], DPT Acute Rehabilitation Services Pager [phone] Office [phone]    Willy Eddy 02/18/2019, 5:33 PM

## 2019-02-18 NOTE — Plan of Care (Signed)
Progressing towards goals

## 2019-02-18 NOTE — Progress Notes (Signed)
PROGRESS NOTE  Laura Blackwell MEQ:[id number] DOB: [birth year] DOA: 02/13/2019 PCP: Carol Ada, MD  Brief History   Laura Blackwell is a 63 y.o. female with medical history significant of, diabetes mellitus, HTN, PCOS, vitamin D deficiency, sp right hip replacement   The patient is a 63 yr old woman who presented with lower back pain and weakness. She states that she had a fall on Friday 4 days ago at work due to significant weakness in her lower extremities but time she woke up on Saturday she had numbness on both buttocks and down to the back of her legs worse on the left.  She has been having significant low back pain.Denies hitting her head. On Saturday she stood up and took 2 steps and fell flat. She must have been unconscious for a while bc when she woke up EMS was there.  In the ED the patient was found to have no sensation in her low back, perineum. She is unable to stand or move her toes. She had lost control of bowel and bladder. He has a history of what sounded like radicular pain that has been evaluated by orthopedic surgery who told her that it was not due to her hips. She has had injections in her back early in the year. She states that her symptoms improved with physical therapy. She was transferred from [place] to [facility] as she was unable to tolerate an MRI at [facility]. The hope was that she could have an open MRI here.  She was admitted to a medical bed here. Neurology has been consulted. The patient has been started on a 5 day course of high dose IV steroids for either transverse myelitis, MS, or spinal cord CVA. Due to the patient's risk factors neurology favors CVA.  Consultants  . Neurology  Procedures  . Lumbar puncture  Antibiotics   Anti-infectives (From admission, onward)   None     .   Subjective  The patient states that she continues to improve. No new complaints.  Objective   Vitals:  Vitals:   02/18/19 0816 02/18/19 1120  BP: 133/74  (!) 151/85  Pulse: 67 (!) 115  Resp: 16 16  Temp: (!) 97.5 F (36.4 C)   SpO2: 100% 98%    Exam:  Constitutional:  . The patient is awake, alert, and oriented x 3. No acute distress. Respiratory:  . No increased work of breathing. . No wheezes, rales, or rhonchi.  . No tactile fremitus. Cardiovascular:  . Regular rate and rhythm. . No LE extremity edema   . Normal pedal pulses Abdomen:  . Abdomen appears normal; no tenderness or masses . No hernias . No HSM Musculoskeletal:  . No cyanosis, clubbing or edema. Skin:  . No rashes, lesions, ulcers . palpation of skin: no induration or nodules Neurologic:  . CN 2-12 intact . Improving sensation and motor strength in lower extremities, back, and perineum. Psychiatric:  . Mental status o Mood, affect appropriate o Orientation to person, place, time  . judgment and insight appear intact   I have personally reviewed the following:   Today's Data  . BMP, CBC, Vitals  Scheduled Meds: . aspirin  81 mg Oral Daily  . atorvastatin  20 mg Oral Daily  . insulin aspart  0-15 Units Subcutaneous TID WC  . insulin aspart  5 Units Subcutaneous TID WC  . insulin glargine  25 Units Subcutaneous BID  . LORazepam  0.5 mg Intravenous Once  .  losartan  100 mg Oral q morning - 10a  . polyethylene glycol  17 g Oral Daily  . sodium chloride flush  3 mL Intravenous Q12H   Continuous Infusions: . sodium chloride    . methylPREDNISolone (SOLU-MEDROL) injection 1,000 mg (02/18/19 1135)    Active Problems:   Type 2 diabetes mellitus without complication, without long-term current use of insulin (HCC)   Morbid obesity (HCC)   Cauda equina syndrome (HCC)   DM2 (diabetes mellitus, type 2) (HCC)   Syncope   Essential hypertension   LOS: 5 days    A & P   Cauda equina vs transverse myelitis, vs spinal chord CVA [facility])  Neurology has been consulted. And plan is for open MRI today. Neurosurgery contacted me to let me know that they are  available, if needed, but that they thought that transverse myelitis was most likely and that neurology would be the appropriate service for this diagnosis. I appreciate neurology's assistance. The patient has been started on high dose steroids. This will be a 5 day course. She states that she is continuing to have improved sensation and motor strength each day. Plans are being made for discharge to home. I have ordered a hospital bed for her as well as home health PT/OT. I have discussed the patient in detail with Dr. Rory Percy. The plan will be to discharge the patient to home tomorrow after she has completed her fifth day of high dose steroids. She will be discharged on an aspirin as her weakness and loss of strength in her limbs is most likely due top Spinal Cord CVA.   Morbid obesity ([place]) Noted. Chronic and stable.  We will need nutritional consult as an outpatient.  Syncope: Occurred more than 24 hours ago with onset of paresthesia and weakness. Will observe on telemetry to see if there is any arrhythmias.    Essential hypertension: Resume home medications when able to tolerate.  Constipation: Stool softeners ordered. Monitor.  DM 2 : Glucoses will be followed with FSBS and SSI. HBA1c was Order Sensitive SSI. TSH is normal and HbA1c is pending. Oral medications have been held. Her glucoses will be considerably higher due to high dose steroids. SSI intensity has been increased.  Leg edema on the left:  Pt states this has been the case since surgery in 1980, but has gotten worse lately. Doppler was negative for DVT.  I have seen and examined this patient myself. I have spent 30 minutes in her evaluation and care.  DVT prophylaxis:  SCD    Code Status:  FULL CODE  as per patient   Family Communication:   No family at bedside Disposition Plan:     likely will need placement for rehabilitation  Amela Handley, DO [facility] Direct contact: see [email or web address]  7PM-7AM contact night  coverage as above 02/18/2019, 1:34 PM  LOS: 1 day

## 2019-02-18 NOTE — Progress Notes (Signed)
Brief progress note  Patient continues to improve. Right lower extremity at the hip is 4+/5. She will complete 5 days of steroids tomorrow.  Reviewing her MR imaging of the brain, C-spine, T-spine and L-spine, CSF results, history and improvement in her exam, this is more likely a spinal cord infarct that a demyelinating episode. She has been getting steroids for presumed demyelination, and I think they should be completed.  Demyelinating disease w/u CSF analysis with mildly elevated protein and 7 cells.  Normal IgG index.   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 Hemoglobin A1c 7.5 Lipid panel has not been checked- check lipid panel, goal LDL less than 70. Head and neck vessel imaging-CTA head+neck - unremarkable.  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  Recommendations: Will receive 5 days of high-dose IV steroids Has received PT with recommendations for home PT and hospital bed which will be arranged. Antiplatelets - ASA81mg  PO daily 30 day heart monitor - sent message to [name] for coordinating. After completion of 5 days of high-dose IV steroids, she can be discharged home with outpatient follow-up at [facility] neurology with Dr. Arlice Colt.   Inpatient neurology will be available as needed.  -- Amie Portland, MD [facility] Pager: [phone] If 7pm to 7am, please call on call as listed on AMION.

## 2019-02-18 NOTE — TOC Progression Note (Addendum)
Transition of Care [facility]) - Progression Note    Patient Details  Name: Laura Blackwell MRN: [id number] Date of Birth: 1956-06-21  Transition of Care [facility]) CM/SW Contact  Carles Collet, RN Phone Number: 02/18/2019, 3:43 PM  Clinical Narrative:   Spoke w patient, she states that she spoke w DME company this morning and hospital bed will be delivered MOnday as that was her expected DC date. She does not have preference for [facility] provider. [facility]-no [facility]- no (per patient) [facility]- no [facility]- no [facility]- no    Expected Discharge Plan: [place] Barriers to Discharge: Continued Medical Work up  Expected Discharge Plan and Services Expected Discharge Plan: [place]   Discharge Planning Services: CM Consult Post Acute Care Choice: Durable Medical Equipment Living arrangements for the past 2 months: Single Family Home(2 levels with bedroom on upper level)                 DME Arranged: Hospital bed DME Agency: [employer]       HH Arranged: PT, OT           Social Determinants of Health (SDOH) Interventions    Readmission Risk Interventions No flowsheet data found.

## 2019-02-19 LAB — GLUCOSE, CAPILLARY
Glucose-Capillary: 279 mg/dL — ABNORMAL HIGH (ref 70–99)
Glucose-Capillary: 321 mg/dL — ABNORMAL HIGH (ref 70–99)
Glucose-Capillary: 399 mg/dL — ABNORMAL HIGH (ref 70–99)
Glucose-Capillary: 428 mg/dL — ABNORMAL HIGH (ref 70–99)

## 2019-02-19 LAB — ANAEROBIC CULTURE

## 2019-02-19 MED ORDER — INSULIN GLARGINE 100 UNIT/ML ~~LOC~~ SOLN
55.0000 [IU] | Freq: Two times a day (BID) | SUBCUTANEOUS | Status: DC
  Administered 2019-02-19 – 2019-02-20 (×3): 55 [IU] via SUBCUTANEOUS
  Filled 2019-02-19 (×4): qty 0.55

## 2019-02-19 NOTE — Progress Notes (Signed)
PROGRESS NOTE  Laura Blackwell TGY:[id number] DOB: 08-30-1956 DOA: 02/13/2019 PCP: Laura Ada, MD  Brief History   Laura Blackwell is a 63 y.o. female with medical history significant of, diabetes mellitus, HTN, PCOS, vitamin D deficiency, sp right hip replacement   The patient is a 63 yr old woman who presented with lower back pain and weakness. She states that she had a fall on Friday 4 days ago at work due to significant weakness in her lower extremities but time she woke up on Saturday she had numbness on both buttocks and down to the back of her legs worse on the left.  She has been having significant low back pain.Denies hitting her head. On Saturday she stood up and took 2 steps and fell flat. She must have been unconscious for a while bc when she woke up EMS was there.  In the ED the patient was found to have no sensation in her low back, perineum. She is unable to stand or move her toes. She had lost control of bowel and bladder. He has a history of what sounded like radicular pain that has been evaluated by orthopedic surgery who told her that it was not due to her hips. She has had injections in her back early in the year. She states that her symptoms improved with physical therapy. She was transferred from [place] to [facility] as she was unable to tolerate an MRI at [facility]. The hope was that she could have an open MRI here.  She was admitted to a medical bed here. Neurology has been consulted. The patient has been started on a 5 day course of high dose IV steroids for either transverse myelitis, MS, or spinal cord CVA. This will be completed tomorrow. Due to the patient's risk factors neurology favors stroke involving the conus medullaris.   Neurology has recommended placement of a 30 day cardiac monitor. He states in his note that he has communicated this to [employer] by message.   Consultants  . Neurology  Procedures  . Lumbar puncture  Antibiotics    Anti-infectives (From admission, onward)   None      Subjective  The patient states that she continues to improve. No new complaints.   Objective   Vitals:  Vitals:   02/19/19 0858 02/19/19 1243  BP: (!) 142/82 134/88  Pulse: 92 85  Resp: 18 18  Temp: 97.8 F (36.6 C) 97.8 F (36.6 C)  SpO2: 98% 95%    Exam:  Constitutional:  . The patient is awake, alert, and oriented x 3. No acute distress. Respiratory:  . No increased work of breathing. . No wheezes, rales, or rhonchi.  . No tactile fremitus. Cardiovascular:  . Regular rate and rhythm. . No LE extremity edema   . Normal pedal pulses Abdomen:  . Abdomen appears normal; no tenderness or masses . No hernias . No HSM Musculoskeletal:  . No cyanosis, clubbing or edema. Skin:  . No rashes, lesions, ulcers . palpation of skin: no induration or nodules Neurologic:  . CN 2-12 intact . Improving sensation and motor strength in lower extremities, back, and perineum. Psychiatric:  . Mental status o Mood, affect appropriate o Orientation to person, place, time  . judgment and insight appear intact   I have personally reviewed the following:   Today's Data  . BMP, CBC, Vitals  Scheduled Meds: . aspirin  81 mg Oral Daily  . atorvastatin  20 mg Oral Daily  .  insulin aspart  0-15 Units Subcutaneous TID WC  . insulin aspart  5 Units Subcutaneous TID WC  . insulin glargine  55 Units Subcutaneous BID  . LORazepam  0.5 mg Intravenous Once  . losartan  100 mg Oral q morning - 10a  . polyethylene glycol  17 g Oral Daily  . sodium chloride flush  3 mL Intravenous Q12H   Continuous Infusions: . sodium chloride      Active Problems:   Type 2 diabetes mellitus without complication, without long-term current use of insulin (HCC)   Morbid obesity (HCC)   Cauda equina syndrome (HCC)   DM2 (diabetes mellitus, type 2) (HCC)   Syncope   Essential hypertension   LOS: 6 days    A & P   Cauda equina vs transverse  myelitis, vs spinal chord CVA [facility])  Neurology has been consulted. And plan is for open MRI today. Neurosurgery contacted me to let me know that they are available, if needed, but that they thought that transverse myelitis was most likely and that neurology would be the appropriate service for this diagnosis. I appreciate neurology's assistance. The patient has been started on high dose steroids. This will be a 5 day course. She states that she is continuing to have improved sensation and motor strength each day. Plans are being made for discharge to home. I have ordered a hospital bed for her as well as home health PT/OT. I have discussed the patient in detail with Dr. Rory Percy. The plan will be to discharge the patient to homeafter she has completed her fifth day of high dose steroids tomorrow. She will be discharged on an aspirin as her weakness and loss of strength in her limbs is most likely due top Spinal Cord CVA. Neurology wants for the patient to have a 30 day cardiac monitor on discharge. He has communicated this to [place] in a message.  Morbid obesity ([place]) Noted. Chronic and stable.  We will need nutritional consult as an outpatient.  Syncope: Occurred more than 24 hours ago with onset of paresthesia and weakness. Will observe on telemetry to see if there is any arrhythmias.    Essential hypertension: Resume home medications when able to tolerate.  Constipation: Stool softeners ordered. Monitor.  DM 2 : Glucoses will be followed with FSBS and SSI. HBA1c was Order Sensitive SSI. TSH is normal and HbA1c is pending. Oral medications have been held. Her glucoses will be considerably higher due to high dose steroids. SSI intensity has been increased.  Leg edema on the left:  Pt states this has been the case since surgery in 1980, but has gotten worse lately. Doppler was negative for DVT.  I have seen and examined this patient myself. I have spent 32 minutes in her evaluation and care.   DVT prophylaxis:  SCD    Code Status:  FULL CODE  as per patient   Family Communication:   No family at bedside Disposition Plan:     likely will need placement for rehabilitation  Bandon Sherwin, DO [facility] Direct contact: see [email or web address]  7PM-7AM contact night coverage as above 02/19/2019, 2:30 PM  LOS: 1 day

## 2019-02-19 NOTE — Plan of Care (Signed)
Progressing towards goals

## 2019-02-19 NOTE — Progress Notes (Signed)
Oligoclonal bands in CSF not seen.  All tests till date do not favor demyelination.  Favor Spinal cord infarct.  Recs as in note from yesterday.  Follow up with Dr. Felecia Shelling at [facility]rology in 4-6 weeks from discharge   -- Amie Portland, MD [facility] Pager: [phone] If 7pm to 7am, please call on call as listed on AMION.

## 2019-02-19 NOTE — Progress Notes (Signed)
Occupational Therapy Treatment Patient Details Name: Laura Blackwell MRN: [id number] DOB: Mar 22, 1956 Today's Date: 02/19/2019    History of present illness Laura Blackwell is a 63 y.o. female with medical history significant of, diabetes mellitus, HTN, PCOS, vitamin D deficiency, sp right hip replacement. Pt presenting for a rather sudden onset of bilateral lower extremity weakness that made her fall and gradually progressed to paralysis of her lower extremities which then started to improve over the last 1 or 2 days.  Neuro:  steroids. Primary differentials at this time are spinal cord infarct at the conus level versus transverse myelitis. Spinal tap performed: CSF examination none consistent with infection.   OT comments  When asked what goal she wanted to achieve today, pt stated "do the stairs." Pt completing stair training x2 times up and down 2 steps in rehab gym with minguardA only. Pt taking slow steps 1 step at a time with fair balance. Pt stopped at sink in recliner to wash hands. Pt performing bed mobility with supervisionA and sit to stand with minguardA for stability for initial standing balance. Pt ambulating 15' to recliner where she transferred. Pt increasing independence with ADL and mobility. Pt reports "I have a lot more feeling in my legs today." OT following acutely.      Follow Up Recommendations  Home health OT;Supervision/Assistance - 24 hour    Equipment Recommendations  Hospital bed    Recommendations for Other Services      Precautions / Restrictions Precautions Precautions: Fall Restrictions Weight Bearing Restrictions: No       Mobility Bed Mobility Overal bed mobility: Modified Independent             General bed mobility comments: No physical assist required  Transfers Overall transfer level: Needs assistance Equipment used: Rolling walker (2 wheeled) Transfers: Sit to/from Stand Sit to Stand: Min guard         General transfer  comment: Use of momentum to stand    Balance Overall balance assessment: Needs assistance   Sitting balance-Leahy Scale: Good     Standing balance support: Bilateral upper extremity supported Standing balance-Leahy Scale: Poor Standing balance comment: reliant on bilateral UE support                           ADL either performed or assessed with clinical judgement   ADL Overall ADL's : Needs assistance/impaired                                     Functional mobility during ADLs: Min guard;Rolling walker;Cueing for safety General ADL Comments: set-upA for UB ADL and modA to maxA for LB ADL     Vision Baseline Vision/History: No visual deficits Vision Assessment?: No apparent visual deficits   Perception     Praxis      Cognition Arousal/Alertness: Awake/alert Behavior During Therapy: WFL for tasks assessed/performed Overall Cognitive Status: Within Functional Limits for tasks assessed                                          Exercises     Shoulder Instructions       General Comments When asked what goal she wanted to achieve today, pt stated "do the stairs." Pt completing stair training x2 times  up and down 2 steps in rehab gym with minguardA only. Pt taking slow steps 1 step at a time.    Pertinent Vitals/ Pain       Pain Assessment: No/denies pain  Home Living Family/patient expects to be discharged to:: Private residence                             Home Equipment: Gilford Rile - 2 wheels;Bedside commode;Grab bars - toilet;Grab bars - tub/shower          Prior Functioning/Environment Level of Independence: Independent        Comments: Patient was working 2 jobs and very active    Geologist, engineering 2X/week        Progress Toward Goals  OT Goals(current goals can now be found in the care plan section)  Progress towards OT goals: Progressing toward goals  Acute Rehab OT Goals Patient Stated Goal:  to go home OT Goal Formulation: With patient Time For Goal Achievement: 03/01/19 Potential to Achieve Goals: Good ADL Goals Pt Will Perform Grooming: with modified independence;standing Pt Will Perform Upper Body Dressing: with modified independence;standing Pt Will Perform Lower Body Dressing: with modified independence;sitting/lateral leans;sit to/from stand Pt Will Perform Toileting - Clothing Manipulation and hygiene: with set-up;sitting/lateral leans;sit to/from stand Additional ADL Goal #1: Pt will perform OOB ADL with set-upA  with fair balance in standing x5 mins  Plan Discharge plan remains appropriate    Co-evaluation                 AM-PAC OT "6 Clicks" Daily Activity     Outcome Measure   Help from another person eating meals?: None Help from another person taking care of personal grooming?: None Help from another person toileting, which includes using toliet, bedpan, or urinal?: A Little Help from another person bathing (including washing, rinsing, drying)?: A Lot Help from another person to put on and taking off regular upper body clothing?: A Little Help from another person to put on and taking off regular lower body clothing?: A Lot 6 Click Score: 18    End of Session Equipment Utilized During Treatment: Gait belt;Rolling walker  OT Visit Diagnosis: Unsteadiness on feet (R26.81);Muscle weakness (generalized) (M62.81);Other abnormalities of gait and mobility (R26.89)   Activity Tolerance Patient tolerated treatment well   Patient Left in chair;with call bell/phone within reach;with chair alarm set   Nurse Communication Mobility status        Time: 9379-0240 OT Time Calculation (min): 20 min  Charges: OT General Charges $OT Visit: 1 Visit OT Treatments $Neuromuscular Re-education: 8-22 mins   Darryl Nestle) Marsa Aris OTR/L Acute Rehabilitation Services Pager: [phone] Office: [phone]    Audie Pinto 02/19/2019, 3:43 PM

## 2019-02-20 ENCOUNTER — Other Ambulatory Visit: Payer: Self-pay | Admitting: Cardiology

## 2019-02-20 DIAGNOSIS — I639 Cerebral infarction, unspecified: Secondary | ICD-10-CM

## 2019-02-20 DIAGNOSIS — G9511 Acute infarction of spinal cord (embolic) (nonembolic): Secondary | ICD-10-CM

## 2019-02-20 LAB — GLUCOSE, CAPILLARY
Glucose-Capillary: 218 mg/dL — ABNORMAL HIGH (ref 70–99)
Glucose-Capillary: 183 mg/dL — ABNORMAL HIGH (ref 70–99)

## 2019-02-20 MED ORDER — GLIMEPIRIDE 4 MG PO TABS: 4.0000 mg | ORAL_TABLET | Freq: Two times a day (BID) | ORAL | 0 refills | Status: DC

## 2019-02-20 NOTE — Progress Notes (Signed)
Occupational Therapy Treatment Patient Details Name: Laura Blackwell MRN: [id number] DOB: 11/06/1955 Today's Date: 02/20/2019    History of present illness Laura Blackwell is a 63 y.o. female with medical history significant of, diabetes mellitus, HTN, PCOS, vitamin D deficiency, sp right hip replacement. Pt presenting for a rather sudden onset of bilateral lower extremity weakness that made her fall and gradually progressed to paralysis of her lower extremities which then started to improve over the last 1 or 2 days.  Neuro:  steroids. Primary differentials at this time are spinal cord infarct at the conus level versus transverse myelitis. Spinal tap performed: CSF examination none consistent with infection.   OT comments  Treatment session with focus on d/c planning. Pt in recliner upon arrival reporting "I've got this" and expressing pleased with progress towards goals and ability to complete increased distance with ambulation and completing stairs last 2-3 days.  Discussed supervision at home from son and daughter who can assist as needed.  Pt discussed bowel and bladder are still "waking up" but that she is pleased with strength and sensation returning in BLE.  Discussed DME with pt reporting have BSC next to bed and RW and is to receive hospital bed at home for ease of mobility and transfers.  Pt repeatedly stated "I've got this" as she reports renewed confidence and strength.  Pt will continue to benefit from [facility] post d/c to continue to address dynamic balance and strength to complete ADLs.  Follow Up Recommendations  Home health OT;Supervision/Assistance - 24 hour    Equipment Recommendations  Hospital bed       Precautions / Restrictions Precautions Precautions: Fall Restrictions Weight Bearing Restrictions: No       Mobility Bed Mobility Overal bed mobility: Modified Independent Bed Mobility: Supine to Sit   Sidelying to sit: Modified independent (Device/Increase  time)       General bed mobility comments: No physical assist required. HOB elevated.   Transfers Overall transfer level: Needs assistance Equipment used: Rolling walker (2 wheeled) Transfers: Sit to/from Stand Sit to Stand: Min guard         General transfer comment: VC's for hand placement on seated surface for safety. Pt was able to power-up to full stand with close guard for safety but no physical assist.     Balance Overall balance assessment: Needs assistance Sitting-balance support: Feet supported;No upper extremity supported Sitting balance-Leahy Scale: Good     Standing balance support: Bilateral upper extremity supported Standing balance-Leahy Scale: Poor Standing balance comment: reliant on bilateral UE support                           ADL either performed or assessed with clinical judgement   ADL Overall ADL's : Needs assistance/impaired Eating/Feeding: Modified independent;Sitting   Grooming: Wash/dry hands;Wash/dry face;Oral care;Set up;Standing;Supervision/safety       Lower Body Bathing: Minimal assistance;Sit to/from stand Lower Body Bathing Details (indicate cue type and reason): had been incontinent of bowel, required assistance for thoroughness due to decreased sensation         Toilet Transfer: Supervision/safety;BSC;RW   Toileting- Clothing Manipulation and Hygiene: Minimal assistance;Sit to/from stand Toileting - Clothing Manipulation Details (indicate cue type and reason): had been incontinent of bowel, required assistance for thoroughness due to decreased sensation     Functional mobility during ADLs: Supervision/safety;Rolling walker       Vision Baseline Vision/History: No visual deficits Vision Assessment?: No apparent visual deficits  Cognition Arousal/Alertness: Awake/alert Behavior During Therapy: WFL for tasks assessed/performed Overall Cognitive Status: Within Functional Limits for tasks assessed                                                      Pertinent Vitals/ Pain       Pain Assessment: No/denies pain         Frequency  Min 2X/week        Progress Toward Goals  OT Goals(current goals can now be found in the care plan section)  Progress towards OT goals: Progressing toward goals  Acute Rehab OT Goals Patient Stated Goal: to go home OT Goal Formulation: With patient Time For Goal Achievement: 03/01/19 Potential to Achieve Goals: Good  Plan Discharge plan remains appropriate       AM-PAC OT "6 Clicks" Daily Activity     Outcome Measure   Help from another person eating meals?: None Help from another person taking care of personal grooming?: None Help from another person toileting, which includes using toliet, bedpan, or urinal?: A Little Help from another person bathing (including washing, rinsing, drying)?: A Lot Help from another person to put on and taking off regular upper body clothing?: A Little Help from another person to put on and taking off regular lower body clothing?: A Lot 6 Click Score: 18    End of Session    OT Visit Diagnosis: Unsteadiness on feet (R26.81);Muscle weakness (generalized) (M62.81);Other abnormalities of gait and mobility (R26.89)   Activity Tolerance Patient tolerated treatment well   Patient Left in chair;with call bell/phone within reach;with chair alarm set   Nurse Communication (ready for d/c)        Time: 1420-1433 OT Time Calculation (min): 13 min  Charges: OT General Charges $OT Visit: 1 Visit OT Treatments $Self Care/Home Management : 8-22 mins    Simonne Come, [phone] 02/20/2019, 2:39 PM

## 2019-02-20 NOTE — TOC Transition Note (Deleted)
Transition of Care [facility]) - CM/SW Discharge Note   Patient Details  Name: Laura Blackwell MRN: [id number] Date of Birth: 30-Aug-1956  Transition of Care [facility]) CM/SW Contact:  Zenon Mayo, RN Phone Number: 02/20/2019, 4:23 PM   Clinical Narrative:    Patient is for dc today home with [facility] for iv abx, Carolynn Sayers is also following patient.  NCM was given permission by patient to speak with daughter and son,  NCM spoke with Loletha Carrow and Bruce.  NCM offered choice to Bruce for [facility] services, he chose [place], referral given to [facility].  He states soc will be on Thursday , so they offered their private duty RN services to fill in when the [facility] RN would not be there and it would be 165/hr, but Vickie states they do not want to pay for a private RN, they would just want the [facility] and help out themselves, so Vickie will need to get teaching done today at the hospital with [facility] before patient is discharged.  Vickie will be responsible for giving the iv abx Tues, Wed, and Alvis Lemmings will pick up on Thursday and coodinate the times from there with Loletha Carrow and [place].  NCM awaiting to hear back from [facility] regarding teaching.    Final next level of care: [place] Barriers to Discharge: No Barriers Identified   Patient Goals and CMS Choice Patient states their goals for this hospitalization and ongoing recovery are:: get better CMS Medicare.gov Compare Post Acute Care list provided to:: Patient Represenative (must comment) Choice offered to / list presented to : Adult Children  Discharge Placement                       Discharge Plan and Services   Discharge Planning Services: CM Consult Post Acute Care Choice: Durable Medical Equipment          DME Arranged: (NA) DME Agency: [employer] Date DME Agency Contacted: 02/17/19 Time DME Agency Contacted: [phone] Representative spoke with at DME Agency: zack HH Arranged: RN [place] Agency: [place] Date [place]:  02/20/19 Time [place]: 1622 Representative spoke with at [place]: [place] ([place]) Interventions     Readmission Risk Interventions No flowsheet data found.

## 2019-02-20 NOTE — Plan of Care (Signed)
  Problem: Activity: Goal: Risk for activity intolerance will decrease Outcome: Progressing   

## 2019-02-20 NOTE — Progress Notes (Deleted)
Follow up with Dr. Felecia Shelling at [facility] in 4-6 weeks from discharge

## 2019-02-20 NOTE — Progress Notes (Signed)
Inpatient Diabetes Program Recommendations  AACE/ADA: New Consensus Statement on Inpatient Glycemic Control (2015)  Target Ranges:  Prepandial:   less than 140 mg/dL      [place] postprandial:   less than 180 mg/dL (1-2 hours)      Critically ill patients:  140 - 180 mg/dL   Lab Results  Component Value Date   GLUCAP 218 (H) 02/20/2019   HGBA1C 7.5 (H) 02/14/2019    Review of Glycemic Control Results for Laura Blackwell, Laura Blackwell (MRN [id number]) as of 02/20/2019 10:02  Ref. Range 02/19/2019 06:40 02/19/2019 11:33 02/19/2019 16:50 02/19/2019 21:41 02/20/2019 06:34  Glucose-Capillary Latest Ref Range: 70 - 99 mg/dL 279 (H) 321 (H) 399 (H) 428 (H) 218 (H)   Diabetes history: DM2 Outpatient Diabetes medications: Soliqua 32 units daily + Amaryl 2 mg bid + Metformin 1 gm bid Current orders for Inpatient glycemic control: Lantus 55 units bid+ Novolog 5 units tid meal coverage + Novolog correction 0-15 units tid  Inpatient Diabetes Program Recommendations:   Noted steroids now discontinued. -Increase Novolog meal coverage to 7 units tid if eats 50% meals -Increase Novolog correction to resistant 0-20 tid + Hs 0-5 units  Thank you, Bethena Roys E. Zacharius Funari, RN, MSN, CDE  Diabetes Coordinator Inpatient Glycemic Control Team Team Pager [phone] (8am-5pm) 02/20/2019 10:06 AM

## 2019-02-20 NOTE — TOC Transition Note (Signed)
Transition of Care [facility]) - CM/SW Discharge Note   Patient Details  Name: Laura Blackwell MRN: [id number] Date of Birth: 1955-10-23  Transition of Care [facility]) CM/SW Contact:  Zenon Mayo, RN Phone Number: 02/20/2019, 3:19 PM   Clinical Narrative:    Patient for dc today, NCM made referral to [place] with [facility] , she states she can take referral.  Soc will begin 24-48 hrs post dc.  Patient is to have hospital bed delivered today.   Final next level of care: [place] Barriers to Discharge: No Barriers Identified   Patient Goals and CMS Choice Patient states their goals for this hospitalization and ongoing recovery are:: get better CMS Medicare.gov Compare Post Acute Care list provided to:: Patient Choice offered to / list presented to : Parent  Discharge Placement                       Discharge Plan and Services   Discharge Planning Services: CM Consult Post Acute Care Choice: Durable Medical Equipment          DME Arranged: Hospital bed DME Agency: [employer] Date DME Agency Contacted: 02/17/19 Time DME Agency Contacted: [phone] Representative spoke with at DME Agency: zack HH Arranged: PT, OT [place] Agency: [facility] [employer] Date [place]: 02/20/19 Time [place]: 1519 Representative spoke with at [place]: [place]) Interventions     Readmission Risk Interventions No flowsheet data found.

## 2019-02-20 NOTE — Discharge Instructions (Signed)
Spinal Cord Infarction  A spinal cord infarction is a loss of blood supply to your spinal cord. It is sometimes called a spinal cord stroke. A spinal cord infarction can cause damage that prevents signals from traveling back and forth between your spinal cord and the rest of your body. The main blood vessel that supplies your spinal cord is called the anterior spinal artery. When this artery is blocked, the lower body is usually affected. What are the causes? This condition may be caused by:  Narrowing and thickening of arteries that supply your spinal cord (arteriosclerosis). The arteries may become narrow enough to block your blood supply.  A clot that forms in an artery farther away and then breaks loose and blocks the arteries that supply your spinal cord.  Aortic dissection. Aortic dissection happens when there is a tear in the wall of the body's main blood vessel (aorta). What increases the risk? You are more likely to develop this condition if you:  Have high blood pressure.  Are a smoker.  Have high cholesterol.  Have heart disease.  Have a family history of stroke or heart disease. What are the signs or symptoms? Symptoms generally appear in your body at and below the area of infarction within minutes or a few hours of the infarction. They can vary from person to person. Symptoms may include:  Sudden and severe back pain.  Muscle spasms.  Aching pain down through your legs.  Weakness in your legs.  Loss of movement in your legs (paralysis).  Inability to feel pain and temperature in your lower body.  Inability to control urination or bowel movements (incontinence). How is this diagnosed? Your health care provider may suspect spinal cord infarction based on the symptoms that suddenly appear in your lower body. A health care provider who specializes in the brain and spinal cord (neurologist) may examine you for:  Muscle weakness.  Areas of numbness.  Abnormal  responses when your muscles are tested (absent reflexes). You may have tests to confirm the diagnosis. These may include:  An MRI of your spinal cord. This is the most important test for diagnosing spinal cord infarction.  A CT of your spinal cord if an MRI is not possible.  Blood tests.  Tests to measure how well your nerves send messages to and from your spinal cord (electromyography). How is this treated? Treatment depends on your symptoms. It may include:  Blood-thinning medicines (anticoagulants) to improve circulation and prevent blood clots.  Medicines to improve bladder control.  Muscle relaxants to relieve muscle spasms.  Physical and occupational therapy to help you recover from weakness or paralysis.  Treatment for high blood pressure and high cholesterol. Recovery depends on the severity of the condition and how quickly treatment was started. Spinal cord damage can be permanent. If recovery is possible, it can take months or years. Follow these instructions at home:  Medicines  Take over-the-counter and prescription medicines only as told by your health care provider.  Do not drive or use heavy machinery while taking prescription pain medicine.  If you are taking prescription pain medicine, take actions to prevent or treat constipation. Your health care provider may recommend that you: ? Drink enough fluid to keep your urine pale yellow. ? Eat foods that are high in fiber, such as fresh fruits and vegetables, whole grains, and beans. ? Limit foods that are high in fat and processed sugars, such as fried or sweet foods. ? Take an over-the-counter or prescription medicine  for constipation. Activity  Ask your health care provider what activities are safe for you.  Do daily exercises as directed by your physical therapist. General instructions  Maintain a healthy weight.  Eat a heart-healthy diet. Include lots of fiber to prevent constipation.  Make sure you  have a good support system at home. Let someone know if you are struggling with anxiety or depression.  Do not use any products that contain nicotine or tobacco, such as cigarettes and e-cigarettes. If you need help quitting, ask your health care provider.  Work closely with all your health care providers. These may include therapists to help you: ? Exercise. ? Take care of yourself at home. ? Manage bowel and bladder problems. ? Cope with mental health conditions. ? Manage pain.  Keep all follow-up visits as told by your health care provider. This is important. Contact a health care provider if:  You have chills or a fever.  Your symptoms change or get worse.  You need more support at home. Get help right away if:  You have chest pain.  You have trouble breathing. Summary  A spinal cord infarction is a loss of blood supply to your spinal cord. It is sometimes called a spinal cord stroke.  A spinal cord infarction can cause damage that prevents signals from traveling back and forth between your spinal cord and the rest of your body.  Symptoms generally appear in your lower body within minutes or a few hours of the infarction.  Recovery depends on the severity of the condition and how quickly treatment was started.  Work closely with all your health care providers and keep follow-up appointments as told. This is important. This information is not intended to replace advice given to you by your health care provider. Make sure you discuss any questions you have with your health care provider. Document Released: 08/07/2002 Document Revised: 09/28/2017 Document Reviewed: 09/28/2017 Elsevier Interactive Patient Education  2019 [employer].

## 2019-02-20 NOTE — Progress Notes (Signed)
Physical Therapy Treatment Patient Details Name: Laura Blackwell MRN: [id number] DOB: April 17, 1956 Today's Date: 02/20/2019    History of Present Illness Laura Blackwell is a 63 y.o. female with medical history significant of, diabetes mellitus, HTN, PCOS, vitamin D deficiency, sp right hip replacement. Pt presenting for a rather sudden onset of bilateral lower extremity weakness that made her fall and gradually progressed to paralysis of her lower extremities which then started to improve over the last 1 or 2 days.  Neuro:  steroids. Primary differentials at this time are spinal cord infarct at the conus level versus transverse myelitis. Spinal tap performed: CSF examination none consistent with infection.    PT Comments    Pt progressing towards physical therapy goals. Was able to perform transfers and ambulation with min guard assist to supervision for safety with RW for support. Increased time required at beginning of session as pt did not realize she had a bowel movement in the bed. She was able to perform peri-care standing EOB with set-up assist and reports she cannot feel herself cleaning up with the washcloths. Tolerance for functional activity remains low however pt was able to ambulate a total of 100' today between ambulation in room and 2 bouts of ambulation in the hall. Will continue to follow and progress as able per POC.    Follow Up Recommendations  Home health PT;Supervision for mobility/OOB     Equipment Recommendations  Hospital bed    Recommendations for Other Services Rehab consult     Precautions / Restrictions Precautions Precautions: Fall Restrictions Weight Bearing Restrictions: No    Mobility  Bed Mobility Overal bed mobility: Modified Independent Bed Mobility: Supine to Sit   Sidelying to sit: Modified independent (Device/Increase time)       General bed mobility comments: No physical assist required. HOB elevated.   Transfers Overall transfer  level: Needs assistance Equipment used: Rolling walker (2 wheeled) Transfers: Sit to/from Stand Sit to Stand: Min guard         General transfer comment: VC's for hand placement on seated surface for safety. Pt was able to power-up to full stand with close guard for safety but no physical assist.   Ambulation/Gait Ambulation/Gait assistance: Min guard;Supervision Gait Distance (Feet): 100 Feet Assistive device: Rolling walker (2 wheeled) Gait Pattern/deviations: Step-through pattern;Wide base of support;Decreased dorsiflexion - right;Decreased dorsiflexion - left;Decreased stride length Gait velocity: decreased   General Gait Details: Pt able to progress gait distance this session, stating she believes her legs are getting stronger. Min guard assist provided progressing to supervision for safety. Chair follow utilized 2 decreased tolerance for functional activity.    Stairs Stairs: Yes Stairs assistance: Min guard Stair Management: Two rails Number of Stairs: 10(5x2) General stair comments: Close min guard, cues for sequencing ascending with LLE, descending with RLE. Pt pulling up heavily on rails   Wheelchair Mobility    Modified Rankin (Stroke Patients Only)       Balance Overall balance assessment: Needs assistance Sitting-balance support: Feet supported;No upper extremity supported Sitting balance-Leahy Scale: Good     Standing balance support: Bilateral upper extremity supported Standing balance-Leahy Scale: Poor Standing balance comment: reliant on bilateral UE support                            Cognition Arousal/Alertness: Awake/alert Behavior During Therapy: WFL for tasks assessed/performed Overall Cognitive Status: Within Functional Limits for tasks assessed  Exercises      General Comments        Pertinent Vitals/Pain Pain Assessment: No/denies pain    Home Living                       Prior Function            PT Goals (current goals can now be found in the care plan section) Acute Rehab PT Goals Patient Stated Goal: to go home PT Goal Formulation: With patient Potential to Achieve Goals: Good Progress towards PT goals: Progressing toward goals    Frequency    Min 3X/week      PT Plan Current plan remains appropriate    Co-evaluation              AM-PAC PT "6 Clicks" Mobility   Outcome Measure  Help needed turning from your back to your side while in a flat bed without using bedrails?: None Help needed moving from lying on your back to sitting on the side of a flat bed without using bedrails?: None Help needed moving to and from a bed to a chair (including a wheelchair)?: A Little Help needed standing up from a chair using your arms (e.g., wheelchair or bedside chair)?: A Little Help needed to walk in hospital room?: A Little Help needed climbing 3-5 steps with a railing? : A Little 6 Click Score: 20    End of Session Equipment Utilized During Treatment: Gait belt Activity Tolerance: Patient tolerated treatment well Patient left: in chair;with call bell/phone within reach;with nursing/sitter in room Nurse Communication: Mobility status PT Visit Diagnosis: Unsteadiness on feet (R26.81);Other abnormalities of gait and mobility (R26.89);Muscle weakness (generalized) (M62.81)     Time: 8144-8185 PT Time Calculation (min) (ACUTE ONLY): 40 min  Charges:  $Gait Training: 38-52 mins                     Rolinda Roan, PT, DPT Acute Rehabilitation Services Pager: [phone] Office: [phone]    Thelma Comp 02/20/2019, 2:11 PM

## 2019-02-20 NOTE — Discharge Summary (Signed)
Physician Discharge Summary  Laura Blackwell JFH:[id number] DOB: 06/11/1956 DOA: 02/13/2019  PCP: Carol Ada, MD  Admit date: 02/13/2019 Discharge date: 02/20/2019  Admitted From: Home Disposition: Home  Recommendations for Outpatient Follow-up:  1. Follow up with PCP in 1-2 weeks 2. Follow with neurology in 4 to 6 weeks 3. Please obtain BMP/CBC in one week 4. Please follow up on the following pending results:  Home Health: Yes Equipment/Devices: Hospital bed  Discharge Condition: Stable CODE STATUS: Full code Diet recommendation: Cardiac  Subjective: Patient seen and examined this morning.  She stated that she was feeling much better and had no complaint or any weakness and in which she was walking around without any weakness.  She wanted to go home and requested to discharge her now that her steroid doses have been completed.  Brief/Interim Summary: Laura Blackwell a 63 y.o.femalewith medical history significant of, diabetes mellitus, HTN, PCOS, vitamin D deficiency, sp right hip replacement  The patient is a 63 yr old woman who presented with lower back pain and weakness. She states that she had a fall on Friday 4 days prior to her presentation to the ED at work due to significant weakness in her lower extremities but time she woke up on Saturday she had numbness on both buttocks and down to the back of her legs worse on the left. She has been having significant low back pain.Denies hitting her head. On Saturday she stood up and took 2 steps and fell flat. She must have been unconscious for a while bc when she woke up EMS was there.  In the ED the patient was found to have no sensation in her low back, perineum. She is unable to stand or move her toes. She had lost control of bowel and bladder.  Had history of what sounded like radicular pain that has been evaluated by orthopedic surgery who told her that it was not due to her hips. She has had injections in her back  early in the year.Marland Kitchen She was transferred from [place] to [facility] as she was unable to tolerate an MRI at [facility].  MRI of the lumbar spine showed possible acute demyelination, possible multiple sclerosis or transverse myelitis.  She was admitted to a medical bed here. Neurology was consulted. The patient received 5-day course of high dose IV steroids for either transverse myelitis, MS, or spinal cord CVA.   She also had lumbar puncture done.  According to neurology notes, based on the fact that she did not have oligoclonal bands in CSF so demyelination was not favored and instead spinal cord infarction was favored as of potential diagnosis on her.  Neurology has recommended placement of a 30 day cardiac monitor and Dr Rory Percy has communicated this to Laura Blackwell by message.   Of note, due to receiving high-dose steroids, patient's blood sugar was significantly elevated which was managed by high-dose of Lantus along with sliding scale and while she was inpatient.  Finally now that she has been cleared by neurology and has received hospital bed at home so she is going to be discharged with home health care and home PT.  Discharge Diagnoses:  Active Problems:   Type 2 diabetes mellitus without complication, without long-term current use of insulin (HCC)   Morbid obesity (HCC)   DM2 (diabetes mellitus, type 2) ([place])   Syncope   Essential hypertension   Spinal cord infarction [facility])    Discharge Instructions  Discharge Instructions    Discharge  patient   Complete by: As directed    Discharge disposition: 06-Home-Health Care Svc   Discharge patient date: 02/20/2019     Allergies as of 02/20/2019   No Known Allergies     Medication List    TAKE these medications   amLODipine 5 MG tablet Commonly known as: NORVASC Take 5 mg by mouth daily.   atorvastatin 20 MG tablet Commonly known as: LIPITOR Take 20 mg by mouth daily.   Cholecalciferol 125 MCG (5000 UT) capsule Take  5,000 Units by mouth every Monday, Wednesday, and Friday.   furosemide 20 MG tablet Commonly known as: LASIX Take 20 mg by mouth 2 (two) times daily.   gabapentin 300 MG capsule Commonly known as: NEURONTIN Take 1 capsule (300 mg total) by mouth 3 (three) times daily.   glimepiride 4 MG tablet Commonly known as: Amaryl Take 1 tablet (4 mg total) by mouth 2 (two) times a day for 30 days. What changed:   medication strength  how much to take   Insulin Pen Needle 32G X 4 MM Misc Commonly known as: BD Pen Needle Nano U/F 1 each by Does not apply route daily.   losartan 100 MG tablet Commonly known as: COZAAR Take 100 mg by mouth every morning.   metFORMIN 500 MG tablet Commonly known as: GLUCOPHAGE Take 1,000 mg by mouth 2 (two) times daily with a meal.   OneTouch Verio test strip Generic drug: glucose blood 1 each by Other route 2 (two) times daily. Use as instructed   Soliqua 100-33 UNT-MCG/ML Sopn Generic drug: Insulin Glargine-Lixisenatide Inject 32 Units into the skin daily before breakfast.   VITAMIN A PO Take 1 capsule by mouth every Monday, Wednesday, and Friday.   VITAMIN B-1 PO Take 1 capsule by mouth every Monday, Wednesday, and Friday.   VITAMIN B-12 PO Take 1 capsule by mouth every Monday, Wednesday, and Friday.   Vitamin D (Ergocalciferol) 1.25 MG (50000 UT) Caps capsule Commonly known as: DRISDOL Take 1 capsule (50,000 Units total) by mouth every 7 (seven) days.            Durable Medical Equipment  (From admission, onward)         Start     Ordered   02/17/19 1618  For home use only DME Hospital bed  Once    Question Answer Comment  Length of Need Lifetime   The above medical condition requires: Patient requires the ability to reposition frequently   Head must be elevated greater than: 30 degrees   Bed type Semi-electric   Hoyer Lift Yes   Trapeze Bar Yes   Support Surface: Low Air loss Mattress      02/17/19 1618          Follow-up Information    Carol Ada, MD Follow up in 1 week(s).   Specialty: Family Medicine Contact information: [place] [id number] [phone]          No Known Allergies  Consultations: Neurology   Procedures/Studies: Ct Angio Head W Or Wo Contrast  Result Date: 02/18/2019 CLINICAL DATA:  Focal neuro deficit greater than 6 hours. Suspect stroke. EXAM: CT ANGIOGRAPHY HEAD AND NECK TECHNIQUE: Multidetector CT imaging of the head and neck was performed using the standard protocol during bolus administration of intravenous contrast. Multiplanar CT image reconstructions and MIPs were obtained to evaluate the vascular anatomy. Carotid stenosis measurements (when applicable) are obtained utilizing NASCET criteria, using the distal internal carotid diameter  as the denominator. CONTRAST:  61mL OMNIPAQUE IOHEXOL 350 MG/ML SOLN COMPARISON:  MRI head 02/15/2019 FINDINGS: CT HEAD FINDINGS Brain: No evidence of acute infarction, hemorrhage, hydrocephalus, extra-axial collection or mass lesion/mass effect. Vascular: Negative for hyperdense vessel Skull: Negative Sinuses: Mild mucosal edema paranasal sinuses. Orbits: Negative Review of the MIP images confirms the above findings CTA NECK FINDINGS Aortic arch: Standard branching. Imaged portion shows no evidence of aneurysm or dissection. No significant stenosis of the major arch vessel origins. Right carotid system: Normal right carotid system. Negative for stenosis or atherosclerotic disease Left carotid system: Normal left carotid system. Negative for stenosis or atherosclerotic disease Vertebral arteries: Normal vertebral arteries bilaterally without stenosis. Skeleton: Mild disc degeneration cervical spine. No acute skeletal abnormality. Other neck: Negative for mass or adenopathy in the neck. Upper chest: Lung apices clear bilaterally. Review of the MIP images confirms the above findings CTA HEAD FINDINGS Anterior  circulation: Cavernous carotid widely patent bilaterally without stenosis. Anterior and middle cerebral arteries normal bilaterally without stenosis or aneurysm Posterior circulation: Both vertebral arteries patent to the basilar. PICA patent bilaterally. Basilar widely patent. Superior cerebellar and posterior cerebral arteries patent bilaterally. Venous sinuses: Patent Anatomic variants: None Delayed phase: Not perform Review of the MIP images confirms the above findings IMPRESSION: Negative CTA head and neck. No intracranial or extracranial stenosis. Negative CT head Electronically Signed   By: Franchot Gallo M.D.   On: 02/18/2019 09:35   Ct Angio Neck W Or Wo Contrast  Result Date: 02/18/2019 CLINICAL DATA:  Focal neuro deficit greater than 6 hours. Suspect stroke. EXAM: CT ANGIOGRAPHY HEAD AND NECK TECHNIQUE: Multidetector CT imaging of the head and neck was performed using the standard protocol during bolus administration of intravenous contrast. Multiplanar CT image reconstructions and MIPs were obtained to evaluate the vascular anatomy. Carotid stenosis measurements (when applicable) are obtained utilizing NASCET criteria, using the distal internal carotid diameter as the denominator. CONTRAST:  26mL OMNIPAQUE IOHEXOL 350 MG/ML SOLN COMPARISON:  MRI head 02/15/2019 FINDINGS: CT HEAD FINDINGS Brain: No evidence of acute infarction, hemorrhage, hydrocephalus, extra-axial collection or mass lesion/mass effect. Vascular: Negative for hyperdense vessel Skull: Negative Sinuses: Mild mucosal edema paranasal sinuses. Orbits: Negative Review of the MIP images confirms the above findings CTA NECK FINDINGS Aortic arch: Standard branching. Imaged portion shows no evidence of aneurysm or dissection. No significant stenosis of the major arch vessel origins. Right carotid system: Normal right carotid system. Negative for stenosis or atherosclerotic disease Left carotid system: Normal left carotid system. Negative for  stenosis or atherosclerotic disease Vertebral arteries: Normal vertebral arteries bilaterally without stenosis. Skeleton: Mild disc degeneration cervical spine. No acute skeletal abnormality. Other neck: Negative for mass or adenopathy in the neck. Upper chest: Lung apices clear bilaterally. Review of the MIP images confirms the above findings CTA HEAD FINDINGS Anterior circulation: Cavernous carotid widely patent bilaterally without stenosis. Anterior and middle cerebral arteries normal bilaterally without stenosis or aneurysm Posterior circulation: Both vertebral arteries patent to the basilar. PICA patent bilaterally. Basilar widely patent. Superior cerebellar and posterior cerebral arteries patent bilaterally. Venous sinuses: Patent Anatomic variants: None Delayed phase: Not perform Review of the MIP images confirms the above findings IMPRESSION: Negative CTA head and neck. No intracranial or extracranial stenosis. Negative CT head Electronically Signed   By: Franchot Gallo M.D.   On: 02/18/2019 09:35   Mr Jeri Cos YS Contrast  Result Date: 02/16/2019 CLINICAL DATA:  63 y/o F; sudden onset bilateral lower extremity weakness. EXAM: MRI HEAD WITHOUT  AND WITH CONTRAST MRI CERVICAL SPINE WITHOUT AND WITH CONTRAST TECHNIQUE: Multiplanar, multiecho pulse sequences of the brain and surrounding structures, and cervical spine, to include the craniocervical junction and cervicothoracic junction, were obtained without and with intravenous contrast. CONTRAST:  10 cc Gadavist COMPARISON:  02/14/2019 thoracic and lumbar spine MRI. FINDINGS: MRI HEAD FINDINGS Brain: No acute infarction, hemorrhage, hydrocephalus, extra-axial collection or mass lesion. No structural or signal abnormality of the brain identified. After administration of intravenous contrast there is no abnormal enhancement. Vascular: Normal flow voids. Skull and upper cervical spine: Normal marrow signal. Sinuses/Orbits: Left maxillary sinus mucous retention  cyst. Additional included paranasal sinuses and the mastoid air cells are normally aerated. Orbits are unremarkable. Other: None. MRI CERVICAL SPINE FINDINGS Alignment: Straightening of cervical lordosis without listhesis. Vertebrae: No fracture, evidence of discitis, or bone lesion. After administration of intravenous contrast there is no abnormal enhancement. Cord: Normal signal and morphology. After administration of intravenous contrast there is no abnormal enhancement. Posterior Fossa, vertebral arteries, paraspinal tissues: As above. Disc levels: C2-3: Mild disc bulge. No significant foraminal or spinal canal stenosis. C3-4: Disc osteophyte complex with right greater than left uncovertebral and facet hypertrophy. Mild right neural foraminal stenosis. Disc contact on the anterior cord. No significant spinal canal stenosis. C4-5: Disc osteophyte complex with bilateral uncovertebral and facet hypertrophy. Mild bilateral neural foraminal stenosis. No significant spinal canal stenosis. C5-6: Disc osteophyte complex with left-greater-than-right uncovertebral and facet hypertrophy. Mild right and moderate left neural foraminal stenosis. No significant spinal canal stenosis. C6-7: Mild disc bulge. No significant foraminal or spinal canal stenosis. C7-T1: No significant disc displacement, foraminal stenosis, or canal stenosis. IMPRESSION: MRI of the brain: No acute intracranial abnormality or findings of demyelination. Unremarkable MRI of the brain. MRI of the cervical spine: 1. No cervical spinal cord lesion or abnormal enhancement. 2. Cervical spondylosis greatest at the C5-6 level where there is moderate left-sided neural foraminal stenosis. Multilevel mild neural foraminal stenosis. No significant spinal canal stenosis. Electronically Signed   By: Kristine Garbe M.D.   On: 02/16/2019 01:22   Mr Lumbar Spine Wo Contrast  Result Date: 02/13/2019 CLINICAL DATA:  Back pain.  Cauda equina syndrome  suspected. EXAM: MRI LUMBAR SPINE WITHOUT CONTRAST TECHNIQUE: Multiplanar, multisequence MR imaging of the lumbar spine was performed. No intravenous contrast was administered. COMPARISON:  None. FINDINGS: Segmentation:  Normal Alignment: Mild retrolisthesis L1-2, L2-3, L3-4. 5 mm anterolisthesis L4-5. Vertebrae:  Negative for fracture or mass. Conus medullaris and cauda equina: Conus extends to the L1-2 level. Ill-defined hyperintense signal throughout the conus medullaris without significant expansion. Axial images not obtained through this level. Paraspinal and other soft tissues: Negative for paraspinous mass or adenopathy. Abdominal aorta normal in caliber Disc levels: L1-2: Mild disc degeneration without stenosis. Shallow left paracentral disc protrusion. L3-4: Mild disc bulging and facet degeneration. Negative for stenosis L3-4: Mild disc and facet degeneration.  Negative for stenosis L4-5: 5 mm anterolisthesis. Severe facet degeneration bilaterally. Moderately severe spinal stenosis and moderate subarticular stenosis bilaterally. L5-S1: Mild facet degeneration bilaterally.  Negative for stenosis. IMPRESSION: 1. Conus medullaris is abnormal with diffuse hyperintense signal. Limited evaluation on this study and further evaluation recommended. Differential diagnosis includes ischemia, demyelinating disease, and neoplasm. Recommend follow-up lumbar MRI with contrast and thoracic MRI without and with contrast for further evaluation. 2. Multilevel degenerative changes in the lumbar spine most severe at L4-5 where there is moderate spinal stenosis. 3. These results were called by telephone at the time of interpretation on 02/13/2019  at 6:30 pm to Dr. Milton Ferguson , who verbally acknowledged these results. Electronically Signed   By: Franchot Gallo M.D.   On: 02/13/2019 18:30   Mr Lumbar Spine W Contrast  Result Date: 02/14/2019 CLINICAL DATA:  Bilateral leg weakness. Incontinent of urine. Diabetes. Conus  medullaris lesion on MRI. EXAM: MRI THORACIC WITHOUT AND WITH CONTRAST MRI LUMBAR WITH CONTRAST TECHNIQUE: Multiplanar and multiecho pulse sequences of the thoracic and lumbar spine were obtained without and with intravenous contrast. CONTRAST:  10 mL Gadovist IV COMPARISON:  Lumbar MRI 02/13/2019 FINDINGS: MRI THORACIC SPINE FINDINGS Alignment:  Normal Vertebrae: Normal bone marrow. Negative for fracture or mass in the spine. Cord: Abnormal signal in the conus medullaris and distal spinal cord. This was noted on lumbar MRI yesterday. No expansion of the cord. No abnormal enhancement in this area. Remainder of the cord is normal. No cord compression. Postcontrast imaging reveals several small enhancing vessels around the distal spinal cord which are felt to be within normal limits. No evidence of vascular malformation. Paraspinal and other soft tissues: Negative for paraspinous mass edema or fluid collection Disc levels: Mild thoracic disc degeneration. No significant spinal stenosis. Mild facet degeneration at T9-10 T10-11, T11-T12 without significant spinal stenosis. MRI LUMBAR SPINE FINDINGS Postcontrast imaging of the lumbar spine was performed and compared with the unenhanced study from yesterday. T2 hyperintensity in the conus medullaris is not show any abnormal enhancement. No underlying mass or vascular malformation is identified. No enhancing lesion in the thecal sac or cauda equina identified. Lumbar disc degeneration most prominent L4-5 causing moderate spinal stenosis. See prior MRI report for detailed description. Distended urinary bladder consistent with urinary retention. IMPRESSION: 1. Lesion of the conus medullaris and distal spinal cord does not enhance or show expansion. No evidence of underlying neoplasm or vascular malformation. This is felt to be the cause of the patient's symptoms and most likely is an area of acute demyelinization. Possible multiple sclerosis or transverse myelitis. No other  cord lesions. Recommend MRI brain without and with contrast. 2. Distended urinary bladder suggesting urinary retention. Recommend Foley catheter. Electronically Signed   By: Franchot Gallo M.D.   On: 02/14/2019 07:21   Mr Cervical Spine W Wo Contrast  Result Date: 02/16/2019 CLINICAL DATA:  63 y/o F; sudden onset bilateral lower extremity weakness. EXAM: MRI HEAD WITHOUT AND WITH CONTRAST MRI CERVICAL SPINE WITHOUT AND WITH CONTRAST TECHNIQUE: Multiplanar, multiecho pulse sequences of the brain and surrounding structures, and cervical spine, to include the craniocervical junction and cervicothoracic junction, were obtained without and with intravenous contrast. CONTRAST:  10 cc Gadavist COMPARISON:  02/14/2019 thoracic and lumbar spine MRI. FINDINGS: MRI HEAD FINDINGS Brain: No acute infarction, hemorrhage, hydrocephalus, extra-axial collection or mass lesion. No structural or signal abnormality of the brain identified. After administration of intravenous contrast there is no abnormal enhancement. Vascular: Normal flow voids. Skull and upper cervical spine: Normal marrow signal. Sinuses/Orbits: Left maxillary sinus mucous retention cyst. Additional included paranasal sinuses and the mastoid air cells are normally aerated. Orbits are unremarkable. Other: None. MRI CERVICAL SPINE FINDINGS Alignment: Straightening of cervical lordosis without listhesis. Vertebrae: No fracture, evidence of discitis, or bone lesion. After administration of intravenous contrast there is no abnormal enhancement. Cord: Normal signal and morphology. After administration of intravenous contrast there is no abnormal enhancement. Posterior Fossa, vertebral arteries, paraspinal tissues: As above. Disc levels: C2-3: Mild disc bulge. No significant foraminal or spinal canal stenosis. C3-4: Disc osteophyte complex with right greater than left uncovertebral  and facet hypertrophy. Mild right neural foraminal stenosis. Disc contact on the anterior  cord. No significant spinal canal stenosis. C4-5: Disc osteophyte complex with bilateral uncovertebral and facet hypertrophy. Mild bilateral neural foraminal stenosis. No significant spinal canal stenosis. C5-6: Disc osteophyte complex with left-greater-than-right uncovertebral and facet hypertrophy. Mild right and moderate left neural foraminal stenosis. No significant spinal canal stenosis. C6-7: Mild disc bulge. No significant foraminal or spinal canal stenosis. C7-T1: No significant disc displacement, foraminal stenosis, or canal stenosis. IMPRESSION: MRI of the brain: No acute intracranial abnormality or findings of demyelination. Unremarkable MRI of the brain. MRI of the cervical spine: 1. No cervical spinal cord lesion or abnormal enhancement. 2. Cervical spondylosis greatest at the C5-6 level where there is moderate left-sided neural foraminal stenosis. Multilevel mild neural foraminal stenosis. No significant spinal canal stenosis. Electronically Signed   By: Kristine Garbe M.D.   On: 02/16/2019 01:22   Mr Thoracic Spine W Wo Contrast  Result Date: 02/14/2019 CLINICAL DATA:  Bilateral leg weakness. Incontinent of urine. Diabetes. Conus medullaris lesion on MRI. EXAM: MRI THORACIC WITHOUT AND WITH CONTRAST MRI LUMBAR WITH CONTRAST TECHNIQUE: Multiplanar and multiecho pulse sequences of the thoracic and lumbar spine were obtained without and with intravenous contrast. CONTRAST:  10 mL Gadovist IV COMPARISON:  Lumbar MRI 02/13/2019 FINDINGS: MRI THORACIC SPINE FINDINGS Alignment:  Normal Vertebrae: Normal bone marrow. Negative for fracture or mass in the spine. Cord: Abnormal signal in the conus medullaris and distal spinal cord. This was noted on lumbar MRI yesterday. No expansion of the cord. No abnormal enhancement in this area. Remainder of the cord is normal. No cord compression. Postcontrast imaging reveals several small enhancing vessels around the distal spinal cord which are felt to be  within normal limits. No evidence of vascular malformation. Paraspinal and other soft tissues: Negative for paraspinous mass edema or fluid collection Disc levels: Mild thoracic disc degeneration. No significant spinal stenosis. Mild facet degeneration at T9-10 T10-11, T11-T12 without significant spinal stenosis. MRI LUMBAR SPINE FINDINGS Postcontrast imaging of the lumbar spine was performed and compared with the unenhanced study from yesterday. T2 hyperintensity in the conus medullaris is not show any abnormal enhancement. No underlying mass or vascular malformation is identified. No enhancing lesion in the thecal sac or cauda equina identified. Lumbar disc degeneration most prominent L4-5 causing moderate spinal stenosis. See prior MRI report for detailed description. Distended urinary bladder consistent with urinary retention. IMPRESSION: 1. Lesion of the conus medullaris and distal spinal cord does not enhance or show expansion. No evidence of underlying neoplasm or vascular malformation. This is felt to be the cause of the patient's symptoms and most likely is an area of acute demyelinization. Possible multiple sclerosis or transverse myelitis. No other cord lesions. Recommend MRI brain without and with contrast. 2. Distended urinary bladder suggesting urinary retention. Recommend Foley catheter. Electronically Signed   By: Franchot Gallo M.D.   On: 02/14/2019 07:21   Dg Chest Port 1 View  Result Date: 02/13/2019 CLINICAL DATA:  Abnormal imaging of central nervous system. Cauda equina syndrome suspected. Fall. Loss of consciousness. EXAM: PORTABLE CHEST 1 VIEW COMPARISON:  Radiograph 04/21/2010 FINDINGS: Upper normal heart size. Normal mediastinal contours. The lungs are clear. Slightly vision of right hemidiaphragm, unchanged from prior. Pulmonary vasculature is normal. No consolidation, pleural effusion, or pneumothorax. No acute osseous abnormalities are seen. Degenerative change of the right shoulder.  IMPRESSION: 1. Borderline cardiomegaly. 2.  No acute pulmonary process. Electronically Signed   By: Threasa Beards  Sanford M.D.   On: 02/13/2019 20:54   Vas [place] Lower Extremity Venous (dvt)  Result Date: 02/14/2019  Lower Venous Study Indications: Edema.  Limitations: Body habitus, poor ultrasound/tissue interface and patient guarding secondary to pain. Comparison Study: No prior study. Performing Technologist: Maudry Mayhew RDMS, RVT, RDCS  Examination Guidelines: A complete evaluation includes B-mode imaging, spectral Doppler, color Doppler, and power Doppler as needed of all accessible portions of each vessel. Bilateral testing is considered an integral part of a complete examination. Limited examinations for reoccurring indications may be performed as noted.  +-----+---------------+---------+-----------+----------+--------------+ RIGHTCompressibilityPhasicitySpontaneityPropertiesSummary        +-----+---------------+---------+-----------+----------+--------------+ CFV                                               Not visualized +-----+---------------+---------+-----------+----------+--------------+   +---------+---------------+---------+-----------+----------+------------------+ LEFT     CompressibilityPhasicitySpontaneityPropertiesSummary            +---------+---------------+---------+-----------+----------+------------------+ CFV      Full           Yes      Yes                                     +---------+---------------+---------+-----------+----------+------------------+ SFJ      Full                                                            +---------+---------------+---------+-----------+----------+------------------+ FV Prox  Full                                                            +---------+---------------+---------+-----------+----------+------------------+ FV Mid   Full                                                             +---------+---------------+---------+-----------+----------+------------------+ FV Distal                                             patent by color                                                          Doppler            +---------+---------------+---------+-----------+----------+------------------+ POP      Full           Yes      Yes                                     +---------+---------------+---------+-----------+----------+------------------+  PTV      Full                                                            +---------+---------------+---------+-----------+----------+------------------+ PERO                                                  Not visualized     +---------+---------------+---------+-----------+----------+------------------+ Patient unable to tolerate some compression maneuvers.    Summary: Left: There is no evidence of deep vein thrombosis in the lower extremity. However, portions of this examination were limited- see technologist comments above. No cystic structure found in the popliteal fossa.  *See table(s) above for measurements and observations. Electronically signed by Deitra Mayo MD on 02/14/2019 at 3:42:20 PM.    Final    Dg Fl Guided Lumbar Puncture  Result Date: 02/14/2019 CLINICAL DATA:  Bilateral leg weakness and urine incontinence. Diabetes. Conus lesion on MRI. Failed bedside lumbar puncture. EXAM: DIAGNOSTIC LUMBAR PUNCTURE UNDER FLUOROSCOPIC GUIDANCE FLUOROSCOPY TIME:  Fluoroscopy Time: 6 seconds of low-dose pulsed fluoro Radiation Exposure Index (if provided by the fluoroscopic device): 14.6 mGy Number of Acquired Spot Images: 2 spot images. PROCEDURE: Previous MRIs were reviewed.  Time out procedure was performed. Informed consent was obtained from the patient prior to the procedure, including potential complications of headache, allergy, and pain. With the patient prone, the lower back was prepped with Betadine. 1%  Lidocaine was used for local anesthesia. Lumbar puncture was performed at the L3-4 level (patient has transitional lumbosacral anatomy with a transitional S1 level) using a 12.5 cm, 20 gauge needle with return of clear, colorless CSF with an opening pressure of 22.5 cm water. Twelve ml of CSF were obtained for laboratory studies. The patient tolerated the procedure well and there were no apparent complications. IMPRESSION: Diagnostic lumbar puncture performed without immediate complications. Electronically Signed   By: Richardean Sale M.D.   On: 02/14/2019 16:43      Discharge Exam: Vitals:   02/20/19 0816 02/20/19 1137  BP: (!) 129/95 119/81  Pulse: 75 (!) 102  Resp: 16 17  Temp: 98.2 F (36.8 C) 97.7 F (36.5 C)  SpO2: 99% 97%   Vitals:   02/19/19 2306 02/20/19 0555 02/20/19 0816 02/20/19 1137  BP: 114/79 128/77 (!) 129/95 119/81  Pulse: 68 63 75 (!) 102  Resp: 18 18 16 17   Temp: 97.6 F (36.4 C) (!) 97.5 F (36.4 C) 98.2 F (36.8 C) 97.7 F (36.5 C)  TempSrc: Oral Oral Oral Oral  SpO2: 98% 98% 99% 97%  Weight:      Height:        General: Pt is alert, awake, not in acute distress Cardiovascular: RRR, S1/S2 +, no rubs, no gallops Respiratory: CTA bilaterally, no wheezing, no rhonchi Abdominal: Soft, NT, ND, bowel sounds + Extremities: no edema, no cyanosis    The results of significant diagnostics from this hospitalization (including imaging, microbiology, ancillary and laboratory) are listed below for reference.     Microbiology: Recent Results (from the past 240 hour(s))  SARS Coronavirus 2 (CEPHEID - Performed in [facility] hospital lab), Hosp Order     Status: None  Collection Time: 02/13/19  8:29 PM   Specimen: Nasopharyngeal Swab  Result Value Ref Range Status   SARS Coronavirus 2 NEGATIVE NEGATIVE Final    Comment: (NOTE) If result is NEGATIVE SARS-CoV-2 target nucleic acids are NOT DETECTED. The SARS-CoV-2 RNA is generally detectable in upper and lower   respiratory specimens during the acute phase of infection. The lowest  concentration of SARS-CoV-2 viral copies this assay can detect is 250  copies / mL. A negative result does not preclude SARS-CoV-2 infection  and should not be used as the sole basis for treatment or other  patient management decisions.  A negative result may occur with  improper specimen collection / handling, submission of specimen other  than nasopharyngeal swab, presence of viral mutation(s) within the  areas targeted by this assay, and inadequate number of viral copies  (<250 copies / mL). A negative result must be combined with clinical  observations, patient history, and epidemiological information. If result is POSITIVE SARS-CoV-2 target nucleic acids are DETECTED. The SARS-CoV-2 RNA is generally detectable in upper and lower  respiratory specimens dur ing the acute phase of infection.  Positive  results are indicative of active infection with SARS-CoV-2.  Clinical  correlation with patient history and other diagnostic information is  necessary to determine patient infection status.  Positive results do  not rule out bacterial infection or co-infection with other viruses. If result is PRESUMPTIVE POSTIVE SARS-CoV-2 nucleic acids MAY BE PRESENT.   A presumptive positive result was obtained on the submitted specimen  and confirmed on repeat testing.  While 2019 novel coronavirus  (SARS-CoV-2) nucleic acids may be present in the submitted sample  additional confirmatory testing may be necessary for epidemiological  and / or clinical management purposes  to differentiate between  SARS-CoV-2 and other Sarbecovirus currently known to infect humans.  If clinically indicated additional testing with an alternate test  methodology [phone]) is advised. The SARS-CoV-2 RNA is generally  detectable in upper and lower respiratory sp ecimens during the acute  phase of infection. The expected result is Negative. Fact  Sheet for Patients:  StrictlyIdeas.no Fact Sheet for Healthcare Providers: BankingDealers.co.za This test is not yet approved or cleared by the [place] FDA and has been authorized for detection and/or diagnosis of SARS-CoV-2 by FDA under an Emergency Use Authorization (EUA).  This EUA will remain in effect (meaning this test can be used) for the duration of the COVID-19 declaration under Section 564(b)(1) of the Act, 21 U.S.C. section 360bbb-3(b)(1), unless the authorization is terminated or revoked sooner. Performed at [facility], [place] [address] [place], [place] [address]   Anaerobic culture     Status: None   Collection Time: 02/14/19  4:21 PM   Specimen: CSF; Cerebrospinal Fluid  Result Value Ref Range Status   Specimen Description CSF  Final   Special Requests NONE  Final   Culture   Final    NO ANAEROBES ISOLATED Performed at [facility], [place] [address], [place], [place] [address]    Report Status 02/19/2019 FINAL  Final  CSF culture     Status: None   Collection Time: 02/14/19  4:21 PM   Specimen: CSF; Cerebrospinal Fluid  Result Value Ref Range Status   Specimen Description CSF  Final   Special Requests NONE  Final   Gram Stain   Final    WBC PRESENT,BOTH PMN AND MONONUCLEAR NO ORGANISMS SEEN CYTOSPIN SMEAR    Culture   Final  NO GROWTH Performed at [facility], [place] [address] [place], [place] [address]    Report Status 02/17/2019 FINAL  Final  Fungus Culture With Stain     Status: None (Preliminary result)   Collection Time: 02/14/19  4:21 PM   Specimen: CSF; Cerebrospinal Fluid  Result Value Ref Range Status   Fungus Stain Final report  Final    Comment: (NOTE) Performed At: [facility] [place], [place] [id number] Rush Farmer MD [id number]    Fungus (Mycology) Culture PENDING  Incomplete   Fungal Source CSF  Final    Comment: Performed  at [facility], [place] [address], [place], [place] [address]  Fungus Culture Result     Status: None   Collection Time: 02/14/19  4:21 PM  Result Value Ref Range Status   Result 1 Comment  Final    Comment: (NOTE) KOH/Calcofluor preparation:  no fungus observed. Performed At: [facility] [place], [place] [id number] Rush Farmer MD [id number]      Labs: BNP (last 3 results) No results for input(s): BNP in the last 8760 hours. Basic Metabolic Panel: Recent Labs  Lab 02/13/19 1555 02/14/19 0809 02/14/19 1414 02/15/19 0454  NA 141 140 141 139  K 3.6 5.8* 3.4* 3.7  CL 107 106 105 105  CO2 26 22 27 24   GLUCOSE 198* 122* 173* 114*  BUN 14 11 9 11   CREATININE 0.71 0.61 0.69 0.54  CALCIUM 9.7 9.2 9.7 9.5  MG  --  2.1  --   --   PHOS  --  3.4  --   --    Liver Function Tests: Recent Labs  Lab 02/13/19 1555 02/14/19 0809  AST 15 42*  ALT 18 26  ALKPHOS 67 57  BILITOT 0.4 1.2  PROT 7.4 6.2*  ALBUMIN 3.9 3.1*   No results for input(s): LIPASE, AMYLASE in the last 168 hours. No results for input(s): AMMONIA in the last 168 hours. CBC: Recent Labs  Lab 02/13/19 1555 02/14/19 1011 02/15/19 0454  WBC 7.5 6.1 7.9  NEUTROABS 5.8  --  5.9  HGB 12.8 12.5 12.7  HCT 40.6 39.5 39.6  MCV 86.2 83.7 83.7  PLT 232 202 180   Cardiac Enzymes: Recent Labs  Lab 02/14/19 0436 02/14/19 0809 02/14/19 1414  TROPONINI <0.03 <0.03 <0.03   BNP: Invalid input(s): POCBNP CBG: Recent Labs  Lab 02/19/19 1133 02/19/19 1650 02/19/19 2141 02/20/19 0634 02/20/19 1135  GLUCAP 321* 399* 428* 218* 183*   D-Dimer No results for input(s): DDIMER in the last 72 hours. Hgb A1c No results for input(s): HGBA1C in the last 72 hours. Lipid Profile Recent Labs    02/18/19 1128  CHOL 163  HDL 60  LDLCALC 81  TRIG 110  CHOLHDL 2.7   Thyroid function studies No results for input(s): TSH, T4TOTAL, T3FREE, THYROIDAB in the last 72 hours.  Invalid  input(s): FREET3 Anemia work up No results for input(s): VITAMINB12, FOLATE, FERRITIN, TIBC, IRON, RETICCTPCT in the last 72 hours. Urinalysis    Component Value Date/Time   COLORURINE YELLOW 11/26/2015 0759   APPEARANCEUR CLOUDY (A) 11/26/2015 0759   LABSPEC 1.029 11/26/2015 0759   PHURINE 6.0 11/26/2015 0759   GLUCOSEU NEGATIVE 11/26/2015 0759   HGBUR NEGATIVE 11/26/2015 0759   BILIRUBINUR NEGATIVE 11/26/2015 0759   KETONESUR NEGATIVE 11/26/2015 0759   PROTEINUR NEGATIVE 11/26/2015 0759   UROBILINOGEN 0.2 04/21/2010 0930   NITRITE NEGATIVE 11/26/2015 0759   LEUKOCYTESUR  TRACE (A) 11/26/2015 0759   Sepsis Labs Invalid input(s): PROCALCITONIN,  WBC,  LACTICIDVEN Microbiology Recent Results (from the past 240 hour(s))  SARS Coronavirus 2 (CEPHEID - Performed in [facility] hospital lab), Hosp Order     Status: None   Collection Time: 02/13/19  8:29 PM   Specimen: Nasopharyngeal Swab  Result Value Ref Range Status   SARS Coronavirus 2 NEGATIVE NEGATIVE Final    Comment: (NOTE) If result is NEGATIVE SARS-CoV-2 target nucleic acids are NOT DETECTED. The SARS-CoV-2 RNA is generally detectable in upper and lower  respiratory specimens during the acute phase of infection. The lowest  concentration of SARS-CoV-2 viral copies this assay can detect is 250  copies / mL. A negative result does not preclude SARS-CoV-2 infection  and should not be used as the sole basis for treatment or other  patient management decisions.  A negative result may occur with  improper specimen collection / handling, submission of specimen other  than nasopharyngeal swab, presence of viral mutation(s) within the  areas targeted by this assay, and inadequate number of viral copies  (<250 copies / mL). A negative result must be combined with clinical  observations, patient history, and epidemiological information. If result is POSITIVE SARS-CoV-2 target nucleic acids are DETECTED. The SARS-CoV-2 RNA is  generally detectable in upper and lower  respiratory specimens dur ing the acute phase of infection.  Positive  results are indicative of active infection with SARS-CoV-2.  Clinical  correlation with patient history and other diagnostic information is  necessary to determine patient infection status.  Positive results do  not rule out bacterial infection or co-infection with other viruses. If result is PRESUMPTIVE POSTIVE SARS-CoV-2 nucleic acids MAY BE PRESENT.   A presumptive positive result was obtained on the submitted specimen  and confirmed on repeat testing.  While 2019 novel coronavirus  (SARS-CoV-2) nucleic acids may be present in the submitted sample  additional confirmatory testing may be necessary for epidemiological  and / or clinical management purposes  to differentiate between  SARS-CoV-2 and other Sarbecovirus currently known to infect humans.  If clinically indicated additional testing with an alternate test  methodology [phone]) is advised. The SARS-CoV-2 RNA is generally  detectable in upper and lower respiratory sp ecimens during the acute  phase of infection. The expected result is Negative. Fact Sheet for Patients:  StrictlyIdeas.no Fact Sheet for Healthcare Providers: BankingDealers.co.za This test is not yet approved or cleared by the [place] FDA and has been authorized for detection and/or diagnosis of SARS-CoV-2 by FDA under an Emergency Use Authorization (EUA).  This EUA will remain in effect (meaning this test can be used) for the duration of the COVID-19 declaration under Section 564(b)(1) of the Act, 21 U.S.C. section 360bbb-3(b)(1), unless the authorization is terminated or revoked sooner. Performed at [facility], [place] [address] [place], [place] [address]   Anaerobic culture     Status: None   Collection Time: 02/14/19  4:21 PM   Specimen: CSF; Cerebrospinal Fluid  Result  Value Ref Range Status   Specimen Description CSF  Final   Special Requests NONE  Final   Culture   Final    NO ANAEROBES ISOLATED Performed at [facility], [place] [address] [place], [place] [address]    Report Status 02/19/2019 FINAL  Final  CSF culture     Status: None   Collection Time: 02/14/19  4:21 PM   Specimen: CSF; Cerebrospinal Fluid  Result Value Ref  Range Status   Specimen Description CSF  Final   Special Requests NONE  Final   Gram Stain   Final    WBC PRESENT,BOTH PMN AND MONONUCLEAR NO ORGANISMS SEEN CYTOSPIN SMEAR    Culture   Final    NO GROWTH Performed at [facility], [address] [place], [place] [address]    Report Status 02/17/2019 FINAL  Final  Fungus Culture With Stain     Status: None (Preliminary result)   Collection Time: 02/14/19  4:21 PM   Specimen: CSF; Cerebrospinal Fluid  Result Value Ref Range Status   Fungus Stain Final report  Final    Comment: (NOTE) Performed At: [facility] [place], [place] [id number] Rush Farmer MD [id number]    Fungus (Mycology) Culture PENDING  Incomplete   Fungal Source CSF  Final    Comment: Performed at [facility], [place] [address], [place], [place] [address]  Fungus Culture Result     Status: None   Collection Time: 02/14/19  4:21 PM  Result Value Ref Range Status   Result 1 Comment  Final    Comment: (NOTE) KOH/Calcofluor preparation:  no fungus observed. Performed At: [facility] [place], [place] [id number] Rush Farmer MD [id number]      Time coordinating discharge: 43 minutes  SIGNED:   Darliss Cheney, MD  [facility] 02/20/2019, 1:36 PM Pager [phone]  If 7PM-7AM, please contact night-coverage [email or web address] Password [email or web address]

## 2019-02-20 NOTE — Progress Notes (Signed)
Test done in ED ordered by different provider.

## 2019-02-21 ENCOUNTER — Telehealth: Payer: Self-pay | Admitting: *Deleted

## 2019-02-21 NOTE — Telephone Encounter (Signed)
[employer] to ship a 30 day cardiac event monitor to her home.  Instructions reviewed briefly as they are included in the monitor kit.  Patient states, she has requested Dr. Daneen Schick as her cardiologist, and Dr. Arlice Colt as her neurologist.  PCP Dr. Carol Ada to send referrals.

## 2019-03-01 ENCOUNTER — Encounter (INDEPENDENT_AMBULATORY_CARE_PROVIDER_SITE_OTHER): Payer: 59

## 2019-03-01 DIAGNOSIS — I639 Cerebral infarction, unspecified: Secondary | ICD-10-CM | POA: Diagnosis not present

## 2019-03-01 DIAGNOSIS — I4891 Unspecified atrial fibrillation: Secondary | ICD-10-CM | POA: Diagnosis not present

## 2019-03-01 DIAGNOSIS — R55 Syncope and collapse: Secondary | ICD-10-CM | POA: Diagnosis not present

## 2019-03-02 ENCOUNTER — Telehealth: Payer: Self-pay | Admitting: Interventional Cardiology

## 2019-03-02 NOTE — Telephone Encounter (Signed)
New Message    Patient calling for new patient appt and has to wear a 30 day heart monitor.  She really wants to just see Dr. Tamala Julian but there were no new patient slots on his schedule for August.  She would like you to see if she can be fit in any day in August.

## 2019-03-06 NOTE — Telephone Encounter (Signed)
Spoke with Laura Blackwell and she had monitor placed 03/01/2019.  Scheduled Laura Blackwell to see Dr. Tamala Julian 04/14/2019.

## 2019-03-16 LAB — FUNGUS CULTURE RESULT

## 2019-03-16 LAB — FUNGAL ORGANISM REFLEX

## 2019-03-16 LAB — FUNGUS CULTURE WITH STAIN

## 2019-03-29 ENCOUNTER — Encounter: Payer: Self-pay | Admitting: Neurology

## 2019-03-29 ENCOUNTER — Other Ambulatory Visit: Payer: Self-pay | Admitting: Neurology

## 2019-03-29 ENCOUNTER — Other Ambulatory Visit: Payer: Self-pay

## 2019-03-29 ENCOUNTER — Ambulatory Visit: Payer: 59 | Admitting: Neurology

## 2019-03-29 VITALS — BP 145/78 | HR 104 | Temp 96.2°F | Ht 67.0 in | Wt 297.0 lb

## 2019-03-29 DIAGNOSIS — G9511 Acute infarction of spinal cord (embolic) (nonembolic): Secondary | ICD-10-CM | POA: Diagnosis not present

## 2019-03-29 DIAGNOSIS — R269 Unspecified abnormalities of gait and mobility: Secondary | ICD-10-CM | POA: Diagnosis not present

## 2019-03-29 DIAGNOSIS — R208 Other disturbances of skin sensation: Secondary | ICD-10-CM | POA: Diagnosis not present

## 2019-03-29 DIAGNOSIS — E119 Type 2 diabetes mellitus without complications: Secondary | ICD-10-CM | POA: Diagnosis not present

## 2019-03-29 NOTE — Progress Notes (Signed)
[facility]  PATIENT: Laura Blackwell DOB: 08-Oct-1955  REFERRING DOCTOR OR PCP: Carol Ada, MD SOURCE: Patient, Notes from Dr. Tamala Julian, imaging and lab reports, multiple MRIs of the spine and brain personally reviewed  _________________________________   HISTORICAL  CHIEF COMPLAINT:  Chief Complaint  Patient presents with   New Patient (Initial Visit)    RM 13, alone. Paper referral for spinal cord stroke. Was at [facility] 02/13/19-02/20/19. This was the first even. [facility] coming out 3x per week for therapy right now.    Gait Problem    Ambulates with walker. Has tried to use cane at home. Reports her legs get very cold. Hard to warm up.     HISTORY OF PRESENT ILLNESS:  I had the pleasure of seeing your patient, Laura Blackwell, at [facility] for neurologic consultation regarding her lower spinal cord lesion.  She is a 63 year old woman who had the onset of lower back pain and leg weakness 02/10/2019. She was at work, when she stood up she fell flat on her face.    She did not get better over the next 2 days and also had urinary incontinence and presented to the ED 02/13/19.  She was noted to have no sensation in the perineum and sacral area.  She had incontinence of bowel and bladder. She had some left leg weakness.    She was admitted and MRI showed an abnormal focus in the conus medullaris and lower spinal cord adjacent to T12-L1.  This was felt to be a demyelinating lesion versus stroke and she received 5 days of high-dose IV steroids.  While in the hospital she had a lumbar puncture.  Oligoclonal bands were not present.  She notes improving since she received the steroids.   She is now able to walk some steps without a cane but uses a walker due to poor balance.   In PT she practiced using a cane.   She still has numbness and also feels a cold sensation in her legs/groin/buttocks.    She never had any numbness or weakness in the arms  or chest/abdomen.   She notes some improvement in sensation but still has near total numbness in the groin/buttocks.   She now is starting to get a signal that she needs to use the bathroom.   She has not had incontinence the last few weeks.     She does not recall any infections in th presenting few weeks and had no vaccinations.    She has had DM x 7 years and has ben on insulin x 1 year.   She denies any numbness in her feet or eye or kidney issues related to DM   The following MRIs and CTs were reviewed: 02/13/2019: MRI of the lumbar spine showed abnormal signal in the conus medullaris.  There is severe facet hypertrophy with 5 mm of anterolisthesis and moderately severe spinal stenosis at L4-L5.  Mild degenerative changes at the other levels. 02/14/2019: MRI of the thoracic (with and without) and lumbar spine (with contrast)  There is a lesion of the conus medullaris and distal thoracic spinal cord.  It does not enhance.   02/16/2019: MRI of the brain and cervical spine.  The brain was normal for age.  The spinal cord was normal.  There was some degenerative changes noted at the C5-C6 with left foraminal narrowing. 02/18/2019: CT angiogram of the head and neck were normal.  NMO-IgG Ab was negative.   HIV negative.  ESR mildly elevated at 32.  CSF showed no oligoclonal bands.   CSF proteins and glucose mildly elevated.  7 WBC.   CSF VDRL negative.      REVIEW OF SYSTEMS: Constitutional: No fevers, chills, sweats, or change in appetite Eyes: No visual changes, double vision, eye pain Ear, nose and throat: No hearing loss, ear pain, nasal congestion, sore throat Cardiovascular: No chest pain, palpitations Respiratory: No shortness of breath at rest or with exertion.   No wheezes GastrointestinaI: No nausea, vomiting, diarrhea, abdominal pain, fecal incontinence Genitourinary: No dysuria, urinary retention or frequency.  No nocturia.  As above musculoskeletal: No neck pain, back  pain Integumentary: No rash, pruritus, skin lesions Neurological: as above Psychiatric: No depression at this time.  No anxiety Endocrine: No palpitations, diaphoresis, change in appetite, change in weigh or increased thirst.  She has insulin-dependent type 2 diabetes. Hematologic/Lymphatic: No anemia, purpura, petechiae. Allergic/Immunologic: No itchy/runny eyes, nasal congestion, recent allergic reactions, rashes  ALLERGIES: No Known Allergies  HOME MEDICATIONS:  Current Outpatient Medications:    amLODipine (NORVASC) 5 MG tablet, Take 5 mg by mouth daily. , Disp: , Rfl:    atorvastatin (LIPITOR) 20 MG tablet, Take 20 mg by mouth daily. , Disp: , Rfl:    Cholecalciferol 125 MCG (5000 UT) capsule, Take 5,000 Units by mouth every Monday, Wednesday, and Friday., Disp: , Rfl:    Cyanocobalamin (VITAMIN B-12 PO), Take 1 capsule by mouth every Monday, Wednesday, and Friday., Disp: , Rfl:    furosemide (LASIX) 20 MG tablet, Take 20 mg by mouth 2 (two) times daily. , Disp: , Rfl:    glucose blood (ONETOUCH VERIO) test strip, 1 each by Other route 2 (two) times daily. Use as instructed, Disp: , Rfl:    Insulin Pen Needle (BD PEN NEEDLE NANO U/F) 32G X 4 MM MISC, 1 each by Does not apply route daily., Disp: 100 each, Rfl: 0   losartan (COZAAR) 100 MG tablet, Take 100 mg by mouth every morning. , Disp: , Rfl:    metFORMIN (GLUCOPHAGE) 500 MG tablet, Take 1,000 mg by mouth 2 (two) times daily with a meal. , Disp: , Rfl:    SOLIQUA 100-33 UNT-MCG/ML SOPN, Inject 32 Units into the skin daily before breakfast. , Disp: , Rfl: 2   Thiamine HCl (VITAMIN B-1 PO), Take 1 capsule by mouth every Monday, Wednesday, and Friday., Disp: , Rfl:    VITAMIN A PO, Take 1 capsule by mouth every Monday, Wednesday, and Friday., Disp: , Rfl:    glimepiride (AMARYL) 4 MG tablet, Take 1 tablet (4 mg total) by mouth 2 (two) times a day for 30 days., Disp: 60 tablet, Rfl: 0  PAST MEDICAL HISTORY: Past Medical  History:  Diagnosis Date   Arthritis    Diabetes mellitus without complication ([place])    Edema    in left leg in 1980s on left ankle    Hyperlipidemia    Hypertension    PCOS (polycystic ovarian syndrome)    Superficial thrombophlebitis    Swelling    Venous insufficiency    Vitamin D deficiency     PAST SURGICAL HISTORY: Past Surgical History:  Procedure Laterality Date   BREAST BIOPSY Right    JOINT REPLACEMENT  2011    right hip    left ankle surgery      TOTAL HIP ARTHROPLASTY Left 12/02/2015   Procedure: LEFT TOTAL HIP ARTHROPLASTY ANTERIOR APPROACH;  Surgeon: [name], MD;  Location: [facility];  Service:  Orthopedics;  Laterality: Left;    FAMILY HISTORY: Family History  Problem Relation Age of Onset   Diabetes Mother    Hypertension Mother     SOCIAL HISTORY:  Social History   Socioeconomic History   Marital status: Single    Spouse name: Not on file   Number of children: Not on file   Years of education: Not on file   Highest education level: Not on file  Occupational History   Occupation: Therapist, art  Social Needs   Financial resource strain: Not on file   Food insecurity    Worry: Not on file    Inability: Not on file   Transportation needs    Medical: Not on file    Non-medical: Not on file  Tobacco Use   Smoking status: Former Smoker    Quit date: 10/01/2002    Years since quitting: 16.5   Smokeless tobacco: Never Used  Substance and Sexual Activity   Alcohol use: No   Drug use: No   Sexual activity: Not on file  Lifestyle   Physical activity    Days per week: Not on file    Minutes per session: Not on file   Stress: Not on file  Relationships   Social connections    Talks on phone: Not on file    Gets together: Not on file    Attends religious service: Not on file    Active member of club or organization: Not on file    Attends meetings of clubs or organizations: Not on file    Relationship  status: Not on file   Intimate partner violence    Fear of current or ex partner: Not on file    Emotionally abused: Not on file    Physically abused: Not on file    Forced sexual activity: Not on file  Other Topics Concern   Not on file  Social History Narrative   Lives with son who is 22 (03/29/19)   Caffeine use: Hot tea daily   Right handed      PHYSICAL EXAM  Vitals:   03/29/19 1024  BP: (!) 145/78  Pulse: (!) 104  Temp: (!) 96.2 F (35.7 C)  SpO2: 98%  Weight: 297 lb (134.7 kg)  Height: 5' 7"  (1.702 m)    Body mass index is 46.52 kg/m.   General: The patient is well-developed and well-nourished and in no acute distress  HEENT:  Head is [place]/AT.  Sclera are anicteric.    Neck: No carotid bruits are noted.  The neck is nontender.  Cardiovascular: The heart has a regular rate and rhythm with a normal S1 and S2. There were no murmurs, gallops or rubs.    Skin: Extremities are without rash or  edema.  Musculoskeletal:  Back is nontender  Neurologic Exam  Mental status: The patient is alert and oriented x 3 at the time of the examination. The patient has apparent normal recent and remote memory, with an apparently normal attention span and concentration ability.   Speech is normal.  Cranial nerves: Extraocular movements are full. Pupils are equal, round, and reactive to light and accomodation.   Color vision is symmetric.  Facial symmetry is present. There is good facial sensation to soft touch bilaterally.Facial strength is normal.  Trapezius and sternocleidomastoid strength is normal. No dysarthria is noted.  The tongue is midline, and the patient has symmetric elevation of the soft palate. No obvious hearing deficits are noted.  Motor:  Muscle  bulk is normal.   Tone is normal. Strength is  5 / 5 in all 4 extremities.   Sensory: Sensory testing is intact to pinprick, soft touch and vibration sensation in the arms and right leg but reduced sensation to touch/vib in  the left 5th toe.  Reduced perineal/sacral sensation (S2 dermatome to S5)  Coordination: Cerebellar testing reveals good finger-nose-finger and heel-to-shin bilaterally.  Gait and station: Station is normal.   Gait is normal. Tandem gait is normal. Romberg is negative.   Reflexes: Deep tendon reflexes are symmetric and normal bilaterally.   Plantar responses are flexor.    DIAGNOSTIC DATA (LABS, IMAGING, TESTING) - I reviewed patient records, labs, notes, testing and imaging myself where available.  Lab Results  Component Value Date   WBC 7.9 02/15/2019   HGB 12.7 02/15/2019   HCT 39.6 02/15/2019   MCV 83.7 02/15/2019   PLT 180 02/15/2019      Component Value Date/Time   NA 139 02/15/2019 0454   NA 141 05/04/2017 0818   K 3.7 02/15/2019 0454   CL 105 02/15/2019 0454   CO2 24 02/15/2019 0454   GLUCOSE 114 (H) 02/15/2019 0454   BUN 11 02/15/2019 0454   BUN 11 05/04/2017 0818   CREATININE 0.54 02/15/2019 0454   CALCIUM 9.5 02/15/2019 0454   PROT 6.2 (L) 02/14/2019 0809   PROT 6.9 05/04/2017 0818   ALBUMIN 3.1 (L) 02/14/2019 0809   ALBUMIN 4.1 05/04/2017 0818   AST 42 (H) 02/14/2019 0809   ALT 26 02/14/2019 0809   ALKPHOS 57 02/14/2019 0809   BILITOT 1.2 02/14/2019 0809   BILITOT 0.2 05/04/2017 0818   GFRNONAA >60 02/15/2019 0454   GFRAA >60 02/15/2019 0454   Lab Results  Component Value Date   CHOL 163 02/18/2019   HDL 60 02/18/2019   LDLCALC 81 02/18/2019   TRIG 110 02/18/2019   CHOLHDL 2.7 02/18/2019   Lab Results  Component Value Date   HGBA1C 7.5 (H) 02/14/2019   Lab Results  Component Value Date   [id number] 676 12/31/2016   Lab Results  Component Value Date   TSH 1.682 02/14/2019       ASSESSMENT AND PLAN    1. Spinal cord infarction ([place])   2. Dysesthesia   3. Gait disturbance   4. Type 2 diabetes mellitus without complication, without long-term current use of insulin ([place])     In summary, Ms. Crock is a 63 year old woman who had  a spinal cord event on 02/10/2019.  I believe the most likely cause is a spinal cord infarction as there was no enhancement on the postcontrast images.  However, the differential diagnosis also includes transverse myelitis caused by vasculitis, post viral transverse myelitis.  MS is less likely given the appearance of the lesion (central) and the absence of enhancement and oligoclonal bands.  She is improving.  Hopefully she will continue to improve and be able to walk without support.  The numbness is also improving though it is probable that she will have some residual symptoms.  Bladder function has improved.  She also has a 7-year history of type 2 diabetes mellitus.  She does not appear to have any significant polyneuropathy associated with the diabetes.  We will check some blood work to rule out vasculitis.  She is advised to take a baby aspirin daily.  Because of the possibility that this could be a transverse myelitis associated with MS, we will recheck an MRI of the brain early next year  to determine if there is subclinical progression consistent with MS.  She will return to see me in 6 to 7 months or sooner if there are new or worsening neurologic symptoms.  Thank you for asking me to see Ms. Marvel Plan.  Please let me know if I can be of further assistance with her or other patients in the future.     Bobbie Valletta A. Felecia Shelling, MD, [facility] 9/62/2297, 9:89 PM Certified in Neurology, Clinical Neurophysiology, Sleep Medicine and Neuroimaging  [facility] [address] [place], [place] [address] [phone]

## 2019-03-30 LAB — SEDIMENTATION RATE: Sed Rate: 24 mm/hr (ref 0–40)

## 2019-03-30 LAB — C-REACTIVE PROTEIN: CRP: 7 mg/L (ref 0–10)

## 2019-03-30 LAB — SPECIMEN STATUS REPORT

## 2019-03-30 LAB — ANA W/REFLEX: Anti Nuclear Antibody (ANA): NEGATIVE

## 2019-04-03 ENCOUNTER — Telehealth: Payer: Self-pay | Admitting: *Deleted

## 2019-04-03 NOTE — Telephone Encounter (Signed)
-----   Message from Britt Bottom, MD sent at 04/03/2019  7:38 AM EDT ----- Please let the patient know that the lab work is fine.

## 2019-04-05 ENCOUNTER — Other Ambulatory Visit: Payer: Self-pay | Admitting: Cardiology

## 2019-04-05 DIAGNOSIS — R55 Syncope and collapse: Secondary | ICD-10-CM

## 2019-04-05 DIAGNOSIS — I4891 Unspecified atrial fibrillation: Secondary | ICD-10-CM

## 2019-04-05 DIAGNOSIS — I639 Cerebral infarction, unspecified: Secondary | ICD-10-CM

## 2019-04-11 ENCOUNTER — Telehealth: Payer: Self-pay | Admitting: Interventional Cardiology

## 2019-04-11 NOTE — Telephone Encounter (Signed)
  Patient wants her daughter to come with her to her visit with Dr Tamala Julian on 04/14/19. She also wants to make sure that they will go over her event monitor results

## 2019-04-11 NOTE — Telephone Encounter (Signed)
Spoke with pt and made her aware that daughter would not be able to come up due to office policy regarding COVID.  Advised monitor results will be available for visit on Friday as well.  Pt appreciative for call.

## 2019-04-13 NOTE — Progress Notes (Signed)
Cardiology Office Note:    Date:  04/14/2019   ID:  Laura Blackwell, DOB Mar 25, 1956, MRN [id number]  PCP:  Carol Ada, MD  Cardiologist:  No primary care provider on file.   Referring MD: Carol Ada, MD   Chief Complaint  Patient presents with  . Advice Only    Spinal cord ischemia  . Bradycardia    History of Present Illness:    Laura Blackwell is a 63 y.o. female with a hx of abnormal Holter and recent "spinal stroke". Referred for consultation concerning stroke and arrhythmia.  She was admitted to the hospital in June 2020 after developing acute lower extremity weakness.  She was found to have a spinal cord inflammatory/ischemic syndrome.  No etiology has been identified.  She has had a 30-day monitor that demonstrated transient first and second-degree AV block while asleep.  Otherwise the monitor was totally normal without evidence of atrial arrhythmia.  She has no cardiac complaints.  She denies chest discomfort.  She does have dyspnea on exertion which she feels is related to deconditioning.  Her heart rate is fast today but she professes that this is because she is extremely nervous having to see a cardiologist.  Past Medical History:  Diagnosis Date  . Arthritis   . Diabetes mellitus without complication ([place])   . Edema    in left leg in 1980s on left ankle   . Hyperlipidemia   . Hypertension   . PCOS (polycystic ovarian syndrome)   . Superficial thrombophlebitis   . Swelling   . Venous insufficiency   . Vitamin D deficiency     Past Surgical History:  Procedure Laterality Date  . BREAST BIOPSY Right   . JOINT REPLACEMENT  2011    right hip   . left ankle surgery     . TOTAL HIP ARTHROPLASTY Left 12/02/2015   Procedure: LEFT TOTAL HIP ARTHROPLASTY ANTERIOR APPROACH;  Surgeon: [name], MD;  Location: [facility];  Service: Orthopedics;  Laterality: Left;    Current Medications: Current Meds  Medication Sig  . amLODipine (NORVASC) 5 MG tablet  Take 5 mg by mouth daily.   Marland Kitchen aspirin EC 81 MG tablet Take 81 mg by mouth daily.  Marland Kitchen atorvastatin (LIPITOR) 20 MG tablet Take 20 mg by mouth daily.   . Cholecalciferol 125 MCG (5000 UT) capsule Take 5,000 Units by mouth every Monday, Wednesday, and Friday.  . Cyanocobalamin (VITAMIN B-12 PO) Take 1 capsule by mouth every Monday, Wednesday, and Friday.  . furosemide (LASIX) 20 MG tablet Take 20 mg by mouth 2 (two) times daily.   Marland Kitchen glimepiride (AMARYL) 4 MG tablet Take 1 tablet (4 mg total) by mouth 2 (two) times a day for 30 days.  Marland Kitchen glucose blood (ONETOUCH VERIO) test strip 1 each by Other route 2 (two) times daily. Use as instructed  . Insulin Pen Needle (BD PEN NEEDLE NANO U/F) 32G X 4 MM MISC 1 each by Does not apply route daily.  Marland Kitchen losartan (COZAAR) 100 MG tablet Take 100 mg by mouth every morning.   . metFORMIN (GLUCOPHAGE) 500 MG tablet Take 1,000 mg by mouth 2 (two) times daily with a meal.   . SOLIQUA 100-33 UNT-MCG/ML SOPN Inject 32 Units into the skin daily before breakfast.   . Thiamine HCl (VITAMIN B-1 PO) Take 1 capsule by mouth every Monday, Wednesday, and Friday.  Marland Kitchen VITAMIN A PO Take 1 capsule by mouth every Monday, Wednesday, and Friday.  Allergies:   Patient has no known allergies.   Social History   Socioeconomic History  . Marital status: Single    Spouse name: Not on file  . Number of children: Not on file  . Years of education: Not on file  . Highest education level: Not on file  Occupational History  . Occupation: Therapist, art  Social Needs  . Financial resource strain: Not on file  . Food insecurity    Worry: Not on file    Inability: Not on file  . Transportation needs    Medical: Not on file    Non-medical: Not on file  Tobacco Use  . Smoking status: Former Smoker    Quit date: 10/01/2002    Years since quitting: 16.5  . Smokeless tobacco: Never Used  Substance and Sexual Activity  . Alcohol use: No  . Drug use: No  . Sexual activity:  Not on file  Lifestyle  . Physical activity    Days per week: Not on file    Minutes per session: Not on file  . Stress: Not on file  Relationships  . Social Herbalist on phone: Not on file    Gets together: Not on file    Attends religious service: Not on file    Active member of club or organization: Not on file    Attends meetings of clubs or organizations: Not on file    Relationship status: Not on file  Other Topics Concern  . Not on file  Social History Narrative   Lives with son who is 41 (03/29/19)   Caffeine use: Hot tea daily   Right handed      Family History: The patient's family history includes Diabetes in her mother; Hypertension in her mother.  ROS:   Please see the history of present illness.    Son complains that she snores loudly.  All other systems reviewed and are negative.  EKGs/Labs/Other Studies Reviewed:    The following studies were reviewed today: 2D Doppler echocardiogram February 14, 2019: IMPRESSIONS    1. The left ventricle has normal systolic function with an ejection fraction of 60-65%. The cavity size was normal. There is mildly increased left ventricular wall thickness. Left ventricular diastolic Doppler parameters are consistent with impaired  relaxation.  2. The right ventricle has normal systolic function. The cavity was normal.  3. The mitral valve is grossly normal.  4. The tricuspid valve is grossly normal.  5. The aortic valve is tricuspid. No stenosis of the aortic valve.  6. Normal LV systolic function; mild diastolic dysfunction; mild LVH.  30-day continuous monitor completed 04/05/2019: Study Highlights   NSR with 1 degree AV block  Occasional 2nd degree AVB during night (? sleep).  No obvious AF      EKG:  EKG sinus tachycardia at 107 bpm with PR interval 164 ms.  When compared to the tracing from February 14, 2019, the heart rate is currently faster.  Recent Labs: 02/14/2019: ALT 26; Magnesium 2.1; TSH 1.682  02/15/2019: BUN 11; Creatinine, Ser 0.54; Hemoglobin 12.7; Platelets 180; Potassium 3.7; Sodium 139  Recent Lipid Panel    Component Value Date/Time   CHOL 163 02/18/2019 1128   CHOL 148 12/31/2016 1032   TRIG 110 02/18/2019 1128   HDL 60 02/18/2019 1128   HDL 58 12/31/2016 1032   CHOLHDL 2.7 02/18/2019 1128   VLDL 22 02/18/2019 1128   LDLCALC 81 02/18/2019 1128   LDLCALC 78 12/31/2016 1032  Physical Exam:    VS:  BP 128/68   Pulse (!) 118   Ht 5\' 7"  (1.702 m)   Wt 289 lb (131.1 kg)   SpO2 97%   BMI 45.26 kg/m     Wt Readings from Last 3 Encounters:  04/14/19 289 lb (131.1 kg)  03/29/19 297 lb (134.7 kg)  02/14/19 (!) 300 lb 14.9 oz (136.5 kg)     GEN: Moderate obesity.  Clammy skin, she says because of anxiety.. No acute distress HEENT: Normal NECK: No JVD. LYMPHATICS: No lymphadenopathy CARDIAC:  RRR without murmur, gallop, or edema. VASCULAR:  Normal Pulses. No bruits. RESPIRATORY:  Clear to auscultation without rales, wheezing or rhonchi  ABDOMEN: Soft, non-tender, non-distended, No pulsatile mass, MUSCULOSKELETAL: No deformity  SKIN: Warm and dry NEUROLOGIC:  Alert and oriented x 3 PSYCHIATRIC:  Normal affect   ASSESSMENT:    1. Cerebrovascular accident (CVA), unspecified mechanism ([place])   2. History of abnormal Holter exam   3. First degree heart block   4. Educated About Covid-19 Virus Infection    PLAN:    In order of problems listed above:  1. "Spinal stroke" without obvious cardiac etiology.  My presumption is that appropriate imaging was done in the hospital to exclude atherosclerosis of the of the spinal arteries and aorta.  Atrial fibrillation or other cardioembolic potential explanations have been excluded.  Echocardiogram shows normal LV function. 2. First and second-degree AV block occurring nocturnally usually represents the possibility of obstructive sleep apnea.  In confirming that she snores significantly, I believe a sleep study should be  performed. 3. First and second-degree AV block likely related to sleep apnea.  No further work-up is needed.  EKG today demonstrates normal AV conduction and actual sinus tachycardia due to anxiety.  Clinical follow-up as needed.   Medication Adjustments/Labs and Tests Ordered: Current medicines are reviewed at length with the patient today.  Concerns regarding medicines are outlined above.  No orders of the defined types were placed in this encounter.  No orders of the defined types were placed in this encounter.   There are no Patient Instructions on file for this visit.   Signed, Sinclair Grooms, MD  04/14/2019 4:45 PM    [facility]

## 2019-04-14 ENCOUNTER — Ambulatory Visit (INDEPENDENT_AMBULATORY_CARE_PROVIDER_SITE_OTHER): Payer: 59 | Admitting: Interventional Cardiology

## 2019-04-14 ENCOUNTER — Encounter: Payer: Self-pay | Admitting: Interventional Cardiology

## 2019-04-14 ENCOUNTER — Other Ambulatory Visit: Payer: Self-pay

## 2019-04-14 VITALS — BP 128/68 | HR 118 | Ht 67.0 in | Wt 289.0 lb

## 2019-04-14 DIAGNOSIS — Z9289 Personal history of other medical treatment: Secondary | ICD-10-CM

## 2019-04-14 DIAGNOSIS — I639 Cerebral infarction, unspecified: Secondary | ICD-10-CM

## 2019-04-14 DIAGNOSIS — I440 Atrioventricular block, first degree: Secondary | ICD-10-CM | POA: Diagnosis not present

## 2019-04-14 DIAGNOSIS — Z7189 Other specified counseling: Secondary | ICD-10-CM

## 2019-04-14 NOTE — Patient Instructions (Signed)
Medication Instructions:  Your physician recommends that you continue on your current medications as directed. Please refer to the Current Medication list given to you today.  If you need a refill on your cardiac medications before your next appointment, please call your pharmacy.   Lab work: None If you have labs (blood work) drawn today and your tests are completely normal, you will receive your results only by: Marland Kitchen MyChart Message (if you have MyChart) OR . A paper copy in the mail If you have any lab test that is abnormal or we need to change your treatment, we will call you to review the results.  Testing/Procedures: None  Follow-Up: Your physician recommends that you schedule a follow-up appointment as needed with Dr. Tamala Julian.   Any Other Special Instructions Will Be Listed Below (If Applicable).  Please talk to your Primary Care Physician about a sleep study.

## 2019-04-26 ENCOUNTER — Ambulatory Visit: Payer: 59 | Attending: Family Medicine | Admitting: Physical Therapy

## 2019-04-26 ENCOUNTER — Other Ambulatory Visit: Payer: Self-pay

## 2019-04-26 ENCOUNTER — Encounter: Payer: Self-pay | Admitting: Physical Therapy

## 2019-04-26 VITALS — BP 140/93 | HR 91

## 2019-04-26 DIAGNOSIS — R208 Other disturbances of skin sensation: Secondary | ICD-10-CM | POA: Insufficient documentation

## 2019-04-26 DIAGNOSIS — M6281 Muscle weakness (generalized): Secondary | ICD-10-CM | POA: Diagnosis present

## 2019-04-26 DIAGNOSIS — R29818 Other symptoms and signs involving the nervous system: Secondary | ICD-10-CM | POA: Diagnosis present

## 2019-04-26 DIAGNOSIS — R2681 Unsteadiness on feet: Secondary | ICD-10-CM | POA: Insufficient documentation

## 2019-04-26 DIAGNOSIS — Z9181 History of falling: Secondary | ICD-10-CM | POA: Insufficient documentation

## 2019-04-26 DIAGNOSIS — R262 Difficulty in walking, not elsewhere classified: Secondary | ICD-10-CM | POA: Insufficient documentation

## 2019-04-26 NOTE — Therapy (Signed)
[facility] [address] [place], [place], [address] Phone: [phone]   Fax:  [phone]  Physical Therapy Evaluation  Patient Details  Name: Laura Blackwell MRN: [id number] Date of Birth: 07/05/1956 Referring Provider (PT): Carol Ada, MD   Encounter Date: 04/26/2019  PT End of Session - 04/26/19 0937    Visit Number  1    Number of Visits  17    Date for PT Re-Evaluation  06/25/19    Authorization Type  [employer]    Authorization - Visit Number  2    Authorization - Number of Visits  60    PT Start Time  0845    PT Stop Time  0930    PT Time Calculation (min)  45 min    Equipment Utilized During Treatment  Gait belt    Activity Tolerance  Patient tolerated treatment well;Patient limited by fatigue    Behavior During Therapy  [facility] for tasks assessed/performed       Past Medical History:  Diagnosis Date  . Arthritis   . Diabetes mellitus without complication ([place])   . Edema    in left leg in 1980s on left ankle   . Hyperlipidemia   . Hypertension   . PCOS (polycystic ovarian syndrome)   . Superficial thrombophlebitis   . Swelling   . Venous insufficiency   . Vitamin D deficiency     Past Surgical History:  Procedure Laterality Date  . BREAST BIOPSY Right   . JOINT REPLACEMENT  2011    right hip   . left ankle surgery     . TOTAL HIP ARTHROPLASTY Left 12/02/2015   Procedure: LEFT TOTAL HIP ARTHROPLASTY ANTERIOR APPROACH;  Surgeon: [name], MD;  Location: [facility];  Service: Orthopedics;  Laterality: Left;    Vitals:   04/26/19 0849  BP: (!) 140/93  Pulse: 91     Subjective Assessment - 04/26/19 0852    Subjective  She was admitted to the hospital in June 2020 after developing acute lower extremity weakness.  She was found to have a spinal cord inflammatory/ischemic syndrome. Initially pt could not walk - started walking a cane the first week of August.L side is patient's weaker side. Has  been doing home health for the past 2 months. Can go up and down steep 14 steps one time each day (needs son to be there), needs to use a handrail. Needs to strengthen legs. Sitting on her bottom feels very puffy, legs are freezing from nerve damage. Lots of tingling sensations and warm sensations on both legs L>R, anything touching is irritating (like clothing and blankets).    Pertinent History  spinal cord CVA 01/2019, diabetes mellitus, HTN, PCOS, vitamin D deficiency, sp right (2011) and left (2017) hip replacement, peripheral edema, venous insufficiency.    How long can you walk comfortably?  only household distances, states could not walk around track in therapy gym with cane    Diagnostic tests  MRI showed an abnormal focus in the conus medullaris and lower spinal cord adjacent to T12-L1.    Patient Stated Goals  "wants to be 99.99% better, wants to be the best she can be" - needs to strengthen thigh area    Currently in Pain?  No/denies         [facility] PT Assessment - 04/26/19 0902      Assessment   Medical Diagnosis  Spinal Cord Stroke    Referring Provider (PT)  Carol Ada, MD  Onset Date/Surgical Date  02/10/19    Hand Dominance  Right    Prior Therapy  previous HHPT from [facility]      Precautions   Precautions  Fall      Balance Screen   Has the patient fallen in the past 6 months  Yes    How many times?  2   one fall before hospital, another picking up water bottle   Has the patient had a decrease in activity level because of a fear of falling?   Yes    Is the patient reluctant to leave their home because of a fear of falling?   Yes      [place]  Private residence    Living Arrangements  Children   son, there all the time - does cooking, cleaning    Type of [place] to enter    Entrance Stairs-Number of Steps  5    Entrance Stairs-Rails  Can reach both    [place]  Two level    Alternate Level  Stairs-Number of Steps  14    Alternate Level Stairs-Rails  Left;Right   half of rail on R, closer to top no rail on Walker - standard;Cane - single point      Prior Function   Level of Independence  Independent    Vocation  Full time employment   previous, now under [employer]  works at [employer], works every other weekend at [employer] at [employer]  reading, listening to music, gardening      Observation/Other Assessments   Observations  LLE edema - per pt report this is her normal prior to stroke       Sensation   Light Touch  Impaired by gross assessment;Impaired Detail    Light Touch Impaired Details  Impaired LLE    Hot/Cold  Appears Intact   per pt report    Additional Comments  could feel on LLE, not as strong of a feeling on LLE      Coordination   Gross Motor Movements are Fluid and Coordinated  No    Coordination and Movement Description  heel to shin limited B due to strength deficits      ROM / Strength   AROM / PROM / Strength  Strength;AROM      AROM   Overall AROM Comments  limited L DF and B hip flexion ROM due to strength deficits      Strength   Overall Strength  Deficits    Strength Assessment Site  Hip;Knee;Ankle    Right/Left Hip  Right;Left    Right Hip Flexion  3+/5    Left Hip Flexion  3+/5    Right/Left Knee  Right;Left    Right Knee Flexion  4+/5    Right Knee Extension  5/5    Left Knee Flexion  4+/5    Left Knee Extension  5/5    Right/Left Ankle  Right;Left    Right Ankle Dorsiflexion  3+/5    Left Ankle Dorsiflexion  5/5      Transfers   Transfers  Sit to Stand;Stand to Sit    Sit to Stand  5: Supervision    Sit to Stand Details (indicate cue type and reason)  does not use cane, can perform with no BUE support  Five time sit to stand comments   12.84 seconds no UE support from chair     Stand to Sit  5: Supervision      Ambulation/Gait   Ambulation/Gait  Yes     Ambulation/Gait Assistance  4: Min guard    Ambulation Distance (Feet)  --   minimal clinic distances    Assistive device  Straight cane    Gait Pattern  Step-to pattern;Decreased stance time - left;Decreased step length - right;Decreased arm swing - left;Decreased weight shift to left;Decreased hip/knee flexion - right;Decreased hip/knee flexion - left;Antalgic;Decreased trunk rotation;Trunk flexed;Wide base of support    Ambulation Surface  Level;Indoor      Standardized Balance Assessment   Standardized Balance Assessment  Timed Up and Go Test      Timed Up and Go Test   Normal TUG (seconds)  20.35   high fall risk, using cane               Objective measurements completed on examination: See above findings.              PT Education - 04/26/19 [phone]    Education Details  clinical findings, POC, fall risk based on TUG score    Person(s) Educated  Patient    Methods  Explanation    Comprehension  Verbalized understanding       PT Short Term Goals - 04/26/19 0953      PT SHORT TERM GOAL #1   Title  Patient will be independent with initial HEP with focus on strength and balance in order to build upon functional gains in therapy. ALL STGs DUE 05/24/19.    Time  4    Period  Weeks    Status  New    Target Date  05/24/19      PT SHORT TERM GOAL #2   Title  Patient will improve BERG score by at least 3 points to demonstrate decreased fall risk.    Baseline  not yet assessed      PT SHORT TERM GOAL #3   Title  Patient will perform 4 steps with both handrails/single railing with step to pattern and supervision in order to safely enter/exit home.    Baseline  not yet assessed      PT SHORT TERM GOAL #4   Title  Patient will ambulate at least 52' with supervision using cane with step through gait pattern in order to improve house hold mobility.      PT SHORT TERM GOAL #5   Title  Patient will decrease TUG score using LRAD to at least 18 seconds in order to  decrease risk of falls.    Baseline  20.35 seconds on 04/26/19      Additional Short Term Goals   Additional Short Term Goals  Yes      PT SHORT TERM GOAL #6   Title  Patient will undergo further testing of endurance with 3 vs. 6MWT. Goal will be written as appropriate.        PT Long Term Goals - 04/26/19 [id number]      PT LONG TERM GOAL #1   Title  Patient will be independent with final HEP with focus on strength and balance in order to build upon functional gains in therapy. ALL LTGs DUE 06/25/19.    Time  8    Period  Weeks    Status  New    Target Date  06/21/19      PT  LONG TERM GOAL #2   Title  Patient will improve BERG score by at least 6 points to demonstrate decreased fall risk.      PT LONG TERM GOAL #3   Title  Patient will decrease TUG score using LRAD to at least 15 seconds in order to decrease risk of falls.      PT LONG TERM GOAL #4   Title  Patient will perform 14 steps with single railing with step to pattern and supervision in order to safely ascend/descend stairs in her home.    Baseline  not yet assessed.      PT LONG TERM GOAL #5   Title  Patient will ambulate at least 350' with supervision over indoor level/outdoor unlevel surfaces using cane with step through gait pattern in order to improve community mobility.      Additional Long Term Goals   Additional Long Term Goals  Yes      PT LONG TERM GOAL #6   Title  6MWT or 3MWT goal when appropriate.             Plan - 04/26/19 0945    Clinical Impression Statement  Patient is a 63 year old female referred to Neuro OPPT for evaluation s/p Spinal Cord CVA from June 2020. Pt has been receiving HHPT services for the past couple of months. Pt's PMH is significant for: diabetes mellitus, HTN, PCOS, vitamin D deficiency, sp right (2011) and left (2017) hip replacement, peripheral edema, venous insufficiency.  The following deficits were present during the exam: decreased endurance, gait abnormalities (antalgic  gait, decreased stance time on LLE), impaired balance, impaired sensation, decreased LE strength. Pt's TUG scores indicate pt is at a high risk for falls. Will further assess BERG/gait speed at future visits - did not address today d/t time constraints. Pt would benefit from skilled PT to address these impairments and functional limitations to maximize functional mobility independence.    Personal Factors and Comorbidities  Past/Current Experience;Comorbidity 3+    Comorbidities  spinal cord CVA 01/2019, diabetes mellitus, HTN, PCOS, vitamin D deficiency, sp right (2011) and left (2017) hip replacement, peripheral edema, venous insufficiency.    Examination-Activity Limitations  Sit;Squat;Stairs;Stand;Transfers;Locomotion Level    Examination-Participation Restrictions  Meal Prep;Driving;Community Activity    Stability/Clinical Decision Making  Evolving/Moderate complexity    Clinical Decision Making  Moderate    Rehab Potential  Good    PT Frequency  2x / week    PT Duration  8 weeks    PT Treatment/Interventions  ADLs/Self Care Home Management;DME Instruction;Gait training;Stair training;Functional mobility training;Therapeutic activities;Therapeutic exercise;Balance training;Patient/family education;Neuromuscular re-education;Energy conservation    PT Next Visit Plan  assess BERG, gait speed (as able) - 3MWT when able (to assess walking endurance), initial HEP for LE strengthing/ROM, balance. check BP, education about warning signs/sx of a stroke, monitoring BP, fall prevention in the home    Consulted and Agree with Plan of Care  Patient       Patient will benefit from skilled therapeutic intervention in order to improve the following deficits and impairments:  Abnormal gait, Decreased activity tolerance, Decreased balance, Decreased endurance, Decreased coordination, Decreased mobility, Decreased range of motion, Difficulty walking, Decreased strength, Impaired sensation  Visit  Diagnosis: Difficulty in walking, not elsewhere classified  Other symptoms and signs involving the nervous system  Unsteadiness on feet  Muscle weakness (generalized)  History of falling  Other disturbances of skin sensation     Problem List Patient Active Problem List  Diagnosis Date Noted  . Dysesthesia 03/29/2019  . Gait disturbance 03/29/2019  . Spinal cord infarction ([place]) 02/20/2019  . Cauda equina syndrome ([place]) 02/13/2019  . DM2 (diabetes mellitus, type 2) ([place]) 02/13/2019  . Syncope 02/13/2019  . Essential hypertension 02/13/2019  . Class 3 severe obesity with serious comorbidity and body mass index (BMI) of 45.0 to 49.9 in adult ([place]) 10/04/2017  . Class 3 severe obesity without serious comorbidity with body mass index (BMI) of 45.0 to 49.9 in adult ([place]) 04/12/2017  . Vitamin D deficiency 04/12/2017  . Other fatigue 12/31/2016  . SOB (shortness of breath) on exertion 12/31/2016  . Type 2 diabetes mellitus without complication, without long-term current use of insulin ([place]) 12/31/2016  . Morbid obesity ([place]) 12/31/2016  . OA (osteoarthritis) of hip 12/02/2015    Arliss Journey, PT, DPT 04/26/2019, 10:05 AM  [facility] [address] [place], [place], [address] Phone: [phone]   Fax:  [phone]  Name: Laura Blackwell MRN: [id number] Date of Birth: September 19, 1955

## 2019-05-01 ENCOUNTER — Ambulatory Visit: Payer: 59 | Admitting: Physical Therapy

## 2019-05-01 ENCOUNTER — Other Ambulatory Visit: Payer: Self-pay

## 2019-05-01 ENCOUNTER — Encounter: Payer: Self-pay | Admitting: Physical Therapy

## 2019-05-01 VITALS — BP 136/88 | HR 105

## 2019-05-01 DIAGNOSIS — R262 Difficulty in walking, not elsewhere classified: Secondary | ICD-10-CM

## 2019-05-01 DIAGNOSIS — R208 Other disturbances of skin sensation: Secondary | ICD-10-CM

## 2019-05-01 DIAGNOSIS — M6281 Muscle weakness (generalized): Secondary | ICD-10-CM

## 2019-05-01 DIAGNOSIS — R29818 Other symptoms and signs involving the nervous system: Secondary | ICD-10-CM

## 2019-05-01 DIAGNOSIS — R2681 Unsteadiness on feet: Secondary | ICD-10-CM

## 2019-05-01 DIAGNOSIS — Z9181 History of falling: Secondary | ICD-10-CM

## 2019-05-01 NOTE — Patient Instructions (Signed)
Access Code: [email or web address]  URL: [email or web address]  Date: 05/01/2019  Prepared by: Janann August   Exercises Heel rises with counter support - 10 reps - 2 sets - 1x daily - 7x weekly Standing Hip Abduction with Counter Support - 5 reps - 3 sets - 2x daily - 7x weekly Standing Marching - 5 reps - 3 sets - 2x daily - 7x weekly Standing Knee Flexion AROM with Chair Support - 5 reps - 3 sets - 2x daily - 7x weekly

## 2019-05-01 NOTE — Therapy (Signed)
[facility] [address] [place], [place], [address] Phone: [phone]   Fax:  [phone]  Physical Therapy Treatment  Patient Details  Name: Laura Blackwell MRN: [id number] Date of Birth: Jun 29, 1956 Referring Provider (PT): Carol Ada, MD   Encounter Date: 05/01/2019  PT End of Session - 05/01/19 1800    Visit Number  2    Number of Visits  17    Date for PT Re-Evaluation  06/25/19    Authorization Type  [employer]    Authorization - Visit Number  3    Authorization - Number of Visits  60    PT Start Time  [id number]    PT Stop Time  1445    PT Time Calculation (min)  43 min    Equipment Utilized During Treatment  Gait belt    Activity Tolerance  Patient tolerated treatment well    Behavior During Therapy  WFL for tasks assessed/performed       Past Medical History:  Diagnosis Date  . Arthritis   . Diabetes mellitus without complication ([place])   . Edema    in left leg in 1980s on left ankle   . Hyperlipidemia   . Hypertension   . PCOS (polycystic ovarian syndrome)   . Superficial thrombophlebitis   . Swelling   . Venous insufficiency   . Vitamin D deficiency     Past Surgical History:  Procedure Laterality Date  . BREAST BIOPSY Right   . JOINT REPLACEMENT  2011    right hip   . left ankle surgery     . TOTAL HIP ARTHROPLASTY Left 12/02/2015   Procedure: LEFT TOTAL HIP ARTHROPLASTY ANTERIOR APPROACH;  Surgeon: [name], MD;  Location: [facility];  Service: Orthopedics;  Laterality: Left;    Vitals:   05/01/19 1404  BP: 136/88  Pulse: (!) 105    Subjective Assessment - 05/01/19 1408    Subjective  Went driving over this weekend in an empty parking lot and states that it went well. Was at the gas station and could not step up on the curb with her cane - had to go find an incline.    Pertinent History  spinal cord CVA 01/2019, diabetes mellitus, HTN, PCOS, vitamin D deficiency, sp right (2011) and left  (2017) hip replacement, peripheral edema, venous insufficiency.    How long can you walk comfortably?  only household distances, states could not walk around track in therapy gym with cane    Diagnostic tests  MRI showed an abnormal focus in the conus medullaris and lower spinal cord adjacent to T12-L1.    Patient Stated Goals  "wants to be 99.99% better, wants to be the best she can be" - needs to strengthen thigh area         [facility] PT Assessment - 05/01/19 1411      Ambulation/Gait   Ambulation/Gait  Yes    Ambulation/Gait Assistance  4: Min guard    Ambulation Distance (Feet)  100 Feet    Assistive device  Straight cane    Gait Pattern  Step-to pattern;Decreased step length - right;Decreased arm swing - left;Decreased weight shift to left;Decreased hip/knee flexion - right;Decreased hip/knee flexion - left;Antalgic;Decreased trunk rotation;Trunk flexed;Wide base of support;Step-through pattern;Decreased stance time - right;Trendelenburg    Ambulation Surface  Level;Indoor    Gait velocity  1.41 ft/sec    Gait Comments  needs brief standing rest break after 50 ft before continuing  Standardized Balance Assessment   Standardized Balance Assessment  Berg Balance Test      Berg Balance Test   Sit to Stand  Able to stand without using hands and stabilize independently    Standing Unsupported  Able to stand safely 2 minutes    Sitting with Back Unsupported but Feet Supported on Floor or Stool  Able to sit safely and securely 2 minutes    Stand to Sit  Sits safely with minimal use of hands    Transfers  Able to transfer safely, minor use of hands    Standing Unsupported with Eyes Closed  Able to stand 3 seconds    Standing Unsupported with Feet Together  Able to place feet together independently and stand 1 minute safely    From Standing, Reach Forward with Outstretched Arm  Can reach confidently >25 cm (10")    From Standing Position, Pick up Object from Floor  Able to pick up shoe,  needs supervision    From Standing Position, Turn to Look Behind Over each Shoulder  Looks behind from both sides and weight shifts well    Turn 360 Degrees  Able to turn 360 degrees safely but slowly   7 seconds to R, 5.18 seconds to L   Standing Unsupported, Alternately Place Feet on Step/Stool  Needs assistance to keep from falling or unable to try    Standing Unsupported, One Foot in [employer] balance while stepping or standing    Standing on One Leg  Unable to try or needs assist to prevent fall    Total Score  39    Berg comment:  39/56   significant risk for falls               Access Code: [email or web address]  URL: [email or web address]  Date: 05/01/2019  Prepared by: Janann August   Initiated HEP. Cues for technique.   Exercises Heel rises with counter support - 10 reps - 2 sets - 1x daily - 7x weekly Standing Hip Abduction with Counter Support - 5 reps - 3 sets - 2x daily - 7x weekly Standing Marching - 5 reps - 3 sets - 2x daily - 7x weekly --> stepping forward with heel strike first and weight shifting on anterior LE before stepping back Standing Knee Flexion AROM with Chair Support - 5 reps - 3 sets - 2x daily - 7x weekly             PT Education - 05/01/19 1759    Education Details  initial HEP    Person(s) Educated  Patient    Methods  Explanation;Handout;Demonstration    Comprehension  Verbalized understanding;Returned demonstration;Need further instruction       PT Short Term Goals - 05/01/19 1814      PT SHORT TERM GOAL #1   Title  Patient will be independent with initial HEP with focus on strength and balance in order to build upon functional gains in therapy. ALL STGs DUE 05/24/19.    Time  4    Period  Weeks    Status  New    Target Date  05/24/19      PT SHORT TERM GOAL #2   Title  Patient will improve BERG score by at least 3 points to demonstrate decreased fall risk.    Baseline  39/56 on 05/01/19      PT SHORT TERM GOAL  #3   Title  Patient will perform 4 steps with both handrails/single railing with step  to pattern and supervision in order to safely enter/exit home.    Baseline  not yet assessed      PT SHORT TERM GOAL #4   Title  Patient will ambulate at least 30' with supervision using cane with step through gait pattern in order to improve house hold mobility.      PT SHORT TERM GOAL #5   Title  Patient will decrease TUG score using LRAD to at least 18 seconds in order to decrease risk of falls.    Baseline  20.35 seconds on 04/26/19      PT SHORT TERM GOAL #6   Title  Patient will undergo further testing of endurance with 3 vs. 6MWT. Goal will be written as appropriate.        PT Long Term Goals - 05/01/19 1814      PT LONG TERM GOAL #1   Title  Patient will be independent with final HEP with focus on strength and balance in order to build upon functional gains in therapy. ALL LTGs DUE 06/25/19.    Time  8    Period  Weeks    Status  New      PT LONG TERM GOAL #2   Title  Patient will improve BERG score by at least 7 points to demonstrate decreased fall risk.      PT LONG TERM GOAL #3   Title  Patient will decrease TUG score using LRAD to at least 15 seconds in order to decrease risk of falls.      PT LONG TERM GOAL #4   Title  Patient will perform 14 steps with single railing with step to pattern and supervision in order to safely ascend/descend stairs in her home.    Baseline  not yet assessed.      PT LONG TERM GOAL #5   Title  Patient will ambulate at least 350' with supervision over indoor level/outdoor unlevel surfaces using cane with step through gait pattern in order to improve community mobility.      Additional Long Term Goals   Additional Long Term Goals  Yes      PT LONG TERM GOAL #6   Title  6MWT or 3MWT goal when appropriate.      PT LONG TERM GOAL #7   Title  Patient will improve gait speed to at least 2.2 ft/sec using LRAD in order to decrease risk of falls.     Baseline  1.41 ft/sec            Plan - 05/01/19 1810    Clinical Impression Statement  Assessed pt's BERG and gait speed outcome measures today (wrote goals as appropriate). Based on these scores pt is at a significant risk for falls and recurrent falls. Pt needing brief standing rest break after ambulating approx. 50 ft before continuing. Initiated pt's HEP for home for LE strength - verbal and demonstrative cueing in order for pt to perform with correct technique. Will continue to progress towards LTGs.    Personal Factors and Comorbidities  Past/Current Experience;Comorbidity 3+    Comorbidities  spinal cord CVA 01/2019, diabetes mellitus, HTN, PCOS, vitamin D deficiency, sp right (2011) and left (2017) hip replacement, peripheral edema, venous insufficiency.    Examination-Activity Limitations  Sit;Squat;Stairs;Stand;Transfers;Locomotion Level    Examination-Participation Restrictions  Meal Prep;Driving;Community Activity    Stability/Clinical Decision Making  Evolving/Moderate complexity    Rehab Potential  Good    PT Frequency  2x / week    PT  Duration  8 weeks    PT Treatment/Interventions  ADLs/Self Care Home Management;DME Instruction;Gait training;Stair training;Functional mobility training;Therapeutic activities;Therapeutic exercise;Balance training;Patient/family education;Neuromuscular re-education;Energy conservation    PT Next Visit Plan  3MWT when able (to assess walking endurance), check HR during activity, add on to initial HEP for LE strengthing/ROM, balance, standing static balance on stable surfaces (EC, partial tandem)    Consulted and Agree with Plan of Care  Patient       Patient will benefit from skilled therapeutic intervention in order to improve the following deficits and impairments:  Abnormal gait, Decreased activity tolerance, Decreased balance, Decreased endurance, Decreased coordination, Decreased mobility, Decreased range of motion, Difficulty walking,  Decreased strength, Impaired sensation  Visit Diagnosis: Difficulty in walking, not elsewhere classified  Other symptoms and signs involving the nervous system  Unsteadiness on feet  Muscle weakness (generalized)  History of falling  Other disturbances of skin sensation     Problem List Patient Active Problem List   Diagnosis Date Noted  . Dysesthesia 03/29/2019  . Gait disturbance 03/29/2019  . Spinal cord infarction ([place]) 02/20/2019  . Cauda equina syndrome ([place]) 02/13/2019  . DM2 (diabetes mellitus, type 2) ([place]) 02/13/2019  . Syncope 02/13/2019  . Essential hypertension 02/13/2019  . Class 3 severe obesity with serious comorbidity and body mass index (BMI) of 45.0 to 49.9 in adult ([place]) 10/04/2017  . Class 3 severe obesity without serious comorbidity with body mass index (BMI) of 45.0 to 49.9 in adult ([place]) 04/12/2017  . Vitamin D deficiency 04/12/2017  . Other fatigue 12/31/2016  . SOB (shortness of breath) on exertion 12/31/2016  . Type 2 diabetes mellitus without complication, without long-term current use of insulin ([place]) 12/31/2016  . Morbid obesity ([place]) 12/31/2016  . OA (osteoarthritis) of hip 12/02/2015    Arliss Journey, PT, DPT 05/01/2019, 6:17 PM  [facility] [address] [place], [place], [address] Phone: [phone]   Fax:  [phone]  Name: GABRIELLY BORCHARD MRN: [id number] Date of Birth: Jun 30, 1956

## 2019-05-03 ENCOUNTER — Other Ambulatory Visit: Payer: Self-pay

## 2019-05-03 ENCOUNTER — Ambulatory Visit: Payer: 59 | Attending: Family Medicine | Admitting: Physical Therapy

## 2019-05-03 ENCOUNTER — Encounter: Payer: Self-pay | Admitting: Physical Therapy

## 2019-05-03 DIAGNOSIS — R262 Difficulty in walking, not elsewhere classified: Secondary | ICD-10-CM | POA: Diagnosis present

## 2019-05-03 DIAGNOSIS — R2681 Unsteadiness on feet: Secondary | ICD-10-CM | POA: Insufficient documentation

## 2019-05-03 DIAGNOSIS — Z9181 History of falling: Secondary | ICD-10-CM | POA: Insufficient documentation

## 2019-05-03 DIAGNOSIS — R208 Other disturbances of skin sensation: Secondary | ICD-10-CM | POA: Diagnosis present

## 2019-05-03 DIAGNOSIS — R29818 Other symptoms and signs involving the nervous system: Secondary | ICD-10-CM | POA: Insufficient documentation

## 2019-05-03 DIAGNOSIS — M6281 Muscle weakness (generalized): Secondary | ICD-10-CM | POA: Insufficient documentation

## 2019-05-03 NOTE — Therapy (Signed)
[facility] [address] [place], [place], [address] Phone: [phone]   Fax:  [phone]  Physical Therapy Treatment  Patient Details  Name: Laura Blackwell MRN: [id number] Date of Birth: [birth year] Referring Provider (PT): Carol Ada, MD   Encounter Date: 05/03/2019  PT End of Session - 05/03/19 0853    Visit Number  3    Number of Visits  17    Date for PT Re-Evaluation  06/25/19    Authorization Type  UHC    Authorization - Visit Number  4    Authorization - Number of Visits  60    PT Start Time  [id number]    PT Stop Time  0929    PT Time Calculation (min)  40 min    Equipment Utilized During Treatment  Gait belt    Activity Tolerance  Patient tolerated treatment well    Behavior During Therapy  [facility] for tasks assessed/performed       Past Medical History:  Diagnosis Date  . Arthritis   . Diabetes mellitus without complication ([place])   . Edema    in left leg in 1980s on left ankle   . Hyperlipidemia   . Hypertension   . PCOS (polycystic ovarian syndrome)   . Superficial thrombophlebitis   . Swelling   . Venous insufficiency   . Vitamin D deficiency     Past Surgical History:  Procedure Laterality Date  . BREAST BIOPSY Right   . JOINT REPLACEMENT  2011    right hip   . left ankle surgery     . TOTAL HIP ARTHROPLASTY Left 12/02/2015   Procedure: LEFT TOTAL HIP ARTHROPLASTY ANTERIOR APPROACH;  Surgeon: [name], MD;  Location: [facility];  Service: Orthopedics;  Laterality: Left;    There were no vitals filed for this visit.  Subjective Assessment - 05/03/19 0852    Subjective  No new complaints. No falls or pain to report. Does have back pain that comes and goes, heat helps in the shower.    Pertinent History  spinal cord CVA 01/2019, diabetes mellitus, HTN, PCOS, vitamin D deficiency, sp right (2011) and left (2017) hip replacement, peripheral edema, venous insufficiency.    How long can you walk  comfortably?  only household distances, states could not walk around track in therapy gym with cane    Diagnostic tests  MRI showed an abnormal focus in the conus medullaris and lower spinal cord adjacent to T12-L1.    Patient Stated Goals  "wants to be 99.99% better, wants to be the best she can be" - needs to strengthen thigh area    Currently in Pain?  No/denies             [facility] Adult PT Treatment/Exercise - 05/03/19 0902      Transfers   Transfers  Sit to Stand;Stand to Sit    Sit to Stand  5: Supervision    Stand to Sit  5: Supervision      Ambulation/Gait   Ambulation/Gait  Yes    Ambulation/Gait Assistance  4: Min guard;5: Supervision    Ambulation/Gait Assistance Details  min guard assist provided when pt was fatigued after strengthening ex's for walking back to mat table for safety with no issues noted.     Ambulation Distance (Feet)  60 Feet   x1, 70 x1   Assistive device  Straight cane    Gait Pattern  Step-to pattern;Decreased step length - right;Decreased arm swing -  left;Decreased weight shift to left;Decreased hip/knee flexion - right;Decreased hip/knee flexion - left;Antalgic;Decreased trunk rotation;Trunk flexed;Wide base of support;Step-through pattern;Decreased stance time - right;Trendelenburg    Ambulation Surface  Level;Indoor      Neuro Re-ed    Neuro Re-ed Details   for balance/muscle re-ed: feet hip width apart to feet together for EC no head movements 30 sec's for 4 total reps; feet back to hip width apart- EC head movements left<>Right, then up<>down for 10-12 reps each; modified tandem stance with EC for 30 sec's each x2 reps each side. min guard to min assist for balance, occasional touch to chair back for balacne.      Knee/Hip Exercises: Aerobic   Other Aerobic  Scifit UE/LE level 2.5 for 8 minutes with goal >/= 40 rpm for strengthening and activity tolerance.      Knee/Hip Exercises: Standing   Lateral Step Up  Both;2 sets;5 reps;Hand Hold: 2;Step  Height: 4";Limitations    Lateral Step Up Limitations  cues on form and technique    Forward Step Up  Both;2 sets;5 reps;Hand Hold: 2;Step Height: 4"    Forward Step Up Limitations  cues for form and technique          PT Short Term Goals - 05/01/19 1814      PT SHORT TERM GOAL #1   Title  Patient will be independent with initial HEP with focus on strength and balance in order to build upon functional gains in therapy. ALL STGs DUE 05/24/19.    Time  4    Period  Weeks    Status  New    Target Date  05/24/19      PT SHORT TERM GOAL #2   Title  Patient will improve BERG score by at least 3 points to demonstrate decreased fall risk.    Baseline  39/56 on 05/01/19      PT SHORT TERM GOAL #3   Title  Patient will perform 4 steps with both handrails/single railing with step to pattern and supervision in order to safely enter/exit home.    Baseline  not yet assessed      PT SHORT TERM GOAL #4   Title  Patient will ambulate at least 3' with supervision using cane with step through gait pattern in order to improve house hold mobility.      PT SHORT TERM GOAL #5   Title  Patient will decrease TUG score using LRAD to at least 18 seconds in order to decrease risk of falls.    Baseline  20.35 seconds on 04/26/19      PT SHORT TERM GOAL #6   Title  Patient will undergo further testing of endurance with 3 vs. 6MWT. Goal will be written as appropriate.        PT Long Term Goals - 05/01/19 1814      PT LONG TERM GOAL #1   Title  Patient will be independent with final HEP with focus on strength and balance in order to build upon functional gains in therapy. ALL LTGs DUE 06/25/19.    Time  8    Period  Weeks    Status  New      PT LONG TERM GOAL #2   Title  Patient will improve BERG score by at least 7 points to demonstrate decreased fall risk.      PT LONG TERM GOAL #3   Title  Patient will decrease TUG score using LRAD to at least 15 seconds in  order to decrease risk of falls.       PT LONG TERM GOAL #4   Title  Patient will perform 14 steps with single railing with step to pattern and supervision in order to safely ascend/descend stairs in her home.    Baseline  not yet assessed.      PT LONG TERM GOAL #5   Title  Patient will ambulate at least 350' with supervision over indoor level/outdoor unlevel surfaces using cane with step through gait pattern in order to improve community mobility.      Additional Long Term Goals   Additional Long Term Goals  Yes      PT LONG TERM GOAL #6   Title  6MWT or 3MWT goal when appropriate.      PT LONG TERM GOAL #7   Title  Patient will improve gait speed to at least 2.2 ft/sec using LRAD in order to decrease risk of falls.    Baseline  1.41 ft/sec            Plan - 05/03/19 0854    Clinical Impression Statement  Today's skilled session focused on strengthening and static balance with no UE support. Fatigue reported with rest breaks taken as needed. HR and SaO2 WFL 's with session. The pt is making progress toward goals and should benefit from continued PT to progress toward unmet goals.    Personal Factors and Comorbidities  Past/Current Experience;Comorbidity 3+    Comorbidities  spinal cord CVA 01/2019, diabetes mellitus, HTN, PCOS, vitamin D deficiency, sp right (2011) and left (2017) hip replacement, peripheral edema, venous insufficiency.    Examination-Activity Limitations  Sit;Squat;Stairs;Stand;Transfers;Locomotion Level    Examination-Participation Restrictions  Meal Prep;Driving;Community Activity    Stability/Clinical Decision Making  Evolving/Moderate complexity    Rehab Potential  Good    PT Frequency  2x / week    PT Duration  8 weeks    PT Treatment/Interventions  ADLs/Self Care Home Management;DME Instruction;Gait training;Stair training;Functional mobility training;Therapeutic activities;Therapeutic exercise;Balance training;Patient/family education;Neuromuscular re-education;Energy conservation    PT Next  Visit Plan  3MWT when able (to assess walking endurance), check HR during activity, add on to initial HEP for LE strengthing/ROM, balance, standing static balance on stable surfaces (EC, partial tandem)    Consulted and Agree with Plan of Care  Patient       Patient will benefit from skilled therapeutic intervention in order to improve the following deficits and impairments:  Abnormal gait, Decreased activity tolerance, Decreased balance, Decreased endurance, Decreased coordination, Decreased mobility, Decreased range of motion, Difficulty walking, Decreased strength, Impaired sensation  Visit Diagnosis: Difficulty in walking, not elsewhere classified  Other symptoms and signs involving the nervous system  Unsteadiness on feet  Muscle weakness (generalized)     Problem List Patient Active Problem List   Diagnosis Date Noted  . Dysesthesia 03/29/2019  . Gait disturbance 03/29/2019  . Spinal cord infarction ([place]) 02/20/2019  . Cauda equina syndrome ([place]) 02/13/2019  . DM2 (diabetes mellitus, type 2) ([place]) 02/13/2019  . Syncope 02/13/2019  . Essential hypertension 02/13/2019  . Class 3 severe obesity with serious comorbidity and body mass index (BMI) of 45.0 to 49.9 in adult ([place]) 10/04/2017  . Class 3 severe obesity without serious comorbidity with body mass index (BMI) of 45.0 to 49.9 in adult ([place]) 04/12/2017  . Vitamin D deficiency 04/12/2017  . Other fatigue 12/31/2016  . SOB (shortness of breath) on exertion 12/31/2016  . Type 2 diabetes mellitus without complication, without long-term current  use of insulin ([place]) 12/31/2016  . Morbid obesity ([place]) 12/31/2016  . OA (osteoarthritis) of hip 12/02/2015    Willow Ora, PTA, [facility] [address], [place], [place] [address] [phone] 05/03/19, 3:29 PM   Name: Laura Blackwell MRN: [id number] Date of Birth: December 23, 1955

## 2019-05-09 ENCOUNTER — Encounter: Payer: Self-pay | Admitting: Physical Therapy

## 2019-05-09 ENCOUNTER — Ambulatory Visit: Payer: 59 | Admitting: Physical Therapy

## 2019-05-09 ENCOUNTER — Other Ambulatory Visit: Payer: Self-pay

## 2019-05-09 DIAGNOSIS — R2681 Unsteadiness on feet: Secondary | ICD-10-CM

## 2019-05-09 DIAGNOSIS — R262 Difficulty in walking, not elsewhere classified: Secondary | ICD-10-CM

## 2019-05-09 DIAGNOSIS — M6281 Muscle weakness (generalized): Secondary | ICD-10-CM

## 2019-05-09 DIAGNOSIS — R29818 Other symptoms and signs involving the nervous system: Secondary | ICD-10-CM

## 2019-05-10 NOTE — Therapy (Signed)
[facility] [address] [place], [place], [address] Phone: [phone]   Fax:  [phone]  Physical Therapy Treatment  Patient Details  Name: Laura Blackwell MRN: [id number] Date of Birth: Feb 27, 1956 Referring Provider (PT): Carol Ada, MD   Encounter Date: 05/09/2019  PT End of Session - 05/09/19 1115    Visit Number  4    Number of Visits  17    Date for PT Re-Evaluation  06/25/19    Authorization Type  [employer]    Authorization - Visit Number  5    Authorization - Number of Visits  60    PT Start Time  1109   pt running late for appt today   PT Stop Time  1145    PT Time Calculation (min)  36 min    Equipment Utilized During Treatment  Gait belt    Activity Tolerance  Patient tolerated treatment well    Behavior During Therapy  WFL for tasks assessed/performed       Past Medical History:  Diagnosis Date  . Arthritis   . Diabetes mellitus without complication ([place])   . Edema    in left leg in 1980s on left ankle   . Hyperlipidemia   . Hypertension   . PCOS (polycystic ovarian syndrome)   . Superficial thrombophlebitis   . Swelling   . Venous insufficiency   . Vitamin D deficiency     Past Surgical History:  Procedure Laterality Date  . BREAST BIOPSY Right   . JOINT REPLACEMENT  2011    right hip   . left ankle surgery     . TOTAL HIP ARTHROPLASTY Left 12/02/2015   Procedure: LEFT TOTAL HIP ARTHROPLASTY ANTERIOR APPROACH;  Surgeon: [name], MD;  Location: [facility];  Service: Orthopedics;  Laterality: Left;    There were no vitals filed for this visit.  Subjective Assessment - 05/09/19 1114    Subjective  Reports her back has been hurting "aweful" since Thursday, better today. Hot shower is helping and using hot pack. Does not take any pain medicine.    Pertinent History  spinal cord CVA 01/2019, diabetes mellitus, HTN, PCOS, vitamin D deficiency, sp right (2011) and left (2017) hip  replacement, peripheral edema, venous insufficiency.    How long can you walk comfortably?  only household distances, states could not walk around track in therapy gym with cane    Diagnostic tests  MRI showed an abnormal focus in the conus medullaris and lower spinal cord adjacent to T12-L1.    Patient Stated Goals  "wants to be 99.99% better, wants to be the best she can be" - needs to strengthen thigh area    Currently in Pain?  No/denies           [facility] Adult PT Treatment/Exercise - 05/09/19 1117      Transfers   Transfers  Sit to Stand;Stand to Sit    Sit to Stand  5: Supervision    Stand to Sit  5: Supervision      Ambulation/Gait   Ambulation/Gait  Yes    Ambulation/Gait Assistance  5: Supervision;4: Min guard    Ambulation/Gait Assistance Details  use of cane with gait in/around/out of gym. cues for increased step length and more narrowed base of support with gait .    Assistive device  Straight cane    Gait Pattern  Step-to pattern;Decreased step length - right;Decreased arm swing - left;Decreased weight shift to left;Decreased hip/knee flexion -  right;Decreased hip/knee flexion - left;Antalgic;Decreased trunk rotation;Trunk flexed;Wide base of support;Step-through pattern;Decreased stance time - right;Trendelenburg    Ambulation Surface  Level;Indoor      Neuro Re-ed    Neuro Re-ed Details   for balance/muscle re-ed: feet hip width apart on 1 inch foam with EC no head movements, progressing to EC head movements left<>right, up<>down for 10 reps each. min guard to min assist for balance.        Exercises   Exercises  Other Exercises    Other Exercises   seated on large blue pball with intermittent UE support for balance: pelvic rocking ant/post, then laterally for 10 reps each way, pelvic circles for 10 reps each way, with emphasis on tall posture pt performed bil heel/toe raises, long arc quads, alternating marching and alternating combo contralateral UE/LE raises for 10 reps  each. min guard assist with cues on posture/ex form; seated at edge mat- hamstring stretch for 30 sec's for 3-4 reps each side. cues to not bounce.       Knee/Hip Exercises: Aerobic   Other Aerobic  Scifit UE/LE level 2.5 for 8 minutes with goal >/= 40 rpm for strengthening and activity tolerance.               PT Short Term Goals - 05/01/19 1814      PT SHORT TERM GOAL #1   Title  Patient will be independent with initial HEP with focus on strength and balance in order to build upon functional gains in therapy. ALL STGs DUE 05/24/19.    Time  4    Period  Weeks    Status  New    Target Date  05/24/19      PT SHORT TERM GOAL #2   Title  Patient will improve BERG score by at least 3 points to demonstrate decreased fall risk.    Baseline  39/56 on 05/01/19      PT SHORT TERM GOAL #3   Title  Patient will perform 4 steps with both handrails/single railing with step to pattern and supervision in order to safely enter/exit home.    Baseline  not yet assessed      PT SHORT TERM GOAL #4   Title  Patient will ambulate at least 20' with supervision using cane with step through gait pattern in order to improve house hold mobility.      PT SHORT TERM GOAL #5   Title  Patient will decrease TUG score using LRAD to at least 18 seconds in order to decrease risk of falls.    Baseline  20.35 seconds on 04/26/19      PT SHORT TERM GOAL #6   Title  Patient will undergo further testing of endurance with 3 vs. 6MWT. Goal will be written as appropriate.        PT Long Term Goals - 05/01/19 1814      PT LONG TERM GOAL #1   Title  Patient will be independent with final HEP with focus on strength and balance in order to build upon functional gains in therapy. ALL LTGs DUE 06/25/19.    Time  8    Period  Weeks    Status  New      PT LONG TERM GOAL #2   Title  Patient will improve BERG score by at least 7 points to demonstrate decreased fall risk.      PT LONG TERM GOAL #3   Title  Patient  will decrease TUG score  using LRAD to at least 15 seconds in order to decrease risk of falls.      PT LONG TERM GOAL #4   Title  Patient will perform 14 steps with single railing with step to pattern and supervision in order to safely ascend/descend stairs in her home.    Baseline  not yet assessed.      PT LONG TERM GOAL #5   Title  Patient will ambulate at least 350' with supervision over indoor level/outdoor unlevel surfaces using cane with step through gait pattern in order to improve community mobility.      Additional Long Term Goals   Additional Long Term Goals  Yes      PT LONG TERM GOAL #6   Title  6MWT or 3MWT goal when appropriate.      PT LONG TERM GOAL #7   Title  Patient will improve gait speed to at least 2.2 ft/sec using LRAD in order to decrease risk of falls.    Baseline  1.41 ft/sec            Plan - 05/09/19 1115    Clinical Impression Statement  Today's skilled session focused on strengthening and balance. The pt did report decreased pain at end of session to "none". Encouraged pt to ambulate and move more at home to work on decreased pain at home. The pt is making progress toward goals and should benefit from continued PT to progress toward unmet goals.    Personal Factors and Comorbidities  Past/Current Experience;Comorbidity 3+    Comorbidities  spinal cord CVA 01/2019, diabetes mellitus, HTN, PCOS, vitamin D deficiency, sp right (2011) and left (2017) hip replacement, peripheral edema, venous insufficiency.    Examination-Activity Limitations  Sit;Squat;Stairs;Stand;Transfers;Locomotion Level    Examination-Participation Restrictions  Meal Prep;Driving;Community Activity    Stability/Clinical Decision Making  Evolving/Moderate complexity    Rehab Potential  Good    PT Frequency  2x / week    PT Duration  8 weeks    PT Treatment/Interventions  ADLs/Self Care Home Management;DME Instruction;Gait training;Stair training;Functional mobility training;Therapeutic  activities;Therapeutic exercise;Balance training;Patient/family education;Neuromuscular re-education;Energy conservation    PT Next Visit Plan  3MWT when able (to assess walking endurance), check HR during activity, balance, standing static balance on stable surfaces (EC, partial tandem)    Consulted and Agree with Plan of Care  Patient       Patient will benefit from skilled therapeutic intervention in order to improve the following deficits and impairments:  Abnormal gait, Decreased activity tolerance, Decreased balance, Decreased endurance, Decreased coordination, Decreased mobility, Decreased range of motion, Difficulty walking, Decreased strength, Impaired sensation  Visit Diagnosis: Difficulty in walking, not elsewhere classified  Unsteadiness on feet  Muscle weakness (generalized)  Other symptoms and signs involving the nervous system     Problem List Patient Active Problem List   Diagnosis Date Noted  . Dysesthesia 03/29/2019  . Gait disturbance 03/29/2019  . Spinal cord infarction ([place]) 02/20/2019  . Cauda equina syndrome ([place]) 02/13/2019  . DM2 (diabetes mellitus, type 2) ([place]) 02/13/2019  . Syncope 02/13/2019  . Essential hypertension 02/13/2019  . Class 3 severe obesity with serious comorbidity and body mass index (BMI) of 45.0 to 49.9 in adult ([place]) 10/04/2017  . Class 3 severe obesity without serious comorbidity with body mass index (BMI) of 45.0 to 49.9 in adult ([place]) 04/12/2017  . Vitamin D deficiency 04/12/2017  . Other fatigue 12/31/2016  . SOB (shortness of breath) on exertion 12/31/2016  . Type 2  diabetes mellitus without complication, without long-term current use of insulin ([place]) 12/31/2016  . Morbid obesity ([place]) 12/31/2016  . OA (osteoarthritis) of hip 12/02/2015    Willow Ora, PTA, [facility] [address] [place], [place] [address] [phone] 05/10/19, 10:46 AM   Name: Laura Blackwell MRN: [id number] Date  of Birth: 11/05/1955

## 2019-05-12 ENCOUNTER — Encounter: Payer: Self-pay | Admitting: Physical Therapy

## 2019-05-12 ENCOUNTER — Ambulatory Visit: Payer: 59 | Admitting: Physical Therapy

## 2019-05-12 ENCOUNTER — Other Ambulatory Visit: Payer: Self-pay

## 2019-05-12 DIAGNOSIS — R262 Difficulty in walking, not elsewhere classified: Secondary | ICD-10-CM | POA: Diagnosis not present

## 2019-05-12 DIAGNOSIS — R208 Other disturbances of skin sensation: Secondary | ICD-10-CM

## 2019-05-12 DIAGNOSIS — M6281 Muscle weakness (generalized): Secondary | ICD-10-CM

## 2019-05-12 DIAGNOSIS — R2681 Unsteadiness on feet: Secondary | ICD-10-CM

## 2019-05-12 DIAGNOSIS — R29818 Other symptoms and signs involving the nervous system: Secondary | ICD-10-CM

## 2019-05-12 DIAGNOSIS — Z9181 History of falling: Secondary | ICD-10-CM

## 2019-05-12 NOTE — Therapy (Signed)
[facility] [address] [place], [place], [address] Phone: [phone]   Fax:  [phone]  Physical Therapy Treatment  Patient Details  Name: Laura Blackwell MRN: [id number] Date of Birth: May 25, 1956 Referring Provider (PT): Carol Ada, MD   Encounter Date: 05/12/2019  PT End of Session - 05/12/19 1221    Visit Number  5    Number of Visits  17    Date for PT Re-Evaluation  06/25/19    Authorization Type  UHC    Authorization - Visit Number  5    Authorization - Number of Visits  60    PT Start Time  0803    PT Stop Time  [id number]    PT Time Calculation (min)  41 min    Equipment Utilized During Treatment  Gait belt    Activity Tolerance  Patient tolerated treatment well    Behavior During Therapy  [facility] for tasks assessed/performed       Past Medical History:  Diagnosis Date  . Arthritis   . Diabetes mellitus without complication ([place])   . Edema    in left leg in 1980s on left ankle   . Hyperlipidemia   . Hypertension   . PCOS (polycystic ovarian syndrome)   . Superficial thrombophlebitis   . Swelling   . Venous insufficiency   . Vitamin D deficiency     Past Surgical History:  Procedure Laterality Date  . BREAST BIOPSY Right   . JOINT REPLACEMENT  2011    right hip   . left ankle surgery     . TOTAL HIP ARTHROPLASTY Left 12/02/2015   Procedure: LEFT TOTAL HIP ARTHROPLASTY ANTERIOR APPROACH;  Surgeon: [name], MD;  Location: [facility];  Service: Orthopedics;  Laterality: Left;    There were no vitals filed for this visit.  Subjective Assessment - 05/12/19 0806    Subjective  States that her low back has been hurting right in the center and that her right hip has been hurting since July. Has to sleep on her back of her side. Has to look for handicapped spots because she can't step over curbs.  No falls, feels like everything is currently swaying to the left.    Pertinent History  spinal cord CVA  01/2019, diabetes mellitus, HTN, PCOS, vitamin D deficiency, sp right (2011) and left (2017) hip replacement, peripheral edema, venous insufficiency.    How long can you walk comfortably?  only household distances, states could not walk around track in therapy gym with cane    Diagnostic tests  MRI showed an abnormal focus in the conus medullaris and lower spinal cord adjacent to T12-L1.    Patient Stated Goals  "wants to be 99.99% better, wants to be the best she can be" - needs to strengthen thigh area                       [facility] Adult PT Treatment/Exercise - 05/12/19 0001      Transfers   Comments  Mini squats at edge of mat table: 2 x 5 reps with narrower BOS, 3 x 5 reps with wider BOS to target hip ABD, verbal cues for technique. Seated rest break after each rep of 5.       Ambulation/Gait   Ambulation/Gait  Yes    Ambulation/Gait Assistance  5: Supervision    Ambulation/Gait Assistance Details  Cues for increased step length on R - pt with good carryover  throughout session. When fatigued demonstrates a more antalgic gait pattern    Ambulation Distance (Feet)  70 Feet   plus additional 50 ft   Assistive device  Straight cane    Gait Pattern  Step-to pattern;Decreased arm swing - left;Decreased weight shift to left;Decreased hip/knee flexion - right;Decreased hip/knee flexion - left;Antalgic;Decreased trunk rotation;Trunk flexed;Wide base of support;Step-through pattern;Trendelenburg;Decreased stance time - left;Decreased step length - right    Ambulation Surface  Level;Indoor      Therapeutic Activites    Therapeutic Activities  Other Therapeutic Activities    Other Therapeutic Activities  Step ups onto 4" step - pt with increased difficulty clearing LLE to meet RLE onto step. Needs one hand hold assist on // bars for all activites. 1 x 10 reps B. Brief standing rest break in between. After performing 2 sets pt's HR was approx. 130 bpm, after seated rest break - decreased  back down to 100 bpm.       Exercises   Exercises  Lumbar      Lumbar Exercises: Stretches   Passive Hamstring Stretch  3 reps;30 seconds    Passive Hamstring Stretch Limitations  sitting at the edge of the mat - performed B, cueing for technique, added to HEP    Other Lumbar Stretch Exercise  Seated forward flexion stretch at edge of bed - 4 x 20 seconds, added to HEP               PT Short Term Goals - 05/01/19 1814      PT SHORT TERM GOAL #1   Title  Patient will be independent with initial HEP with focus on strength and balance in order to build upon functional gains in therapy. ALL STGs DUE 05/24/19.    Time  4    Period  Weeks    Status  New    Target Date  05/24/19      PT SHORT TERM GOAL #2   Title  Patient will improve BERG score by at least 3 points to demonstrate decreased fall risk.    Baseline  39/56 on 05/01/19      PT SHORT TERM GOAL #3   Title  Patient will perform 4 steps with both handrails/single railing with step to pattern and supervision in order to safely enter/exit home.    Baseline  not yet assessed      PT SHORT TERM GOAL #4   Title  Patient will ambulate at least 2' with supervision using cane with step through gait pattern in order to improve house hold mobility.      PT SHORT TERM GOAL #5   Title  Patient will decrease TUG score using LRAD to at least 18 seconds in order to decrease risk of falls.    Baseline  20.35 seconds on 04/26/19      PT SHORT TERM GOAL #6   Title  Patient will undergo further testing of endurance with 3 vs. 6MWT. Goal will be written as appropriate.        PT Long Term Goals - 05/01/19 1814      PT LONG TERM GOAL #1   Title  Patient will be independent with final HEP with focus on strength and balance in order to build upon functional gains in therapy. ALL LTGs DUE 06/25/19.    Time  8    Period  Weeks    Status  New      PT LONG TERM GOAL #2   Title  Patient will improve BERG score by at least 7 points to  demonstrate decreased fall risk.      PT LONG TERM GOAL #3   Title  Patient will decrease TUG score using LRAD to at least 15 seconds in order to decrease risk of falls.      PT LONG TERM GOAL #4   Title  Patient will perform 14 steps with single railing with step to pattern and supervision in order to safely ascend/descend stairs in her home.    Baseline  not yet assessed.      PT LONG TERM GOAL #5   Title  Patient will ambulate at least 350' with supervision over indoor level/outdoor unlevel surfaces using cane with step through gait pattern in order to improve community mobility.      Additional Long Term Goals   Additional Long Term Goals  Yes      PT LONG TERM GOAL #6   Title  6MWT or 3MWT goal when appropriate.      PT LONG TERM GOAL #7   Title  Patient will improve gait speed to at least 2.2 ft/sec using LRAD in order to decrease risk of falls.    Baseline  1.41 ft/sec            Plan - 05/12/19 1227    Clinical Impression Statement  Provided pt with 2 stretches to decrease low back pain/discomfort - after performing multiple reps pt reported feeling no discomfort, added to HEP. Focus of today's session was LE strengthening with mini squats and step ups. Seated rest breaks provided throughout session due to fatigue. Pt's HR at rest is normally tachycardic at approx. 95-98 bpm- increased today to 130 bpm with forward step ups - able to decrease back to baseline with seated rest break. Will continue to progress towards LTGs.    Personal Factors and Comorbidities  Past/Current Experience;Comorbidity 3+    Comorbidities  spinal cord CVA 01/2019, diabetes mellitus, HTN, PCOS, vitamin D deficiency, sp right (2011) and left (2017) hip replacement, peripheral edema, venous insufficiency.    Examination-Activity Limitations  Sit;Squat;Stairs;Stand;Transfers;Locomotion Level    Examination-Participation Restrictions  Meal Prep;Driving;Community Activity    Stability/Clinical Decision  Making  Evolving/Moderate complexity    Rehab Potential  Good    PT Frequency  2x / week    PT Duration  8 weeks    PT Treatment/Interventions  ADLs/Self Care Home Management;DME Instruction;Gait training;Stair training;Functional mobility training;Therapeutic activities;Therapeutic exercise;Balance training;Patient/family education;Neuromuscular re-education;Energy conservation    PT Next Visit Plan  Check HR during activity, balance, standing static balance on stable surfaces (EC, partial tandem), proximal LE strengthening, step ups. 3 minute walk test when appropriate    PT Home Exercise Plan  [email or web address] - plus seated hamstring stretch and seated forward flexion low back stretch    Consulted and Agree with Plan of Care  Patient       Patient will benefit from skilled therapeutic intervention in order to improve the following deficits and impairments:  Abnormal gait, Decreased activity tolerance, Decreased balance, Decreased endurance, Decreased coordination, Decreased mobility, Decreased range of motion, Difficulty walking, Decreased strength, Impaired sensation  Visit Diagnosis: Difficulty in walking, not elsewhere classified  Unsteadiness on feet  Muscle weakness (generalized)  Other symptoms and signs involving the nervous system  History of falling  Other disturbances of skin sensation     Problem List Patient Active Problem List   Diagnosis Date Noted  . Dysesthesia 03/29/2019  . Gait disturbance 03/29/2019  .  Spinal cord infarction ([place]) 02/20/2019  . Cauda equina syndrome ([place]) 02/13/2019  . DM2 (diabetes mellitus, type 2) ([place]) 02/13/2019  . Syncope 02/13/2019  . Essential hypertension 02/13/2019  . Class 3 severe obesity with serious comorbidity and body mass index (BMI) of 45.0 to 49.9 in adult ([place]) 10/04/2017  . Class 3 severe obesity without serious comorbidity with body mass index (BMI) of 45.0 to 49.9 in adult ([place]) 04/12/2017  . Vitamin D deficiency  04/12/2017  . Other fatigue 12/31/2016  . SOB (shortness of breath) on exertion 12/31/2016  . Type 2 diabetes mellitus without complication, without long-term current use of insulin ([place]) 12/31/2016  . Morbid obesity ([place]) 12/31/2016  . OA (osteoarthritis) of hip 12/02/2015    Arliss Journey, PT, DPT  05/12/2019, 12:32 PM  [facility] [address] [place], [place], [address] Phone: [phone]   Fax:  [phone]  Name: Laura Black[place] MRN: [id number] Date of Birth: 01/23/1956

## 2019-05-15 ENCOUNTER — Other Ambulatory Visit: Payer: Self-pay

## 2019-05-15 ENCOUNTER — Ambulatory Visit: Payer: 59 | Admitting: Physical Therapy

## 2019-05-15 ENCOUNTER — Encounter: Payer: Self-pay | Admitting: Physical Therapy

## 2019-05-15 DIAGNOSIS — M6281 Muscle weakness (generalized): Secondary | ICD-10-CM

## 2019-05-15 DIAGNOSIS — R262 Difficulty in walking, not elsewhere classified: Secondary | ICD-10-CM | POA: Diagnosis not present

## 2019-05-15 NOTE — Patient Instructions (Addendum)
Access Code: [email or web address]  URL: [email or web address]  Date: 05/15/2019  Prepared by: Mady Haagensen   Exercises  Heel rises with counter support - 10 reps - 2 sets - 1x daily - 7x weekly  Standing Hip Abduction with Counter Support - 5 reps - 3 sets - 2x daily - 7x weekly  Standing Marching - 5 reps - 3 sets - 2x daily - 7x weekly  Standing Knee Flexion AROM with Chair Support - 5 reps - 3 sets - 2x daily - 7x weekly   Added to HEP 05/15/2019 Seated Ankle Plantar Flexion with Resistance Loop - 10 reps - 2-3 sets - 3 sec hold - 1x daily - 7x weekly  Mini Squat - 10 reps - 2 sets - 1x daily - 7x weekly

## 2019-05-15 NOTE — Therapy (Signed)
[facility] [address] [place], [place], [address] Phone: [phone]   Fax:  [phone]  Physical Therapy Treatment  Patient Details  Name: DEAZIA PONCEDELEON MRN: [id number] Date of Birth: [birth year] Referring Provider (PT): Carol Ada, MD   Encounter Date: 05/15/2019  CLINIC OPERATION CHANGES: Outpatient [facility] is open at lower capacity following universal masking, social distancing, and patient screening.  The patient's COVID risk of complications score is 4.   PT End of Session - 05/15/19 1217    Visit Number  6    Number of Visits  17    Date for PT Re-Evaluation  06/25/19    Authorization Type  [employer]    Authorization - Visit Number  6    Authorization - Number of Visits  60    PT Start Time  0848    PT Stop Time  0928    PT Time Calculation (min)  40 min    Equipment Utilized During Treatment  Gait belt    Activity Tolerance  Patient tolerated treatment well    Behavior During Therapy  WFL for tasks assessed/performed       Past Medical History:  Diagnosis Date  . Arthritis   . Diabetes mellitus without complication ([place])   . Edema    in left leg in 1980s on left ankle   . Hyperlipidemia   . Hypertension   . PCOS (polycystic ovarian syndrome)   . Superficial thrombophlebitis   . Swelling   . Venous insufficiency   . Vitamin D deficiency     Past Surgical History:  Procedure Laterality Date  . BREAST BIOPSY Right   . JOINT REPLACEMENT  2011    right hip   . left ankle surgery     . TOTAL HIP ARTHROPLASTY Left 12/02/2015   Procedure: LEFT TOTAL HIP ARTHROPLASTY ANTERIOR APPROACH;  Surgeon: [name], MD;  Location: [facility];  Service: Orthopedics;  Laterality: Left;    There were no vitals filed for this visit.  Subjective Assessment - 05/15/19 0852    Subjective  Had some pain over the weekend, good pain from strengthening exercises.  Better today.    Pertinent History  spinal  cord CVA 01/2019, diabetes mellitus, HTN, PCOS, vitamin D deficiency, sp right (2011) and left (2017) hip replacement, peripheral edema, venous insufficiency.    How long can you walk comfortably?  only household distances, states could not walk around track in therapy gym with cane    Diagnostic tests  MRI showed an abnormal focus in the conus medullaris and lower spinal cord adjacent to T12-L1.    Patient Stated Goals  "wants to be 99.99% better, wants to be the best she can be" - needs to strengthen thigh area    Currently in Pain?  No/denies                       [facility] Adult PT Treatment/Exercise - 05/15/19 0001      Transfers   Transfers  Sit to Stand;Stand to Sit    Sit to Stand  5: Supervision;Without upper extremity assist;From bed    Stand to Sit  5: Supervision;Without upper extremity assist;To bed    Number of Reps  Other reps (comment);1 set   5 reps   Comments  Mini squats at edge of mat table: 2 x 5 reps with narrower BOS, 1 set x 10 reps with wider BOS to target hip ABD, verbal cues  for technique. Seated rest break briefly, between reps.  Cues for glut activation/hip extension with upright standing.      Ambulation/Gait   Ambulation/Gait  Yes    Ambulation/Gait Assistance  5: Supervision    Ambulation Distance (Feet)  80 Feet   20 ft x 2   Assistive device  Straight cane    Gait Pattern  Step-to pattern;Decreased arm swing - left;Decreased weight shift to left;Decreased hip/knee flexion - right;Decreased hip/knee flexion - left;Antalgic;Decreased trunk rotation;Trunk flexed;Wide base of support;Step-through pattern;Trendelenburg;Decreased stance time - left;Decreased step length - right    Ambulation Surface  Level;Indoor    Stairs  Yes    Stairs Assistance  4: Min assist;4: Min guard    Stairs Assistance Details (indicate cue type and reason)  Cues for full foot placement on step    Stair Management Technique  Step to pattern;Forwards;One rail Left   and  cane   Number of Stairs  2   4 reps   Height of Stairs  8      Exercises   Exercises  Knee/Hip      Knee/Hip Exercises: Standing   Forward Step Up  Right;Left;1 set;10 reps;Hand Hold: 2;Step Height: 6"    Other Standing Knee Exercises  Step taps to 6" step, x 10 reps each leg with UE support    Other Standing Knee Exercises  Stagger stance forward/back rocking to facilitate single leg plantarflexion in standing (posterior leg) to push forward, and gastroc stretch in anterior leg when rocking back., x 10 reps      Knee/Hip Exercises: Seated   Other Seated Knee/Hip Exercises  Seated plantarflexion resisted with green theraband x 10 reps, 2 sets       HR at beginning of session after walking back to gym is 122 bpm, O2 96%.  With seated rest, HR decreased to 94 bpm.  With standing activities, HR increased to 122 bpm and decreases to just at 100 bpm with brief rest breaks.      PT Education - 05/15/19 1216    Education Details  Updates to HEP-see instructions    Person(s) Educated  Patient    Methods  Explanation;Demonstration;Handout    Comprehension  Verbalized understanding;Returned demonstration       PT Short Term Goals - 05/01/19 1814      PT SHORT TERM GOAL #1   Title  Patient will be independent with initial HEP with focus on strength and balance in order to build upon functional gains in therapy. ALL STGs DUE 05/24/19.    Time  4    Period  Weeks    Status  New    Target Date  05/24/19      PT SHORT TERM GOAL #2   Title  Patient will improve BERG score by at least 3 points to demonstrate decreased fall risk.    Baseline  39/56 on 05/01/19      PT SHORT TERM GOAL #3   Title  Patient will perform 4 steps with both handrails/single railing with step to pattern and supervision in order to safely enter/exit home.    Baseline  not yet assessed      PT SHORT TERM GOAL #4   Title  Patient will ambulate at least 12' with supervision using cane with step through gait  pattern in order to improve house hold mobility.      PT SHORT TERM GOAL #5   Title  Patient will decrease TUG score using LRAD to at least  18 seconds in order to decrease risk of falls.    Baseline  20.35 seconds on 04/26/19      PT SHORT TERM GOAL #6   Title  Patient will undergo further testing of endurance with 3 vs. 6MWT. Goal will be written as appropriate.        PT Long Term Goals - 05/01/19 1814      PT LONG TERM GOAL #1   Title  Patient will be independent with final HEP with focus on strength and balance in order to build upon functional gains in therapy. ALL LTGs DUE 06/25/19.    Time  8    Period  Weeks    Status  New      PT LONG TERM GOAL #2   Title  Patient will improve BERG score by at least 7 points to demonstrate decreased fall risk.      PT LONG TERM GOAL #3   Title  Patient will decrease TUG score using LRAD to at least 15 seconds in order to decrease risk of falls.      PT LONG TERM GOAL #4   Title  Patient will perform 14 steps with single railing with step to pattern and supervision in order to safely ascend/descend stairs in her home.    Baseline  not yet assessed.      PT LONG TERM GOAL #5   Title  Patient will ambulate at least 350' with supervision over indoor level/outdoor unlevel surfaces using cane with step through gait pattern in order to improve community mobility.      Additional Long Term Goals   Additional Long Term Goals  Yes      PT LONG TERM GOAL #6   Title  6MWT or 3MWT goal when appropriate.      PT LONG TERM GOAL #7   Title  Patient will improve gait speed to at least 2.2 ft/sec using LRAD in order to decrease risk of falls.    Baseline  1.41 ft/sec            Plan - 05/15/19 1217    Clinical Impression Statement  Continued to work on lower extremity strengthening, with quads/hamstrings in sit<>stand, squat, and step up activities, as well as gastroc strengthening in resisted sitting position and stagger stance position.  Pt  will continue to benefit from skilled PT to address strength, balance, gait training towards improved independence.  HR >100 bpm for majority of session today, going as high as 122 bpm with step up activity today, and lowers to approximately 100 with seated rest break.    Personal Factors and Comorbidities  Past/Current Experience;Comorbidity 3+    Comorbidities  spinal cord CVA 01/2019, diabetes mellitus, HTN, PCOS, vitamin D deficiency, sp right (2011) and left (2017) hip replacement, peripheral edema, venous insufficiency.    Examination-Activity Limitations  Sit;Squat;Stairs;Stand;Transfers;Locomotion Level    Examination-Participation Restrictions  Meal Prep;Driving;Community Activity    Stability/Clinical Decision Making  Evolving/Moderate complexity    Rehab Potential  Good    PT Frequency  2x / week    PT Duration  8 weeks    PT Treatment/Interventions  ADLs/Self Care Home Management;DME Instruction;Gait training;Stair training;Functional mobility training;Therapeutic activities;Therapeutic exercise;Balance training;Patient/family education;Neuromuscular re-education;Energy conservation    PT Next Visit Plan  Check HR during activity; try to check 3 minute walk with cane and with walker (?walker for less strenous walking to improve endurance for gait?); review HEP for continued stregnthening    PT Home Exercise Plan  [id number] - plus seated hamstring stretch and seated forward flexion low back stretch    Consulted and Agree with Plan of Care  Patient       Patient will benefit from skilled therapeutic intervention in order to improve the following deficits and impairments:  Abnormal gait, Decreased activity tolerance, Decreased balance, Decreased endurance, Decreased coordination, Decreased mobility, Decreased range of motion, Difficulty walking, Decreased strength, Impaired sensation  Visit Diagnosis: Muscle weakness (generalized)     Problem List Patient Active Problem List    Diagnosis Date Noted  . Dysesthesia 03/29/2019  . Gait disturbance 03/29/2019  . Spinal cord infarction ([place]) 02/20/2019  . Cauda equina syndrome ([place]) 02/13/2019  . DM2 (diabetes mellitus, type 2) ([place]) 02/13/2019  . Syncope 02/13/2019  . Essential hypertension 02/13/2019  . Class 3 severe obesity with serious comorbidity and body mass index (BMI) of 45.0 to 49.9 in adult ([place]) 10/04/2017  . Class 3 severe obesity without serious comorbidity with body mass index (BMI) of 45.0 to 49.9 in adult ([place]) 04/12/2017  . Vitamin D deficiency 04/12/2017  . Other fatigue 12/31/2016  . SOB (shortness of breath) on exertion 12/31/2016  . Type 2 diabetes mellitus without complication, without long-term current use of insulin ([place]) 12/31/2016  . Morbid obesity ([place]) 12/31/2016  . OA (osteoarthritis) of hip 12/02/2015    MARRIOTT,AMY W. 05/15/2019, 12:22 PM  Frazier Butt., PT  [facility] [address] [place], [place], [address] Phone: [phone]   Fax:  [phone]  Name: ANGELIKA DERMER MRN: [id number] Date of Birth: 1956/08/25

## 2019-05-17 ENCOUNTER — Encounter: Payer: Self-pay | Admitting: Physical Therapy

## 2019-05-17 ENCOUNTER — Ambulatory Visit: Payer: 59 | Admitting: Physical Therapy

## 2019-05-17 ENCOUNTER — Other Ambulatory Visit: Payer: Self-pay

## 2019-05-17 DIAGNOSIS — R262 Difficulty in walking, not elsewhere classified: Secondary | ICD-10-CM

## 2019-05-17 DIAGNOSIS — M6281 Muscle weakness (generalized): Secondary | ICD-10-CM

## 2019-05-17 NOTE — Therapy (Signed)
[facility] [address] [place], [place], [address] Phone: [phone]   Fax:  [phone]  Physical Therapy Treatment  Patient Details  Name: Laura Blackwell MRN: [id number] Date of Birth: Dec 28, 1955 Referring Provider (PT): Carol Ada, MD   Encounter Date: 05/17/2019  CLINIC OPERATION CHANGES: Outpatient [facility] is open at lower capacity following universal masking, social distancing, and patient screening.  The patient's COVID risk of complications score is 4.   PT End of Session - 05/17/19 0949    Visit Number  7    Number of Visits  17    Date for PT Re-Evaluation  06/25/19    Authorization Type  UHC    Authorization - Visit Number  7    Authorization - Number of Visits  60    PT Start Time  0850    PT Stop Time  0931    PT Time Calculation (min)  41 min    Activity Tolerance  Patient tolerated treatment well    Behavior During Therapy  [facility] for tasks assessed/performed       Past Medical History:  Diagnosis Date  . Arthritis   . Diabetes mellitus without complication ([place])   . Edema    in left leg in 1980s on left ankle   . Hyperlipidemia   . Hypertension   . PCOS (polycystic ovarian syndrome)   . Superficial thrombophlebitis   . Swelling   . Venous insufficiency   . Vitamin D deficiency     Past Surgical History:  Procedure Laterality Date  . BREAST BIOPSY Right   . JOINT REPLACEMENT  2011    right hip   . left ankle surgery     . TOTAL HIP ARTHROPLASTY Left 12/02/2015   Procedure: LEFT TOTAL HIP ARTHROPLASTY ANTERIOR APPROACH;  Surgeon: [name], MD;  Location: [facility];  Service: Orthopedics;  Laterality: Left;    There were no vitals filed for this visit.  Subjective Assessment - 05/17/19 0852    Subjective  Had some back pain last night and some stiffness through my hips.  Better today.    Pertinent History  spinal cord CVA 01/2019, diabetes mellitus, HTN, PCOS, vitamin D  deficiency, sp right (2011) and left (2017) hip replacement, peripheral edema, venous insufficiency.    How long can you walk comfortably?  only household distances, states could not walk around track in therapy gym with cane    Diagnostic tests  MRI showed an abnormal focus in the conus medullaris and lower spinal cord adjacent to T12-L1.    Patient Stated Goals  "wants to be 99.99% better, wants to be the best she can be" - needs to strengthen thigh area    Currently in Pain?  No/denies                       [facility] Adult PT Treatment/Exercise - 05/17/19 0001      Transfers   Transfers  Sit to Stand;Stand to Sit    Sit to Stand  5: Supervision;With upper extremity assist;From chair/3-in-1    Stand to Sit  5: Supervision;With upper extremity assist;To chair/3-in-1    Number of Reps  Other reps (comment)   5 reps throughout session     Ambulation/Gait   Ambulation/Gait  Yes    Ambulation/Gait Assistance  5: Supervision    Ambulation/Gait Assistance Details  3 min walk performed with [facility] and with RW-see details below    Ambulation  Distance (Feet)  255 Feet   total with cane; 275 ft total with RW   Assistive device  Straight cane;Rolling walker   Used bariatric RW   Gait Pattern  Step-to pattern;Decreased arm swing - left;Decreased weight shift to left;Decreased hip/knee flexion - right;Decreased hip/knee flexion - left;Antalgic;Decreased trunk rotation;Trunk flexed;Wide base of support;Step-through pattern;Trendelenburg;Decreased stance time - left;Decreased step length - right   Increased R lateral trunk sway noted with cane   Ambulation Surface  Level;Indoor    Gait Comments  HR pre 3 min walk:  103 bpm, 96%, 210 ft with cane, post HR:  117 bpm.  With RW:  pre-3 minute walk HR:  106 bpm, 97% O2, 266 ft with RW, post HR 108 bpm.  Discussed benefits of walking for exercises with RW for short bouts thorughout the day (improved posture, decreased trunk sway, improved walking  distance).  Provided pt with instructions on walking program for home.      Exercises   Exercises  Knee/Hip;Lumbar      Lumbar Exercises: Seated   Other Seated Lumbar Exercises  Seated abdominal activation, with UE lifts x 5 reps, then marching in place 2 sets x 5 reps; alternating UE/LE lifts x 5 reps with abdominal activation      Knee/Hip Exercises: Standing   Other Standing Knee Exercises  Reviewed minisquats added to HEP last visit, with pt return demo understanding (performed x 10 reps)      Knee/Hip Exercises: Seated   Other Seated Knee/Hip Exercises  Seated plantarflexion resisted with green theraband x 10 reps-reviewed from last visit HEP, with pt return demo understanding.    Other Seated Knee/Hip Exercises  Seated heel digs/glut sets x 2 sets of 10 reps             PT Education - 05/17/19 0948    Education Details  Updates to HEP-see instructions    Person(s) Educated  Patient    Methods  Explanation;Demonstration;Verbal cues;Handout    Comprehension  Verbalized understanding;Returned demonstration       PT Short Term Goals - 05/01/19 1814      PT SHORT TERM GOAL #1   Title  Patient will be independent with initial HEP with focus on strength and balance in order to build upon functional gains in therapy. ALL STGs DUE 05/24/19.    Time  4    Period  Weeks    Status  New    Target Date  05/24/19      PT SHORT TERM GOAL #2   Title  Patient will improve BERG score by at least 3 points to demonstrate decreased fall risk.    Baseline  39/56 on 05/01/19      PT SHORT TERM GOAL #3   Title  Patient will perform 4 steps with both handrails/single railing with step to pattern and supervision in order to safely enter/exit home.    Baseline  not yet assessed      PT SHORT TERM GOAL #4   Title  Patient will ambulate at least 68' with supervision using cane with step through gait pattern in order to improve house hold mobility.      PT SHORT TERM GOAL #5   Title   Patient will decrease TUG score using LRAD to at least 18 seconds in order to decrease risk of falls.    Baseline  20.35 seconds on 04/26/19      PT SHORT TERM GOAL #6   Title  Patient will undergo further  testing of endurance with 3 vs. 6MWT. Goal will be written as appropriate.        PT Long Term Goals - 05/01/19 1814      PT LONG TERM GOAL #1   Title  Patient will be independent with final HEP with focus on strength and balance in order to build upon functional gains in therapy. ALL LTGs DUE 06/25/19.    Time  8    Period  Weeks    Status  New      PT LONG TERM GOAL #2   Title  Patient will improve BERG score by at least 7 points to demonstrate decreased fall risk.      PT LONG TERM GOAL #3   Title  Patient will decrease TUG score using LRAD to at least 15 seconds in order to decrease risk of falls.      PT LONG TERM GOAL #4   Title  Patient will perform 14 steps with single railing with step to pattern and supervision in order to safely ascend/descend stairs in her home.    Baseline  not yet assessed.      PT LONG TERM GOAL #5   Title  Patient will ambulate at least 350' with supervision over indoor level/outdoor unlevel surfaces using cane with step through gait pattern in order to improve community mobility.      Additional Long Term Goals   Additional Long Term Goals  Yes      PT LONG TERM GOAL #6   Title  6MWT or 3MWT goal when appropriate.      PT LONG TERM GOAL #7   Title  Patient will improve gait speed to at least 2.2 ft/sec using LRAD in order to decrease risk of falls.    Baseline  1.41 ft/sec            Plan - 05/17/19 0949    Clinical Impression Statement  3 min walk tests performed today with SPC and with RW, with pt able to ambulate at least 50 ft greater in 3 minutes with RW; pt has to have seated rest break with both sets of gait, but able to walk 2 minutes with RW prior to rest break compared to 1:30 prior to rest break with cane.  Also, with RW,  pt noted to have decreased lateral trunk sway.  Discussed and practiced abdominal activation for improved trunk stability and recommended walking program for home with RW.  Pt agreeable to this.  HR does not go above 117 bpm with checking throughout session.    Personal Factors and Comorbidities  Past/Current Experience;Comorbidity 3+    Comorbidities  spinal cord CVA 01/2019, diabetes mellitus, HTN, PCOS, vitamin D deficiency, sp right (2011) and left (2017) hip replacement, peripheral edema, venous insufficiency.    Examination-Activity Limitations  Sit;Squat;Stairs;Stand;Transfers;Locomotion Level    Examination-Participation Restrictions  Meal Prep;Driving;Community Activity    Stability/Clinical Decision Making  Evolving/Moderate complexity    Rehab Potential  Good    PT Frequency  2x / week    PT Duration  8 weeks    PT Treatment/Interventions  ADLs/Self Care Home Management;DME Instruction;Gait training;Stair training;Functional mobility training;Therapeutic activities;Therapeutic exercise;Balance training;Patient/family education;Neuromuscular re-education;Energy conservation    PT Next Visit Plan  Check HR during activity (pt states MD is aware of this, b/c she had seen cardiologist in Aug and HR was elevated then?)-review HEP given 05/17/2019; continued core and lower extremity strenghtening.  Check STGs next week.    PT Home Exercise Plan  [id number] - plus seated hamstring stretch and seated forward flexion low back stretch    Consulted and Agree with Plan of Care  Patient       Patient will benefit from skilled therapeutic intervention in order to improve the following deficits and impairments:  Abnormal gait, Decreased activity tolerance, Decreased balance, Decreased endurance, Decreased coordination, Decreased mobility, Decreased range of motion, Difficulty walking, Decreased strength, Impaired sensation  Visit Diagnosis: Difficulty in walking, not elsewhere classified  Muscle  weakness (generalized)     Problem List Patient Active Problem List   Diagnosis Date Noted  . Dysesthesia 03/29/2019  . Gait disturbance 03/29/2019  . Spinal cord infarction ([place]) 02/20/2019  . Cauda equina syndrome ([place]) 02/13/2019  . DM2 (diabetes mellitus, type 2) ([place]) 02/13/2019  . Syncope 02/13/2019  . Essential hypertension 02/13/2019  . Class 3 severe obesity with serious comorbidity and body mass index (BMI) of 45.0 to 49.9 in adult ([place]) 10/04/2017  . Class 3 severe obesity without serious comorbidity with body mass index (BMI) of 45.0 to 49.9 in adult ([place]) 04/12/2017  . Vitamin D deficiency 04/12/2017  . Other fatigue 12/31/2016  . SOB (shortness of breath) on exertion 12/31/2016  . Type 2 diabetes mellitus without complication, without long-term current use of insulin ([place]) 12/31/2016  . Morbid obesity ([place]) 12/31/2016  . OA (osteoarthritis) of hip 12/02/2015    Neira Bentsen W. 05/17/2019, 9:57 AM  Mady Haagensen, PT 05/17/19 9:58 AM Phone: [phone] Fax: [facility] [address] [place], [place], [address] Phone: [phone]   Fax:  [phone]  Name: Laura Blackwell MRN: [id number] Date of Birth: [birth year]

## 2019-05-17 NOTE — Patient Instructions (Signed)
1-Seated heel digs:  -Scoot out to the edge of the chair, and sit tall  -Dig your heels in and back, hold 3 seconds and then relax  -Repeat 10 times, 2 sets per day  2-Seated core activation  -Sitting at the edge of your chair  -Tuck your belly button towards your spine to warm up your abdominal muscles, hold 3 seconds then relax  -Repeat 10 times, 2-3 sets per day  Walking program:   -Walk for 3 minutes with your walker, indoors, staying tall and keeping your abdominal muscles tight  -if you need to take a break, do that briefly and then complete the 3 minutes of your walk.  -Do this 3 times per day.

## 2019-05-26 ENCOUNTER — Encounter: Payer: Self-pay | Admitting: Physical Therapy

## 2019-05-26 ENCOUNTER — Other Ambulatory Visit: Payer: Self-pay

## 2019-05-26 ENCOUNTER — Ambulatory Visit: Payer: 59 | Admitting: Physical Therapy

## 2019-05-26 DIAGNOSIS — R262 Difficulty in walking, not elsewhere classified: Secondary | ICD-10-CM

## 2019-05-26 DIAGNOSIS — R29818 Other symptoms and signs involving the nervous system: Secondary | ICD-10-CM

## 2019-05-26 DIAGNOSIS — Z9181 History of falling: Secondary | ICD-10-CM

## 2019-05-26 DIAGNOSIS — R208 Other disturbances of skin sensation: Secondary | ICD-10-CM

## 2019-05-26 DIAGNOSIS — R2681 Unsteadiness on feet: Secondary | ICD-10-CM

## 2019-05-26 DIAGNOSIS — M6281 Muscle weakness (generalized): Secondary | ICD-10-CM

## 2019-05-26 NOTE — Therapy (Signed)
[facility] [address] [place], [place], [address] Phone: [phone]   Fax:  [phone]  Physical Therapy Treatment  Patient Details  Name: Laura Blackwell MRN: [id number] Date of Birth: 02/04/1956 Referring Provider (PT): Carol Ada, MD   Encounter Date: 05/26/2019  PT End of Session - 05/26/19 1555    Visit Number  8    Number of Visits  17    Date for PT Re-Evaluation  06/25/19    Authorization Type  [employer]    Authorization - Visit Number  8    Authorization - Number of Visits  60    PT Start Time  0802    PT Stop Time  0848    PT Time Calculation (min)  46 min    Equipment Utilized During Treatment  Gait belt    Activity Tolerance  Patient tolerated treatment well    Behavior During Therapy  [facility] for tasks assessed/performed       Past Medical History:  Diagnosis Date  . Arthritis   . Diabetes mellitus without complication ([place])   . Edema    in left leg in 1980s on left ankle   . Hyperlipidemia   . Hypertension   . PCOS (polycystic ovarian syndrome)   . Superficial thrombophlebitis   . Swelling   . Venous insufficiency   . Vitamin D deficiency     Past Surgical History:  Procedure Laterality Date  . BREAST BIOPSY Right   . JOINT REPLACEMENT  2011    right hip   . left ankle surgery     . TOTAL HIP ARTHROPLASTY Left 12/02/2015   Procedure: LEFT TOTAL HIP ARTHROPLASTY ANTERIOR APPROACH;  Surgeon: [name], MD;  Location: [facility];  Service: Orthopedics;  Laterality: Left;    There were no vitals filed for this visit.  Subjective Assessment - 05/26/19 0808    Subjective  Has been walking for 3 minutes for 3 times a day using her RW. Has been performing the seated core activation daily and reports that her back has been feeling better. No falls.    Pertinent History  spinal cord CVA 01/2019, diabetes mellitus, HTN, PCOS, vitamin D deficiency, sp right (2011) and left (2017) hip replacement,  peripheral edema, venous insufficiency.    How long can you walk comfortably?  only household distances, states could not walk around track in therapy gym with cane    Diagnostic tests  MRI showed an abnormal focus in the conus medullaris and lower spinal cord adjacent to T12-L1.    Patient Stated Goals  "wants to be 99.99% better, wants to be the best she can be" - needs to strengthen thigh area    Currently in Pain?  No/denies         [facility] PT Assessment - 05/26/19 0832      Berg Balance Test   Sit to Stand  Able to stand without using hands and stabilize independently    Standing Unsupported  Able to stand safely 2 minutes    Sitting with Back Unsupported but Feet Supported on Floor or Stool  Able to sit safely and securely 2 minutes    Stand to Sit  Sits safely with minimal use of hands    Transfers  Able to transfer safely, minor use of hands    Standing Unsupported with Eyes Closed  Able to stand 10 seconds with supervision    Standing Unsupported with Feet Together  Able to place feet together  independently and stand 1 minute safely    From Standing, Reach Forward with Outstretched Arm  Can reach confidently >25 cm (10")    From Standing Position, Pick up Object from [place] to pick up shoe safely and easily    From Standing Position, Turn to Look Behind Over each Shoulder  Looks behind from both sides and weight shifts well    Turn 360 Degrees  Able to turn 360 degrees safely one side only in 4 seconds or less   3.28 to R, 4.06 to L   Standing Unsupported, Alternately Place Feet on Step/Stool  Needs assistance to keep from falling or unable to try    Standing Unsupported, One Foot in [place] to take small step independently and hold 30 seconds    Standing on One Leg  Unable to try or needs assist to prevent fall    Total Score  44    Berg comment:  44/56      Timed Up and Go Test   Normal TUG (seconds)  16.15   SPC, 17.91 seconds with RW                   [facility] Adult PT Treatment/Exercise - 05/26/19 0001      Ambulation/Gait   Stairs  Yes    Stairs Assistance  5: Supervision;4: Min guard    Stairs Assistance Details (indicate cue type and reason)  Educated pt on step to pattern performing down steps for energy conservation and to decrease reliance on BUE. Able to perform step through pattern for 8 steps with BUE on steps. Min guard for pt ascending/descending steeper 8" steps with single handrail with step to pattern.    Stair Management Technique  Two rails;Alternating pattern;Step to pattern;Forwards    Number of Stairs  4   3 reps, plus 2 steeper steps with one handrail   Height of Stairs  6   8 for higher steps     Exercises   Other Exercises   Reviewed exercises on prior HEP: core activation, hip ABD 1 x 10 reps B (cues for correct technique for hip ABD activation), 1 x 10 B hamstring curls in standing (with cues for eccentric control) - downgraded standing heel raises to seated heel/toe raises              PT Education - 05/26/19 1555    Education Details  progress towards LTGs - reviewed HEP    Person(s) Educated  Patient    Methods  Explanation;Demonstration    Comprehension  Verbalized understanding;Returned demonstration       PT Short Term Goals - 05/26/19 0820      PT SHORT TERM GOAL #1   Title  Patient will be independent with initial HEP with focus on strength and balance in order to build upon functional gains in therapy. ALL STGs DUE 05/24/19.    Time  4    Period  Weeks    Status  Partially Met    Target Date  05/24/19      PT SHORT TERM GOAL #2   Title  Patient will improve BERG score by at least 3 points to demonstrate decreased fall risk.    Baseline  44/56 on 04/25/19    Status  Achieved      PT SHORT TERM GOAL #3   Title  Patient will perform 4 steps with both handrails/single railing with step to pattern and supervision in order to safely enter/exit  home.    Baseline  8  steps with both handrails - initially step through pattern ascending and descending, 2nd set of 4 did step to pattern to decrease stress and use of BUE on stairs.    Status  Achieved      PT SHORT TERM GOAL #4   Title  Patient will ambulate at least 60' with supervision using cane with step through gait pattern in order to improve house hold mobility.    Status  New      PT SHORT TERM GOAL #5   Title  Patient will decrease TUG score using LRAD to at least 18 seconds in order to decrease risk of falls.    Baseline  16.15 seconds on 05/26/19 with SPC, 17.81 seconds with RW    Status  Achieved      PT SHORT TERM GOAL #6   Title  Patient will undergo further testing of endurance with 3 vs. 6MWT. Goal will be written as appropriate.    Status  Achieved        PT Long Term Goals - 05/26/19 1612      PT LONG TERM GOAL #1   Title  Patient will be independent with final HEP with focus on strength and balance in order to build upon functional gains in therapy. ALL LTGs DUE 06/25/19.    Time  8    Period  Weeks    Status  New      PT LONG TERM GOAL #2   Title  Patient will improve BERG score by at least 7 points to demonstrate decreased fall risk.    Baseline  initial = 39/56    Status  New      PT LONG TERM GOAL #3   Title  Patient will decrease TUG score using LRAD to at least 15 seconds in order to decrease risk of falls.    Status  New      PT LONG TERM GOAL #4   Title  Patient will perform 14 steps with single railing with step to pattern and supervision in order to safely ascend/descend stairs in her home.    Baseline  not yet assessed.    Status  New      PT LONG TERM GOAL #5   Title  Patient will ambulate at least 350' with supervision over indoor level/outdoor unlevel surfaces using cane with step through gait pattern in order to improve community mobility.    Status  New      PT LONG TERM GOAL #6   Title  Patient will at least 24' with RW or LRAD during 3MWT to  demonstrate improved walking endurance.    Baseline  266' with RW - 3MWT.    Status  New      PT LONG TERM GOAL #7   Title  Patient will improve gait speed to at least 2.2 ft/sec using LRAD in order to decrease risk of falls.    Baseline  1.41 ft/sec    Status  New            Plan - 05/26/19 1607    Clinical Impression Statement  Focus of today's session was assessing pt's STGs. Pt has achieved STG #2, 3, 5, and 6 in regards to BERG score, TUG time, and performing steps. Based on BERG score, pt is still at a moderate risk for falls - pt improved her TUG with SPC to 16.15 seconds (previously was 20 seconds), but is still at  a higher risk for falls. Unable to assess STG #4 today due to time constraints. Pt's HR after activity and ambulating into clinic is 120 bpm. At rest pt's HR is 100 bpm (per pt cardiologist is aware). Seated rest breaks taken throughout session. Will continue to progress towards LTGs.    Personal Factors and Comorbidities  Past/Current Experience;Comorbidity 3+    Comorbidities  spinal cord CVA 01/2019, diabetes mellitus, HTN, PCOS, vitamin D deficiency, sp right (2011) and left (2017) hip replacement, peripheral edema, venous insufficiency.    Examination-Activity Limitations  Sit;Squat;Stairs;Stand;Transfers;Locomotion Level    Examination-Participation Restrictions  Meal Prep;Driving;Community Activity    Stability/Clinical Decision Making  Evolving/Moderate complexity    Rehab Potential  Good    PT Frequency  2x / week    PT Duration  8 weeks    PT Treatment/Interventions  ADLs/Self Care Home Management;DME Instruction;Gait training;Stair training;Functional mobility training;Therapeutic activities;Therapeutic exercise;Balance training;Patient/family education;Neuromuscular re-education;Energy conservation    PT Next Visit Plan  Check HR during activity (pt states MD is aware of this, b/c she had seen cardiologist in Aug and HR was elevated then?) Check STG #4.  continued core and lower extremity strenghtening. Gait training and endurance.  Stair training.    PT Home Exercise Plan  [id number] - plus seated hamstring stretch and seated forward flexion low back stretch    Consulted and Agree with Plan of Care  Patient       Patient will benefit from skilled therapeutic intervention in order to improve the following deficits and impairments:  Abnormal gait, Decreased activity tolerance, Decreased balance, Decreased endurance, Decreased coordination, Decreased mobility, Decreased range of motion, Difficulty walking, Decreased strength, Impaired sensation  Visit Diagnosis: Difficulty in walking, not elsewhere classified  Muscle weakness (generalized)  Unsteadiness on feet  Other symptoms and signs involving the nervous system  History of falling  Other disturbances of skin sensation     Problem List Patient Active Problem List   Diagnosis Date Noted  . Dysesthesia 03/29/2019  . Gait disturbance 03/29/2019  . Spinal cord infarction ([place]) 02/20/2019  . Cauda equina syndrome ([place]) 02/13/2019  . DM2 (diabetes mellitus, type 2) ([place]) 02/13/2019  . Syncope 02/13/2019  . Essential hypertension 02/13/2019  . Class 3 severe obesity with serious comorbidity and body mass index (BMI) of 45.0 to 49.9 in adult ([place]) 10/04/2017  . Class 3 severe obesity without serious comorbidity with body mass index (BMI) of 45.0 to 49.9 in adult ([place]) 04/12/2017  . Vitamin D deficiency 04/12/2017  . Other fatigue 12/31/2016  . SOB (shortness of breath) on exertion 12/31/2016  . Type 2 diabetes mellitus without complication, without long-term current use of insulin ([place]) 12/31/2016  . Morbid obesity ([place]) 12/31/2016  . OA (osteoarthritis) of hip 12/02/2015    Arliss Journey, PT, DPT 05/26/2019, 4:17 PM  [facility] [address] [place], [place], [address] Phone: [phone]   Fax:   [phone]  Name: Laura Blackwell MRN: [id number] Date of Birth: [birth year]

## 2019-05-29 ENCOUNTER — Other Ambulatory Visit: Payer: Self-pay

## 2019-05-29 ENCOUNTER — Encounter: Payer: Self-pay | Admitting: Physical Therapy

## 2019-05-29 ENCOUNTER — Ambulatory Visit: Payer: 59 | Admitting: Physical Therapy

## 2019-05-29 DIAGNOSIS — R262 Difficulty in walking, not elsewhere classified: Secondary | ICD-10-CM | POA: Diagnosis not present

## 2019-05-29 DIAGNOSIS — Z9181 History of falling: Secondary | ICD-10-CM

## 2019-05-29 DIAGNOSIS — R29818 Other symptoms and signs involving the nervous system: Secondary | ICD-10-CM

## 2019-05-29 DIAGNOSIS — R208 Other disturbances of skin sensation: Secondary | ICD-10-CM

## 2019-05-29 DIAGNOSIS — M6281 Muscle weakness (generalized): Secondary | ICD-10-CM

## 2019-05-29 DIAGNOSIS — R2681 Unsteadiness on feet: Secondary | ICD-10-CM

## 2019-05-29 NOTE — Therapy (Signed)
[facility] [address] [place], [place], [address] Phone: [phone]   Fax:  [phone]  Physical Therapy Treatment  Patient Details  Name: Laura Blackwell MRN: [id number] Date of Birth: Sep 07, 1955 Referring Provider (PT): Carol Ada, MD   Encounter Date: 05/29/2019  PT End of Session - 05/29/19 2130    Visit Number  9    Number of Visits  17    Date for PT Re-Evaluation  06/25/19    Authorization Type  UHC    Authorization - Visit Number  9    Authorization - Number of Visits  60    PT Start Time  0814    PT Stop Time  0928    PT Time Calculation (min)  41 min    Equipment Utilized During Treatment  Gait belt    Activity Tolerance  Patient tolerated treatment well    Behavior During Therapy  [facility] for tasks assessed/performed       Past Medical History:  Diagnosis Date  . Arthritis   . Diabetes mellitus without complication ([place])   . Edema    in left leg in 1980s on left ankle   . Hyperlipidemia   . Hypertension   . PCOS (polycystic ovarian syndrome)   . Superficial thrombophlebitis   . Swelling   . Venous insufficiency   . Vitamin D deficiency     Past Surgical History:  Procedure Laterality Date  . BREAST BIOPSY Right   . JOINT REPLACEMENT  2011    right hip   . left ankle surgery     . TOTAL HIP ARTHROPLASTY Left 12/02/2015   Procedure: LEFT TOTAL HIP ARTHROPLASTY ANTERIOR APPROACH;  Surgeon: [name], MD;  Location: [facility];  Service: Orthopedics;  Laterality: Left;    There were no vitals filed for this visit.  Subjective Assessment - 05/29/19 0852    Subjective  No new complaints. Was unable to walk outside this weekend due to the weather. Legs are feeling more irritated today - not painful.    Pertinent History  spinal cord CVA 01/2019, diabetes mellitus, HTN, PCOS, vitamin D deficiency, sp right (2011) and left (2017) hip replacement, peripheral edema, venous insufficiency.     How long can you walk comfortably?  only household distances, states could not walk around track in therapy gym with cane    Diagnostic tests  MRI showed an abnormal focus in the conus medullaris and lower spinal cord adjacent to T12-L1.    Patient Stated Goals  "wants to be 99.99% better, wants to be the best she can be" - needs to strengthen thigh area    Currently in Pain?  No/denies                       [facility] Adult PT Treatment/Exercise - 05/29/19 2133      Ambulation/Gait   Ambulation/Gait  Yes    Ambulation/Gait Assistance  5: Supervision    Ambulation/Gait Assistance Details  Cues for heel > toe pattern throughout. Took standing rest break after each 115' due to pt's HR elevated to 120 bpm. Pt with less trunk and lateral hip instability when ambulating with RW     Ambulation Distance (Feet)  345 Feet   3 x 115   Assistive device  Rolling walker   bariatric walker   Gait Pattern  Step-to pattern;Decreased arm swing - left;Decreased weight shift to left;Decreased hip/knee flexion - right;Decreased hip/knee flexion - left;Antalgic;Decreased trunk  rotation;Trunk flexed;Wide base of support;Step-through pattern;Trendelenburg;Decreased stance time - left;Decreased step length - right;Lateral hip instability    Ambulation Surface  Level;Indoor      Therapeutic Activites    Other Therapeutic Activities  Standing at [place] for UE support on long black step: performed lateral step ups 1 x 10 reps on LLE, pt reporting increased L knee pain. Forward step ups 1 x 10 reps LLE, 1 x 10 reps RLE. Standing rest breaks taken in between each set. Pt's HR increases to 120 bpm, with prolonged rest, it decreases to 100-105 bpm before continuing with exercise.      Neuro Re-ed    Neuro Re-ed Details   Noted pt's breathing patterns during rest breaks - pt relies primarily of accessory muscles of breathing. Taught pt diaphragmatic breathing in standing, however pt unable to perform even with  verbal, demonstrative, and tactile cues. Instead performed in supine w/ pt semi-reclined on a wedge - pt able to perform diaphragmatic breathing in an anti-gravity position without use of accessory musculature with verbal and tactile cues. Multiple reps performed, added to HEP for pt to practice at home.              PT Education - 05/29/19 2129    Education Details  diaphragmatic breathing technique in supine    Person(s) Educated  Patient    Methods  Explanation;Demonstration;Verbal cues    Comprehension  Verbalized understanding;Returned demonstration;Need further instruction;Verbal cues required;Tactile cues required       PT Short Term Goals - 05/26/19 0820      PT SHORT TERM GOAL #1   Title  Patient will be independent with initial HEP with focus on strength and balance in order to build upon functional gains in therapy. ALL STGs DUE 05/24/19.    Time  4    Period  Weeks    Status  Partially Met    Target Date  05/24/19      PT SHORT TERM GOAL #2   Title  Patient will improve BERG score by at least 3 points to demonstrate decreased fall risk.    Baseline  44/56 on 04/25/19    Status  Achieved      PT SHORT TERM GOAL #3   Title  Patient will perform 4 steps with both handrails/single railing with step to pattern and supervision in order to safely enter/exit home.    Baseline  8 steps with both handrails - initially step through pattern ascending and descending, 2nd set of 4 did step to pattern to decrease stress and use of BUE on stairs.    Status  Achieved      PT SHORT TERM GOAL #4   Title  Patient will ambulate at least 79' with supervision using cane with step through gait pattern in order to improve house hold mobility.    Status  New      PT SHORT TERM GOAL #5   Title  Patient will decrease TUG score using LRAD to at least 18 seconds in order to decrease risk of falls.    Baseline  16.15 seconds on 05/26/19 with SPC, 17.81 seconds with RW    Status  Achieved       PT SHORT TERM GOAL #6   Title  Patient will undergo further testing of endurance with 3 vs. 6MWT. Goal will be written as appropriate.    Status  Achieved        PT Long Term Goals - 05/26/19 5462  PT LONG TERM GOAL #1   Title  Patient will be independent with final HEP with focus on strength and balance in order to build upon functional gains in therapy. ALL LTGs DUE 06/25/19.    Time  8    Period  Weeks    Status  New      PT LONG TERM GOAL #2   Title  Patient will improve BERG score by at least 7 points to demonstrate decreased fall risk.    Baseline  initial = 39/56    Status  New      PT LONG TERM GOAL #3   Title  Patient will decrease TUG score using LRAD to at least 15 seconds in order to decrease risk of falls.    Status  New      PT LONG TERM GOAL #4   Title  Patient will perform 14 steps with single railing with step to pattern and supervision in order to safely ascend/descend stairs in her home.    Baseline  not yet assessed.    Status  New      PT LONG TERM GOAL #5   Title  Patient will ambulate at least 350' with supervision over indoor level/outdoor unlevel surfaces using cane with step through gait pattern in order to improve community mobility.    Status  New      PT LONG TERM GOAL #6   Title  Patient will at least 44' with RW or LRAD during 3MWT to demonstrate improved walking endurance.    Baseline  266' with RW - 3MWT.    Status  New      PT LONG TERM GOAL #7   Title  Patient will improve gait speed to at least 2.2 ft/sec using LRAD in order to decrease risk of falls.    Baseline  1.41 ft/sec    Status  New            Plan - 05/29/19 2132    Clinical Impression Statement  Focus of today's session was gait training with RW and LE strengthening. At rest, pt's HR continues to be 100 bpm and after activity (115' bout of gait), pt's HR rises to 120 bpm - able to decrease back to 100 bpm with seated break. Pt demonstrates less hip instability  and trunk lean with RW, and only requires supervision for heel > toe pattern. Noticed increased use of accessory muscles for breathing during rest breaks - taught pt diaphragmatic breathing in semi-reclined position on wedge (unable to perform in sitting or standing). Pt remains very motivated and will continue to benefit from skilled PT to progress towards LTGs.    Personal Factors and Comorbidities  Past/Current Experience;Comorbidity 3+    Comorbidities  spinal cord CVA 01/2019, diabetes mellitus, HTN, PCOS, vitamin D deficiency, sp right (2011) and left (2017) hip replacement, peripheral edema, venous insufficiency.    Examination-Activity Limitations  Sit;Squat;Stairs;Stand;Transfers;Locomotion Level    Examination-Participation Restrictions  Meal Prep;Driving;Community Activity    Stability/Clinical Decision Making  Evolving/Moderate complexity    Rehab Potential  Good    PT Frequency  2x / week    PT Duration  8 weeks    PT Treatment/Interventions  ADLs/Self Care Home Management;DME Instruction;Gait training;Stair training;Functional mobility training;Therapeutic activities;Therapeutic exercise;Balance training;Patient/family education;Neuromuscular re-education;Energy conservation    PT Next Visit Plan  Check HR during activity (pt states MD is aware of this, b/c she had seen cardiologist in Aug and HR was elevated then?)  review diaphragmatic breathing.  continued core and lower extremity strenghtening. Gait training and endurance.  Stair training.    PT Home Exercise Plan  [id number] - plus seated hamstring stretch and seated forward flexion low back stretch    Consulted and Agree with Plan of Care  Patient       Patient will benefit from skilled therapeutic intervention in order to improve the following deficits and impairments:  Abnormal gait, Decreased activity tolerance, Decreased balance, Decreased endurance, Decreased coordination, Decreased mobility, Decreased range of motion, Difficulty  walking, Decreased strength, Impaired sensation  Visit Diagnosis: Difficulty in walking, not elsewhere classified  Muscle weakness (generalized)  Unsteadiness on feet  Other symptoms and signs involving the nervous system  History of falling  Other disturbances of skin sensation     Problem List Patient Active Problem List   Diagnosis Date Noted  . Dysesthesia 03/29/2019  . Gait disturbance 03/29/2019  . Spinal cord infarction ([place]) 02/20/2019  . Cauda equina syndrome ([place]) 02/13/2019  . DM2 (diabetes mellitus, type 2) ([place]) 02/13/2019  . Syncope 02/13/2019  . Essential hypertension 02/13/2019  . Class 3 severe obesity with serious comorbidity and body mass index (BMI) of 45.0 to 49.9 in adult ([place]) 10/04/2017  . Class 3 severe obesity without serious comorbidity with body mass index (BMI) of 45.0 to 49.9 in adult ([place]) 04/12/2017  . Vitamin D deficiency 04/12/2017  . Other fatigue 12/31/2016  . SOB (shortness of breath) on exertion 12/31/2016  . Type 2 diabetes mellitus without complication, without long-term current use of insulin ([place]) 12/31/2016  . Morbid obesity ([place]) 12/31/2016  . OA (osteoarthritis) of hip 12/02/2015    Arliss Journey, PT, DPT 05/29/2019, 9:40 PM  [facility] [address] [place], [place], [address] Phone: [phone]   Fax:  [phone]  Name: Laura Blackwell MRN: [id number] Date of Birth: June 29, 1956

## 2019-05-31 ENCOUNTER — Encounter: Payer: Self-pay | Admitting: Physical Therapy

## 2019-05-31 ENCOUNTER — Other Ambulatory Visit: Payer: Self-pay

## 2019-05-31 ENCOUNTER — Ambulatory Visit: Payer: 59 | Admitting: Physical Therapy

## 2019-05-31 DIAGNOSIS — M6281 Muscle weakness (generalized): Secondary | ICD-10-CM

## 2019-05-31 DIAGNOSIS — R262 Difficulty in walking, not elsewhere classified: Secondary | ICD-10-CM | POA: Diagnosis not present

## 2019-05-31 DIAGNOSIS — R2681 Unsteadiness on feet: Secondary | ICD-10-CM

## 2019-05-31 DIAGNOSIS — R29818 Other symptoms and signs involving the nervous system: Secondary | ICD-10-CM

## 2019-05-31 DIAGNOSIS — R208 Other disturbances of skin sensation: Secondary | ICD-10-CM

## 2019-05-31 DIAGNOSIS — Z9181 History of falling: Secondary | ICD-10-CM

## 2019-05-31 NOTE — Patient Instructions (Addendum)
Access Code: [email or web address]  URL: [email or web address]  Date: 05/31/2019  Prepared by: Janann August   Exercises Standing Hip Abduction with Counter Support - 5 reps - 3 sets - 2x daily - 7x weekly Standing Marching - 5 reps - 3 sets - 2x daily - 7x weekly Standing Knee Flexion AROM with Chair Support - 5 reps - 3 sets - 2x daily - 7x weekly Seated Ankle Plantar Flexion with Resistance Loop - 10 reps - 2-3 sets - 3 sec hold - 1x daily - 7x weekly Mini Squat - 10 reps - 2 sets - 1x daily - 7x weekly Clamshell - 10 reps - 2 sets - 2x daily - 7x weekly Supine Diaphragmatic Breathing - 10 reps - 3 sets - 1x daily - 7x weekly Hooklying Single Leg Bent Knee Fallouts with Resistance - 10 reps - 2 sets - 2x daily - 7x weekly Hooklying Gluteal Sets - 10 reps - 3 sets - 2x daily - 7x weekly   From prior:  1-Seated heel digs:             -Scoot out to the edge of the chair, and sit tall             -Dig your heels in and back, hold 3 seconds and then relax             -Repeat 10 times, 2 sets per day  2-Seated core activation             -Sitting at the edge of your chair             -Tuck your belly button towards your spine to warm up your abdominal muscles, hold 3 seconds then relax             -Repeat 10 times, 2-3 sets per day  Walking program:              -Walk for 3 minutes with your walker, indoors, staying tall and keeping your abdominal muscles tight             -if you need to take a break, do that briefly and then complete the 3 minutes of your walk.             -Do this 3 times per day.

## 2019-05-31 NOTE — Therapy (Signed)
[facility] [address] [place], [place], [address] Phone: [phone]   Fax:  [phone]  Physical Therapy Treatment  Patient Details  Name: Laura Blackwell MRN: [id number] Date of Birth: 07/05/1956 Referring Provider (PT): Carol Ada, MD   Encounter Date: 05/31/2019  PT End of Session - 05/31/19 0940    Visit Number  10    Number of Visits  17    Date for PT Re-Evaluation  06/25/19    Authorization Type  [employer]    Authorization - Visit Number  10    Authorization - Number of Visits  60    PT Start Time  0848    PT Stop Time  0932    PT Time Calculation (min)  44 min    Activity Tolerance  Patient tolerated treatment well    Behavior During Therapy  [facility] for tasks assessed/performed       Past Medical History:  Diagnosis Date  . Arthritis   . Diabetes mellitus without complication ([place])   . Edema    in left leg in 1980s on left ankle   . Hyperlipidemia   . Hypertension   . PCOS (polycystic ovarian syndrome)   . Superficial thrombophlebitis   . Swelling   . Venous insufficiency   . Vitamin D deficiency     Past Surgical History:  Procedure Laterality Date  . BREAST BIOPSY Right   . JOINT REPLACEMENT  2011    right hip   . left ankle surgery     . TOTAL HIP ARTHROPLASTY Left 12/02/2015   Procedure: LEFT TOTAL HIP ARTHROPLASTY ANTERIOR APPROACH;  Surgeon: [name], MD;  Location: [facility];  Service: Orthopedics;  Laterality: Left;    There were no vitals filed for this visit.  Subjective Assessment - 05/31/19 0852    Subjective  Was unable to walk outside yesterday because of the weather and her son was not home. No falls. [place] - was able to go up a curb with her cane and holding on to the hood of her car.    Pertinent History  spinal cord CVA 01/2019, diabetes mellitus, HTN, PCOS, vitamin D deficiency, sp right (2011) and left (2017) hip replacement,  peripheral edema, venous insufficiency.    How long can you walk comfortably?  only household distances, states could not walk around track in therapy gym with cane    Diagnostic tests  MRI showed an abnormal focus in the conus medullaris and lower spinal cord adjacent to T12-L1.    Patient Stated Goals  "wants to be 99.99% better, wants to be the best she can be" - needs to strengthen thigh area    Currently in Pain?  No/denies                    [facility] Adult PT Treatment/Exercise - 05/31/19 0948      Bed Mobility   Bed Mobility  Supine to Sit  (Pended)    taught pt log roll technique, rolling to L, 2 reps     Therapeutic Activites    Other Therapeutic Activities  Reviewed diaphragmatic breathing technique in hooklying position.       Exercises   Other Exercises   Standing at edge of mat table with chair in front for BUE support: mini squat with side step 2 x 5 and 1 x 10 reps to L (increased difficulty clearing LLE) and 1 x 10 reps  side steps to R       Lumbar Exercises: Supine   Glut Set  3 seconds;20 reps    Glut Set Limitations  in supine hooklying    Clam  10 reps   2 x 10 reps B   Clam Limitations  cues for technique and form, increased difficulty performing with RLE    Other Supine Lumbar Exercises  Bent knee fall outs with red theraband: 2 x 10 reps B, cues for eccentric control           Access Code: [email or web address]  URL: [email or web address]  Date: 05/31/2019  Prepared by: Janann August   Updated HEP:   Exercises Standing Hip Abduction with Counter Support - 5 reps - 3 sets - 2x daily - 7x weekly Standing Marching - 5 reps - 3 sets - 2x daily - 7x weekly Standing Knee Flexion AROM with Chair Support - 5 reps - 3 sets - 2x daily - 7x weekly Seated Ankle Plantar Flexion with Resistance Loop - 10 reps - 2-3 sets - 3 sec hold - 1x daily - 7x weekly Mini Squat - 10 reps - 2 sets - 1x daily - 7x weekly Clamshell - 10 reps - 2 sets - 2x daily - 7x  weekly - new addition  Supine Diaphragmatic Breathing - 10 reps - 3 sets - 1x daily - 7x weekly - new addition  Hooklying Single Leg Bent Knee Fallouts with Resistance - 10 reps - 2 sets - 2x daily - 7x weekly - new addition  Hooklying Gluteal Sets - 10 reps - 3 sets - 2x daily - 7x weekly - new addition       PT Education - 05/31/19 0940    Education Details  new additions to HEP for proximal LE strengthening    Person(s) Educated  Patient    Methods  Explanation;Demonstration;Handout    Comprehension  Verbalized understanding;Returned demonstration       PT Short Term Goals - 05/26/19 0820      PT SHORT TERM GOAL #1   Title  Patient will be independent with initial HEP with focus on strength and balance in order to build upon functional gains in therapy. ALL STGs DUE 05/24/19.    Time  4    Period  Weeks    Status  Partially Met    Target Date  05/24/19      PT SHORT TERM GOAL #2   Title  Patient will improve BERG score by at least 3 points to demonstrate decreased fall risk.    Baseline  44/56 on 04/25/19    Status  Achieved      PT SHORT TERM GOAL #3   Title  Patient will perform 4 steps with both handrails/single railing with step to pattern and supervision in order to safely enter/exit home.    Baseline  8 steps with both handrails - initially step through pattern ascending and descending, 2nd set of 4 did step to pattern to decrease stress and use of BUE on stairs.    Status  Achieved      PT SHORT TERM GOAL #4   Title  Patient will ambulate at least 31' with supervision using cane with step through gait pattern in order to improve house hold mobility.    Status  New      PT SHORT TERM GOAL #5   Title  Patient will decrease TUG score using LRAD to at least 18 seconds in order to decrease risk of  falls.    Baseline  16.15 seconds on 05/26/19 with SPC, 17.81 seconds with RW    Status  Achieved      PT SHORT TERM GOAL #6   Title  Patient will undergo further testing of  endurance with 3 vs. 6MWT. Goal will be written as appropriate.    Status  Achieved        PT Long Term Goals - 05/26/19 1612      PT LONG TERM GOAL #1   Title  Patient will be independent with final HEP with focus on strength and balance in order to build upon functional gains in therapy. ALL LTGs DUE 06/25/19.    Time  8    Period  Weeks    Status  New      PT LONG TERM GOAL #2   Title  Patient will improve BERG score by at least 7 points to demonstrate decreased fall risk.    Baseline  initial = 39/56    Status  New      PT LONG TERM GOAL #3   Title  Patient will decrease TUG score using LRAD to at least 15 seconds in order to decrease risk of falls.    Status  New      PT LONG TERM GOAL #4   Title  Patient will perform 14 steps with single railing with step to pattern and supervision in order to safely ascend/descend stairs in her home.    Baseline  not yet assessed.    Status  New      PT LONG TERM GOAL #5   Title  Patient will ambulate at least 350' with supervision over indoor level/outdoor unlevel surfaces using cane with step through gait pattern in order to improve community mobility.    Status  New      PT LONG TERM GOAL #6   Title  Patient will at least 45' with RW or LRAD during 3MWT to demonstrate improved walking endurance.    Baseline  266' with RW - 3MWT.    Status  New      PT LONG TERM GOAL #7   Title  Patient will improve gait speed to at least 2.2 ft/sec using LRAD in order to decrease risk of falls.    Baseline  1.41 ft/sec    Status  New            Plan - 05/31/19 0943    Clinical Impression Statement  Focus of today's session was proximal LE strengthening - focus on hip ABD and hip extensors. Pt's HR today throughout session at 96-100 bpm. Pt with increased difficulty performing R hip ABD against gravity in clamshell position. Pt able to better demonstrate diaphragmatic breathing technique in hooklying with minimal verbal cues in order to  improve ventilation. Pt is very motivated with therapy - will continue to progress towards LTGs.    Personal Factors and Comorbidities  Past/Current Experience;Comorbidity 3+    Comorbidities  spinal cord CVA 01/2019, diabetes mellitus, HTN, PCOS, vitamin D deficiency, sp right (2011) and left (2017) hip replacement, peripheral edema, venous insufficiency.    Examination-Activity Limitations  Sit;Squat;Stairs;Stand;Transfers;Locomotion Level    Examination-Participation Restrictions  Meal Prep;Driving;Community Activity    Stability/Clinical Decision Making  Evolving/Moderate complexity    Rehab Potential  Good    PT Frequency  2x / week    PT Duration  8 weeks    PT Treatment/Interventions  ADLs/Self Care Home Management;DME Instruction;Gait training;Stair training;Functional mobility training;Therapeutic activities;Therapeutic  exercise;Balance training;Patient/family education;Neuromuscular re-education;Energy conservation    PT Next Visit Plan  Check HR during activity (sent MD an inbasket about HRs going up to 120 bpm after ambulating 1 lap).  Review new additions to HEP. Continued core and LE strengthening.  Gait training and endurance.  Stair training. Practice curb with SPC.    PT Home Exercise Plan  [email or web address] - plus seated hamstring stretch and seated forward flexion low back stretch    Consulted and Agree with Plan of Care  Patient       Patient will benefit from skilled therapeutic intervention in order to improve the following deficits and impairments:  Abnormal gait, Decreased activity tolerance, Decreased balance, Decreased endurance, Decreased coordination, Decreased mobility, Decreased range of motion, Difficulty walking, Decreased strength, Impaired sensation  Visit Diagnosis: Difficulty in walking, not elsewhere classified  Muscle weakness (generalized)  Unsteadiness on feet  Other symptoms and signs involving the nervous system  History of falling  Other disturbances of  skin sensation     Problem List Patient Active Problem List   Diagnosis Date Noted  . Dysesthesia 03/29/2019  . Gait disturbance 03/29/2019  . Spinal cord infarction ([place]) 02/20/2019  . Cauda equina syndrome ([place]) 02/13/2019  . DM2 (diabetes mellitus, type 2) ([place]) 02/13/2019  . Syncope 02/13/2019  . Essential hypertension 02/13/2019  . Class 3 severe obesity with serious comorbidity and body mass index (BMI) of 45.0 to 49.9 in adult ([place]) 10/04/2017  . Class 3 severe obesity without serious comorbidity with body mass index (BMI) of 45.0 to 49.9 in adult ([place]) 04/12/2017  . Vitamin D deficiency 04/12/2017  . Other fatigue 12/31/2016  . SOB (shortness of breath) on exertion 12/31/2016  . Type 2 diabetes mellitus without complication, without long-term current use of insulin ([place]) 12/31/2016  . Morbid obesity ([place]) 12/31/2016  . OA (osteoarthritis) of hip 12/02/2015    Arliss Journey, PT, DPT 05/31/2019, 9:51 AM  [facility] [address] [place], [place], [address] Phone: [phone]   Fax:  [phone]  Name: CHANDRA ASHER MRN: [id number] Date of Birth: 08-03-1956

## 2019-06-05 ENCOUNTER — Telehealth: Payer: Self-pay | Admitting: *Deleted

## 2019-06-05 ENCOUNTER — Telehealth: Payer: Self-pay

## 2019-06-05 DIAGNOSIS — R002 Palpitations: Secondary | ICD-10-CM

## 2019-06-05 NOTE — Telephone Encounter (Signed)
Spoke with pt and went over recommendations.  Pt will come tomorrow for labs. Pt appreciative for call.

## 2019-06-05 NOTE — Telephone Encounter (Signed)
-----   Message from Belva Crome, MD sent at 06/02/2019  7:06 PM EDT ----- Regarding: 7 day monitor. Wear during PT Please wear as much as possible during 7 days, especially during PT. Also needs Hgb and TSH ----- Message ----- From: Arliss Journey, PT Sent: 06/02/2019   3:27 PM EDT To: Belva Crome, MD  Dr. Tamala Julian,  I have been seeing your patient Laura Blackwell at [facility] for the past couple of weeks. Her heart rate at rest is about 96-100 bpm and after light activity (ambulating 115' with rolling walker or ambulating back to therapy gym) her HR increases to about 120-125 bpm. This has been occurring for the past couple of weeks and I wanted to make you aware.  Thank you, Janann August, PT, DPT

## 2019-06-05 NOTE — Telephone Encounter (Signed)
Spoke to pt, went over brief details of monitor. Verified address. Monitor ordered to be mailed to pt's home.

## 2019-06-06 ENCOUNTER — Other Ambulatory Visit: Payer: Self-pay

## 2019-06-06 ENCOUNTER — Other Ambulatory Visit: Payer: 59

## 2019-06-06 ENCOUNTER — Ambulatory Visit: Payer: 59 | Attending: Family Medicine | Admitting: Physical Therapy

## 2019-06-06 DIAGNOSIS — M6281 Muscle weakness (generalized): Secondary | ICD-10-CM | POA: Diagnosis present

## 2019-06-06 DIAGNOSIS — R208 Other disturbances of skin sensation: Secondary | ICD-10-CM

## 2019-06-06 DIAGNOSIS — R209 Unspecified disturbances of skin sensation: Secondary | ICD-10-CM | POA: Diagnosis present

## 2019-06-06 DIAGNOSIS — R29818 Other symptoms and signs involving the nervous system: Secondary | ICD-10-CM | POA: Diagnosis present

## 2019-06-06 DIAGNOSIS — R002 Palpitations: Secondary | ICD-10-CM

## 2019-06-06 DIAGNOSIS — R2681 Unsteadiness on feet: Secondary | ICD-10-CM | POA: Diagnosis present

## 2019-06-06 DIAGNOSIS — Z9181 History of falling: Secondary | ICD-10-CM | POA: Diagnosis present

## 2019-06-06 DIAGNOSIS — R262 Difficulty in walking, not elsewhere classified: Secondary | ICD-10-CM

## 2019-06-06 NOTE — Therapy (Signed)
[facility] [address] [place], [place], [address] Phone: [phone]   Fax:  [phone]  Physical Therapy Treatment  Patient Details  Name: Laura Blackwell MRN: [id number] Date of Birth: 06/02/1956 Referring Provider (PT): Carol Ada, MD   Encounter Date: 06/06/2019  PT End of Session - 06/06/19 2233    Visit Number  11    Number of Visits  17    Date for PT Re-Evaluation  06/25/19    Authorization Type  [employer]    Authorization - Visit Number  11    Authorization - Number of Visits  60    PT Start Time  3491    PT Stop Time  0929    PT Time Calculation (min)  42 min    Activity Tolerance  Patient tolerated treatment well    Behavior During Therapy  [facility] for tasks assessed/performed       Past Medical History:  Diagnosis Date  . Arthritis   . Diabetes mellitus without complication ([place])   . Edema    in left leg in 1980s on left ankle   . Hyperlipidemia   . Hypertension   . PCOS (polycystic ovarian syndrome)   . Superficial thrombophlebitis   . Swelling   . Venous insufficiency   . Vitamin D deficiency     Past Surgical History:  Procedure Laterality Date  . BREAST BIOPSY Right   . JOINT REPLACEMENT  2011    right hip   . left ankle surgery     . TOTAL HIP ARTHROPLASTY Left 12/02/2015   Procedure: LEFT TOTAL HIP ARTHROPLASTY ANTERIOR APPROACH;  Surgeon: [name], MD;  Location: [facility];  Service: Orthopedics;  Laterality: Left;    There were no vitals filed for this visit.  Subjective Assessment - 06/06/19 0851    Subjective  Will be starting a 7 day monitor for HR/rhythm. She is to wear it with therapy, with HEP and all other activities she does.. No falls.    Pertinent History  spinal cord CVA 01/2019, diabetes mellitus, HTN, PCOS, vitamin D deficiency, sp right (2011) and left (2017) hip replacement, peripheral edema, venous insufficiency.    How long can you walk comfortably?  only  household distances, states could not walk around track in therapy gym with cane    Diagnostic tests  MRI showed an abnormal focus in the conus medullaris and lower spinal cord adjacent to T12-L1.    Patient Stated Goals  "wants to be 99.99% better, wants to be the best she can be" - needs to strengthen thigh area    Currently in Pain?  Yes    Pain Score  6     Pain Location  Back    Pain Orientation  Lower    Pain Descriptors / Indicators  Aching;Sharp    Pain Type  Chronic pain    Pain Onset  More than a month ago    Pain Frequency  Constant    Aggravating Factors   unknown "it's just there"    Pain Relieving Factors  movement             [facility] Adult PT Treatment/Exercise - 06/06/19 0855      Transfers   Transfers  Sit to Stand;Stand to Sit    Sit to Stand  5: Supervision;With upper extremity assist;From chair/3-in-1    Stand to Sit  5: Supervision;With upper extremity assist;To chair/3-in-1      Ambulation/Gait   Ambulation/Gait  Yes    Ambulation/Gait Assistance  5: Supervision;4: Min guard    Ambulation/Gait Assistance Details  HR 100 after resting from walking from lobby into clinic.  123 bpm after 230 feet of gait with cane.  cues on posture, increased step length and weight shifting with gait. no balance loss noted with gait.     Ambulation Distance (Feet)  110 Feet   x1, 230 x1   Assistive device  Straight cane    Gait Pattern  Step-to pattern;Decreased arm swing - left;Decreased weight shift to left;Decreased hip/knee flexion - right;Decreased hip/knee flexion - left;Antalgic;Decreased trunk rotation;Trunk flexed;Wide base of support;Step-through pattern;Trendelenburg;Decreased stance time - left;Decreased step length - right;Lateral hip instability    Ambulation Surface  Level;Indoor      Neuro Re-ed    Neuro Re-ed Details   for balance/muscle re-ed: tall kneeling on mat table with UE support on blue bench-  mini squats for 10 reps with cues for equal LE weight  bearing. in tall kneeling- moving LE bwd/fwd for 10 reps each side, then moving LE out/in for 10 reps each side, min guard assist with cues on form/technique; standing on floor with 2 foam bubbles on floor: alternating "light" foot taps, then alternating "light" lateral foot taps with emaphsis on stance position, weight shifting and controlled, light taps, no UE support to occasional touch to sturdy surface for balance. min guard to min assist               Lumbar Exercises: Seated   Other Seated Lumbar Exercises  seated on large blue pball: (HR 108 after ex's performed on ball).- pelvic rocks laterally, then anterior/posterior for 10 reps each, pelvic circles for 10 reps each way, alternating UE raises, alternating long arc quads, then alternating marching for 10 reps each bil sides. cues on posture, form and weight shifting with ex's. min guard to min assist for balance with light UE support on mat table as needed for balance.                Knee/Hip Exercises: Standing   Forward Step Up  Both;2 sets;10 reps;Hand Hold: 2;Step Height: 4";Limitations    Forward Step Up Limitations  cues on form and technique, min guard assist for balance          PT Short Term Goals - 05/26/19 0820      PT SHORT TERM GOAL #1   Title  Patient will be independent with initial HEP with focus on strength and balance in order to build upon functional gains in therapy. ALL STGs DUE 05/24/19.    Time  4    Period  Weeks    Status  Partially Met    Target Date  05/24/19      PT SHORT TERM GOAL #2   Title  Patient will improve BERG score by at least 3 points to demonstrate decreased fall risk.    Baseline  44/56 on 04/25/19    Status  Achieved      PT SHORT TERM GOAL #3   Title  Patient will perform 4 steps with both handrails/single railing with step to pattern and supervision in order to safely enter/exit home.    Baseline  8 steps with both handrails - initially step through pattern ascending and descending,  2nd set of 4 did step to pattern to decrease stress and use of BUE on stairs.    Status  Achieved      PT SHORT TERM GOAL #4  Title  Patient will ambulate at least 22' with supervision using cane with step through gait pattern in order to improve house hold mobility.    Status  New      PT SHORT TERM GOAL #5   Title  Patient will decrease TUG score using LRAD to at least 18 seconds in order to decrease risk of falls.    Baseline  16.15 seconds on 05/26/19 with SPC, 17.81 seconds with RW    Status  Achieved      PT SHORT TERM GOAL #6   Title  Patient will undergo further testing of endurance with 3 vs. 6MWT. Goal will be written as appropriate.    Status  Achieved        PT Long Term Goals - 05/26/19 1612      PT LONG TERM GOAL #1   Title  Patient will be independent with final HEP with focus on strength and balance in order to build upon functional gains in therapy. ALL LTGs DUE 06/25/19.    Time  8    Period  Weeks    Status  New      PT LONG TERM GOAL #2   Title  Patient will improve BERG score by at least 7 points to demonstrate decreased fall risk.    Baseline  initial = 39/56    Status  New      PT LONG TERM GOAL #3   Title  Patient will decrease TUG score using LRAD to at least 15 seconds in order to decrease risk of falls.    Status  New      PT LONG TERM GOAL #4   Title  Patient will perform 14 steps with single railing with step to pattern and supervision in order to safely ascend/descend stairs in her home.    Baseline  not yet assessed.    Status  New      PT LONG TERM GOAL #5   Title  Patient will ambulate at least 350' with supervision over indoor level/outdoor unlevel surfaces using cane with step through gait pattern in order to improve community mobility.    Status  New      PT LONG TERM GOAL #6   Title  Patient will at least 6' with RW or LRAD during 3MWT to demonstrate improved walking endurance.    Baseline  266' with RW - 3MWT.    Status  New       PT LONG TERM GOAL #7   Title  Patient will improve gait speed to at least 2.2 ft/sec using LRAD in order to decrease risk of falls.    Baseline  1.41 ft/sec    Status  New            Plan - 06/06/19 2236    Clinical Impression Statement  Today's skilled session continued to focus on activity tolerance, strengthening and balance with rest breaks needed due to fatigue. The pt is progressing toward goals and should benefit from continued PT to progress toward unmet goals.    Personal Factors and Comorbidities  Past/Current Experience;Comorbidity 3+    Comorbidities  spinal cord CVA 01/2019, diabetes mellitus, HTN, PCOS, vitamin D deficiency, sp right (2011) and left (2017) hip replacement, peripheral edema, venous insufficiency.    Examination-Activity Limitations  Sit;Squat;Stairs;Stand;Transfers;Locomotion Level    Examination-Participation Restrictions  Meal Prep;Driving;Community Activity    Stability/Clinical Decision Making  Evolving/Moderate complexity    Rehab Potential  Good    PT Frequency  2x / week    PT Duration  8 weeks    PT Treatment/Interventions  ADLs/Self Care Home Management;DME Instruction;Gait training;Stair training;Functional mobility training;Therapeutic activities;Therapeutic exercise;Balance training;Patient/family education;Neuromuscular re-education;Energy conservation    PT Next Visit Plan  Check HR during activity (sent MD an inbasket about HRs going up to 120 bpm after ambulating 1 lap).  Continued core and LE strengthening.  Gait training and endurance.  Stair training. Practice curb with SPC.    PT Home Exercise Plan  [id number] - plus seated hamstring stretch and seated forward flexion low back stretch    Consulted and Agree with Plan of Care  Patient       Patient will benefit from skilled therapeutic intervention in order to improve the following deficits and impairments:  Abnormal gait, Decreased activity tolerance, Decreased balance, Decreased  endurance, Decreased coordination, Decreased mobility, Decreased range of motion, Difficulty walking, Decreased strength, Impaired sensation  Visit Diagnosis: Difficulty in walking, not elsewhere classified  Muscle weakness (generalized)  Unsteadiness on feet  Other symptoms and signs involving the nervous system  Other disturbances of skin sensation     Problem List Patient Active Problem List   Diagnosis Date Noted  . Dysesthesia 03/29/2019  . Gait disturbance 03/29/2019  . Spinal cord infarction ([place]) 02/20/2019  . Cauda equina syndrome ([place]) 02/13/2019  . DM2 (diabetes mellitus, type 2) ([place]) 02/13/2019  . Syncope 02/13/2019  . Essential hypertension 02/13/2019  . Class 3 severe obesity with serious comorbidity and body mass index (BMI) of 45.0 to 49.9 in adult ([place]) 10/04/2017  . Class 3 severe obesity without serious comorbidity with body mass index (BMI) of 45.0 to 49.9 in adult ([place]) 04/12/2017  . Vitamin D deficiency 04/12/2017  . Other fatigue 12/31/2016  . SOB (shortness of breath) on exertion 12/31/2016  . Type 2 diabetes mellitus without complication, without long-term current use of insulin ([place]) 12/31/2016  . Morbid obesity ([place]) 12/31/2016  . OA (osteoarthritis) of hip 12/02/2015    Willow Ora, PTA, [facility] [address], [place], [place] [address] [phone] 06/06/19, 11:00 PM   Name: Laura Blackwell MRN: [id number] Date of Birth: [birth year]

## 2019-06-07 LAB — CBC
Hematocrit: 38.6 % (ref 34.0–46.6)
Hemoglobin: 12.9 g/dL (ref 11.1–15.9)
MCH: 27.2 pg (ref 26.6–33.0)
MCHC: 33.4 g/dL (ref 31.5–35.7)
MCV: 81 fL (ref 79–97)
Platelets: 275 10*3/uL (ref 150–450)
RBC: 4.74 x10E6/uL (ref 3.77–5.28)
RDW: 14.5 % (ref 11.7–15.4)
WBC: 8 10*3/uL (ref 3.4–10.8)

## 2019-06-07 LAB — TSH: TSH: 0.797 u[IU]/mL (ref 0.450–4.500)

## 2019-06-08 ENCOUNTER — Encounter: Payer: Self-pay | Admitting: Physical Therapy

## 2019-06-08 ENCOUNTER — Other Ambulatory Visit: Payer: Self-pay

## 2019-06-08 ENCOUNTER — Ambulatory Visit: Payer: 59 | Admitting: Physical Therapy

## 2019-06-08 DIAGNOSIS — R208 Other disturbances of skin sensation: Secondary | ICD-10-CM

## 2019-06-08 DIAGNOSIS — Z9181 History of falling: Secondary | ICD-10-CM

## 2019-06-08 DIAGNOSIS — R29818 Other symptoms and signs involving the nervous system: Secondary | ICD-10-CM

## 2019-06-08 DIAGNOSIS — R2681 Unsteadiness on feet: Secondary | ICD-10-CM

## 2019-06-08 DIAGNOSIS — M6281 Muscle weakness (generalized): Secondary | ICD-10-CM

## 2019-06-08 DIAGNOSIS — R262 Difficulty in walking, not elsewhere classified: Secondary | ICD-10-CM

## 2019-06-08 NOTE — Therapy (Signed)
[facility] [address] [place], [place], [address] Phone: [phone]   Fax:  [phone]  Physical Therapy Treatment  Patient Details  Name: Laura Blackwell MRN: [id number] Date of Birth: October 04, 1955 Referring Provider (PT): Carol Ada, MD   Encounter Date: 06/08/2019  PT End of Session - 06/08/19 1622    Visit Number  12    Number of Visits  17    Date for PT Re-Evaluation  06/25/19    Authorization Type  UHC    Authorization - Visit Number  12    Authorization - Number of Visits  60    PT Start Time  0845    PT Stop Time  0930    PT Time Calculation (min)  45 min    Equipment Utilized During Treatment  Gait belt    Activity Tolerance  Patient tolerated treatment well    Behavior During Therapy  [facility] for tasks assessed/performed       Past Medical History:  Diagnosis Date  . Arthritis   . Diabetes mellitus without complication ([place])   . Edema    in left leg in 1980s on left ankle   . Hyperlipidemia   . Hypertension   . PCOS (polycystic ovarian syndrome)   . Superficial thrombophlebitis   . Swelling   . Venous insufficiency   . Vitamin D deficiency     Past Surgical History:  Procedure Laterality Date  . BREAST BIOPSY Right   . JOINT REPLACEMENT  2011    right hip   . left ankle surgery     . TOTAL HIP ARTHROPLASTY Left 12/02/2015   Procedure: LEFT TOTAL HIP ARTHROPLASTY ANTERIOR APPROACH;  Surgeon: [name], MD;  Location: [facility];  Service: Orthopedics;  Laterality: Left;    There were no vitals filed for this visit.  Subjective Assessment - 06/08/19 0849    Subjective  Right leg still feeling a little weaker. Has not yet received the 7 day monitor. No falls. Has been doing her exercises and notices that her legs are getting stronger. Will be getting blood tests done for blood cell count and her thyoid levels checked.    Pertinent History  spinal cord CVA 01/2019, diabetes mellitus, HTN,  PCOS, vitamin D deficiency, sp right (2011) and left (2017) hip replacement, peripheral edema, venous insufficiency.    How long can you walk comfortably?  only household distances, states could not walk around track in therapy gym with cane    Diagnostic tests  MRI showed an abnormal focus in the conus medullaris and lower spinal cord adjacent to T12-L1.    Patient Stated Goals  "wants to be 99.99% better, wants to be the best she can be" - needs to strengthen thigh area    Currently in Pain?  No/denies    Pain Onset  More than a month ago                       [facility] Adult PT Treatment/Exercise - 06/08/19 0001      Ambulation/Gait   Ambulation/Gait  Yes    Ambulation/Gait Assistance  5: Supervision    Ambulation/Gait Assistance Details  150' with RW, needed to stop halfway through due to pt's HR elevating to approx. 125 bpm, able to decrease with standing rest breaks. Approx.. 100' throughout session with SPC, cues for heel >toe pattern.    Assistive device  Straight cane;Rolling walker    Gait Pattern  Step-to  pattern;Decreased arm swing - left;Decreased weight shift to left;Decreased hip/knee flexion - right;Decreased hip/knee flexion - left;Antalgic;Decreased trunk rotation;Trunk flexed;Wide base of support;Step-through pattern;Trendelenburg;Decreased stance time - left;Decreased step length - right;Lateral hip instability    Ambulation Surface  Level;Indoor      Therapeutic Activites    Other Therapeutic Activities  At stairs with single UE support with LUE on railing: 1 x 10 reps 6" step taps B, 1 x 10 reps B tapping second higher step (approx. 12") - seated rest breaks taken between each 10 reps due to elevated HR at 130 bpm. With RLE on 6" step and LUE support on railing 4 x 5 reps forward step ups, rest breaks in between each rep.       Neuro Re-ed    Neuro Re-ed Details   With 14" block for intermittent UE support - 1 x 10 reps mini squats, cues for glute activation,   8 reps B reaching outside of BOS towards therapist's hand with no UE support for lateral weight shifting, trunk elongation, and core activation.       Exercises   Other Exercises   After tall kneeling exercises on mat - HR elevated to 130 bpm - performed diaphragmatic breathing in supine position with HR dropping down to resting HR at 95-100 bpm at end of session.                PT Short Term Goals - 05/26/19 0820      PT SHORT TERM GOAL #1   Title  Patient will be independent with initial HEP with focus on strength and balance in order to build upon functional gains in therapy. ALL STGs DUE 05/24/19.    Time  4    Period  Weeks    Status  Partially Met    Target Date  05/24/19      PT SHORT TERM GOAL #2   Title  Patient will improve BERG score by at least 3 points to demonstrate decreased fall risk.    Baseline  44/56 on 04/25/19    Status  Achieved      PT SHORT TERM GOAL #3   Title  Patient will perform 4 steps with both handrails/single railing with step to pattern and supervision in order to safely enter/exit home.    Baseline  8 steps with both handrails - initially step through pattern ascending and descending, 2nd set of 4 did step to pattern to decrease stress and use of BUE on stairs.    Status  Achieved      PT SHORT TERM GOAL #4   Title  Patient will ambulate at least 10' with supervision using cane with step through gait pattern in order to improve house hold mobility.    Status  New      PT SHORT TERM GOAL #5   Title  Patient will decrease TUG score using LRAD to at least 18 seconds in order to decrease risk of falls.    Baseline  16.15 seconds on 05/26/19 with SPC, 17.81 seconds with RW    Status  Achieved      PT SHORT TERM GOAL #6   Title  Patient will undergo further testing of endurance with 3 vs. 6MWT. Goal will be written as appropriate.    Status  Achieved        PT Long Term Goals - 05/26/19 1612      PT LONG TERM GOAL #1   Title  Patient will  be  independent with final HEP with focus on strength and balance in order to build upon functional gains in therapy. ALL LTGs DUE 06/25/19.    Time  8    Period  Weeks    Status  New      PT LONG TERM GOAL #2   Title  Patient will improve BERG score by at least 7 points to demonstrate decreased fall risk.    Baseline  initial = 39/56    Status  New      PT LONG TERM GOAL #3   Title  Patient will decrease TUG score using LRAD to at least 15 seconds in order to decrease risk of falls.    Status  New      PT LONG TERM GOAL #4   Title  Patient will perform 14 steps with single railing with step to pattern and supervision in order to safely ascend/descend stairs in her home.    Baseline  not yet assessed.    Status  New      PT LONG TERM GOAL #5   Title  Patient will ambulate at least 350' with supervision over indoor level/outdoor unlevel surfaces using cane with step through gait pattern in order to improve community mobility.    Status  New      PT LONG TERM GOAL #6   Title  Patient will at least 68' with RW or LRAD during 3MWT to demonstrate improved walking endurance.    Baseline  266' with RW - 3MWT.    Status  New      PT LONG TERM GOAL #7   Title  Patient will improve gait speed to at least 2.2 ft/sec using LRAD in order to decrease risk of falls.    Baseline  1.41 ft/sec    Status  New            Plan - 06/08/19 1625    Clinical Impression Statement  Focus of today's session was gait training and LE strengthening. Pt continues to demonstrate proximal LE weakness and needs LUE support on railing in order to perform forward step ups with RLE. Rest breaks taken throughout session and R monitored throughout. After step ups/tall kneeling exercises pt with elevated HR to 130 bpm, able to decrease with prolonged seated rest break before continuing. At end of session HR able to decrease back to resting levels with supine diaphragmatic breathing. Will continue to progress towards  LTGs.    Personal Factors and Comorbidities  Past/Current Experience;Comorbidity 3+    Comorbidities  spinal cord CVA 01/2019, diabetes mellitus, HTN, PCOS, vitamin D deficiency, sp right (2011) and left (2017) hip replacement, peripheral edema, venous insufficiency.    Examination-Activity Limitations  Sit;Squat;Stairs;Stand;Transfers;Locomotion Level    Examination-Participation Restrictions  Meal Prep;Driving;Community Activity    Stability/Clinical Decision Making  Evolving/Moderate complexity    Rehab Potential  Good    PT Frequency  2x / week    PT Duration  8 weeks    PT Treatment/Interventions  ADLs/Self Care Home Management;DME Instruction;Gait training;Stair training;Functional mobility training;Therapeutic activities;Therapeutic exercise;Balance training;Patient/family education;Neuromuscular re-education;Energy conservation    PT Next Visit Plan  Check HR during activity. Continue tall kneeling for proximal hip strengthening. Continued core and LE strengthening.  Gait training and endurance.  Stair training/step ups. Practice curb with SPC.    PT Home Exercise Plan  [id number] - plus seated hamstring stretch and seated forward flexion low back stretch    Consulted and Agree with Plan of Care  Patient  Patient will benefit from skilled therapeutic intervention in order to improve the following deficits and impairments:  Abnormal gait, Decreased activity tolerance, Decreased balance, Decreased endurance, Decreased coordination, Decreased mobility, Decreased range of motion, Difficulty walking, Decreased strength, Impaired sensation  Visit Diagnosis: Difficulty in walking, not elsewhere classified  Muscle weakness (generalized)  Unsteadiness on feet  Other symptoms and signs involving the nervous system  History of falling  Other disturbances of skin sensation     Problem List Patient Active Problem List   Diagnosis Date Noted  . Dysesthesia 03/29/2019  . Gait  disturbance 03/29/2019  . Spinal cord infarction ([place]) 02/20/2019  . Cauda equina syndrome ([place]) 02/13/2019  . DM2 (diabetes mellitus, type 2) ([place]) 02/13/2019  . Syncope 02/13/2019  . Essential hypertension 02/13/2019  . Class 3 severe obesity with serious comorbidity and body mass index (BMI) of 45.0 to 49.9 in adult ([place]) 10/04/2017  . Class 3 severe obesity without serious comorbidity with body mass index (BMI) of 45.0 to 49.9 in adult ([place]) 04/12/2017  . Vitamin D deficiency 04/12/2017  . Other fatigue 12/31/2016  . SOB (shortness of breath) on exertion 12/31/2016  . Type 2 diabetes mellitus without complication, without long-term current use of insulin ([place]) 12/31/2016  . Morbid obesity ([place]) 12/31/2016  . OA (osteoarthritis) of hip 12/02/2015    Arliss Journey, PT, DPT  06/08/2019, 4:26 PM  [facility] [address] [place], [place], [address] Phone: [phone]   Fax:  [phone]  Name: Laura Blackwell MRN: [id number] Date of Birth: 02-11-1956

## 2019-06-11 ENCOUNTER — Ambulatory Visit (INDEPENDENT_AMBULATORY_CARE_PROVIDER_SITE_OTHER): Payer: 59

## 2019-06-11 DIAGNOSIS — R002 Palpitations: Secondary | ICD-10-CM | POA: Diagnosis not present

## 2019-06-13 ENCOUNTER — Other Ambulatory Visit: Payer: Self-pay

## 2019-06-13 ENCOUNTER — Encounter: Payer: Self-pay | Admitting: Physical Therapy

## 2019-06-13 ENCOUNTER — Ambulatory Visit: Payer: 59 | Admitting: Physical Therapy

## 2019-06-13 DIAGNOSIS — R2681 Unsteadiness on feet: Secondary | ICD-10-CM

## 2019-06-13 DIAGNOSIS — R262 Difficulty in walking, not elsewhere classified: Secondary | ICD-10-CM | POA: Diagnosis not present

## 2019-06-13 DIAGNOSIS — R29818 Other symptoms and signs involving the nervous system: Secondary | ICD-10-CM

## 2019-06-13 DIAGNOSIS — M6281 Muscle weakness (generalized): Secondary | ICD-10-CM

## 2019-06-13 NOTE — Therapy (Signed)
[facility] [address] [place], [place], [address] Phone: [phone]   Fax:  [phone]  Physical Therapy Treatment  Patient Details  Name: Laura Blackwell MRN: [id number] Date of Birth: 02-21-1956 Referring Provider (PT): Carol Ada, MD   Encounter Date: 06/13/2019  PT End of Session - 06/13/19 0850    Visit Number  13    Number of Visits  17    Date for PT Re-Evaluation  06/25/19    Authorization Type  [employer]    Authorization - Visit Number  13    Authorization - Number of Visits  60    PT Start Time  0848    PT Stop Time  0928    PT Time Calculation (min)  40 min    Equipment Utilized During Treatment  Gait belt    Activity Tolerance  Patient tolerated treatment well;Patient limited by fatigue    Behavior During Therapy  [facility] for tasks assessed/performed       Past Medical History:  Diagnosis Date  . Arthritis   . Diabetes mellitus without complication ([place])   . Edema    in left leg in 1980s on left ankle   . Hyperlipidemia   . Hypertension   . PCOS (polycystic ovarian syndrome)   . Superficial thrombophlebitis   . Swelling   . Venous insufficiency   . Vitamin D deficiency     Past Surgical History:  Procedure Laterality Date  . BREAST BIOPSY Right   . JOINT REPLACEMENT  2011    right hip   . left ankle surgery     . TOTAL HIP ARTHROPLASTY Left 12/02/2015   Procedure: LEFT TOTAL HIP ARTHROPLASTY ANTERIOR APPROACH;  Surgeon: [name], MD;  Location: [facility];  Service: Orthopedics;  Laterality: Left;    There were no vitals filed for this visit.  Subjective Assessment - 06/13/19 0850    Subjective  No falls or pain to report. No new complaints. Has heart monitor on. Put it on Sunday and wears it for 7 days. Blood work came back with no issues.    Pertinent History  spinal cord CVA 01/2019, diabetes mellitus, HTN, PCOS, vitamin D deficiency, sp right (2011) and left (2017) hip replacement,  peripheral edema, venous insufficiency.    How long can you walk comfortably?  only household distances, states could not walk around track in therapy gym with cane    Diagnostic tests  MRI showed an abnormal focus in the conus medullaris and lower spinal cord adjacent to T12-L1.    Patient Stated Goals  "wants to be 99.99% better, wants to be the best she can be" - needs to strengthen thigh area    Pain Score  0-No pain                       [facility] Adult PT Treatment/Exercise - 06/13/19 0852      Transfers   Transfers  Sit to Stand;Stand to Sit    Sit to Stand  5: Supervision;With upper extremity assist;From chair/3-in-1    Stand to Sit  5: Supervision;With upper extremity assist;To chair/3-in-1      Ambulation/Gait   Ambulation/Gait  --    Ambulation/Gait Assistance  5: Supervision    Ambulation Distance (Feet)  --   around gym with activity   Assistive device  Straight cane    Gait Pattern  Step-through pattern;Decreased stride length;Decreased stance time - left;Decreased step length - right;Decreased weight  shift to left;Decreased trunk rotation;Narrow base of support;Lateral hip instability;Decreased arm swing - right;Decreased arm swing - left    Ambulation Surface  Level;Indoor      Neuro Re-ed    Neuro Re-ed Details   for balance/muscle re-ed: in tall kneeling on blue mat with UE support on blue bench- mini squats for 10 reps with emphasis on hip flexion/return to full tall kneeling; then moving LE fwd/bwd for 10 reps each side. min guard assist for balance. HR up to 130 bpm with tall kneeling activities. Decreased to 106 bpm with seated rest for 1-2 minutes.       Lumbar Exercises: Aerobic   Other Aerobic Exercise  HR 100 before Scifit. level 2.0 for 5 minutes with LE's only with goal >/= 65 rpm for strengthening and activity tolerance. HR 112 bpm after session.       Lumbar Exercises: Machines for Strengthening   Leg Press  60# bil LE's for 2 sets of 10 reps.  Cues for slow, controlled movements. HR 110 bpm afterwards.     Knee/Hip Exercises: Standing   Other Standing Knee Exercises  in parallel bars: red band around legs below knees for side stepping left<>right for 3 laps each way; with band above knees- in squat position side stepping left<>right for 2 laps each way with HR 146 bpm after 2cd lap. seated rest with recovery within 1-2 minutes. then forward facing in squat position diagonal stepping for 2 laps forward. HR 143 afterwards, seated rest for 1-2 minutes with HR down to           PT Short Term Goals - 05/26/19 0820      PT SHORT TERM GOAL #1   Title  Patient will be independent with initial HEP with focus on strength and balance in order to build upon functional gains in therapy. ALL STGs DUE 05/24/19.    Time  4    Period  Weeks    Status  Partially Met    Target Date  05/24/19      PT SHORT TERM GOAL #2   Title  Patient will improve BERG score by at least 3 points to demonstrate decreased fall risk.    Baseline  44/56 on 04/25/19    Status  Achieved      PT SHORT TERM GOAL #3   Title  Patient will perform 4 steps with both handrails/single railing with step to pattern and supervision in order to safely enter/exit home.    Baseline  8 steps with both handrails - initially step through pattern ascending and descending, 2nd set of 4 did step to pattern to decrease stress and use of BUE on stairs.    Status  Achieved      PT SHORT TERM GOAL #4   Title  Patient will ambulate at least 7' with supervision using cane with step through gait pattern in order to improve house hold mobility.    Status  New      PT SHORT TERM GOAL #5   Title  Patient will decrease TUG score using LRAD to at least 18 seconds in order to decrease risk of falls.    Baseline  16.15 seconds on 05/26/19 with SPC, 17.81 seconds with RW    Status  Achieved      PT SHORT TERM GOAL #6   Title  Patient will undergo further testing of endurance with 3 vs. 6MWT.  Goal will be written as appropriate.    Status  Achieved  PT Long Term Goals - 05/26/19 1612      PT LONG TERM GOAL #1   Title  Patient will be independent with final HEP with focus on strength and balance in order to build upon functional gains in therapy. ALL LTGs DUE 06/25/19.    Time  8    Period  Weeks    Status  New      PT LONG TERM GOAL #2   Title  Patient will improve BERG score by at least 7 points to demonstrate decreased fall risk.    Baseline  initial = 39/56    Status  New      PT LONG TERM GOAL #3   Title  Patient will decrease TUG score using LRAD to at least 15 seconds in order to decrease risk of falls.    Status  New      PT LONG TERM GOAL #4   Title  Patient will perform 14 steps with single railing with step to pattern and supervision in order to safely ascend/descend stairs in her home.    Baseline  not yet assessed.    Status  New      PT LONG TERM GOAL #5   Title  Patient will ambulate at least 350' with supervision over indoor level/outdoor unlevel surfaces using cane with step through gait pattern in order to improve community mobility.    Status  New      PT LONG TERM GOAL #6   Title  Patient will at least 30' with RW or LRAD during 3MWT to demonstrate improved walking endurance.    Baseline  266' with RW - 3MWT.    Status  New      PT LONG TERM GOAL #7   Title  Patient will improve gait speed to at least 2.2 ft/sec using LRAD in order to decrease risk of falls.    Baseline  1.41 ft/sec    Status  New            Plan - 06/13/19 0850    Clinical Impression Statement  Today's skilled session continued to focus on LE strengthening with rest breaks needed due to increased HR with activity. No other issues reported or noted with session. HR did recover each time with rest break of 1-2 minutes. The pt is progressing toward goals and should benefit from continued PT to progress toward unmet goals.    Personal Factors and Comorbidities   Past/Current Experience;Comorbidity 3+    Comorbidities  spinal cord CVA 01/2019, diabetes mellitus, HTN, PCOS, vitamin D deficiency, sp right (2011) and left (2017) hip replacement, peripheral edema, venous insufficiency.    Examination-Activity Limitations  Sit;Squat;Stairs;Stand;Transfers;Locomotion Level    Examination-Participation Restrictions  Meal Prep;Driving;Community Activity    Stability/Clinical Decision Making  Evolving/Moderate complexity    Rehab Potential  Good    PT Frequency  2x / week    PT Duration  8 weeks    PT Treatment/Interventions  ADLs/Self Care Home Management;DME Instruction;Gait training;Stair training;Functional mobility training;Therapeutic activities;Therapeutic exercise;Balance training;Patient/family education;Neuromuscular re-education;Energy conservation    PT Next Visit Plan  Check HR during activity. Continue tall kneeling for proximal hip strengthening. Continued core and LE strengthening.  Gait training and endurance.  Stair training/step ups. Practice curb with SPC.    PT Home Exercise Plan  [id number] - plus seated hamstring stretch and seated forward flexion low back stretch    Consulted and Agree with Plan of Care  Patient  Patient will benefit from skilled therapeutic intervention in order to improve the following deficits and impairments:  Abnormal gait, Decreased activity tolerance, Decreased balance, Decreased endurance, Decreased coordination, Decreased mobility, Decreased range of motion, Difficulty walking, Decreased strength, Impaired sensation  Visit Diagnosis: Difficulty in walking, not elsewhere classified  Muscle weakness (generalized)  Unsteadiness on feet  Other symptoms and signs involving the nervous system     Problem List Patient Active Problem List   Diagnosis Date Noted  . Dysesthesia 03/29/2019  . Gait disturbance 03/29/2019  . Spinal cord infarction ([place]) 02/20/2019  . Cauda equina syndrome ([place]) 02/13/2019  .  DM2 (diabetes mellitus, type 2) ([place]) 02/13/2019  . Syncope 02/13/2019  . Essential hypertension 02/13/2019  . Class 3 severe obesity with serious comorbidity and body mass index (BMI) of 45.0 to 49.9 in adult ([place]) 10/04/2017  . Class 3 severe obesity without serious comorbidity with body mass index (BMI) of 45.0 to 49.9 in adult ([place]) 04/12/2017  . Vitamin D deficiency 04/12/2017  . Other fatigue 12/31/2016  . SOB (shortness of breath) on exertion 12/31/2016  . Type 2 diabetes mellitus without complication, without long-term current use of insulin ([place]) 12/31/2016  . Morbid obesity ([place]) 12/31/2016  . OA (osteoarthritis) of hip 12/02/2015    Willow Ora, PTA, [facility] [address] [place], [place] [address] [phone] 06/13/19, 11:47 AM  Name: Laura Blackwell MRN: [id number] Date of Birth: June 21, 1956

## 2019-06-15 ENCOUNTER — Encounter: Payer: Self-pay | Admitting: Physical Therapy

## 2019-06-15 ENCOUNTER — Ambulatory Visit: Payer: 59 | Admitting: Physical Therapy

## 2019-06-15 ENCOUNTER — Other Ambulatory Visit: Payer: Self-pay

## 2019-06-15 DIAGNOSIS — M6281 Muscle weakness (generalized): Secondary | ICD-10-CM

## 2019-06-15 DIAGNOSIS — Z9181 History of falling: Secondary | ICD-10-CM

## 2019-06-15 DIAGNOSIS — R29818 Other symptoms and signs involving the nervous system: Secondary | ICD-10-CM

## 2019-06-15 DIAGNOSIS — R262 Difficulty in walking, not elsewhere classified: Secondary | ICD-10-CM | POA: Diagnosis not present

## 2019-06-15 DIAGNOSIS — R2681 Unsteadiness on feet: Secondary | ICD-10-CM

## 2019-06-15 NOTE — Therapy (Signed)
[facility] [address] [place], [place], [address] Phone: [phone]   Fax:  [phone]  Physical Therapy Treatment  Patient Details  Name: Laura Blackwell MRN: [id number] Date of Birth: October 23, 1955 Referring Provider (PT): Carol Ada, MD   Encounter Date: 06/15/2019  PT End of Session - 06/15/19 0855    Visit Number  14    Number of Visits  17    Date for PT Re-Evaluation  06/25/19    Authorization Type  UHC    Authorization - Visit Number  14    Authorization - Number of Visits  60    PT Start Time  4680    PT Stop Time  0931    PT Time Calculation (min)  44 min    Equipment Utilized During Treatment  Gait belt    Activity Tolerance  Patient tolerated treatment well;Patient limited by fatigue    Behavior During Therapy  [facility] for tasks assessed/performed       Past Medical History:  Diagnosis Date  . Arthritis   . Diabetes mellitus without complication ([place])   . Edema    in left leg in 1980s on left ankle   . Hyperlipidemia   . Hypertension   . PCOS (polycystic ovarian syndrome)   . Superficial thrombophlebitis   . Swelling   . Venous insufficiency   . Vitamin D deficiency     Past Surgical History:  Procedure Laterality Date  . BREAST BIOPSY Right   . JOINT REPLACEMENT  2011    right hip   . left ankle surgery     . TOTAL HIP ARTHROPLASTY Left 12/02/2015   Procedure: LEFT TOTAL HIP ARTHROPLASTY ANTERIOR APPROACH;  Surgeon: [name], MD;  Location: [facility];  Service: Orthopedics;  Laterality: Left;    There were no vitals filed for this visit.  Subjective Assessment - 06/15/19 0849    Subjective  No falls. No new complaints. Felt good after last session. Heart monitor is on until Sunday.    Pertinent History  spinal cord CVA 01/2019, diabetes mellitus, HTN, PCOS, vitamin D deficiency, sp right (2011) and left (2017) hip replacement, peripheral edema, venous insufficiency.    How long  can you walk comfortably?  only household distances, states could not walk around track in therapy gym with cane    Diagnostic tests  MRI showed an abnormal focus in the conus medullaris and lower spinal cord adjacent to T12-L1.    Patient Stated Goals  "wants to be 99.99% better, wants to be the best she can be" - needs to strengthen thigh area    Currently in Pain?  No/denies                       [facility] Adult PT Treatment/Exercise - 06/15/19 0902      Ambulation/Gait   Ambulation/Gait  Yes    Ambulation/Gait Assistance  5: Supervision    Ambulation/Gait Assistance Details  1 x 115' plus additional clinic distances with SPC, pt continues to have antalgic pattern with HR increasing to 130 bpm after 1 lap around therapy gym    Ambulation Distance (Feet)  150 Feet    Assistive device  Straight cane    Gait Pattern  Step-through pattern;Decreased stride length;Decreased stance time - left;Decreased step length - right;Decreased weight shift to left;Decreased trunk rotation;Narrow base of support;Lateral hip instability;Decreased arm swing - right;Decreased arm swing - left    Ambulation Surface  Level;Indoor  Therapeutic Activites    Other Therapeutic Activities  Standing in // bars with BUE support onto 4" step, standing on stance leg hip ABD dips, with cues for hip ABD activation and tactile cueing from therapist to prevent pelvic drop; 4 x 5 reps B, standing rest breaks taken in between after HR elevated to 135 bpm. Forward step ups onto 4" step with BUE support 1 x 10 reps B. On 2" step with single UE support forward step ups 1 x 10 reps.       Exercises   Other Exercises   HR 98 before Scifit. level 2.0 for 6 minutes with LE's only with goal >/= 65 rpm for LE strengthening and activity tolerance. HR 105 bpm after session.            Balance Exercises - 06/15/19 0940      Balance Exercises: Standing   Standing Eyes Closed  Wide (BOA);Foam/compliant surface;4  reps;30 secs    Other Standing Exercises  in // bars: SLS on LLE with RLE on 4" step without any UE support, 4 x 20-30 second reps           PT Short Term Goals - 05/26/19 0820      PT SHORT TERM GOAL #1   Title  Patient will be independent with initial HEP with focus on strength and balance in order to build upon functional gains in therapy. ALL STGs DUE 05/24/19.    Time  4    Period  Weeks    Status  Partially Met    Target Date  05/24/19      PT SHORT TERM GOAL #2   Title  Patient will improve BERG score by at least 3 points to demonstrate decreased fall risk.    Baseline  44/56 on 04/25/19    Status  Achieved      PT SHORT TERM GOAL #3   Title  Patient will perform 4 steps with both handrails/single railing with step to pattern and supervision in order to safely enter/exit home.    Baseline  8 steps with both handrails - initially step through pattern ascending and descending, 2nd set of 4 did step to pattern to decrease stress and use of BUE on stairs.    Status  Achieved      PT SHORT TERM GOAL #4   Title  Patient will ambulate at least 52' with supervision using cane with step through gait pattern in order to improve house hold mobility.    Status  New      PT SHORT TERM GOAL #5   Title  Patient will decrease TUG score using LRAD to at least 18 seconds in order to decrease risk of falls.    Baseline  16.15 seconds on 05/26/19 with SPC, 17.81 seconds with RW    Status  Achieved      PT SHORT TERM GOAL #6   Title  Patient will undergo further testing of endurance with 3 vs. 6MWT. Goal will be written as appropriate.    Status  Achieved        PT Long Term Goals - 05/26/19 1612      PT LONG TERM GOAL #1   Title  Patient will be independent with final HEP with focus on strength and balance in order to build upon functional gains in therapy. ALL LTGs DUE 06/25/19.    Time  8    Period  Weeks    Status  New  PT LONG TERM GOAL #2   Title  Patient will improve  BERG score by at least 7 points to demonstrate decreased fall risk.    Baseline  initial = 39/56    Status  New      PT LONG TERM GOAL #3   Title  Patient will decrease TUG score using LRAD to at least 15 seconds in order to decrease risk of falls.    Status  New      PT LONG TERM GOAL #4   Title  Patient will perform 14 steps with single railing with step to pattern and supervision in order to safely ascend/descend stairs in her home.    Baseline  not yet assessed.    Status  New      PT LONG TERM GOAL #5   Title  Patient will ambulate at least 350' with supervision over indoor level/outdoor unlevel surfaces using cane with step through gait pattern in order to improve community mobility.    Status  New      PT LONG TERM GOAL #6   Title  Patient will at least 62' with RW or LRAD during 3MWT to demonstrate improved walking endurance.    Baseline  266' with RW - 3MWT.    Status  New      PT LONG TERM GOAL #7   Title  Patient will improve gait speed to at least 2.2 ft/sec using LRAD in order to decrease risk of falls.    Baseline  1.41 ft/sec    Status  New            Plan - 06/15/19 0943    Clinical Impression Statement  Focus of today's session was LE strengthening and ankle balance strategies in SLS and on compliant surfaces. Pt with increased difficulty with SLS on L due to decreased sensation and numbess. Pt continues to demonstrate proximal hip weakness, especially hip ABD on RLE stance leg.  Seated and standing rest breaks taken throughout session due to elevated HR. Pt is very motivated and will continue to benefit from skilled PT in order to progress towards LTGs.    Personal Factors and Comorbidities  Past/Current Experience;Comorbidity 3+    Comorbidities  spinal cord CVA 01/2019, diabetes mellitus, HTN, PCOS, vitamin D deficiency, sp right (2011) and left (2017) hip replacement, peripheral edema, venous insufficiency.    Examination-Activity Limitations   Sit;Squat;Stairs;Stand;Transfers;Locomotion Level    Examination-Participation Restrictions  Meal Prep;Driving;Community Activity    Stability/Clinical Decision Making  Evolving/Moderate complexity    Rehab Potential  Good    PT Frequency  2x / week    PT Duration  8 weeks    PT Treatment/Interventions  ADLs/Self Care Home Management;DME Instruction;Gait training;Stair training;Functional mobility training;Therapeutic activities;Therapeutic exercise;Balance training;Patient/family education;Neuromuscular re-education;Energy conservation    PT Next Visit Plan  Check HR during activity. Review/upgrade HEP (add exercises for hip ABD strengthening). Continued core and LE strengthening(especially hip ABD)  Gait training and endurance. Step ups. Balance with focus on ankle strategy. Practice curb with SPC.    PT Home Exercise Plan  [id number] - plus seated hamstring stretch and seated forward flexion low back stretch    Consulted and Agree with Plan of Care  Patient       Patient will benefit from skilled therapeutic intervention in order to improve the following deficits and impairments:  Abnormal gait, Decreased activity tolerance, Decreased balance, Decreased endurance, Decreased coordination, Decreased mobility, Decreased range of motion, Difficulty walking, Decreased strength, Impaired sensation  Visit Diagnosis: Muscle weakness (generalized)  Difficulty in walking, not elsewhere classified  Unsteadiness on feet  Other symptoms and signs involving the nervous system  History of falling     Problem List Patient Active Problem List   Diagnosis Date Noted  . Dysesthesia 03/29/2019  . Gait disturbance 03/29/2019  . Spinal cord infarction ([place]) 02/20/2019  . Cauda equina syndrome ([place]) 02/13/2019  . DM2 (diabetes mellitus, type 2) ([place]) 02/13/2019  . Syncope 02/13/2019  . Essential hypertension 02/13/2019  . Class 3 severe obesity with serious comorbidity and body mass index (BMI) of  45.0 to 49.9 in adult ([place]) 10/04/2017  . Class 3 severe obesity without serious comorbidity with body mass index (BMI) of 45.0 to 49.9 in adult ([place]) 04/12/2017  . Vitamin D deficiency 04/12/2017  . Other fatigue 12/31/2016  . SOB (shortness of breath) on exertion 12/31/2016  . Type 2 diabetes mellitus without complication, without long-term current use of insulin ([place]) 12/31/2016  . Morbid obesity ([place]) 12/31/2016  . OA (osteoarthritis) of hip 12/02/2015    Arliss Journey, PT, DPT  06/15/2019, 9:52 AM  [facility] [address] [place], [place], [address] Phone: [phone]   Fax:  [phone]  Name: Laura Blackwell MRN: [id number] Date of Birth: [birth year]

## 2019-06-20 ENCOUNTER — Other Ambulatory Visit: Payer: Self-pay

## 2019-06-20 ENCOUNTER — Ambulatory Visit: Payer: 59 | Admitting: Physical Therapy

## 2019-06-20 ENCOUNTER — Encounter: Payer: Self-pay | Admitting: Physical Therapy

## 2019-06-20 DIAGNOSIS — M6281 Muscle weakness (generalized): Secondary | ICD-10-CM

## 2019-06-20 DIAGNOSIS — R262 Difficulty in walking, not elsewhere classified: Secondary | ICD-10-CM

## 2019-06-20 DIAGNOSIS — R2681 Unsteadiness on feet: Secondary | ICD-10-CM

## 2019-06-21 NOTE — Therapy (Signed)
[facility] [address] [place], [place], [address] Phone: [phone]   Fax:  [phone]  Physical Therapy Treatment  Patient Details  Name: Laura Blackwell MRN: [id number] Date of Birth: 1956-04-08 Referring Provider (PT): Carol Ada, MD   Encounter Date: 06/20/2019  PT End of Session - 06/21/19 1312    Visit Number  15    Number of Visits  17    Date for PT Re-Evaluation  06/25/19    Authorization Type  [employer]    Authorization - Visit Number  15    Authorization - Number of Visits  60    PT Start Time  0804    PT Stop Time  0845    PT Time Calculation (min)  41 min    Equipment Utilized During Treatment  Gait belt    Activity Tolerance  Patient tolerated treatment well;Patient limited by fatigue   HR 125-134 with activity, needs seated rest breaks   Behavior During Therapy  [facility] for tasks assessed/performed       Past Medical History:  Diagnosis Date  . Arthritis   . Diabetes mellitus without complication ([place])   . Edema    in left leg in 1980s on left ankle   . Hyperlipidemia   . Hypertension   . PCOS (polycystic ovarian syndrome)   . Superficial thrombophlebitis   . Swelling   . Venous insufficiency   . Vitamin D deficiency     Past Surgical History:  Procedure Laterality Date  . BREAST BIOPSY Right   . JOINT REPLACEMENT  2011    right hip   . left ankle surgery     . TOTAL HIP ARTHROPLASTY Left 12/02/2015   Procedure: LEFT TOTAL HIP ARTHROPLASTY ANTERIOR APPROACH;  Surgeon: [name], MD;  Location: [facility];  Service: Orthopedics;  Laterality: Left;    There were no vitals filed for this visit.  Subjective Assessment - 06/20/19 0806    Subjective  My legs feel so strange-cold, from my thighs all the way down; almost like a burning.  I always feel like I've worked when Shahrukh Pasch in therapy.    Pertinent History  spinal cord CVA 01/2019, diabetes mellitus, HTN, PCOS, vitamin D deficiency, sp  right (2011) and left (2017) hip replacement, peripheral edema, venous insufficiency.    How long can you walk comfortably?  only household distances, states could not walk around track in therapy gym with cane    Diagnostic tests  MRI showed an abnormal focus in the conus medullaris and lower spinal cord adjacent to T12-L1.    Patient Stated Goals  "wants to be 99.99% better, wants to be the best she can be" - needs to strengthen thigh area    Currently in Pain?  No/denies                       [facility] Adult PT Treatment/Exercise - 06/21/19 1257      Transfers   Transfers  Sit to Stand;Stand to Sit    Sit to Stand  5: Supervision;With upper extremity assist;From chair/3-in-1    Stand to Sit  5: Supervision;With upper extremity assist;To chair/3-in-1    Number of Reps  --   At least 5 reps throughout session, minimal UE support     Ambulation/Gait   Ambulation/Gait  Yes    Ambulation/Gait Assistance  5: Supervision    Ambulation/Gait Assistance Details  Cues for abdominal activation with gait for improved hip/core  stability with gait.    Ambulation Distance (Feet)  60 Feet   x 2; 115 ft   Assistive device  Straight cane    Gait Pattern  Step-through pattern;Decreased stride length;Decreased stance time - left;Decreased step length - right;Decreased weight shift to left;Decreased trunk rotation;Narrow base of support;Lateral hip instability;Decreased arm swing - right;Decreased arm swing - left    Ambulation Surface  Level;Indoor      High Level Balance   High Level Balance Comments  Sidestepping>forward/back stepping in parallel bars, with cues for increased step length/SLS      Knee/Hip Exercises: Standing   Lateral Step Up  Right;Left;1 set;15 reps;Hand Hold: 2;Step Height: 2";Step Height: 4"    Lateral Step Up Limitations  Cues to tuck in hip for glut activation    Forward Step Up  Right;Left;1 set;15 reps;Hand Hold: 2;Step Height: 2";Step Height: 4"    Forward Step  Up Limitations  Cues to tuck in hip for glut activation.    Other Standing Knee Exercises  Sidestep/together R and L, 2 sets x 10 reps; seated rest break between, with HR 125 bpm    Other Standing Knee Exercises  Resisted sidestepping with green theraband around thighs, 3 reps in parallel bars R and L, progress to sidestep squats with green theraband 2 reps in bars R and L (seated rest break after, with HR 134 bpm); sidestep onto and off blue [employer] disk, R and L, 10 reps each with UE support               PT Short Term Goals - 05/26/19 0820      PT SHORT TERM GOAL #1   Title  Patient will be independent with initial HEP with focus on strength and balance in order to build upon functional gains in therapy. ALL STGs DUE 05/24/19.    Time  4    Period  Weeks    Status  Partially Met    Target Date  05/24/19      PT SHORT TERM GOAL #2   Title  Patient will improve BERG score by at least 3 points to demonstrate decreased fall risk.    Baseline  44/56 on 04/25/19    Status  Achieved      PT SHORT TERM GOAL #3   Title  Patient will perform 4 steps with both handrails/single railing with step to pattern and supervision in order to safely enter/exit home.    Baseline  8 steps with both handrails - initially step through pattern ascending and descending, 2nd set of 4 did step to pattern to decrease stress and use of BUE on stairs.    Status  Achieved      PT SHORT TERM GOAL #4   Title  Patient will ambulate at least 27' with supervision using cane with step through gait pattern in order to improve house hold mobility.    Status  New      PT SHORT TERM GOAL #5   Title  Patient will decrease TUG score using LRAD to at least 18 seconds in order to decrease risk of falls.    Baseline  16.15 seconds on 05/26/19 with SPC, 17.81 seconds with RW    Status  Achieved      PT SHORT TERM GOAL #6   Title  Patient will undergo further testing of endurance with 3 vs. 6MWT. Goal will be written as  appropriate.    Status  Achieved  PT Long Term Goals - 05/26/19 1612      PT LONG TERM GOAL #1   Title  Patient will be independent with final HEP with focus on strength and balance in order to build upon functional gains in therapy. ALL LTGs DUE 06/25/19.    Time  8    Period  Weeks    Status  New      PT LONG TERM GOAL #2   Title  Patient will improve BERG score by at least 7 points to demonstrate decreased fall risk.    Baseline  initial = 39/56    Status  New      PT LONG TERM GOAL #3   Title  Patient will decrease TUG score using LRAD to at least 15 seconds in order to decrease risk of falls.    Status  New      PT LONG TERM GOAL #4   Title  Patient will perform 14 steps with single railing with step to pattern and supervision in order to safely ascend/descend stairs in her home.    Baseline  not yet assessed.    Status  New      PT LONG TERM GOAL #5   Title  Patient will ambulate at least 350' with supervision over indoor level/outdoor unlevel surfaces using cane with step through gait pattern in order to improve community mobility.    Status  New      PT LONG TERM GOAL #6   Title  Patient will at least 82' with RW or LRAD during 3MWT to demonstrate improved walking endurance.    Baseline  266' with RW - 3MWT.    Status  New      PT LONG TERM GOAL #7   Title  Patient will improve gait speed to at least 2.2 ft/sec using LRAD in order to decrease risk of falls.    Baseline  1.41 ft/sec    Status  New            Plan - 06/21/19 1313    Clinical Impression Statement  Continued focused hip and lower extremity strengthening in parallel bars, for improved stability with gait.  Pt continues to use UE support as needed for SLS and strengthening exercises.  Seated and standing rest breaks taken due to elevated HR.  Will assess LTGs next visit to determine further PT POC.    Personal Factors and Comorbidities  Past/Current Experience;Comorbidity 3+     Comorbidities  spinal cord CVA 01/2019, diabetes mellitus, HTN, PCOS, vitamin D deficiency, sp right (2011) and left (2017) hip replacement, peripheral edema, venous insufficiency.    Examination-Activity Limitations  Sit;Squat;Stairs;Stand;Transfers;Locomotion Level    Examination-Participation Restrictions  Meal Prep;Driving;Community Activity    Stability/Clinical Decision Making  Evolving/Moderate complexity    Rehab Potential  Good    PT Frequency  2x / week    PT Duration  8 weeks    PT Treatment/Interventions  ADLs/Self Care Home Management;DME Instruction;Gait training;Stair training;Functional mobility training;Therapeutic activities;Therapeutic exercise;Balance training;Patient/family education;Neuromuscular re-education;Energy conservation    PT Next Visit Plan  Check LTGs; need to discuss POC, as this week is week 8 of 8; check HR during activitiy.    PT Home Exercise Plan  [id number] - plus seated hamstring stretch and seated forward flexion low back stretch    Consulted and Agree with Plan of Care  Patient       Patient will benefit from skilled therapeutic intervention in order to improve the following deficits and  impairments:  Abnormal gait, Decreased activity tolerance, Decreased balance, Decreased endurance, Decreased coordination, Decreased mobility, Decreased range of motion, Difficulty walking, Decreased strength, Impaired sensation  Visit Diagnosis: Muscle weakness (generalized)  Difficulty in walking, not elsewhere classified  Unsteadiness on feet     Problem List Patient Active Problem List   Diagnosis Date Noted  . Dysesthesia 03/29/2019  . Gait disturbance 03/29/2019  . Spinal cord infarction ([place]) 02/20/2019  . Cauda equina syndrome ([place]) 02/13/2019  . DM2 (diabetes mellitus, type 2) ([place]) 02/13/2019  . Syncope 02/13/2019  . Essential hypertension 02/13/2019  . Class 3 severe obesity with serious comorbidity and body mass index (BMI) of 45.0 to 49.9 in  adult ([place]) 10/04/2017  . Class 3 severe obesity without serious comorbidity with body mass index (BMI) of 45.0 to 49.9 in adult ([place]) 04/12/2017  . Vitamin D deficiency 04/12/2017  . Other fatigue 12/31/2016  . SOB (shortness of breath) on exertion 12/31/2016  . Type 2 diabetes mellitus without complication, without long-term current use of insulin ([place]) 12/31/2016  . Morbid obesity ([place]) 12/31/2016  . OA (osteoarthritis) of hip 12/02/2015    Karema Tocci W. 06/21/2019, 1:16 PM  Frazier Butt., PT   [facility] [address] [place], [place], [address] Phone: [phone]   Fax:  [phone]  Name: ARIAM MOL MRN: [id number] Date of Birth: [birth year]

## 2019-06-22 ENCOUNTER — Ambulatory Visit: Payer: 59 | Admitting: Physical Therapy

## 2019-06-23 ENCOUNTER — Ambulatory Visit: Payer: 59 | Admitting: Physical Therapy

## 2019-06-23 ENCOUNTER — Other Ambulatory Visit: Payer: Self-pay

## 2019-06-23 ENCOUNTER — Encounter: Payer: Self-pay | Admitting: Physical Therapy

## 2019-06-23 DIAGNOSIS — R2681 Unsteadiness on feet: Secondary | ICD-10-CM

## 2019-06-23 DIAGNOSIS — M6281 Muscle weakness (generalized): Secondary | ICD-10-CM

## 2019-06-23 DIAGNOSIS — R262 Difficulty in walking, not elsewhere classified: Secondary | ICD-10-CM | POA: Diagnosis not present

## 2019-06-23 NOTE — Therapy (Signed)
[facility] [address] [place], [place], [address] Phone: [phone]   Fax:  [phone]  Physical Therapy Treatment  Patient Details  Name: Laura Blackwell MRN: [id number] Date of Birth: 1956/07/23 Referring Provider (PT): Carol Ada, MD   Encounter Date: 06/23/2019  PT End of Session - 06/23/19 2010    Visit Number  16    Number of Visits  17    Date for PT Re-Evaluation  06/25/19    Authorization Type  UHC    Authorization - Visit Number  16    Authorization - Number of Visits  60    PT Start Time  8502    PT Stop Time  0931    PT Time Calculation (min)  42 min    Equipment Utilized During Treatment  Gait belt    Activity Tolerance  Patient tolerated treatment well;Patient limited by fatigue   HR up to 125 with gait; needs seated rest breaks   Behavior During Therapy  [facility] for tasks assessed/performed       Past Medical History:  Diagnosis Date  . Arthritis   . Diabetes mellitus without complication ([place])   . Edema    in left leg in 1980s on left ankle   . Hyperlipidemia   . Hypertension   . PCOS (polycystic ovarian syndrome)   . Superficial thrombophlebitis   . Swelling   . Venous insufficiency   . Vitamin D deficiency     Past Surgical History:  Procedure Laterality Date  . BREAST BIOPSY Right   . JOINT REPLACEMENT  2011    right hip   . left ankle surgery     . TOTAL HIP ARTHROPLASTY Left 12/02/2015   Procedure: LEFT TOTAL HIP ARTHROPLASTY ANTERIOR APPROACH;  Surgeon: [name], MD;  Location: [facility];  Service: Orthopedics;  Laterality: Left;    There were no vitals filed for this visit.  Subjective Assessment - 06/23/19 0852    Subjective  Feel like the exercises are helping strengthening more.  Feel like I'm 55%-60% back to normal.  Want to be able to due to curb, walking without the cane.    Pertinent History  spinal cord CVA 01/2019, diabetes mellitus, HTN, PCOS, vitamin D  deficiency, sp right (2011) and left (2017) hip replacement, peripheral edema, venous insufficiency.    How long can you walk comfortably?  only household distances, states could not walk around track in therapy gym with cane    Diagnostic tests  MRI showed an abnormal focus in the conus medullaris and lower spinal cord adjacent to T12-L1.    Patient Stated Goals  "wants to be 99.99% better, wants to be the best she can be" - needs to strengthen thigh area    Currently in Pain?  No/denies         [facility] PT Assessment - 06/23/19 0001      Ambulation/Gait   Ambulation/Gait  Yes    Ambulation/Gait Assistance  5: Supervision    Ambulation/Gait Assistance Details  Used RW for 3MWT; other short distance gait in clinic was with cane    Ambulation Distance (Feet)  450 Feet    Assistive device  Straight cane;Rolling walker    Gait Pattern  Step-through pattern;Decreased stride length;Decreased stance time - left;Decreased step length - right;Decreased weight shift to left;Decreased trunk rotation;Narrow base of support;Lateral hip instability;Decreased arm swing - right;Decreased arm swing - left    Ambulation Surface  Level;Indoor    Gait  velocity  2.4 ft/sec with cane    Gait Comments  HR 3 min:  97 bpm, O2 sats 97%; post 3 minute:  HR 126 bpm, Os 93%.  3 minute walk test distance:  395 ft (improved from 266 ft)      [employer] Test   Sit to Stand  Able to stand without using hands and stabilize independently    Standing Unsupported  Able to stand safely 2 minutes    Sitting with Back Unsupported but Feet Supported on Floor or Stool  Able to sit safely and securely 2 minutes    Stand to Sit  Sits safely with minimal use of hands    Transfers  Able to transfer safely, minor use of hands    Standing Unsupported with Eyes Closed  Able to stand 10 seconds safely    Standing Unsupported with Feet Together  Able to place feet together independently and stand 1 minute safely    From Standing, Reach  Forward with Outstretched Arm  Can reach confidently >25 cm (10")    From Standing Position, Pick up Object from Floor  Able to pick up shoe safely and easily    From Standing Position, Turn to Look Behind Over each Shoulder  Looks behind from both sides and weight shifts well    Turn 360 Degrees  Able to turn 360 degrees safely one side only in 4 seconds or less    Standing Unsupported, Alternately Place Feet on Step/Stool  Able to complete >2 steps/needs minimal assist    Standing Unsupported, One Foot in Front  Able to plae foot ahead of the other independently and hold 30 seconds    Standing on One Leg  Tries to lift leg/unable to hold 3 seconds but remains standing independently    Total Score  48    Berg comment:  Scores <45/56 indicate increased fall risk      Timed Up and Go Test   TUG  Normal TUG    Normal TUG (seconds)  15.31    TUG Comments  Scores >13.5 seconds indicate increased fall risk; improved from 16.15 sec                   [facility] Adult PT Treatment/Exercise - 06/23/19 0001      Transfers   Transfers  Sit to Stand;Stand to Sit    Sit to Stand  6: Modified independent (Device/Increase time);With upper extremity assist;From chair/3-in-1    Stand to Sit  5: Supervision;With upper extremity assist;To chair/3-in-1      Standardized Balance Assessment   Standardized Balance Assessment  Berg Balance Test;Timed Up and Go Test      Self-Care   Self-Care  Other Self-Care Comments    Other Self-Care Comments   Discussed progress towards goals, POC, pt's goals for next 8 weeks; pt in agreement with extending POC, increasing frequency to 3x/wk (aware that this may mean in clinic 2 x/wk and aquatics 1x/wk); discussed aquatics therapy as part of POC and pt is interested.             PT Education - 06/23/19 2009    Education Details  Progress towards goals, POC    Person(s) Educated  Patient    Methods  Explanation    Comprehension  Verbalized understanding        PT Short Term Goals - 05/26/19 0820      PT SHORT TERM GOAL #1   Title  Patient will be independent  with initial HEP with focus on strength and balance in order to build upon functional gains in therapy. ALL STGs DUE 05/24/19.    Time  4    Period  Weeks    Status  Partially Met    Target Date  05/24/19      PT SHORT TERM GOAL #2   Title  Patient will improve BERG score by at least 3 points to demonstrate decreased fall risk.    Baseline  44/56 on 04/25/19    Status  Achieved      PT SHORT TERM GOAL #3   Title  Patient will perform 4 steps with both handrails/single railing with step to pattern and supervision in order to safely enter/exit home.    Baseline  8 steps with both handrails - initially step through pattern ascending and descending, 2nd set of 4 did step to pattern to decrease stress and use of BUE on stairs.    Status  Achieved      PT SHORT TERM GOAL #4   Title  Patient will ambulate at least 60' with supervision using cane with step through gait pattern in order to improve house hold mobility.    Status  New      PT SHORT TERM GOAL #5   Title  Patient will decrease TUG score using LRAD to at least 18 seconds in order to decrease risk of falls.    Baseline  16.15 seconds on 05/26/19 with SPC, 17.81 seconds with RW    Status  Achieved      PT SHORT TERM GOAL #6   Title  Patient will undergo further testing of endurance with 3 vs. 6MWT. Goal will be written as appropriate.    Status  Achieved        PT Long Term Goals - 06/23/19 6440      PT LONG TERM GOAL #1   Title  Patient will be independent with final HEP with focus on strength and balance in order to build upon functional gains in therapy. ALL LTGs DUE 06/25/19.    Time  8    Period  Weeks    Status  On-going      PT LONG TERM GOAL #2   Title  Patient will improve BERG score by at least 7 points to demonstrate decreased fall risk.    Baseline  initial = 39/56; Merrilee Jansky 48/56 06/23/2019    Status   Achieved      PT LONG TERM GOAL #3   Title  Patient will decrease TUG score using LRAD to at least 15 seconds in order to decrease risk of falls.    Baseline  15.31 sec 06/23/2019    Status  Partially Met      PT LONG TERM GOAL #4   Title  Patient will perform 14 steps with single railing with step to pattern and supervision in order to safely ascend/descend stairs in her home.    Baseline  06/23/2019:  bilateral rails and step through patterns; at home 1-2 rails and step through pattern    Status  Achieved      PT LONG TERM GOAL #5   Title  Patient will ambulate at least 350' with supervision over indoor level/outdoor unlevel surfaces using cane with step through gait pattern in order to improve community mobility.    Status  On-going      PT LONG TERM GOAL #6   Title  Patient will at least 49' with RW or LRAD  during 3MWT to demonstrate improved walking endurance.    Baseline  266' with RW - 3MWT.    Status  Achieved      PT LONG TERM GOAL #7   Title  Patient will improve gait speed to at least 2.2 ft/sec using LRAD in order to decrease risk of falls.    Baseline  1.41 ft/sec; 06/23/2019:  2.4 ft/sec    Status  Achieved            Plan - 06/23/19 2016    Clinical Impression Statement  Assessed LTGs this visit, with pt meeting 4 of 7 LTGs (met LTG 2 for Berg score 48/56, LTG 4 for stair negotiation, LTG 6 for 3 MWT 395 ft with RW, LTG 7 for gait velocity).  Pt has partially met 1 LTG-LTG 3 for TUG score 15.31 seconds (improved but not to goal level); LTG 1 for final HEP and LTG 4 for longer distance giat with cane are ongoing.  Pt is making signiicant progress with balance and gait measures since PT eval; however, she remains at fall risk per TUG score; pt reports she is 55-60% back to her normal baseline.  She will benefit from skilled PT to continue work towards independent gait and decreased fall risk.  REcert will be completed next visit, in order to capture scores for DGI and  3 MWT with cane.    Personal Factors and Comorbidities  Past/Current Experience;Comorbidity 3+    Comorbidities  spinal cord CVA 01/2019, diabetes mellitus, HTN, PCOS, vitamin D deficiency, sp right (2011) and left (2017) hip replacement, peripheral edema, venous insufficiency.    Examination-Activity Limitations  Sit;Squat;Stairs;Stand;Transfers;Locomotion Level    Examination-Participation Restrictions  Meal Prep;Driving;Community Activity    Stability/Clinical Decision Making  Evolving/Moderate complexity    Rehab Potential  Good    PT Frequency  2x / week    PT Duration  8 weeks    PT Treatment/Interventions  ADLs/Self Care Home Management;DME Instruction;Gait training;Stair training;Functional mobility training;Therapeutic activities;Therapeutic exercise;Balance training;Patient/family education;Neuromuscular re-education;Energy conservation    PT Next Visit Plan  Check DGI, 3 MWT with cane; recert POC (incr. frequ to 3x/wk, may include aquatic therapy); work on hip stregnthening and balance-SLS, tandem stance, glut activation; check HR during activity    PT Home Exercise Plan  [id number] - plus seated hamstring stretch and seated forward flexion low back stretch    Consulted and Agree with Plan of Care  Patient       Patient will benefit from skilled therapeutic intervention in order to improve the following deficits and impairments:  Abnormal gait, Decreased activity tolerance, Decreased balance, Decreased endurance, Decreased coordination, Decreased mobility, Decreased range of motion, Difficulty walking, Decreased strength, Impaired sensation  Visit Diagnosis: Unsteadiness on feet  Difficulty in walking, not elsewhere classified  Muscle weakness (generalized)     Problem List Patient Active Problem List   Diagnosis Date Noted  . Dysesthesia 03/29/2019  . Gait disturbance 03/29/2019  . Spinal cord infarction ([place]) 02/20/2019  . Cauda equina syndrome ([place]) 02/13/2019  . DM2  (diabetes mellitus, type 2) ([place]) 02/13/2019  . Syncope 02/13/2019  . Essential hypertension 02/13/2019  . Class 3 severe obesity with serious comorbidity and body mass index (BMI) of 45.0 to 49.9 in adult ([place]) 10/04/2017  . Class 3 severe obesity without serious comorbidity with body mass index (BMI) of 45.0 to 49.9 in adult ([place]) 04/12/2017  . Vitamin D deficiency 04/12/2017  . Other fatigue 12/31/2016  . SOB (shortness of breath) on  exertion 12/31/2016  . Type 2 diabetes mellitus without complication, without long-term current use of insulin ([place]) 12/31/2016  . Morbid obesity ([place]) 12/31/2016  . OA (osteoarthritis) of hip 12/02/2015    MARRIOTT,AMY W. 06/23/2019, 8:22 PM  Frazier Butt., PT   [facility] [address] [place], [place], [address] Phone: [phone]   Fax:  [phone]  Name: Laura Blackwell MRN: [id number] Date of Birth: 1956/02/02

## 2019-06-27 ENCOUNTER — Other Ambulatory Visit: Payer: Self-pay

## 2019-06-27 ENCOUNTER — Ambulatory Visit: Payer: 59 | Admitting: Physical Therapy

## 2019-06-27 ENCOUNTER — Encounter: Payer: Self-pay | Admitting: Physical Therapy

## 2019-06-27 DIAGNOSIS — R29818 Other symptoms and signs involving the nervous system: Secondary | ICD-10-CM

## 2019-06-27 DIAGNOSIS — M6281 Muscle weakness (generalized): Secondary | ICD-10-CM

## 2019-06-27 DIAGNOSIS — R262 Difficulty in walking, not elsewhere classified: Secondary | ICD-10-CM | POA: Diagnosis not present

## 2019-06-27 DIAGNOSIS — R2681 Unsteadiness on feet: Secondary | ICD-10-CM

## 2019-06-27 NOTE — Therapy (Signed)
[facility] [address] [place], [place], [address] Phone: [phone]   Fax:  [phone]  Physical Therapy Treatment  Patient Details  Name: QUETZALI HEINLE MRN: [id number] Date of Birth: 08-06-1956 Referring Provider (PT): Carol Ada, MD   Encounter Date: 06/27/2019  PT End of Session - 06/27/19 1705    Visit Number  17    Number of Visits  33   per recert 03/70/4888   Date for PT Re-Evaluation  08/26/19    Authorization Type  [employer]    Authorization - Visit Number  17    Authorization - Number of Visits  60    PT Start Time  0931    PT Stop Time  1015    PT Time Calculation (min)  44 min    Equipment Utilized During Treatment  Gait belt    Activity Tolerance  Patient tolerated treatment well;Patient limited by fatigue   HR up to 125 with gait; needs seated rest breaks   Behavior During Therapy  [facility] for tasks assessed/performed       Past Medical History:  Diagnosis Date  . Arthritis   . Diabetes mellitus without complication ([place])   . Edema    in left leg in 1980s on left ankle   . Hyperlipidemia   . Hypertension   . PCOS (polycystic ovarian syndrome)   . Superficial thrombophlebitis   . Swelling   . Venous insufficiency   . Vitamin D deficiency     Past Surgical History:  Procedure Laterality Date  . BREAST BIOPSY Right   . JOINT REPLACEMENT  2011    right hip   . left ankle surgery     . TOTAL HIP ARTHROPLASTY Left 12/02/2015   Procedure: LEFT TOTAL HIP ARTHROPLASTY ANTERIOR APPROACH;  Surgeon: [name], MD;  Location: [facility];  Service: Orthopedics;  Laterality: Left;    There were no vitals filed for this visit.  Subjective Assessment - 06/27/19 0935    Subjective  Ready for the aquatic therapy!  I want to get stronger to get back to work.    Pertinent History  spinal cord CVA 01/2019, diabetes mellitus, HTN, PCOS, vitamin D deficiency, sp right (2011) and left (2017) hip  replacement, peripheral edema, venous insufficiency.    How long can you walk comfortably?  only household distances, states could not walk around track in therapy gym with cane    Diagnostic tests  MRI showed an abnormal focus in the conus medullaris and lower spinal cord adjacent to T12-L1.    Patient Stated Goals  "wants to be 99.99% better, wants to be the best she can be" - needs to strengthen thigh area    Currently in Pain?  No/denies                       [facility] Adult PT Treatment/Exercise - 06/27/19 0001      Ambulation/Gait   Ambulation/Gait  Yes    Ambulation/Gait Assistance  5: Supervision    Ambulation/Gait Assistance Details  Increased R lateral trunk lean after about 160 ft of gait    Ambulation Distance (Feet)  450 Feet    Assistive device  Straight cane    Gait Pattern  Step-through pattern;Decreased stride length;Decreased stance time - left;Decreased step length - right;Decreased weight shift to left;Decreased trunk rotation;Narrow base of support;Lateral hip instability;Decreased arm swing - right;Decreased arm swing - left    Ambulation Surface  Level;Unlevel  Gait Comments  Pre HR 3 minute:  86 bpm, 99% O2; post 3 minute walk:  HR 123 bpm, O2 sats 95%.  3 minute walk test:  with cane; 396 ft.       Standardized Balance Assessment   Standardized Balance Assessment  Dynamic Gait Index      Dynamic Gait Index   Level Surface  Moderate Impairment   10.03   Change in Gait Speed  Mild Impairment    Gait with Horizontal Head Turns  Mild Impairment    Gait with Vertical Head Turns  Mild Impairment    Gait and Pivot Turn  Mild Impairment   extra steps, uses cane   Step Over Obstacle  Severe Impairment    Step Around Obstacles  Mild Impairment    Steps  Mild Impairment    Total Score  13      Lumbar Exercises: Standing   Other Standing Lumbar Exercises  Standing Zoom ball activity for improved trunk control and stregnthening, x 2 minutes with varied  arm positions.      Lumbar Exercises: Seated   Other Seated Lumbar Exercises  Seated anterior/posterior pelvic tilts; seated lateral pelvic tilts, 2 sets x 10 reps, with added arm reach for trunk stretch and strengthening    Other Seated Lumbar Exercises  Seated trunk control and strengthening exercises, using Zoom ball, seated at edge of mat, then seated on green balance disk, with arms coordinated open side, then UE diagonals, x 2 minutes, 2 sets.         Reviewed pt's HEP:   Standing Hip Abduction with Counter Support - 5 reps - 3 sets - 2x daily - 7x weekly-Verbally reviewed Standing Marching - 5 reps - 3 sets - 2x daily - 7x weekly-Verbally reviewed Standing Knee Flexion AROM with Chair Support - 5 reps - 3 sets - 2x daily - 7x weekly-PERFORMED, with pt return demo understanding RLE (pt reports she hasn't been doing this one at home) Seated Ankle Plantar Flexion with Resistance Loop - 10 reps - 2-3 sets - 3 sec hold - 1x daily - 7x weekly-verbally reviewed Mini Squat - 10 reps - 2 sets - 1x daily - 7x weekly-PERFORMED, with pt return demo understanding (pt reports she hasn't been doing this one at home)  Pt return demo the following exercises: Clamshell - 10 reps - 2 sets - 2x daily - 7x weekly Supine Diaphragmatic Breathing - 10 reps - 3 sets - 1x daily - 7x weekly Hooklying Single Leg Bent Knee Fallouts with Resistance - 10 reps - 2 sets - 2x daily - 7x weekly Hooklying Gluteal Sets - 10 reps - 3 sets - 2x daily - 7x weekly   Verbally reviewed with pt verbalizing understanding.  From prior:  1-Seated heel digs: -Scoot out to the edge of the chair, and sit tall -Dig your heels in and back, hold 3 seconds and then relax -Repeat 10 times, 2 sets per day  2-Seated core activation -Sitting at the edge of your chair -Tuck your belly button towards your spine to warm up your abdominal muscles, hold 3 seconds then  relax -Repeat 10 times, 2-3 sets per day  Walking program:-  -Walk for 3 minutes with your walker, indoors, staying tall and keeping your abdominal muscles tight -if you need to take a break, do that briefly and then complete the 3 minutes of your walk. -Do this 3 times per day (Pt reports she is doing 3 minutes of walking outdoors  when family member is home)   PT Short Term Goals - 05/26/19 0820      PT SHORT TERM GOAL #1   Title  Patient will be independent with initial HEP with focus on strength and balance in order to build upon functional gains in therapy. ALL STGs DUE 05/24/19.    Time  4    Period  Weeks    Status  Partially Met    Target Date  05/24/19      PT SHORT TERM GOAL #2   Title  Patient will improve BERG score by at least 3 points to demonstrate decreased fall risk.    Baseline  44/56 on 04/25/19    Status  Achieved      PT SHORT TERM GOAL #3   Title  Patient will perform 4 steps with both handrails/single railing with step to pattern and supervision in order to safely enter/exit home.    Baseline  8 steps with both handrails - initially step through pattern ascending and descending, 2nd set of 4 did step to pattern to decrease stress and use of BUE on stairs.    Status  Achieved      PT SHORT TERM GOAL #4   Title  Patient will ambulate at least 37' with supervision using cane with step through gait pattern in order to improve house hold mobility.    Status  New      PT SHORT TERM GOAL #5   Title  Patient will decrease TUG score using LRAD to at least 18 seconds in order to decrease risk of falls.    Baseline  16.15 seconds on 05/26/19 with SPC, 17.81 seconds with RW    Status  Achieved      PT SHORT TERM GOAL #6   Title  Patient will undergo further testing of endurance with 3 vs. 6MWT. Goal will be written as appropriate.    Status  Achieved        PT Long Term Goals - 06/23/19 7867      PT LONG TERM  GOAL #1   Title  Patient will be independent with final HEP with focus on strength and balance in order to build upon functional gains in therapy. ALL LTGs DUE 06/25/19.    Time  8    Period  Weeks    Status  On-going      PT LONG TERM GOAL #2   Title  Patient will improve BERG score by at least 7 points to demonstrate decreased fall risk.    Baseline  initial = 39/56; Merrilee Jansky 48/56 06/23/2019    Status  Achieved      PT LONG TERM GOAL #3   Title  Patient will decrease TUG score using LRAD to at least 15 seconds in order to decrease risk of falls.    Baseline  15.31 sec 06/23/2019    Status  Partially Met      PT LONG TERM GOAL #4   Title  Patient will perform 14 steps with single railing with step to pattern and supervision in order to safely ascend/descend stairs in her home.    Baseline  06/23/2019:  bilateral rails and step through patterns; at home 1-2 rails and step through pattern    Status  Achieved      PT LONG TERM GOAL #5   Title  Patient will ambulate at least 350' with supervision over indoor level/outdoor unlevel surfaces using cane with step through gait pattern in order to  improve community mobility.    Status  On-going      PT LONG TERM GOAL #6   Title  Patient will at least 27' with RW or LRAD during 3MWT to demonstrate improved walking endurance.    Baseline  266' with RW - 3MWT.    Status  Achieved      PT LONG TERM GOAL #7   Title  Patient will improve gait speed to at least 2.2 ft/sec using LRAD in order to decrease risk of falls.    Baseline  1.41 ft/sec; 06/23/2019:  2.4 ft/sec    Status  Achieved            Plan - 06/27/19 1707    Clinical Impression Statement  For recert 47/42/5956:  Per 06/23/19 note:  Assessed LTGs this visit, with pt meeting 4 of 7 LTGs (met LTG 2 for Berg score 48/56, LTG 4 for stair negotiation, LTG 6 for 3 MWT 395 ft with RW, LTG 7 for gait velocity).  Pt has partially met 1 LTG-LTG 3 for TUG score 15.31 seconds (improved but  not to goal level); LTG 1 for final HEP and LTG 4 for longer distance giat with cane are ongoing.  Pt is making signiicant progress with balance and gait measures since PT eval; however, she remains at fall risk per TUG score; pt reports she is 55-60% back to her normal baseline.  06/27/2019:  Performed DGI today, with pt at fall risk per 13/24 DGI score.  Performed 3 MWT with cane, with pt ambulating 396 ft with cane, but with trunk lean signficant after the first half of gait.  She will benefit from skilled PT to continue work towards independent gait and decreased fall risk.    Personal Factors and Comorbidities  Past/Current Experience;Comorbidity 3+    Comorbidities  spinal cord CVA 01/2019, diabetes mellitus, HTN, PCOS, vitamin D deficiency, sp right (2011) and left (2017) hip replacement, peripheral edema, venous insufficiency.    Examination-Activity Limitations  Sit;Squat;Stairs;Stand;Transfers;Locomotion Level    Examination-Participation Restrictions  Meal Prep;Driving;Community Activity    Stability/Clinical Decision Making  Evolving/Moderate complexity    Rehab Potential  Good    PT Frequency  3x / week    PT Duration  8 weeks   per recert 38/75/6433, to include this week of therapy   PT Treatment/Interventions  ADLs/Self Care Home Management;DME Instruction;Gait training;Stair training;Functional mobility training;Therapeutic activities;Therapeutic exercise;Balance training;Patient/family education;Neuromuscular re-education;Energy conservation;Aquatic Therapy;Orthotic Fit/Training    PT Next Visit Plan  Think about trial of heel wedge insert into R shoe to see if that helps with lateral trunk lean/Trendelenburg; continue trunk, hip strengthening, balance-SLS, tandem stance with glut activation; check HR during activity; look at HEP and consider modifying/upgrading HEP as needed    PT Home Exercise Plan  [id number] - plus seated hamstring stretch and seated forward flexion low back stretch     Consulted and Agree with Plan of Care  Patient       Patient will benefit from skilled therapeutic intervention in order to improve the following deficits and impairments:  Abnormal gait, Decreased activity tolerance, Decreased balance, Decreased endurance, Decreased coordination, Decreased mobility, Decreased range of motion, Difficulty walking, Decreased strength, Impaired sensation  Visit Diagnosis: Muscle weakness (generalized)  Unsteadiness on feet  Difficulty in walking, not elsewhere classified  Other symptoms and signs involving the nervous system     Problem List Patient Active Problem List   Diagnosis Date Noted  . Dysesthesia 03/29/2019  . Gait disturbance 03/29/2019  .  Spinal cord infarction ([place]) 02/20/2019  . Cauda equina syndrome ([place]) 02/13/2019  . DM2 (diabetes mellitus, type 2) ([place]) 02/13/2019  . Syncope 02/13/2019  . Essential hypertension 02/13/2019  . Class 3 severe obesity with serious comorbidity and body mass index (BMI) of 45.0 to 49.9 in adult ([place]) 10/04/2017  . Class 3 severe obesity without serious comorbidity with body mass index (BMI) of 45.0 to 49.9 in adult ([place]) 04/12/2017  . Vitamin D deficiency 04/12/2017  . Other fatigue 12/31/2016  . SOB (shortness of breath) on exertion 12/31/2016  . Type 2 diabetes mellitus without complication, without long-term current use of insulin ([place]) 12/31/2016  . Morbid obesity ([place]) 12/31/2016  . OA (osteoarthritis) of hip 12/02/2015    Ruthene Methvin W. 06/27/2019, 5:13 PM  [facility] [address] [place], [place], [address] Phone: [phone]   Fax:  [phone]  Name: TAVON CORRIHER MRN: [id number] Date of Birth: 12/09/1955  New goals for recert: PT Short Term Goals - 06/27/19 1718      PT SHORT TERM GOAL #1   Title  Patient will be independent with progression ofHEP with focus on strength and balance in order to build upon  functional gains in therapy. ALL STGs DUE 07/21/19.    Time  4    Period  Weeks    Status  Revised    Target Date  07/21/19      PT SHORT TERM GOAL #2   Title  Patient will improve DGI to at least 16/24 for decreased fall risk.    Baseline  DGI 13/24 06/27/2019    Time  4    Period  Weeks    Status  New    Target Date  07/21/19      PT SHORT TERM GOAL #3   Title  Pt will ambulate at least 450 ft in 3 minute walk test, with cane, for improved gait endurance and efficiency.    Baseline  396 ft with cane in 3 minute walk test    Time  4    Period  Weeks    Status  New    Target Date  07/21/19      PT SHORT TERM GOAL #4   Title  Pt will improve TUG score to less than or equal to 13.5 sec for decreased fall risk.    Baseline  15.31 sec 06/23/2019    Time  4    Period  Weeks    Status  New    Target Date  07/21/19      PT Long Term Goals - 06/27/19 1721      PT LONG TERM GOAL #1   Title  Patient will be independent with final HEP with focus on strength and balance in order to build upon functional gains in therapy. ALL LTGs DUE 08/18/19.    Time  8    Period  Weeks    Status  On-going    Target Date  08/18/19      PT LONG TERM GOAL #2   Title  Pt will improve DGI score to at least 19/24 for decreased fall risk.    Baseline  DGI 13/24 06/27/2019    Time  8    Period  Weeks    Status  New    Target Date  08/18/19      PT LONG TERM GOAL #3   Title  Pt will improve gait velocity to at least 2.62 ft/sec for  improved gait effiency and safety in community.    Baseline  2.4 ft/sec 06/27/2019    Time  8    Period  Weeks    Status  New    Target Date  08/18/19      PT LONG TERM GOAL #4   Title  Pt will improve 3 MWT with cane to at least 550 ft, for improved gait endurance and efficiency.    Baseline  396 ft 06/27/2019    Time  8    Period  Weeks    Status  New    Target Date  08/18/19      PT LONG TERM GOAL #5   Title  Patient will ambulate at least 800'modified  independently, over indoor level/outdoor unlevel surfaces using cane with step through gait pattern in order to improve community mobility.    Time  8    Period  Weeks    Status  Revised    Target Date  08/18/19      PT LONG TERM GOAL #6   Title  --    Baseline  --    Status  --      PT LONG TERM GOAL #7   Title  --    Baseline  --    Status  --       Mady Haagensen, PT 06/27/19 5:27 PM Phone: [phone] Fax: [phone]

## 2019-06-28 ENCOUNTER — Encounter: Payer: Self-pay | Admitting: Physical Therapy

## 2019-06-28 ENCOUNTER — Ambulatory Visit: Payer: 59 | Admitting: Physical Therapy

## 2019-06-28 DIAGNOSIS — R262 Difficulty in walking, not elsewhere classified: Secondary | ICD-10-CM | POA: Diagnosis not present

## 2019-06-28 DIAGNOSIS — M6281 Muscle weakness (generalized): Secondary | ICD-10-CM

## 2019-06-28 DIAGNOSIS — R2681 Unsteadiness on feet: Secondary | ICD-10-CM

## 2019-06-28 NOTE — Therapy (Signed)
[facility] [address] [place], [place], [address] Phone: [phone]   Fax:  [phone]  Physical Therapy Treatment  Patient Details  Name: Laura Blackwell MRN: [id number] Date of Birth: 02-25-1956 Referring Provider (PT): Carol Ada, MD   Encounter Date: 06/28/2019  PT End of Session - 06/28/19 1842    Visit Number  18   Number of Visits  33   Date for PT Re-Evaluation  08/26/19   Authorization Type  U[employer]    Authorization - Visit Number  18    Authorization - Number of Visits  60    PT Start Time  [id number]    PT Stop Time  1632    PT Time Calculation (min)  45 min    Equipment Utilized During Treatment  --   floatation belt, pool noodle   Activity Tolerance  Patient tolerated treatment well    Behavior During Therapy  WFL for tasks assessed/performed       Past Medical History:  Diagnosis Date  . Arthritis   . Diabetes mellitus without complication ([place])   . Edema    in left leg in 1980s on left ankle   . Hyperlipidemia   . Hypertension   . PCOS (polycystic ovarian syndrome)   . Superficial thrombophlebitis   . Swelling   . Venous insufficiency   . Vitamin D deficiency     Past Surgical History:  Procedure Laterality Date  . BREAST BIOPSY Right   . JOINT REPLACEMENT  2011    right hip   . left ankle surgery     . TOTAL HIP ARTHROPLASTY Left 12/02/2015   Procedure: LEFT TOTAL HIP ARTHROPLASTY ANTERIOR APPROACH;  Surgeon: [name], MD;  Location: [facility];  Service: Orthopedics;  Laterality: Left;    There were no vitals filed for this visit.  Subjective Assessment - 06/28/19 1835    Subjective  Pt excited to start aquatic therapy.  Motivated to increased mobility.  "I want to be able to walk again without the cane."  Denies any pain or changes.    Pertinent History  spinal cord CVA 01/2019, diabetes mellitus, HTN, PCOS, vitamin D deficiency, sp right (2011) and left (2017) hip replacement,  peripheral edema, venous insufficiency.    How long can you walk comfortably?  only household distances, states could not walk around track in therapy gym with cane    Diagnostic tests  MRI showed an abnormal focus in the conus medullaris and lower spinal cord adjacent to T12-L1.    Patient Stated Goals  "wants to be 99.99% better, wants to be the best she can be    Currently in Pain?  No/denies       Aquatic therapy at [facility] - pool temp 87.2 degrees     Patient seen for aquatic therapy today.  Treatment took place in water 3.5-4 feet deep depending upon activity.  Pt entered and exited the pool via step negotiation with use of bilateral hand rails with min to mod assist for safety.   Pt performed runner's stretch on each leg and heel cord stretch bil. LE's with feet on pool wall - 30 sec hold each stretch x 1 rep Pt performed amb. 44m across pool 2 reps forward with use of pool noodle for balance; Performed again x 2 reps of 25 m focusing on bil heel strike and decreasing UE reliance on noodle with PTA holding noodle in front of pt.  sideways walking 2 reps with  use of noodle for UE support with therapist holding noodle for stability.  Side step squats 25 m with use of pool noodle.  Backward walking 25 m x 1 rep with pool noodle.   Pt performed standing hip flexion, abduction and hip extension with knee extended -  10 reps each leg with UE support; Performed 10 reps going from standing to hip flexion back to hip extension with knee flexed then back to standing.  Performed squats x 10 at edge of pool for UE support then x 10 single leg squats with UE support. Marching in place 10 reps each leg with UE support   With pool noodle behind back and floatation belt on, pt floating on back (supine position) with PTA helping to support pt while she performed bicycling LE's x 39m.  Pt needing assist at times to keep hips from sinking in water/to keep at surface.  Pt able to perform 25 m x 2 of walking  floatation belt then 12m x 2 without any floatation assistance.  Pt requires aquatic therapy for the unweighting provided by the buoyancy of the water and needs the viscosity for strengthening LE's and trunk ; buoyancy of water needed for support with walking for balance and for safety as pt is at fall risk on land; Viscosity of water needed for resistance for strengthening bil LE       PT Short Term Goals - 06/27/19 1718      PT SHORT TERM GOAL #1   Title  Patient will be independent with progression ofHEP with focus on strength and balance in order to build upon functional gains in therapy. ALL STGs DUE 07/21/19.    Time  4    Period  Weeks    Status  Revised    Target Date  07/21/19      PT SHORT TERM GOAL #2   Title  Patient will improve DGI to at least 16/24 for decreased fall risk.    Baseline  DGI 13/24 06/27/2019    Time  4    Period  Weeks    Status  New    Target Date  07/21/19      PT SHORT TERM GOAL #3   Title  Pt will ambulate at least 450 ft in 3 minute walk test, with cane, for improved gait endurance and efficiency.    Baseline  396 ft with cane in 3 minute walk test    Time  4    Period  Weeks    Status  New    Target Date  07/21/19      PT SHORT TERM GOAL #4   Title  Pt will improve TUG score to less than or equal to 13.5 sec for decreased fall risk.    Baseline  15.31 sec 06/23/2019    Time  4    Period  Weeks    Status  New    Target Date  07/21/19        PT Long Term Goals - 06/27/19 1721      PT LONG TERM GOAL #1   Title  Patient will be independent with final HEP with focus on strength and balance in order to build upon functional gains in therapy. ALL LTGs DUE 08/18/19.    Time  8    Period  Weeks    Status  On-going    Target Date  08/18/19      PT LONG TERM GOAL #2   Title  Pt  will improve DGI score to at least 19/24 for decreased fall risk.    Baseline  DGI 13/24 06/27/2019    Time  8    Period  Weeks    Status  New    Target Date   08/18/19      PT LONG TERM GOAL #3   Title  Pt will improve gait velocity to at least 2.62 ft/sec for improved gait effiency and safety in community.    Baseline  2.4 ft/sec 06/27/2019    Time  8    Period  Weeks    Status  New    Target Date  08/18/19      PT LONG TERM GOAL #4   Title  Pt will improve 3 MWT with cane to at least 550 ft, for improved gait endurance and efficiency.    Baseline  396 ft 06/27/2019    Time  8    Period  Weeks    Status  New    Target Date  08/18/19      PT LONG TERM GOAL #5   Title  Patient will ambulate at least 800'modified independently, over indoor level/outdoor unlevel surfaces using cane with step through gait pattern in order to improve community mobility.    Time  8    Period  Weeks    Status  Revised    Target Date  08/18/19      PT LONG TERM GOAL #6   Title  --    Baseline  --    Status  --      PT LONG TERM GOAL #7   Title  --    Baseline  --    Status  --            Plan - 06/28/19 1845    Clinical Impression Statement  Pt tolerated first aquatics session well today with decreased fear at end of session along with increased balance in water.  Pt very motivated to increase mobility.  Has decreased heel strike bil along with decreased plantar flexion on Left with push off during gait.  Unable to performed bil heel raises without UE support.    Personal Factors and Comorbidities  Comorbidity 3+;Past/Current Experience    Comorbidities  spinal cord CVA 01/2019, diabetes mellitus, HTN, PCOS, vitamin D deficiency, sp right (2011) and left (2017) hip replacement, peripheral edema, venous insufficiency    Examination-Activity Limitations  Sit;Squat;Stairs;Transfers;Locomotion Level    Examination-Participation Restrictions  Meal Prep;Driving;Community Activity    Stability/Clinical Decision Making  Evolving/Moderate complexity    Rehab Potential  Good    PT Frequency  2x / week    PT Duration  8 weeks    PT Treatment/Interventions   ADLs/Self Care Home Management;Gait training;Stair training;Functional mobility training;Therapeutic activities;Therapeutic exercise;Balance training;Neuromuscular re-education;Patient/family education;Energy conservation    Consulted and Agree with Plan of Care  Patient       Patient will benefit from skilled therapeutic intervention in order to improve the following deficits and impairments:     Visit Diagnosis: Unsteadiness on feet  Difficulty in walking, not elsewhere classified  Muscle weakness (generalized)     Problem List Patient Active Problem List   Diagnosis Date Noted  . Dysesthesia 03/29/2019  . Gait disturbance 03/29/2019  . Spinal cord infarction ([place]) 02/20/2019  . Cauda equina syndrome ([place]) 02/13/2019  . DM2 (diabetes mellitus, type 2) ([place]) 02/13/2019  . Syncope 02/13/2019  . Essential hypertension 02/13/2019  . Class 3 severe obesity with serious comorbidity  and body mass index (BMI) of 45.0 to 49.9 in adult ([place]) 10/04/2017  . Class 3 severe obesity without serious comorbidity with body mass index (BMI) of 45.0 to 49.9 in adult ([place]) 04/12/2017  . Vitamin D deficiency 04/12/2017  . Other fatigue 12/31/2016  . SOB (shortness of breath) on exertion 12/31/2016  . Type 2 diabetes mellitus without complication, without long-term current use of insulin ([place]) 12/31/2016  . Morbid obesity ([place]) 12/31/2016  . OA (osteoarthritis) of hip 12/02/2015    Narda Bonds, PTA [facility] 06/28/19 7:06 PM Phone: [phone] Fax: [facility] [address] [place], [place], [address] Phone: [phone]   Fax:  [phone]  Name: Laura Blackwell MRN: [id number] Date of Birth: [birth year]

## 2019-06-29 ENCOUNTER — Other Ambulatory Visit: Payer: Self-pay

## 2019-06-29 ENCOUNTER — Encounter: Payer: Self-pay | Admitting: Physical Therapy

## 2019-06-29 ENCOUNTER — Ambulatory Visit: Payer: 59 | Admitting: Physical Therapy

## 2019-06-29 DIAGNOSIS — R2681 Unsteadiness on feet: Secondary | ICD-10-CM

## 2019-06-29 DIAGNOSIS — M6281 Muscle weakness (generalized): Secondary | ICD-10-CM

## 2019-06-29 DIAGNOSIS — R29818 Other symptoms and signs involving the nervous system: Secondary | ICD-10-CM

## 2019-06-29 DIAGNOSIS — R262 Difficulty in walking, not elsewhere classified: Secondary | ICD-10-CM | POA: Diagnosis not present

## 2019-06-29 DIAGNOSIS — Z9181 History of falling: Secondary | ICD-10-CM

## 2019-06-29 NOTE — Therapy (Signed)
[facility] [address] [place], [place], [address] Phone: [phone]   Fax:  [phone]  Physical Therapy Treatment  Patient Details  Name: Laura Blackwell MRN: [id number] Date of Birth: 19-Jul-1956 Referring Provider (PT): Carol Ada, MD   Encounter Date: 06/29/2019  PT End of Session - 06/29/19 1000    Visit Number  19    Number of Visits  33   per recert [id number]   Date for PT Re-Evaluation  08/26/19    Authorization Type  UHC    Authorization - Visit Number  19    Authorization - Number of Visits  60    PT Start Time  0848    PT Stop Time  0931    PT Time Calculation (min)  43 min    Equipment Utilized During Treatment  Gait belt    Activity Tolerance  Patient tolerated treatment well;Patient limited by fatigue   HR up to 125 with gait; needs seated rest breaks   Behavior During Therapy  [facility] for tasks assessed/performed       Past Medical History:  Diagnosis Date  . Arthritis   . Diabetes mellitus without complication ([place])   . Edema    in left leg in 1980s on left ankle   . Hyperlipidemia   . Hypertension   . PCOS (polycystic ovarian syndrome)   . Superficial thrombophlebitis   . Swelling   . Venous insufficiency   . Vitamin D deficiency     Past Surgical History:  Procedure Laterality Date  . BREAST BIOPSY Right   . JOINT REPLACEMENT  2011    right hip   . left ankle surgery     . TOTAL HIP ARTHROPLASTY Left 12/02/2015   Procedure: LEFT TOTAL HIP ARTHROPLASTY ANTERIOR APPROACH;  Surgeon: [name], MD;  Location: [facility];  Service: Orthopedics;  Laterality: Left;    There were no vitals filed for this visit.  Subjective Assessment - 06/29/19 0850    Subjective  Aquatic therapy went really well yesterday.    Pertinent History  spinal cord CVA 01/2019, diabetes mellitus, HTN, PCOS, vitamin D deficiency, sp right (2011) and left (2017) hip replacement, peripheral edema, venous  insufficiency.    How long can you walk comfortably?  only household distances, states could not walk around track in therapy gym with cane    Diagnostic tests  MRI showed an abnormal focus in the conus medullaris and lower spinal cord adjacent to T12-L1.    Patient Stated Goals  "wants to be 99.99% better, wants to be the best she can be    Currently in Pain?  No/denies                       [facility] Adult PT Treatment/Exercise - 06/29/19 1004      Ambulation/Gait   Ambulation/Gait  Yes    Ambulation/Gait Assistance  5: Supervision    Ambulation/Gait Assistance Details  Pt with noted increased R lateral trunk lean during gait after approx. 160' - trialed heel wedges in pt's R shoe today due to possible leg length discrepancy contributing to lean, pt demonstrated less lateral trunk lean with thicker heel wedge, however did still note some lateral hip instability during gait espescially after approx. 200'. Pt reported that her gait and walking felt better with use of heel wedge.    Ambulation Distance (Feet)  600 Feet   approx. throughout session - sessions of 115'-230' bouts  Assistive device  Straight cane    Gait Pattern  Step-through pattern;Decreased stride length;Decreased stance time - left;Decreased step length - right;Decreased weight shift to left;Decreased trunk rotation;Narrow base of support;Lateral hip instability;Decreased arm swing - right;Decreased arm swing - left    Ambulation Surface  Level;Indoor    Gait Comments  HR after 115' and 230' bouts of gait approx. 115 bpm. Gave pt thicker heel wedge for home, educated pt to trial wearing it for a couple of hours and see how it feels - if pt has increased hip or low back pain then to take it out. Pt verbalized understanding of instructions.      Neuro Re-ed    Neuro Re-ed Details   On blue mat table in tall kneeling with 14" step anteriorly: 1 x 10 reps mini squats with cues for technique, glute and core activation. In  tall kneeling position with no UE support lateral multi-directional reaching towards cone outside BOS 1 x 10 reps to R, 1 x 10 reps to L for weight shifting and trunk activation. Need for brief rest break between each tall kneeling activity.       Lumbar Exercises: Stretches   Hip Flexor Stretch  Right;Left;3 reps;30 seconds    Hip Flexor Stretch Limitations  Modified thomas test hip flexor stretch off mat, pt reported relief after performing, instructions on how to perform at home.             PT Education - 06/29/19 0959    Education Details  supine hip flexor stretch to perform at home due to reports of anterior hip stiffness, use of heel wedge in R shoe    Person(s) Educated  Patient    Methods  Explanation;Demonstration    Comprehension  Verbalized understanding;Returned demonstration       PT Short Term Goals - 06/27/19 1718      PT SHORT TERM GOAL #1   Title  Patient will be independent with progression ofHEP with focus on strength and balance in order to build upon functional gains in therapy. ALL STGs DUE 07/21/19.    Time  4    Period  Weeks    Status  Revised    Target Date  07/21/19      PT SHORT TERM GOAL #2   Title  Patient will improve DGI to at least 16/24 for decreased fall risk.    Baseline  DGI 13/24 06/27/2019    Time  4    Period  Weeks    Status  New    Target Date  07/21/19      PT SHORT TERM GOAL #3   Title  Pt will ambulate at least 450 ft in 3 minute walk test, with cane, for improved gait endurance and efficiency.    Baseline  396 ft with cane in 3 minute walk test    Time  4    Period  Weeks    Status  New    Target Date  07/21/19      PT SHORT TERM GOAL #4   Title  Pt will improve TUG score to less than or equal to 13.5 sec for decreased fall risk.    Baseline  15.31 sec 06/23/2019    Time  4    Period  Weeks    Status  New    Target Date  07/21/19        PT Long Term Goals - 06/27/19 1721      PT LONG  TERM GOAL #1   Title   Patient will be independent with final HEP with focus on strength and balance in order to build upon functional gains in therapy. ALL LTGs DUE 08/18/19.    Time  8    Period  Weeks    Status  On-going    Target Date  08/18/19      PT LONG TERM GOAL #2   Title  Pt will improve DGI score to at least 19/24 for decreased fall risk.    Baseline  DGI 13/24 06/27/2019    Time  8    Period  Weeks    Status  New    Target Date  08/18/19      PT LONG TERM GOAL #3   Title  Pt will improve gait velocity to at least 2.62 ft/sec for improved gait effiency and safety in community.    Baseline  2.4 ft/sec 06/27/2019    Time  8    Period  Weeks    Status  New    Target Date  08/18/19      PT LONG TERM GOAL #4   Title  Pt will improve 3 MWT with cane to at least 550 ft, for improved gait endurance and efficiency.    Baseline  396 ft 06/27/2019    Time  8    Period  Weeks    Status  New    Target Date  08/18/19      PT LONG TERM GOAL #5   Title  Patient will ambulate at least 800'modified independently, over indoor level/outdoor unlevel surfaces using cane with step through gait pattern in order to improve community mobility.    Time  8    Period  Weeks    Status  Revised    Target Date  08/18/19      PT LONG TERM GOAL #6   Title  --    Baseline  --    Status  --      PT LONG TERM GOAL #7   Title  --    Baseline  --    Status  --            Plan - 06/29/19 1001    Clinical Impression Statement  Focus of today's skilled session included putting a heel wedge in pt's R shoe due to potential leg length discrepancy and to see if it would help lateral trunk lean during gait with SPC after approx. 160'. With thicker heel wedge on R, pt with less lateral trunk lean during gait, however pt still demonstrates lateral hip instability when walking farther distances. Pt subjectively reported that her walking felt much better with the heel wedge. Pt's HR did not rise above approx. 125 bpm  during today's session - intermittent rest breaks taken throughout. Pt remains very motivated, will continue to progress towards LTGs.    Personal Factors and Comorbidities  Past/Current Experience;Comorbidity 3+    Comorbidities  spinal cord CVA 01/2019, diabetes mellitus, HTN, PCOS, vitamin D deficiency, sp right (2011) and left (2017) hip replacement, peripheral edema, venous insufficiency.    Examination-Activity Limitations  Sit;Squat;Stairs;Stand;Transfers;Locomotion Level    Examination-Participation Restrictions  Meal Prep;Driving;Community Activity    Stability/Clinical Decision Making  Evolving/Moderate complexity    Rehab Potential  Good    PT Frequency  3x / week    PT Duration  8 weeks   per recert [id number], to include this week of therapy   PT Treatment/Interventions  ADLs/Self Care  Home Management;DME Instruction;Gait training;Stair training;Functional mobility training;Therapeutic activities;Therapeutic exercise;Balance training;Patient/family education;Neuromuscular re-education;Energy conservation;Aquatic Therapy;Orthotic Fit/Training    PT Next Visit Plan  how was heel wedge? continue trunk, hip strengthening (esp ABD), balance-SLS, tandem stance with glut activation; check HR during activity; modify/condense and upgrade HEP.    PT Home Exercise Plan  [id number] - plus seated hamstring stretch and seated forward flexion low back stretch, supine hip flexor stretch    Consulted and Agree with Plan of Care  Patient       Patient will benefit from skilled therapeutic intervention in order to improve the following deficits and impairments:  Abnormal gait, Decreased activity tolerance, Decreased balance, Decreased endurance, Decreased coordination, Decreased mobility, Decreased range of motion, Difficulty walking, Decreased strength, Impaired sensation  Visit Diagnosis: Unsteadiness on feet  Difficulty in walking, not elsewhere classified  Muscle weakness (generalized)  Other  symptoms and signs involving the nervous system  History of falling     Problem List Patient Active Problem List   Diagnosis Date Noted  . Dysesthesia 03/29/2019  . Gait disturbance 03/29/2019  . Spinal cord infarction ([place]) 02/20/2019  . Cauda equina syndrome ([place]) 02/13/2019  . DM2 (diabetes mellitus, type 2) ([place]) 02/13/2019  . Syncope 02/13/2019  . Essential hypertension 02/13/2019  . Class 3 severe obesity with serious comorbidity and body mass index (BMI) of 45.0 to 49.9 in adult ([place]) 10/04/2017  . Class 3 severe obesity without serious comorbidity with body mass index (BMI) of 45.0 to 49.9 in adult ([place]) 04/12/2017  . Vitamin D deficiency 04/12/2017  . Other fatigue 12/31/2016  . SOB (shortness of breath) on exertion 12/31/2016  . Type 2 diabetes mellitus without complication, without long-term current use of insulin ([place]) 12/31/2016  . Morbid obesity ([place]) 12/31/2016  . OA (osteoarthritis) of hip 12/02/2015    Arliss Journey, PT, DPT  06/29/2019, 12:14 PM  [facility] [address] [place], [place], [address] Phone: [phone]   Fax:  [phone]  Name: LILIEN MARKSBERRY MRN: [id number] Date of Birth: 1956/06/10

## 2019-06-30 ENCOUNTER — Ambulatory Visit: Payer: 59 | Admitting: Physical Therapy

## 2019-07-03 ENCOUNTER — Other Ambulatory Visit: Payer: Self-pay

## 2019-07-03 ENCOUNTER — Encounter: Payer: Self-pay | Admitting: Rehabilitation

## 2019-07-03 ENCOUNTER — Ambulatory Visit: Payer: 59 | Attending: Family Medicine | Admitting: Rehabilitation

## 2019-07-03 DIAGNOSIS — R2681 Unsteadiness on feet: Secondary | ICD-10-CM | POA: Insufficient documentation

## 2019-07-03 DIAGNOSIS — Z9181 History of falling: Secondary | ICD-10-CM | POA: Insufficient documentation

## 2019-07-03 DIAGNOSIS — M6281 Muscle weakness (generalized): Secondary | ICD-10-CM | POA: Insufficient documentation

## 2019-07-03 DIAGNOSIS — R29818 Other symptoms and signs involving the nervous system: Secondary | ICD-10-CM | POA: Diagnosis present

## 2019-07-03 DIAGNOSIS — R262 Difficulty in walking, not elsewhere classified: Secondary | ICD-10-CM | POA: Diagnosis present

## 2019-07-03 NOTE — Therapy (Signed)
[facility] [address] [place], [place], [address] Phone: [phone]   Fax:  [phone]  Physical Therapy Treatment  Patient Details  Name: Laura Blackwell MRN: [id number] Date of Birth: February 04, 1956 Referring Provider (PT): Carol Ada, MD   Encounter Date: 07/03/2019  PT End of Session - 07/03/19 1528    Visit Number  20    Number of Visits  33   per recert [id number]   Date for PT Re-Evaluation  08/26/19    Authorization Type  [employer]    Authorization - Visit Number  20    Authorization - Number of Visits  60    PT Start Time  1102    PT Stop Time  1145    PT Time Calculation (min)  43 min    Equipment Utilized During Treatment  Gait belt    Activity Tolerance  Patient tolerated treatment well;Patient limited by fatigue   HR up to 125 with gait; needs seated rest breaks   Behavior During Therapy  [facility] for tasks assessed/performed       Past Medical History:  Diagnosis Date  . Arthritis   . Diabetes mellitus without complication ([place])   . Edema    in left leg in 1980s on left ankle   . Hyperlipidemia   . Hypertension   . PCOS (polycystic ovarian syndrome)   . Superficial thrombophlebitis   . Swelling   . Venous insufficiency   . Vitamin D deficiency     Past Surgical History:  Procedure Laterality Date  . BREAST BIOPSY Right   . JOINT REPLACEMENT  2011    right hip   . left ankle surgery     . TOTAL HIP ARTHROPLASTY Left 12/02/2015   Procedure: LEFT TOTAL HIP ARTHROPLASTY ANTERIOR APPROACH;  Surgeon: [name], MD;  Location: [facility];  Service: Orthopedics;  Laterality: Left;    There were no vitals filed for this visit.  Subjective Assessment - 07/03/19 1109    Subjective  No changes since last visit.    Pertinent History  spinal cord CVA 01/2019, diabetes mellitus, HTN, PCOS, vitamin D deficiency, sp right (2011) and left (2017) hip replacement, peripheral edema, venous insufficiency.     Currently in Pain?  No/denies                       [facility] Adult PT Treatment/Exercise - 07/03/19 1513      Ambulation/Gait   Ambulation/Gait  Yes    Ambulation/Gait Assistance  5: Supervision;4: Min assist    Ambulation/Gait Assistance Details  S for most of gait, however intermittently provided min A for facilitation of improved trunk elongation on R during gait.      Ambulation Distance (Feet)  230 Feet    Assistive device  Straight cane    Gait Pattern  Step-through pattern;Decreased stride length;Decreased stance time - left;Decreased step length - right;Decreased weight shift to left;Decreased trunk rotation;Narrow base of support;Lateral hip instability;Decreased arm swing - right;Decreased arm swing - left    Ambulation Surface  Level;Indoor      High Level Balance   High Level Balance Comments  Performed standing on foam balance beam (perpendicular) while tapping L heel to floor x 10 reps (cues for slight bend in R knee for strengthening) and vice versa x 10 reps.  Also on foam beam, had pt remain in SLS on beam while advancing opposite LE from ground forward to 8" step (cues to lightly  tap step) and with light UE support x 10 reps on each side.  Marching on foam airex x 20 reps (2 sets) with cues for slower speed during second rep for more balance challenge.        Therapeutic Activites    Other Therapeutic Activities  HR up to 137 during session, however it would decrease with seated rest breaks.  Pt needs several seated rest breaks throughout session.  HR back down to 98 at end of session.       Exercises   Exercises  Other Exercises    Other Exercises   Side stepping with red theraband x 4 reps in // bars>side stepping while in mini squat x 4 reps in // bars, SLS on foam beam while moving opposite LE into abd x 10 reps on each side (with UE support), lateral step ups to 8" steps x 8 reps with BUE support, closed chain hip abd from 4" step x 10 reps on each side.                  PT Short Term Goals - 06/27/19 1718      PT SHORT TERM GOAL #1   Title  Patient will be independent with progression ofHEP with focus on strength and balance in order to build upon functional gains in therapy. ALL STGs DUE 07/21/19.    Time  4    Period  Weeks    Status  Revised    Target Date  07/21/19      PT SHORT TERM GOAL #2   Title  Patient will improve DGI to at least 16/24 for decreased fall risk.    Baseline  DGI 13/24 06/27/2019    Time  4    Period  Weeks    Status  New    Target Date  07/21/19      PT SHORT TERM GOAL #3   Title  Pt will ambulate at least 450 ft in 3 minute walk test, with cane, for improved gait endurance and efficiency.    Baseline  396 ft with cane in 3 minute walk test    Time  4    Period  Weeks    Status  New    Target Date  07/21/19      PT SHORT TERM GOAL #4   Title  Pt will improve TUG score to less than or equal to 13.5 sec for decreased fall risk.    Baseline  15.31 sec 06/23/2019    Time  4    Period  Weeks    Status  New    Target Date  07/21/19        PT Long Term Goals - 06/27/19 1721      PT LONG TERM GOAL #1   Title  Patient will be independent with final HEP with focus on strength and balance in order to build upon functional gains in therapy. ALL LTGs DUE 08/18/19.    Time  8    Period  Weeks    Status  On-going    Target Date  08/18/19      PT LONG TERM GOAL #2   Title  Pt will improve DGI score to at least 19/24 for decreased fall risk.    Baseline  DGI 13/24 06/27/2019    Time  8    Period  Weeks    Status  New    Target Date  08/18/19      PT  LONG TERM GOAL #3   Title  Pt will improve gait velocity to at least 2.62 ft/sec for improved gait effiency and safety in community.    Baseline  2.4 ft/sec 06/27/2019    Time  8    Period  Weeks    Status  New    Target Date  08/18/19      PT LONG TERM GOAL #4   Title  Pt will improve 3 MWT with cane to at least 550 ft, for improved gait  endurance and efficiency.    Baseline  396 ft 06/27/2019    Time  8    Period  Weeks    Status  New    Target Date  08/18/19      PT LONG TERM GOAL #5   Title  Patient will ambulate at least 800'modified independently, over indoor level/outdoor unlevel surfaces using cane with step through gait pattern in order to improve community mobility.    Time  8    Period  Weeks    Status  Revised    Target Date  08/18/19      PT LONG TERM GOAL #6   Title  --    Baseline  --    Status  --      PT LONG TERM GOAL #7   Title  --    Baseline  --    Status  --            Plan - 07/03/19 1110    Clinical Impression Statement  Skilled session focused on high level balance, SLS, strengthening and cardiovascular endurance.  Pt continues to be limited by fatigue and increased HR, but is progressing well.    Personal Factors and Comorbidities  Past/Current Experience;Comorbidity 3+    Comorbidities  spinal cord CVA 01/2019, diabetes mellitus, HTN, PCOS, vitamin D deficiency, sp right (2011) and left (2017) hip replacement, peripheral edema, venous insufficiency.    Examination-Activity Limitations  Sit;Squat;Stairs;Stand;Transfers;Locomotion Level    Examination-Participation Restrictions  Meal Prep;Driving;Community Activity    Stability/Clinical Decision Making  Evolving/Moderate complexity    Rehab Potential  Good    PT Frequency  3x / week    PT Duration  8 weeks   per recert [id number], to include this week of therapy   PT Treatment/Interventions  ADLs/Self Care Home Management;DME Instruction;Gait training;Stair training;Functional mobility training;Therapeutic activities;Therapeutic exercise;Balance training;Patient/family education;Neuromuscular re-education;Energy conservation;Aquatic Therapy;Orthotic Fit/Training    PT Next Visit Plan  Heel wedge is good!  continue trunk, hip strengthening (esp ABD), balance-SLS, tandem stance with glut activation; check HR during activity;  modify/condense and upgrade HEP.    PT Home Exercise Plan  [email or web address] - plus seated hamstring stretch and seated forward flexion low back stretch, supine hip flexor stretch    Consulted and Agree with Plan of Care  Patient       Patient will benefit from skilled therapeutic intervention in order to improve the following deficits and impairments:  Abnormal gait, Decreased activity tolerance, Decreased balance, Decreased endurance, Decreased coordination, Decreased mobility, Decreased range of motion, Difficulty walking, Decreased strength, Impaired sensation  Visit Diagnosis: Unsteadiness on feet  Difficulty in walking, not elsewhere classified  Muscle weakness (generalized)  Other symptoms and signs involving the nervous system     Problem List Patient Active Problem List   Diagnosis Date Noted  . Dysesthesia 03/29/2019  . Gait disturbance 03/29/2019  . Spinal cord infarction ([place]) 02/20/2019  . Cauda equina syndrome ([place]) 02/13/2019  . DM2 (diabetes mellitus,  type 2) ([place]) 02/13/2019  . Syncope 02/13/2019  . Essential hypertension 02/13/2019  . Class 3 severe obesity with serious comorbidity and body mass index (BMI) of 45.0 to 49.9 in adult ([place]) 10/04/2017  . Class 3 severe obesity without serious comorbidity with body mass index (BMI) of 45.0 to 49.9 in adult ([place]) 04/12/2017  . Vitamin D deficiency 04/12/2017  . Other fatigue 12/31/2016  . SOB (shortness of breath) on exertion 12/31/2016  . Type 2 diabetes mellitus without complication, without long-term current use of insulin ([place]) 12/31/2016  . Morbid obesity ([place]) 12/31/2016  . OA (osteoarthritis) of hip 12/02/2015    Cameron Sprang, PT, MPT [facility] [address] [place], [place], [address] Phone: [phone]   Fax:  [phone] 07/03/19, 3:34 PM  Name: Laura Blackwell MRN: [id number] Date of Birth: 1956-02-04

## 2019-07-05 ENCOUNTER — Ambulatory Visit: Payer: 59 | Admitting: Physical Therapy

## 2019-07-07 ENCOUNTER — Ambulatory Visit: Payer: 59 | Admitting: Physical Therapy

## 2019-07-07 ENCOUNTER — Encounter: Payer: Self-pay | Admitting: Physical Therapy

## 2019-07-07 ENCOUNTER — Other Ambulatory Visit: Payer: Self-pay

## 2019-07-07 DIAGNOSIS — R2681 Unsteadiness on feet: Secondary | ICD-10-CM | POA: Diagnosis not present

## 2019-07-07 DIAGNOSIS — M6281 Muscle weakness (generalized): Secondary | ICD-10-CM

## 2019-07-07 DIAGNOSIS — R262 Difficulty in walking, not elsewhere classified: Secondary | ICD-10-CM

## 2019-07-07 DIAGNOSIS — R29818 Other symptoms and signs involving the nervous system: Secondary | ICD-10-CM

## 2019-07-07 DIAGNOSIS — Z9181 History of falling: Secondary | ICD-10-CM

## 2019-07-07 NOTE — Therapy (Signed)
[facility] [address] [place], [place], [address] Phone: [phone]   Fax:  [phone]  Physical Therapy Treatment  Patient Details  Name: Laura Blackwell MRN: [id number] Date of Birth: 10/02/1955 Referring Provider (PT): Carol Ada, MD   Encounter Date: 07/07/2019  PT End of Session - 07/07/19 0944    Visit Number  21    Number of Visits  33   per recert [id number]   Date for PT Re-Evaluation  08/26/19    Authorization Type  UHC    Authorization - Visit Number  21    Authorization - Number of Visits  60    PT Start Time  0846    PT Stop Time  0934    PT Time Calculation (min)  48 min    Equipment Utilized During Treatment  Gait belt    Activity Tolerance  Patient tolerated treatment well   HR up to 125 with gait; needs seated rest breaks   Behavior During Therapy  [facility] for tasks assessed/performed       Past Medical History:  Diagnosis Date  . Arthritis   . Diabetes mellitus without complication ([place])   . Edema    in left leg in 1980s on left ankle   . Hyperlipidemia   . Hypertension   . PCOS (polycystic ovarian syndrome)   . Superficial thrombophlebitis   . Swelling   . Venous insufficiency   . Vitamin D deficiency     Past Surgical History:  Procedure Laterality Date  . BREAST BIOPSY Right   . JOINT REPLACEMENT  2011    right hip   . left ankle surgery     . TOTAL HIP ARTHROPLASTY Left 12/02/2015   Procedure: LEFT TOTAL HIP ARTHROPLASTY ANTERIOR APPROACH;  Surgeon: [name], MD;  Location: [facility];  Service: Orthopedics;  Laterality: Left;    There were no vitals filed for this visit.  Subjective Assessment - 07/07/19 0852    Subjective  Felt good after last session. Legs are not feeling as numb. Had discussion with PCP about going to work and now that date is pushed back to beginning of January (as of now).    Pertinent History  spinal cord CVA 01/2019, diabetes mellitus, HTN,  PCOS, vitamin D deficiency, sp right (2011) and left (2017) hip replacement, peripheral edema, venous insufficiency.    Currently in Pain?  No/denies                            Balance Exercises - 07/07/19 0946      Balance Exercises: Standing   Standing Eyes Closed  Foam/compliant surface;3 reps;30 secs   feet hip width distance.       Access Code: [email or web address]  URL: [email or web address]  Date: 07/07/2019  Prepared by: Janann August   Upgraded/revised HEP:   Exercises Standing Hip Abduction with Counter Support - 5 reps - 3 sets - 2x daily - 7x weekly Standing Marching - 10 reps - 2 sets - 2x daily - 7x weekly - 2 x 10 reps, cues for technique to try to prevent compensatory lean to R when lifting LLE into hip flexion  Seated Ankle Plantar Flexion with Resistance Loop - 10 reps - 2-3 sets - 3 sec hold - 1x daily - 7x weekly Mini Squat - 10 reps - 2 sets - 1x daily - 7x weekly - 2 x 10 reps cues for technique, pt had not  been performing at home.  Clamshell - 10 reps - 2 sets - 2x daily - 7x weekly - upgraded to red theraband, 1 x 10 reps B  Supine Diaphragmatic Breathing - 10 reps - 3 sets - 1x daily - 7x weekly Backward Walking with Counter Support - 2 reps - 2x daily - 7x weekly - new addition, cues for larger step length  Tandem Walking with Counter Support - 2 sets - 2x daily - 7x weekly - new addition  Side Stepping with Resistance at Thighs - 2 sets - 2x daily - 7x weekly - new addition with red theraband at distal thighs, cues for technique and leading with heel.      PT Education - 07/07/19 0944    Education Details  upgraded/revised pt's HEP    Person(s) Educated  Patient    Methods  Explanation;Demonstration;Handout    Comprehension  Verbalized understanding;Returned demonstration       PT Short Term Goals - 06/27/19 1718      PT SHORT TERM GOAL #1   Title  Patient will be independent with progression ofHEP with focus on strength  and balance in order to build upon functional gains in therapy. ALL STGs DUE 07/21/19.    Time  4    Period  Weeks    Status  Revised    Target Date  07/21/19      PT SHORT TERM GOAL #2   Title  Patient will improve DGI to at least 16/24 for decreased fall risk.    Baseline  DGI 13/24 06/27/2019    Time  4    Period  Weeks    Status  New    Target Date  07/21/19      PT SHORT TERM GOAL #3   Title  Pt will ambulate at least 450 ft in 3 minute walk test, with cane, for improved gait endurance and efficiency.    Baseline  396 ft with cane in 3 minute walk test    Time  4    Period  Weeks    Status  New    Target Date  07/21/19      PT SHORT TERM GOAL #4   Title  Pt will improve TUG score to less than or equal to 13.5 sec for decreased fall risk.    Baseline  15.31 sec 06/23/2019    Time  4    Period  Weeks    Status  New    Target Date  07/21/19        PT Long Term Goals - 06/27/19 1721      PT LONG TERM GOAL #1   Title  Patient will be independent with final HEP with focus on strength and balance in order to build upon functional gains in therapy. ALL LTGs DUE 08/18/19.    Time  8    Period  Weeks    Status  On-going    Target Date  08/18/19      PT LONG TERM GOAL #2   Title  Pt will improve DGI score to at least 19/24 for decreased fall risk.    Baseline  DGI 13/24 06/27/2019    Time  8    Period  Weeks    Status  New    Target Date  08/18/19      PT LONG TERM GOAL #3   Title  Pt will improve gait velocity to at least 2.62 ft/sec for improved gait effiency and safety  in community.    Baseline  2.4 ft/sec 06/27/2019    Time  8    Period  Weeks    Status  New    Target Date  08/18/19      PT LONG TERM GOAL #4   Title  Pt will improve 3 MWT with cane to at least 550 ft, for improved gait endurance and efficiency.    Baseline  396 ft 06/27/2019    Time  8    Period  Weeks    Status  New    Target Date  08/18/19      PT LONG TERM GOAL #5   Title  Patient  will ambulate at least 800'modified independently, over indoor level/outdoor unlevel surfaces using cane with step through gait pattern in order to improve community mobility.    Time  8    Period  Weeks    Status  Revised    Target Date  08/18/19      PT LONG TERM GOAL #6   Title  --    Baseline  --    Status  --      PT LONG TERM GOAL #7   Title  --    Baseline  --    Status  --            Plan - 07/07/19 0954    Clinical Impression Statement  Focus of today's skilled session was upgrading and revising pt's HEP. Added resistance to sidelying clamshells B and reviewed proper form for mini squats with wider BOS, as pt had not been performing this at home. Added retro, tandem walking at countertop and side stepping with band. Pt needed intermittent seated rest breaks throughout session, with HR during session no higher than 118 bpm after activity. Will continue to progress towards LTGs.    Personal Factors and Comorbidities  Past/Current Experience;Comorbidity 3+    Comorbidities  spinal cord CVA 01/2019, diabetes mellitus, HTN, PCOS, vitamin D deficiency, sp right (2011) and left (2017) hip replacement, peripheral edema, venous insufficiency.    Examination-Activity Limitations  Sit;Squat;Stairs;Stand;Transfers;Locomotion Level    Examination-Participation Restrictions  Meal Prep;Driving;Community Activity    Stability/Clinical Decision Making  Evolving/Moderate complexity    Rehab Potential  Good    PT Frequency  3x / week    PT Duration  8 weeks   per recert [id number], to include this week of therapy   PT Treatment/Interventions  ADLs/Self Care Home Management;DME Instruction;Gait training;Stair training;Functional mobility training;Therapeutic activities;Therapeutic exercise;Balance training;Patient/family education;Neuromuscular re-education;Energy conservation;Aquatic Therapy;Orthotic Fit/Training    PT Next Visit Plan  how were additions to HEP? continue trunk, hip  strengthening (esp ABD, but really everything), balance-SLS and on compliant surfaces, tandem stance with glut activation; check HR during activity    PT Home Exercise Plan  [email or web address] - plus seated hamstring stretch and seated forward flexion low back stretch, supine hip flexor stretch    Consulted and Agree with Plan of Care  Patient       Patient will benefit from skilled therapeutic intervention in order to improve the following deficits and impairments:  Abnormal gait, Decreased activity tolerance, Decreased balance, Decreased endurance, Decreased coordination, Decreased mobility, Decreased range of motion, Difficulty walking, Decreased strength, Impaired sensation  Visit Diagnosis: Unsteadiness on feet  Difficulty in walking, not elsewhere classified  Muscle weakness (generalized)  Other symptoms and signs involving the nervous system  History of falling     Problem List Patient Active Problem List   Diagnosis Date Noted  .  Dysesthesia 03/29/2019  . Gait disturbance 03/29/2019  . Spinal cord infarction ([place]) 02/20/2019  . Cauda equina syndrome ([place]) 02/13/2019  . DM2 (diabetes mellitus, type 2) ([place]) 02/13/2019  . Syncope 02/13/2019  . Essential hypertension 02/13/2019  . Class 3 severe obesity with serious comorbidity and body mass index (BMI) of 45.0 to 49.9 in adult ([place]) 10/04/2017  . Class 3 severe obesity without serious comorbidity with body mass index (BMI) of 45.0 to 49.9 in adult ([place]) 04/12/2017  . Vitamin D deficiency 04/12/2017  . Other fatigue 12/31/2016  . SOB (shortness of breath) on exertion 12/31/2016  . Type 2 diabetes mellitus without complication, without long-term current use of insulin ([place]) 12/31/2016  . Morbid obesity ([place]) 12/31/2016  . OA (osteoarthritis) of hip 12/02/2015    Arliss Journey, PT, DPT  07/07/2019, 9:56 AM  [facility] [address] [place], [place],  [address] Phone: [phone]   Fax:  [phone]  Name: Laura Blackwell MRN: [id number] Date of Birth: [birth year]

## 2019-07-10 ENCOUNTER — Encounter: Payer: Self-pay | Admitting: Physical Therapy

## 2019-07-10 ENCOUNTER — Ambulatory Visit: Payer: 59 | Admitting: Physical Therapy

## 2019-07-10 ENCOUNTER — Other Ambulatory Visit: Payer: Self-pay

## 2019-07-10 ENCOUNTER — Ambulatory Visit: Payer: 59 | Admitting: Rehabilitation

## 2019-07-10 DIAGNOSIS — R2681 Unsteadiness on feet: Secondary | ICD-10-CM

## 2019-07-10 DIAGNOSIS — R262 Difficulty in walking, not elsewhere classified: Secondary | ICD-10-CM

## 2019-07-10 DIAGNOSIS — M6281 Muscle weakness (generalized): Secondary | ICD-10-CM

## 2019-07-10 DIAGNOSIS — R29818 Other symptoms and signs involving the nervous system: Secondary | ICD-10-CM

## 2019-07-11 ENCOUNTER — Telehealth: Payer: Self-pay | Admitting: Interventional Cardiology

## 2019-07-11 NOTE — Telephone Encounter (Signed)
New Message  Patient is calling in for monitor results. Please give patient a call back to discuss.

## 2019-07-11 NOTE — Therapy (Signed)
[facility] [address] [place], [place], [address] Phone: [phone]   Fax:  [phone]  Physical Therapy Treatment  Patient Details  Name: Laura Blackwell MRN: [id number] Date of Birth: 07-Sep-1955 Referring Provider (PT): Carol Ada, MD   Encounter Date: 07/10/2019  PT End of Session - 07/10/19 2059    Visit Number  22    Number of Visits  33   per recert [id number]   Date for PT Re-Evaluation  08/26/19    Authorization Type  UHC    Authorization - Visit Number  59    Authorization - Number of Visits  60    PT Start Time  [id number]    PT Stop Time  1633    PT Time Calculation (min)  45 min    Equipment Utilized During Treatment  --   pool noodle, ankle cuffs   Activity Tolerance  Patient tolerated treatment well   HR up to 125 with gait; needs seated rest breaks   Behavior During Therapy  [facility] for tasks assessed/performed       Past Medical History:  Diagnosis Date  . Arthritis   . Diabetes mellitus without complication ([place])   . Edema    in left leg in 1980s on left ankle   . Hyperlipidemia   . Hypertension   . PCOS (polycystic ovarian syndrome)   . Superficial thrombophlebitis   . Swelling   . Venous insufficiency   . Vitamin D deficiency     Past Surgical History:  Procedure Laterality Date  . BREAST BIOPSY Right   . JOINT REPLACEMENT  2011    right hip   . left ankle surgery     . TOTAL HIP ARTHROPLASTY Left 12/02/2015   Procedure: LEFT TOTAL HIP ARTHROPLASTY ANTERIOR APPROACH;  Surgeon: [name], MD;  Location: [facility];  Service: Orthopedics;  Laterality: Left;    There were no vitals filed for this visit.  Subjective Assessment - 07/10/19 2057    Subjective  Continues to have less numbness in LE's and reports feeling better overall.  Pleased with her progress during her aquatic session today.  Excited that she can perform heel raises at pool edge with minimal UE assist.  Reports  increased ROM/flexibility during session.    Pertinent History  spinal cord CVA 01/2019, diabetes mellitus, HTN, PCOS, vitamin D deficiency, sp right (2011) and left (2017) hip replacement, peripheral edema, venous insufficiency.    Currently in Pain?  No/denies       Aquatic therapy at [facility] - pool temp 87.2 degrees     Patient seen for aquatic therapy today.  Treatment took place in water 3.5-4 feet deep depending upon activity.  Pt entered and exited the pool via step negotiation with use of bilateral handrails with close supervision for safety.  Pt using cane to ambulate on pool deck to steps.   Pt performed runner's stretch on each leg and heel cord stretch bil. LE's with feet on pool wall - 30 sec hold each stretch x 1 rep Pt performed amb. 56m across pool 2 reps forward with use of pool noodle for balance; Performed again x 2 reps of 25 m without use of pool noodle to further challenge balance.  Side stepping x 2 reps of 80m with use of noodle for UE support with therapist holding noodle for stability.  Side step squats 25 m x 2 reps without use of pool noodle.  Backward walking 25 m x 1  rep with pool noodle.   Pt performed standing hip flexion, abduction and hip extension with knee extended with blue buoyancy cuffs x 10 reps each on bil LE's with UE support; Performed 10 reps going from standing to hip flexion back to hip extension with knee flexed then back to standing.  Performed squats x 10 at edge of pool for UE support then x 10 single leg squats with UE support. Marching in place 10 reps each leg with UE support.  Worked on using minimal amount of UE support necessary to maintain balance.  Standing at pool edge for heel raises x 20 x 2 sets. Pt with improved plantar flexion than on previous session and pt was very happy to be able to do this today.  Pt had slightly less plantar flexion on second set (?fatigue).  Seated on pool bench for bil marching x 15 reps, LAQ x 15 reps and hip abd/add  x 15 reps.  Pt needing cues to engage core/trunk to keep herself seated on bench (to avoid floatation off bench). Pt performed Ai Chi Postures of Floating, Enclosing and Soothing with intermittent UE support. Pt performed SLS x 10 sec x 3 reps each side with intermittent UE support.  Pt with overall less fear today during session along with improved balance with activities.   Able to add buoyancy cuffs today for resistance as well.   Pt requires aquatic therapy for the unweighting provided by the buoyancy of the water and needs the viscosity for strengthening LE's and trunk ; buoyancy of water needed for support with walking for balance and for safety as pt is at fall risk on land; Viscosity of water needed for resistance for strengthening bil LE          PT Short Term Goals - 06/27/19 1718      PT SHORT TERM GOAL #1   Title  Patient will be independent with progression ofHEP with focus on strength and balance in order to build upon functional gains in therapy. ALL STGs DUE 07/21/19.    Time  4    Period  Weeks    Status  Revised    Target Date  07/21/19      PT SHORT TERM GOAL #2   Title  Patient will improve DGI to at least 16/24 for decreased fall risk.    Baseline  DGI 13/24 06/27/2019    Time  4    Period  Weeks    Status  New    Target Date  07/21/19      PT SHORT TERM GOAL #3   Title  Pt will ambulate at least 450 ft in 3 minute walk test, with cane, for improved gait endurance and efficiency.    Baseline  396 ft with cane in 3 minute walk test    Time  4    Period  Weeks    Status  New    Target Date  07/21/19      PT SHORT TERM GOAL #4   Title  Pt will improve TUG score to less than or equal to 13.5 sec for decreased fall risk.    Baseline  15.31 sec 06/23/2019    Time  4    Period  Weeks    Status  New    Target Date  07/21/19        PT Long Term Goals - 06/27/19 1721      PT LONG TERM GOAL #1   Title  Patient  will be independent with final HEP with  focus on strength and balance in order to build upon functional gains in therapy. ALL LTGs DUE 08/18/19.    Time  8    Period  Weeks    Status  On-going    Target Date  08/18/19      PT LONG TERM GOAL #2   Title  Pt will improve DGI score to at least 19/24 for decreased fall risk.    Baseline  DGI 13/24 06/27/2019    Time  8    Period  Weeks    Status  New    Target Date  08/18/19      PT LONG TERM GOAL #3   Title  Pt will improve gait velocity to at least 2.62 ft/sec for improved gait effiency and safety in community.    Baseline  2.4 ft/sec 06/27/2019    Time  8    Period  Weeks    Status  New    Target Date  08/18/19      PT LONG TERM GOAL #4   Title  Pt will improve 3 MWT with cane to at least 550 ft, for improved gait endurance and efficiency.    Baseline  396 ft 06/27/2019    Time  8    Period  Weeks    Status  New    Target Date  08/18/19      PT LONG TERM GOAL #5   Title  Patient will ambulate at least 800'modified independently, over indoor level/outdoor unlevel surfaces using cane with step through gait pattern in order to improve community mobility.    Time  8    Period  Weeks    Status  Revised    Target Date  08/18/19      PT LONG TERM GOAL #6   Title  --    Baseline  --    Status  --      PT LONG TERM GOAL #7   Title  --    Baseline  --    Status  --            Plan - 07/10/19 2100    Clinical Impression Statement  Pt continues to progress in aquatics sessions and has overall decreased fear and increased mobility and balance in pool.  Pt able to perform heel raises with decreased UE support.  Able to tolerate increased challenges to balance and ankle buoyancy cuffs for increased resistance in water.  Continues to be motivated and works well during sesisons without need for rest breaks.  Continue PT per POC.    Personal Factors and Comorbidities  Past/Current Experience;Comorbidity 3+    Comorbidities  spinal cord CVA 01/2019, diabetes mellitus,  HTN, PCOS, vitamin D deficiency, sp right (2011) and left (2017) hip replacement, peripheral edema, venous insufficiency.    Examination-Activity Limitations  Sit;Squat;Stairs;Stand;Transfers;Locomotion Level    Examination-Participation Restrictions  Meal Prep;Driving;Community Activity    Stability/Clinical Decision Making  Evolving/Moderate complexity    Rehab Potential  Good    PT Frequency  3x / week    PT Duration  8 weeks   per recert [id number], to include this week of therapy   PT Treatment/Interventions  ADLs/Self Care Home Management;DME Instruction;Gait training;Stair training;Functional mobility training;Therapeutic activities;Therapeutic exercise;Balance training;Patient/family education;Neuromuscular re-education;Energy conservation;Aquatic Therapy;Orthotic Fit/Training    PT Next Visit Plan  how were additions to HEP? continue trunk, hip strengthening (esp ABD, but really everything), balance-SLS and on compliant surfaces, tandem stance  with glut activation; check HR during activity    PT Home Exercise Plan  [id number] - plus seated hamstring stretch and seated forward flexion low back stretch, supine hip flexor stretch    Consulted and Agree with Plan of Care  Patient       Patient will benefit from skilled therapeutic intervention in order to improve the following deficits and impairments:  Abnormal gait, Decreased activity tolerance, Decreased balance, Decreased endurance, Decreased coordination, Decreased mobility, Decreased range of motion, Difficulty walking, Decreased strength, Impaired sensation  Visit Diagnosis: Unsteadiness on feet  Difficulty in walking, not elsewhere classified  Muscle weakness (generalized)  Other symptoms and signs involving the nervous system     Problem List Patient Active Problem List   Diagnosis Date Noted  . Dysesthesia 03/29/2019  . Gait disturbance 03/29/2019  . Spinal cord infarction ([place]) 02/20/2019  . Cauda equina syndrome  ([place]) 02/13/2019  . DM2 (diabetes mellitus, type 2) ([place]) 02/13/2019  . Syncope 02/13/2019  . Essential hypertension 02/13/2019  . Class 3 severe obesity with serious comorbidity and body mass index (BMI) of 45.0 to 49.9 in adult ([place]) 10/04/2017  . Class 3 severe obesity without serious comorbidity with body mass index (BMI) of 45.0 to 49.9 in adult ([place]) 04/12/2017  . Vitamin D deficiency 04/12/2017  . Other fatigue 12/31/2016  . SOB (shortness of breath) on exertion 12/31/2016  . Type 2 diabetes mellitus without complication, without long-term current use of insulin ([place]) 12/31/2016  . Morbid obesity ([place]) 12/31/2016  . OA (osteoarthritis) of hip 12/02/2015    Narda Bonds, PTA [place] 07/11/19 6:13 PM Phone: [phone] Fax: [employer] [address] [place], [place], [address] Phone: [phone]   Fax:  [phone]  Name: LYNLIE LACROIX MRN: [id number] Date of Birth: 11-21-1955

## 2019-07-11 NOTE — Telephone Encounter (Signed)
I spoke with pt and told her we would call her back once results reviewed by Dr Tamala Julian

## 2019-07-12 ENCOUNTER — Other Ambulatory Visit: Payer: Self-pay

## 2019-07-12 ENCOUNTER — Encounter: Payer: Self-pay | Admitting: Physical Therapy

## 2019-07-12 ENCOUNTER — Ambulatory Visit: Payer: 59 | Admitting: Physical Therapy

## 2019-07-12 DIAGNOSIS — M6281 Muscle weakness (generalized): Secondary | ICD-10-CM

## 2019-07-12 DIAGNOSIS — R2681 Unsteadiness on feet: Secondary | ICD-10-CM

## 2019-07-12 DIAGNOSIS — R29818 Other symptoms and signs involving the nervous system: Secondary | ICD-10-CM

## 2019-07-12 DIAGNOSIS — R262 Difficulty in walking, not elsewhere classified: Secondary | ICD-10-CM

## 2019-07-12 NOTE — Therapy (Signed)
[facility] [address] [place], [place], [address] Phone: [phone]   Fax:  [phone]  Physical Therapy Treatment  Patient Details  Name: Laura Blackwell MRN: [id number] Date of Birth: 1955-11-30 Referring Provider (PT): Carol Ada, MD   Encounter Date: 07/12/2019  PT End of Session - 07/12/19 1106    Visit Number  23    Number of Visits  33   per recert [id number]   Date for PT Re-Evaluation  08/26/19    Authorization Type  UHC    Authorization - Visit Number  23    Authorization - Number of Visits  60    PT Start Time  1102    PT Stop Time  1143    PT Time Calculation (min)  41 min    Equipment Utilized During Treatment  Gait belt    Activity Tolerance  Patient tolerated treatment well   HR up to 125 with gait; needs seated rest breaks   Behavior During Therapy  [facility] for tasks assessed/performed       Past Medical History:  Diagnosis Date  . Arthritis   . Diabetes mellitus without complication ([place])   . Edema    in left leg in 1980s on left ankle   . Hyperlipidemia   . Hypertension   . PCOS (polycystic ovarian syndrome)   . Superficial thrombophlebitis   . Swelling   . Venous insufficiency   . Vitamin D deficiency     Past Surgical History:  Procedure Laterality Date  . BREAST BIOPSY Right   . JOINT REPLACEMENT  2011    right hip   . left ankle surgery     . TOTAL HIP ARTHROPLASTY Left 12/02/2015   Procedure: LEFT TOTAL HIP ARTHROPLASTY ANTERIOR APPROACH;  Surgeon: [name], MD;  Location: [facility];  Service: Orthopedics;  Laterality: Left;    There were no vitals filed for this visit.  Subjective Assessment - 07/12/19 1104    Subjective  No new complaints. Saw Dr. Anne Fu PA, [place], who did an injection into her right hip. No hip pain at this time. No falls.    Pertinent History  spinal cord CVA 01/2019, diabetes mellitus, HTN, PCOS, vitamin D deficiency, sp right (2011) and left  (2017) hip replacement, peripheral edema, venous insufficiency.    How long can you walk comfortably?  only household distances, states could not walk around track in therapy gym with cane    Diagnostic tests  MRI showed an abnormal focus in the conus medullaris and lower spinal cord adjacent to T12-L1.    Patient Stated Goals  "wants to be 99.99% better, wants to be the best she can be    Currently in Pain?  No/denies    Pain Score  0-No pain           [facility] Adult PT Treatment/Exercise - 07/12/19 1107      Transfers   Transfers  Sit to Stand;Stand to Sit    Sit to Stand  6: Modified independent (Device/Increase time);With upper extremity assist;From chair/3-in-1    Stand to Sit  6: Modified independent (Device/Increase time)      Ambulation/Gait   Ambulation/Gait  Yes    Ambulation/Gait Assistance  5: Supervision    Ambulation/Gait Assistance Details  cues on posture, step length and weight shifting    Ambulation Distance (Feet)  --   around the gym with session   Assistive device  Straight cane    Gait Pattern  Step-through pattern;Decreased stride length;Decreased stance time - left;Decreased step length - right;Decreased weight shift to left;Decreased trunk rotation;Narrow base of support;Lateral hip instability;Decreased arm swing - right;Decreased arm swing - left    Ambulation Surface  Level;Indoor      High Level Balance   High Level Balance Activities  Side stepping;Marching forwards;Marching backwards;Tandem walking   tandem fwd/bwd   High Level Balance Comments  on blue mat in parallel bars: 4-5 laps each with cues on form/technique. min guard assist for balance.       Knee/Hip Exercises: Aerobic   Other Aerobic  Scifit UE/LE level 3.0 for 6 minutes with goal >/= 40 rpm for strengthening and activity tolerance.      Knee/Hip Exercises: Standing   Lateral Step Up  Both;1 set;10 reps;Hand Hold: 2;Step Height: 6";Limitations    Lateral Step Up Limitations  cues for  decreased UE support/light UE support with each rep.     Forward Step Up  Both;1 set;10 reps;Hand Hold: 2;Step Height: 6";Limitations    Forward Step Up Limitations  cues for decreased UE/light UE support with each rep.    Other Standing Knee Exercises  in parallel bars with red band around legs above knees:  side stepping in squat position for 3 laps each way, then fwd/bwd lateral stepping in squat position for 3 laps each way. light UE support on bars with cues on form and technique.                PT Short Term Goals - 06/27/19 1718      PT SHORT TERM GOAL #1   Title  Patient will be independent with progression ofHEP with focus on strength and balance in order to build upon functional gains in therapy. ALL STGs DUE 07/21/19.    Time  4    Period  Weeks    Status  Revised    Target Date  07/21/19      PT SHORT TERM GOAL #2   Title  Patient will improve DGI to at least 16/24 for decreased fall risk.    Baseline  DGI 13/24 06/27/2019    Time  4    Period  Weeks    Status  New    Target Date  07/21/19      PT SHORT TERM GOAL #3   Title  Pt will ambulate at least 450 ft in 3 minute walk test, with cane, for improved gait endurance and efficiency.    Baseline  396 ft with cane in 3 minute walk test    Time  4    Period  Weeks    Status  New    Target Date  07/21/19      PT SHORT TERM GOAL #4   Title  Pt will improve TUG score to less than or equal to 13.5 sec for decreased fall risk.    Baseline  15.31 sec 06/23/2019    Time  4    Period  Weeks    Status  New    Target Date  07/21/19        PT Long Term Goals - 06/27/19 1721      PT LONG TERM GOAL #1   Title  Patient will be independent with final HEP with focus on strength and balance in order to build upon functional gains in therapy. ALL LTGs DUE 08/18/19.    Time  8    Period  Weeks    Status  On-going  Target Date  08/18/19      PT LONG TERM GOAL #2   Title  Pt will improve DGI score to at least 19/24  for decreased fall risk.    Baseline  DGI 13/24 06/27/2019    Time  8    Period  Weeks    Status  New    Target Date  08/18/19      PT LONG TERM GOAL #3   Title  Pt will improve gait velocity to at least 2.62 ft/sec for improved gait effiency and safety in community.    Baseline  2.4 ft/sec 06/27/2019    Time  8    Period  Weeks    Status  New    Target Date  08/18/19      PT LONG TERM GOAL #4   Title  Pt will improve 3 MWT with cane to at least 550 ft, for improved gait endurance and efficiency.    Baseline  396 ft 06/27/2019    Time  8    Period  Weeks    Status  New    Target Date  08/18/19      PT LONG TERM GOAL #5   Title  Patient will ambulate at least 800'modified independently, over indoor level/outdoor unlevel surfaces using cane with step through gait pattern in order to improve community mobility.    Time  8    Period  Weeks    Status  Revised    Target Date  08/18/19      PT LONG TERM GOAL #6   Title  --    Baseline  --    Status  --      PT LONG TERM GOAL #7   Title  --    Baseline  --    Status  --            Plan - 07/12/19 1106    Clinical Impression Statement  Today's skilled sessoin continued to focus on LE strengthening, balance reactions and activity tolerance. Rest breaks needed with session with HR max 127-129 bpm with activity, returning to 102-106 at rest. The pt is progressing toward goals and should benefit from continued PT to progress toward unmet goals.    Personal Factors and Comorbidities  Past/Current Experience;Comorbidity 3+    Comorbidities  spinal cord CVA 01/2019, diabetes mellitus, HTN, PCOS, vitamin D deficiency, sp right (2011) and left (2017) hip replacement, peripheral edema, venous insufficiency.    Examination-Activity Limitations  Sit;Squat;Stairs;Stand;Transfers;Locomotion Level    Examination-Participation Restrictions  Meal Prep;Driving;Community Activity    Stability/Clinical Decision Making  Evolving/Moderate  complexity    Rehab Potential  Good    PT Frequency  3x / week    PT Duration  8 weeks   per recert [id number], to include this week of therapy   PT Treatment/Interventions  ADLs/Self Care Home Management;DME Instruction;Gait training;Stair training;Functional mobility training;Therapeutic activities;Therapeutic exercise;Balance training;Patient/family education;Neuromuscular re-education;Energy conservation;Aquatic Therapy;Orthotic Fit/Training    PT Next Visit Plan  continue trunk, hip strengthening (esp ABD, but really everything), balance-SLS and on compliant surfaces, tandem stance with glut activation; check HR during activity    PT Home Exercise Plan  [id number] - plus seated hamstring stretch and seated forward flexion low back stretch, supine hip flexor stretch    Consulted and Agree with Plan of Care  Patient       Patient will benefit from skilled therapeutic intervention in order to improve the following deficits and impairments:  Abnormal gait, Decreased  activity tolerance, Decreased balance, Decreased endurance, Decreased coordination, Decreased mobility, Decreased range of motion, Difficulty walking, Decreased strength, Impaired sensation  Visit Diagnosis: Unsteadiness on feet  Difficulty in walking, not elsewhere classified  Muscle weakness (generalized)  Other symptoms and signs involving the nervous system     Problem List Patient Active Problem List   Diagnosis Date Noted  . Dysesthesia 03/29/2019  . Gait disturbance 03/29/2019  . Spinal cord infarction ([place]) 02/20/2019  . Cauda equina syndrome ([place]) 02/13/2019  . DM2 (diabetes mellitus, type 2) ([place]) 02/13/2019  . Syncope 02/13/2019  . Essential hypertension 02/13/2019  . Class 3 severe obesity with serious comorbidity and body mass index (BMI) of 45.0 to 49.9 in adult ([place]) 10/04/2017  . Class 3 severe obesity without serious comorbidity with body mass index (BMI) of 45.0 to 49.9 in adult ([place]) 04/12/2017  .  Vitamin D deficiency 04/12/2017  . Other fatigue 12/31/2016  . SOB (shortness of breath) on exertion 12/31/2016  . Type 2 diabetes mellitus without complication, without long-term current use of insulin ([place]) 12/31/2016  . Morbid obesity ([place]) 12/31/2016  . OA (osteoarthritis) of hip 12/02/2015    Willow Ora, PTA, [facility] [address] [place], [place] [address] [phone] 07/12/19, 12:34 PM   Name: Laura Blackwell MRN: [id number] Date of Birth: 02-28-1956

## 2019-07-13 ENCOUNTER — Ambulatory Visit: Payer: 59 | Admitting: Physical Therapy

## 2019-07-14 ENCOUNTER — Ambulatory Visit: Payer: 59 | Admitting: Physical Therapy

## 2019-07-14 ENCOUNTER — Other Ambulatory Visit: Payer: Self-pay

## 2019-07-14 ENCOUNTER — Encounter: Payer: Self-pay | Admitting: Physical Therapy

## 2019-07-14 DIAGNOSIS — R2681 Unsteadiness on feet: Secondary | ICD-10-CM

## 2019-07-14 DIAGNOSIS — M6281 Muscle weakness (generalized): Secondary | ICD-10-CM

## 2019-07-14 DIAGNOSIS — R262 Difficulty in walking, not elsewhere classified: Secondary | ICD-10-CM

## 2019-07-14 NOTE — Telephone Encounter (Signed)
Spoke with pt earlier and went over results

## 2019-07-14 NOTE — Therapy (Signed)
[facility] [address] [place], [place], [address] Phone: [phone]   Fax:  [phone]  Physical Therapy Treatment  Patient Details  Name: Laura Blackwell MRN: [id number] Date of Birth: August 12, 1956 Referring Provider (PT): Carol Ada, MD   Encounter Date: 07/14/2019  PT End of Session - 07/14/19 1121    Visit Number  24    Number of Visits  33   per recert [id number]   Date for PT Re-Evaluation  08/26/19    Authorization Type  U[employer]    Authorization - Visit Number  24    Authorization - Number of Visits  60    PT Start Time  0849    PT Stop Time  0930    PT Time Calculation (min)  41 min    Equipment Utilized During Treatment  Gait belt    Activity Tolerance  Patient tolerated treatment well   HR up to 130 with gait; needs seated rest breaks   Behavior During Therapy  [facility] for tasks assessed/performed       Past Medical History:  Diagnosis Date  . Arthritis   . Diabetes mellitus without complication ([place])   . Edema    in left leg in 1980s on left ankle   . Hyperlipidemia   . Hypertension   . PCOS (polycystic ovarian syndrome)   . Superficial thrombophlebitis   . Swelling   . Venous insufficiency   . Vitamin D deficiency     Past Surgical History:  Procedure Laterality Date  . BREAST BIOPSY Right   . JOINT REPLACEMENT  2011    right hip   . left ankle surgery     . TOTAL HIP ARTHROPLASTY Left 12/02/2015   Procedure: LEFT TOTAL HIP ARTHROPLASTY ANTERIOR APPROACH;  Surgeon: [name], MD;  Location: [facility];  Service: Orthopedics;  Laterality: Left;    There were no vitals filed for this visit.  Subjective Assessment - 07/14/19 0851    Subjective  Right hip is still feeling good from the injection. Still hasn't gotten the results of her heart monitor back. No falls. New exercises are going well at home. Feels like she improving even more. Notices her left foot isn't as numb.    Pertinent  History  spinal cord CVA 01/2019, diabetes mellitus, HTN, PCOS, vitamin D deficiency, sp right (2011) and left (2017) hip replacement, peripheral edema, venous insufficiency.    How long can you walk comfortably?  only household distances, states could not walk around track in therapy gym with cane    Diagnostic tests  MRI showed an abnormal focus in the conus medullaris and lower spinal cord adjacent to T12-L1.    Patient Stated Goals  "wants to be 99.99% better, wants to be the best she can be    Currently in Pain?  No/denies                       [facility] Adult PT Treatment/Exercise - 07/14/19 1128      Ambulation/Gait   Ambulation/Gait  Yes    Ambulation/Gait Assistance  5: Supervision    Ambulation/Gait Assistance Details  Ambulated 130' after standing balance exercises with HR at 130 bpm at end of bout of gait. Initial cues for step length.    Ambulation Distance (Feet)  130 Feet    Assistive device  Straight cane    Gait Pattern  Step-through pattern;Decreased stride length;Decreased stance time - left;Decreased step length - right;Decreased  weight shift to left;Decreased trunk rotation;Narrow base of support;Lateral hip instability      Neuro Re-ed    Neuro Re-ed Details   In corner: standing on blue foam wider BOS eyes closed progressing to narrower BOS with eyes closed 4 x 30 seconds, with wider BOS eyes closed head nods 3 x 5 reps up/down, 3 x 5 reps side to side. Standing with narrower BOS multi-directional reaching outside of BOS to tap cones 2 x 10 reps. Min guard for balance, seated rest breaks taken intermittently.       Exercises   Other Exercises   At edge of mat table: mini squat and then halfway lift and back down to mini squat then standing tall - cues for glute activation upon standing and eccentric control to slow down movement, 4 x 7 reps. Using chair anteriorly for UE support: with red theraband around distal thighs, with one LE still, step outs with  contralateral LE to target hip ABD, 2 x 10 reps B, cues for technique and tall posture. Seated breaks taken between each rep.                PT Short Term Goals - 06/27/19 1718      PT SHORT TERM GOAL #1   Title  Patient will be independent with progression ofHEP with focus on strength and balance in order to build upon functional gains in therapy. ALL STGs DUE 07/21/19.    Time  4    Period  Weeks    Status  Revised    Target Date  07/21/19      PT SHORT TERM GOAL #2   Title  Patient will improve DGI to at least 16/24 for decreased fall risk.    Baseline  DGI 13/24 06/27/2019    Time  4    Period  Weeks    Status  New    Target Date  07/21/19      PT SHORT TERM GOAL #3   Title  Pt will ambulate at least 450 ft in 3 minute walk test, with cane, for improved gait endurance and efficiency.    Baseline  396 ft with cane in 3 minute walk test    Time  4    Period  Weeks    Status  New    Target Date  07/21/19      PT SHORT TERM GOAL #4   Title  Pt will improve TUG score to less than or equal to 13.5 sec for decreased fall risk.    Baseline  15.31 sec 06/23/2019    Time  4    Period  Weeks    Status  New    Target Date  07/21/19        PT Long Term Goals - 06/27/19 1721      PT LONG TERM GOAL #1   Title  Patient will be independent with final HEP with focus on strength and balance in order to build upon functional gains in therapy. ALL LTGs DUE 08/18/19.    Time  8    Period  Weeks    Status  On-going    Target Date  08/18/19      PT LONG TERM GOAL #2   Title  Pt will improve DGI score to at least 19/24 for decreased fall risk.    Baseline  DGI 13/24 06/27/2019    Time  8    Period  Weeks    Status  New  Target Date  08/18/19      PT LONG TERM GOAL #3   Title  Pt will improve gait velocity to at least 2.62 ft/sec for improved gait effiency and safety in community.    Baseline  2.4 ft/sec 06/27/2019    Time  8    Period  Weeks    Status  New     Target Date  08/18/19      PT LONG TERM GOAL #4   Title  Pt will improve 3 MWT with cane to at least 550 ft, for improved gait endurance and efficiency.    Baseline  396 ft 06/27/2019    Time  8    Period  Weeks    Status  New    Target Date  08/18/19      PT LONG TERM GOAL #5   Title  Patient will ambulate at least 800'modified independently, over indoor level/outdoor unlevel surfaces using cane with step through gait pattern in order to improve community mobility.    Time  8    Period  Weeks    Status  Revised    Target Date  08/18/19      PT LONG TERM GOAL #6   Title  --    Baseline  --    Status  --      PT LONG TERM GOAL #7   Title  --    Baseline  --    Status  --            Plan - 07/14/19 1130    Clinical Impression Statement  Today's skilled session focused on LE strengthening and balance reactions on foam. Pt needing seated rest breaks throughout session due to fatigue, with HR only elevated to 115 bpm with LE strengthening, at end of session with last bout of gait increased to 130 bpm, after seated rest breaks returns to 102-105 bpm. With increased reps today, pt able to perform head nods with eyes closed on foam x5 reps with no LOB. Will continue to progress towards LTGs.    Personal Factors and Comorbidities  Past/Current Experience;Comorbidity 3+    Comorbidities  spinal cord CVA 01/2019, diabetes mellitus, HTN, PCOS, vitamin D deficiency, sp right (2011) and left (2017) hip replacement, peripheral edema, venous insufficiency.    Examination-Activity Limitations  Sit;Squat;Stairs;Stand;Transfers;Locomotion Level    Examination-Participation Restrictions  Meal Prep;Driving;Community Activity    Stability/Clinical Decision Making  Evolving/Moderate complexity    Rehab Potential  Good    PT Frequency  3x / week    PT Duration  8 weeks   per recert [id number], to include this week of therapy   PT Treatment/Interventions  ADLs/Self Care Home Management;DME  Instruction;Gait training;Stair training;Functional mobility training;Therapeutic activities;Therapeutic exercise;Balance training;Patient/family education;Neuromuscular re-education;Energy conservation;Aquatic Therapy;Orthotic Fit/Training    PT Next Visit Plan  continue trunk, hip strengthening (esp ABD, but really everything), balance-SLS and on compliant surfaces (eyes closed with head nods/turns), tandem stance with glut activation; check HR during activity    PT Home Exercise Plan  [id number] - plus seated hamstring stretch and seated forward flexion low back stretch, supine hip flexor stretch    Consulted and Agree with Plan of Care  Patient       Patient will benefit from skilled therapeutic intervention in order to improve the following deficits and impairments:  Abnormal gait, Decreased activity tolerance, Decreased balance, Decreased endurance, Decreased coordination, Decreased mobility, Decreased range of motion, Difficulty walking, Decreased strength, Impaired sensation  Visit Diagnosis: Unsteadiness  on feet  Difficulty in walking, not elsewhere classified  Muscle weakness (generalized)     Problem List Patient Active Problem List   Diagnosis Date Noted  . Dysesthesia 03/29/2019  . Gait disturbance 03/29/2019  . Spinal cord infarction ([place]) 02/20/2019  . Cauda equina syndrome ([place]) 02/13/2019  . DM2 (diabetes mellitus, type 2) ([place]) 02/13/2019  . Syncope 02/13/2019  . Essential hypertension 02/13/2019  . Class 3 severe obesity with serious comorbidity and body mass index (BMI) of 45.0 to 49.9 in adult ([place]) 10/04/2017  . Class 3 severe obesity without serious comorbidity with body mass index (BMI) of 45.0 to 49.9 in adult ([place]) 04/12/2017  . Vitamin D deficiency 04/12/2017  . Other fatigue 12/31/2016  . SOB (shortness of breath) on exertion 12/31/2016  . Type 2 diabetes mellitus without complication, without long-term current use of insulin ([place]) 12/31/2016  . Morbid  obesity ([place]) 12/31/2016  . OA (osteoarthritis) of hip 12/02/2015    Arliss Journey, PT, DPT  07/14/2019, 11:32 AM  [facility] [address] [place], [place], [address] Phone: [phone]   Fax:  [phone]  Name: Laura Blackwell MRN: [id number] Date of Birth: [birth year]

## 2019-07-17 ENCOUNTER — Ambulatory Visit: Payer: 59 | Admitting: Physical Therapy

## 2019-07-17 ENCOUNTER — Encounter: Payer: Self-pay | Admitting: Physical Therapy

## 2019-07-17 ENCOUNTER — Other Ambulatory Visit: Payer: Self-pay

## 2019-07-17 DIAGNOSIS — M6281 Muscle weakness (generalized): Secondary | ICD-10-CM

## 2019-07-17 DIAGNOSIS — R262 Difficulty in walking, not elsewhere classified: Secondary | ICD-10-CM

## 2019-07-17 DIAGNOSIS — R2681 Unsteadiness on feet: Secondary | ICD-10-CM

## 2019-07-17 NOTE — Therapy (Signed)
[facility] [address] [place], [place], [address] Phone: [phone]   Fax:  [phone]  Physical Therapy Treatment  Patient Details  Name: Laura Blackwell MRN: [id number] Date of Birth: 06/24/1956 Referring Provider (PT): Carol Ada, MD   Encounter Date: 07/17/2019  PT End of Session - 07/17/19 1255    Visit Number  25    Number of Visits  40   per recert [id number] (number should have been 77 to capture 3x/wk for 8 weeks)   Date for PT Re-Evaluation  08/26/19    Authorization Type  UHC    Authorization - Visit Number  25    Authorization - Number of Visits  60    PT Start Time  1022    PT Stop Time  1100    PT Time Calculation (min)  38 min    Equipment Utilized During Treatment  --    Activity Tolerance  Patient tolerated treatment well   HR up to 122 with gait; needs seated rest breaks   Behavior During Therapy  [facility] for tasks assessed/performed       Past Medical History:  Diagnosis Date  . Arthritis   . Diabetes mellitus without complication ([place])   . Edema    in left leg in 1980s on left ankle   . Hyperlipidemia   . Hypertension   . PCOS (polycystic ovarian syndrome)   . Superficial thrombophlebitis   . Swelling   . Venous insufficiency   . Vitamin D deficiency     Past Surgical History:  Procedure Laterality Date  . BREAST BIOPSY Right   . JOINT REPLACEMENT  2011    right hip   . left ankle surgery     . TOTAL HIP ARTHROPLASTY Left 12/02/2015   Procedure: LEFT TOTAL HIP ARTHROPLASTY ANTERIOR APPROACH;  Surgeon: [name], MD;  Location: [facility];  Service: Orthopedics;  Laterality: Left;    There were no vitals filed for this visit.  Subjective Assessment - 07/17/19 1024    Subjective  Got a call from Dr. Tamala Julian and everything is okay with my heart.    Pertinent History  spinal cord CVA 01/2019, diabetes mellitus, HTN, PCOS, vitamin D deficiency, sp right (2011) and left (2017)  hip replacement, peripheral edema, venous insufficiency.    How long can you walk comfortably?  only household distances, states could not walk around track in therapy gym with cane    Diagnostic tests  MRI showed an abnormal focus in the conus medullaris and lower spinal cord adjacent to T12-L1.    Patient Stated Goals  "wants to be 99.99% better, wants to be the best she can be    Currently in Pain?  No/denies                       [facility] Adult PT Treatment/Exercise - 07/17/19 1027      Transfers   Transfers  Sit to Stand;Stand to Sit    Sit to Stand  6: Modified independent (Device/Increase time);With upper extremity assist;From chair/3-in-1;Without upper extremity assist;From bed    Stand to Sit  6: Modified independent (Device/Increase time)    Comments  At least 5 reps from chair, from mat throughout session.      Ambulation/Gait   Ambulation/Gait  Yes    Ambulation/Gait Assistance  5: Supervision    Ambulation Distance (Feet)  510 Feet    Assistive device  Straight cane  Gait Pattern  Step-through pattern;Decreased stride length;Decreased stance time - left;Decreased step length - right;Decreased weight shift to left;Decreased trunk rotation;Narrow base of support;Lateral hip instability    Ambulation Surface  Level;Indoor    Gait Comments  3 MWT:  490 ft with cane; HR after gait:  122 bpm; cues for pursed lip breathing/diaphragm breathing-HR goes down to 105 bpm after 1-2 minutes.      Dynamic Gait Index   Level Surface  Mild Impairment   8.93 sec with cane   Change in Gait Speed  Mild Impairment    Gait with Horizontal Head Turns  Mild Impairment    Gait with Vertical Head Turns  Mild Impairment    Gait and Pivot Turn  Normal    Step Over Obstacle  Moderate Impairment    Step Around Obstacles  Mild Impairment    Steps  Mild Impairment    Total Score  16      Timed Up and Go Test   TUG  Normal TUG    Normal TUG (seconds)  13.93   12.78, 11.65; AVG:   12.79 sec     Knee/Hip Exercises: Aerobic   Other Aerobic  Scifit UE/LE level 3.0 for 8 minutes with goal at least 40 rpm for strengthening and activity tolerance.       Additional gait in session, 80 ft, 40 ft x 2, then 100 ft with cane.  Pt continues to have R lateral trunk lean with gait; no obvious LOB.      PT Education - 07/17/19 1303    Education Details  Progress towards goals, POC    Person(s) Educated  Patient    Methods  Explanation    Comprehension  Verbalized understanding       PT Short Term Goals - 07/17/19 1049      PT SHORT TERM GOAL #1   Title  Patient will be independent with progression ofHEP with focus on strength and balance in order to build upon functional gains in therapy. ALL STGs DUE 07/21/19.    Time  4    Period  Weeks    Status  Revised    Target Date  07/21/19      PT SHORT TERM GOAL #2   Title  Patient will improve DGI to at least 16/24 for decreased fall risk.    Baseline  DGI 13/24 06/27/2019    Time  4    Period  Weeks    Status  Achieved    Target Date  07/21/19      PT SHORT TERM GOAL #3   Title  Pt will ambulate at least 450 ft in 3 minute walk test, with cane, for improved gait endurance and efficiency.    Baseline  396 ft with cane in 3 minute walk test; 490 ft in 3 minutes, 07/17/2019    Time  4    Period  Weeks    Status  Achieved    Target Date  07/21/19      PT SHORT TERM GOAL #4   Title  Pt will improve TUG score to less than or equal to 13.5 sec for decreased fall risk.    Baseline  15.31 sec 06/23/2019; 07/17/2019; 12.79 sec    Time  4    Period  Weeks    Status  Achieved    Target Date  07/21/19        PT Long Term Goals - 06/27/19 1721      PT  LONG TERM GOAL #1   Title  Patient will be independent with final HEP with focus on strength and balance in order to build upon functional gains in therapy. ALL LTGs DUE 08/18/19.    Time  8    Period  Weeks    Status  On-going    Target Date  08/18/19      PT  LONG TERM GOAL #2   Title  Pt will improve DGI score to at least 19/24 for decreased fall risk.    Baseline  DGI 13/24 06/27/2019    Time  8    Period  Weeks    Status  New    Target Date  08/18/19      PT LONG TERM GOAL #3   Title  Pt will improve gait velocity to at least 2.62 ft/sec for improved gait effiency and safety in community.    Baseline  2.4 ft/sec 06/27/2019    Time  8    Period  Weeks    Status  New    Target Date  08/18/19      PT LONG TERM GOAL #4   Title  Pt will improve 3 MWT with cane to at least 550 ft, for improved gait endurance and efficiency.    Baseline  396 ft 06/27/2019    Time  8    Period  Weeks    Status  New    Target Date  08/18/19      PT LONG TERM GOAL #5   Title  Patient will ambulate at least 800'modified independently, over indoor level/outdoor unlevel surfaces using cane with step through gait pattern in order to improve community mobility.    Time  8    Period  Weeks    Status  Revised    Target Date  08/18/19      PT LONG TERM GOAL #6   Title  --    Baseline  --    Status  --      PT LONG TERM GOAL #7   Title  --    Baseline  --    Status  --            Plan - 07/17/19 1259    Clinical Impression Statement  Began assessing STGs this visit, with pt meeting STG 2, 3, 4.  Pt has demonstrated improvement in TUG and DGI scores as well as improved gait distance (by nearly 100 ft) in 3 minute walk test.  She feels that sensation is improving in lower extremities.  She is still at risk for falls per DGI score 16/24.  She will continue to beneift from skilled PT to further address strength and balance.    Personal Factors and Comorbidities  Past/Current Experience;Comorbidity 3+    Comorbidities  spinal cord CVA 01/2019, diabetes mellitus, HTN, PCOS, vitamin D deficiency, sp right (2011) and left (2017) hip replacement, peripheral edema, venous insufficiency.    Examination-Activity Limitations   Sit;Squat;Stairs;Stand;Transfers;Locomotion Level    Examination-Participation Restrictions  Meal Prep;Driving;Community Activity    Stability/Clinical Decision Making  Evolving/Moderate complexity    Rehab Potential  Good    PT Frequency  3x / week    PT Duration  8 weeks   per recert [id number], to include this week of therapy   PT Treatment/Interventions  ADLs/Self Care Home Management;DME Instruction;Gait training;Stair training;Functional mobility training;Therapeutic activities;Therapeutic exercise;Balance training;Patient/family education;Neuromuscular re-education;Energy conservation;Aquatic Therapy;Orthotic Fit/Training    PT Next Visit Plan  Review HEP to address STG  1; continue trunk, hip strengthening (esp ABD, but really everything) (Try aerobic step ups?), balance-SLS and on compliant surfaces (eyes closed with head nods/turns), tandem stance with glut activation; check HR during activity    PT Home Exercise Plan  [id number] - plus seated hamstring stretch and seated forward flexion low back stretch, supine hip flexor stretch    Consulted and Agree with Plan of Care  Patient       Patient will benefit from skilled therapeutic intervention in order to improve the following deficits and impairments:  Abnormal gait, Decreased activity tolerance, Decreased balance, Decreased endurance, Decreased coordination, Decreased mobility, Decreased range of motion, Difficulty walking, Decreased strength, Impaired sensation  Visit Diagnosis: Unsteadiness on feet  Difficulty in walking, not elsewhere classified  Muscle weakness (generalized)     Problem List Patient Active Problem List   Diagnosis Date Noted  . Dysesthesia 03/29/2019  . Gait disturbance 03/29/2019  . Spinal cord infarction ([place]) 02/20/2019  . Cauda equina syndrome ([place]) 02/13/2019  . DM2 (diabetes mellitus, type 2) ([place]) 02/13/2019  . Syncope 02/13/2019  . Essential hypertension 02/13/2019  . Class 3 severe obesity  with serious comorbidity and body mass index (BMI) of 45.0 to 49.9 in adult ([place]) 10/04/2017  . Class 3 severe obesity without serious comorbidity with body mass index (BMI) of 45.0 to 49.9 in adult ([place]) 04/12/2017  . Vitamin D deficiency 04/12/2017  . Other fatigue 12/31/2016  . SOB (shortness of breath) on exertion 12/31/2016  . Type 2 diabetes mellitus without complication, without long-term current use of insulin ([place]) 12/31/2016  . Morbid obesity ([place]) 12/31/2016  . OA (osteoarthritis) of hip 12/02/2015    Eura Radabaugh W. 07/17/2019, 1:04 PM  Frazier Butt., PT   [facility] [address] [place], [place], [address] Phone: [phone]   Fax:  [phone]  Name: Laura Blackwell MRN: [id number] Date of Birth: November 10, 1955

## 2019-07-19 ENCOUNTER — Other Ambulatory Visit: Payer: Self-pay

## 2019-07-19 ENCOUNTER — Encounter: Payer: Self-pay | Admitting: Physical Therapy

## 2019-07-19 ENCOUNTER — Ambulatory Visit: Payer: 59 | Admitting: Physical Therapy

## 2019-07-19 DIAGNOSIS — R2681 Unsteadiness on feet: Secondary | ICD-10-CM

## 2019-07-19 DIAGNOSIS — R262 Difficulty in walking, not elsewhere classified: Secondary | ICD-10-CM

## 2019-07-19 DIAGNOSIS — M6281 Muscle weakness (generalized): Secondary | ICD-10-CM

## 2019-07-19 DIAGNOSIS — Z9181 History of falling: Secondary | ICD-10-CM

## 2019-07-19 DIAGNOSIS — R29818 Other symptoms and signs involving the nervous system: Secondary | ICD-10-CM

## 2019-07-19 NOTE — Therapy (Signed)
[facility] [address] [place], [place], [address] Phone: [phone]   Fax:  [phone]  Physical Therapy Treatment  Patient Details  Name: Laura Blackwell [place] MRN: [id number] Date of Birth: August 19, 1956 Referring Provider (PT): Carol Ada, MD   Encounter Date: 07/19/2019  PT End of Session - 07/19/19 1921    Visit Number  26    Number of Visits  40   per recert [id number] (number should have been 46 to capture 3x/wk for 8 weeks)   Date for PT Re-Evaluation  08/26/19    Authorization Type  UHC    Authorization - Visit Number  26    Authorization - Number of Visits  60    PT Start Time  1500    PT Stop Time  1545    PT Time Calculation (min)  45 min    Equipment Utilized During Treatment  Other (comment)   ankle cuffs for buoyancy, pool noodle, pool barbells   Activity Tolerance  Patient tolerated treatment well   HR up to 122 with gait; needs seated rest breaks   Behavior During Therapy  [facility] for tasks assessed/performed       Past Medical History:  Diagnosis Date  . Arthritis   . Diabetes mellitus without complication ([place])   . Edema    in left leg in 1980s on left ankle   . Hyperlipidemia   . Hypertension   . PCOS (polycystic ovarian syndrome)   . Superficial thrombophlebitis   . Swelling   . Venous insufficiency   . Vitamin D deficiency     Past Surgical History:  Procedure Laterality Date  . BREAST BIOPSY Right   . JOINT REPLACEMENT  2011    right hip   . left ankle surgery     . TOTAL HIP ARTHROPLASTY Left 12/02/2015   Procedure: LEFT TOTAL HIP ARTHROPLASTY ANTERIOR APPROACH;  Surgeon: [name], MD;  Location: [facility];  Service: Orthopedics;  Laterality: Left;    There were no vitals filed for this visit.  Subjective Assessment - 07/19/19 1920    Subjective  Denies any falls or changes.  Feels like she is getting stronger.  Was able to go to [employer] and used electric cart.   States she is still unable to perform heels raises outside of pool.    Pertinent History  spinal cord CVA 01/2019, diabetes mellitus, HTN, PCOS, vitamin D deficiency, sp right (2011) and left (2017) hip replacement, peripheral edema, venous insufficiency.    How long can you walk comfortably?  only household distances, states could not walk around track in therapy gym with cane    Diagnostic tests  MRI showed an abnormal focus in the conus medullaris and lower spinal cord adjacent to T12-L1.    Patient Stated Goals  "wants to be 99.99% better, wants to be the best she can be    Currently in Pain?  No/denies       Aquatic therapy at [facility] - pool temp 87.2 degrees   Patient seen for aquatic therapy today. Treatment took place in water 3.5-4 feet deep depending upon activity. Pt entered and exited the pool via step negotiation with use of bilateral handrails with close supervision for safety.  Pt using cane to ambulate on pool deck to steps.  Pt performed runner's stretch on each leg and heel cord stretch bil. LE's with feet on pool wall - 30 sec hold each stretch x 1 rep.  Repeated again at  end of session. Pt performed amb. 48m across pool 2 reps forward WITHOUT use of pool noodle today; Side stepping x 2 reps of 23m. Side step squats 25 m x 2 reps Backward walking 25 m x 2 rep with pool noodle.  Pt performed standing hip flexion, abduction and hip extension with knee extended with blue buoyancy cuffs x 15 reps each on bil LE's with UE support; Performed 15 reps going from standing to hip flexion back to hip extension with knee flexed then back to standing. Performed squats x 15 at edge of pool for UE support then x 15 single leg squats with UE support.  Pt then able to perform 5 reps of squats without UE support Marching in place 10 reps each leg without UE support.  Standing at pool edge for heel raises x 20 x 2 sets and toe raises x 20 x 1 set.   Pt performed Ai Chi Postures of Floating,  Enclosing and Soothing without UE support then Rounding with intermittent UE support.  Performed > 15 reps each. Pt performed SLS x 10 sec x 3 reps each side with intermittent UE support.  Pt supine with pool noodle behind her back for bicycling x 13m, hip abd/add x 25 m.  Then in same position with bil feet on pool edge, bending bil knees then pushing away from wall with PTA support behind her back and neck.  Performed x 10 reps. Pt requires aquatic therapy for the unweighting provided by the buoyancy of the water and needs the viscosity for strengthening LE's and trunk ; buoyancy of water needed for support with walking for balance and for safety as pt is at Blackwell risk on land; Viscosity of water needed for resistance for strengthening bil LE   PT Short Term Goals - 07/17/19 1049      PT SHORT TERM GOAL #1   Title  Patient will be independent with progression ofHEP with focus on strength and balance in order to build upon functional gains in therapy. ALL STGs DUE 07/21/19.    Time  4    Period  Weeks    Status  Revised    Target Date  07/21/19      PT SHORT TERM GOAL #2   Title  Patient will improve DGI to at least 16/24 for decreased Blackwell risk.    Baseline  DGI 13/24 06/27/2019    Time  4    Period  Weeks    Status  Achieved    Target Date  07/21/19      PT SHORT TERM GOAL #3   Title  Pt will ambulate at least 450 ft in 3 minute walk test, with cane, for improved gait endurance and efficiency.    Baseline  396 ft with cane in 3 minute walk test; 490 ft in 3 minutes, 07/17/2019    Time  4    Period  Weeks    Status  Achieved    Target Date  07/21/19      PT SHORT TERM GOAL #4   Title  Pt will improve TUG score to less than or equal to 13.5 sec for decreased Blackwell risk.    Baseline  15.31 sec 06/23/2019; 07/17/2019; 12.79 sec    Time  4    Period  Weeks    Status  Achieved    Target Date  07/21/19        PT Long Term Goals - 06/27/19 1721  PT LONG TERM GOAL #1    Title  Patient will be independent with final HEP with focus on strength and balance in order to build upon functional gains in therapy. ALL LTGs DUE 08/18/19.    Time  8    Period  Weeks    Status  On-going    Target Date  08/18/19      PT LONG TERM GOAL #2   Title  Pt will improve DGI score to at least 19/24 for decreased Blackwell risk.    Baseline  DGI 13/24 06/27/2019    Time  8    Period  Weeks    Status  New    Target Date  08/18/19      PT LONG TERM GOAL #3   Title  Pt will improve gait velocity to at least 2.62 ft/sec for improved gait effiency and safety in community.    Baseline  2.4 ft/sec 06/27/2019    Time  8    Period  Weeks    Status  New    Target Date  08/18/19      PT LONG TERM GOAL #4   Title  Pt will improve 3 MWT with cane to at least 550 ft, for improved gait endurance and efficiency.    Baseline  396 ft 06/27/2019    Time  8    Period  Weeks    Status  New    Target Date  08/18/19      PT LONG TERM GOAL #5   Title  Patient will ambulate at least 800'modified independently, over indoor level/outdoor unlevel surfaces using cane with step through gait pattern in order to improve community mobility.    Time  8    Period  Weeks    Status  Revised    Target Date  08/18/19      PT LONG TERM GOAL #6   Title  --    Baseline  --    Status  --      PT LONG TERM GOAL #7   Title  --    Baseline  --    Status  --            Plan - 07/19/19 1923    Clinical Impression Statement  Skilled aquatic session continues to focus on balance, strength, gait training properties and muscle recruitment.  Pt continues to progress in aquatics sessions and is performing more activities without use of noodle or UE's for external support. Able to tolerate increased challenges to balance with Ai Chi moves as well. Continue PT per POC.    Personal Factors and Comorbidities  Past/Current Experience;Comorbidity 3+    Comorbidities  spinal cord CVA 01/2019, diabetes mellitus,  HTN, PCOS, vitamin D deficiency, sp right (2011) and left (2017) hip replacement, peripheral edema, venous insufficiency.    Examination-Activity Limitations  Sit;Squat;Stairs;Stand;Transfers;Locomotion Level    Examination-Participation Restrictions  Meal Prep;Driving;Community Activity    Stability/Clinical Decision Making  Evolving/Moderate complexity    Rehab Potential  Good    PT Frequency  3x / week    PT Duration  8 weeks   per recert [id number], to include this week of therapy   PT Treatment/Interventions  ADLs/Self Care Home Management;DME Instruction;Gait training;Stair training;Functional mobility training;Therapeutic activities;Therapeutic exercise;Balance training;Patient/family education;Neuromuscular re-education;Energy conservation;Aquatic Therapy;Orthotic Fit/Training    PT Next Visit Plan  Review HEP to address STG 1; continue trunk, hip strengthening (esp ABD, but really everything) (Try aerobic step ups?), balance-SLS and on compliant surfaces (  eyes closed with head nods/turns), tandem stance with glut activation; check HR during activity    PT Home Exercise Plan  [id number] - plus seated hamstring stretch and seated forward flexion low back stretch, supine hip flexor stretch    Consulted and Agree with Plan of Care  Patient       Patient will benefit from skilled therapeutic intervention in order to improve the following deficits and impairments:  Abnormal gait, Decreased activity tolerance, Decreased balance, Decreased endurance, Decreased coordination, Decreased mobility, Decreased range of motion, Difficulty walking, Decreased strength, Impaired sensation  Visit Diagnosis: Unsteadiness on feet  Difficulty in walking, not elsewhere classified  Muscle weakness (generalized)  Other symptoms and signs involving the nervous system  History of falling     Problem List Patient Active Problem List   Diagnosis Date Noted  . Dysesthesia 03/29/2019  . Gait disturbance  03/29/2019  . Spinal cord infarction ([place]) 02/20/2019  . Cauda equina syndrome ([place]) 02/13/2019  . DM2 (diabetes mellitus, type 2) ([place]) 02/13/2019  . Syncope 02/13/2019  . Essential hypertension 02/13/2019  . Class 3 severe obesity with serious comorbidity and body mass index (BMI) of 45.0 to 49.9 in adult ([place]) 10/04/2017  . Class 3 severe obesity without serious comorbidity with body mass index (BMI) of 45.0 to 49.9 in adult ([place]) 04/12/2017  . Vitamin D deficiency 04/12/2017  . Other fatigue 12/31/2016  . SOB (shortness of breath) on exertion 12/31/2016  . Type 2 diabetes mellitus without complication, without long-term current use of insulin ([place]) 12/31/2016  . Morbid obesity ([place]) 12/31/2016  . OA (osteoarthritis) of hip 12/02/2015    Narda Bonds, PTA [place] 07/19/19 7:32 PM Phone: [phone] Fax: [facility] [address] [place], [place], [address] Phone: [phone]   Fax:  [phone]  Name: Laura Blackwell MRN: [id number] Date of Birth: 1956/03/26

## 2019-07-21 ENCOUNTER — Encounter: Payer: Self-pay | Admitting: Physical Therapy

## 2019-07-21 ENCOUNTER — Other Ambulatory Visit: Payer: Self-pay

## 2019-07-21 ENCOUNTER — Ambulatory Visit: Payer: 59 | Admitting: Physical Therapy

## 2019-07-21 DIAGNOSIS — R2681 Unsteadiness on feet: Secondary | ICD-10-CM | POA: Diagnosis not present

## 2019-07-21 DIAGNOSIS — M6281 Muscle weakness (generalized): Secondary | ICD-10-CM

## 2019-07-21 NOTE — Patient Instructions (Addendum)
Use tennis ball to roll under your foot- -roll it forward and back;  -you can also roll the ball side to side, like a "rainbow"  Freeze a water bottle- roll the frozen water bottle forward and back under your foot  Ankle Alphabet    Using left ankle and foot only, trace the letters of the alphabet. Perform A to Z. Repeat __1-2__ times per set. Do _1___ sets per session. Do _1___ sessions per day.  http://orth.exer.us/17   Copyright  VHI. All rights reserved.    CONSIDER:   Librarian, academic ([employer])   Research scientist (life sciences))   Company secretary ([employer])

## 2019-07-22 NOTE — Therapy (Signed)
[facility] [address] [place], [place], [address] Phone: [phone]   Fax:  [phone]  Physical Therapy Treatment  Patient Details  Name: Laura Blackwell MRN: [id number] Date of Birth: 1956/01/31 Referring Provider (PT): Carol Ada, MD   Encounter Date: 07/21/2019  PT End of Session - 07/22/19 0856    Visit Number  27    Number of Visits  40   per recert [id number] (number should have been 25 to capture 3x/wk for 8 weeks)   Date for PT Re-Evaluation  08/26/19    Authorization Type  UHC    Authorization - Visit Number  66    Authorization - Number of Visits  60    PT Start Time  0848    PT Stop Time  0928    PT Time Calculation (min)  40 min    Activity Tolerance  Patient tolerated treatment well    Behavior During Therapy  [facility] for tasks assessed/performed       Past Medical History:  Diagnosis Date  . Arthritis   . Diabetes mellitus without complication ([place])   . Edema    in left leg in 1980s on left ankle   . Hyperlipidemia   . Hypertension   . PCOS (polycystic ovarian syndrome)   . Superficial thrombophlebitis   . Swelling   . Venous insufficiency   . Vitamin D deficiency     Past Surgical History:  Procedure Laterality Date  . BREAST BIOPSY Right   . JOINT REPLACEMENT  2011    right hip   . left ankle surgery     . TOTAL HIP ARTHROPLASTY Left 12/02/2015   Procedure: LEFT TOTAL HIP ARTHROPLASTY ANTERIOR APPROACH;  Surgeon: [name], MD;  Location: [facility];  Service: Orthopedics;  Laterality: Left;    There were no vitals filed for this visit.  Subjective Assessment - 07/21/19 0851    Subjective  No changes, no falls.  Going to visit my mom today in [place].    Pertinent History  spinal cord CVA 01/2019, diabetes mellitus, HTN, PCOS, vitamin D deficiency, sp right (2011) and left (2017) hip replacement, peripheral edema, venous insufficiency.    How long can you walk comfortably?  only  household distances, states could not walk around track in therapy gym with cane    Diagnostic tests  MRI showed an abnormal focus in the conus medullaris and lower spinal cord adjacent to T12-L1.    Patient Stated Goals  "wants to be 99.99% better, wants to be the best she can be    Currently in Pain?  No/denies                    Initially began reviewing some of her standing HEP:  Tandem gait along counter, x 2 reps, with cues for abdominal activation and to look ahead at visual target.    Self Care: Pt c/o 5/10 pain in plantar aspect of R foot (not rated initially in session), but pt notes that pain occurs when weightbearing.  With palpation, pt noted to be tender to palpation along plantar fascia R foot as well as lateral aspect of R foot distal to lateral malleolus.  Observation noted that pt tends to over pronate R foot during standing SLS or with stance phase of gait.  Discussed shoe wear and orthotics (she is already wearing New Balance sneakers with orthotics, but reports she has had them a while).  Discussed options at [facility]  [employer], or [employer], maintenance (IT) for foot mapping and recommendations for optimal footwear and support.  Further addressed with exercises: -Seated tennis ball roll forward and back, 10 reps, 2 sets; then side to side ("rainbow") roll x 5 reps , R foot -Seated R ankle alphabet activity for ankle range of motion and coordination -Cues for both above exercises for slow, deliberate pace Gastroc stretch below, seated using sheet-pt reports performing this at home, but PT cues pt for slowed, deliberate hold time (not bouncing into the movement), for optimal stretch   [facility] Adult PT Treatment/Exercise - 07/21/19 0912      Knee/Hip Exercises: Stretches   Gastroc Stretch  Right;3 reps   15 seconds     Knee/Hip Exercises: Aerobic   Other Aerobic  Scifit 4 extremities level 3.0 for 8 minutes with pt keeping rpm >70 for lower extremity strengthening.       Knee/Hip Exercises: Standing   Lateral Step Up  Right;Left;1 set;5 reps;Hand Hold: 2;Step Height: 4"    Forward Step Up  Right;Left;1 set;5 reps;Hand Hold: 1;Step Height: 4"    Forward Step Up Limitations  Difficulty leading with RLE, needing increased UE support at counter             PT Education - 07/22/19 0855    Education Details  Foot/ankle stretches; see instructions    Person(s) Educated  Patient    Methods  Explanation;Demonstration;Handout    Comprehension  Verbalized understanding;Returned demonstration       PT Short Term Goals - 07/17/19 1049      PT SHORT TERM GOAL #1   Title  Patient will be independent with progression ofHEP with focus on strength and balance in order to build upon functional gains in therapy. ALL STGs DUE 07/21/19.    Time  4    Period  Weeks    Status  Revised    Target Date  07/21/19      PT SHORT TERM GOAL #2   Title  Patient will improve DGI to at least 16/24 for decreased fall risk.    Baseline  DGI 13/24 06/27/2019    Time  4    Period  Weeks    Status  Achieved    Target Date  07/21/19      PT SHORT TERM GOAL #3   Title  Pt will ambulate at least 450 ft in 3 minute walk test, with cane, for improved gait endurance and efficiency.    Baseline  396 ft with cane in 3 minute walk test; 490 ft in 3 minutes, 07/17/2019    Time  4    Period  Weeks    Status  Achieved    Target Date  07/21/19      PT SHORT TERM GOAL #4   Title  Pt will improve TUG score to less than or equal to 13.5 sec for decreased fall risk.    Baseline  15.31 sec 06/23/2019; 07/17/2019; 12.79 sec    Time  4    Period  Weeks    Status  Achieved    Target Date  07/21/19        PT Long Term Goals - 06/27/19 1721      PT LONG TERM GOAL #1   Title  Patient will be independent with final HEP with focus on strength and balance in order to build upon functional gains in therapy. ALL LTGs DUE 08/18/19.    Time  8    Period  Weeks    Status  On-going    Target  Date  08/18/19      PT LONG TERM GOAL #2   Title  Pt will improve DGI score to at least 19/24 for decreased fall risk.    Baseline  DGI 13/24 06/27/2019    Time  8    Period  Weeks    Status  New    Target Date  08/18/19      PT LONG TERM GOAL #3   Title  Pt will improve gait velocity to at least 2.62 ft/sec for improved gait effiency and safety in community.    Baseline  2.4 ft/sec 06/27/2019    Time  8    Period  Weeks    Status  New    Target Date  08/18/19      PT LONG TERM GOAL #4   Title  Pt will improve 3 MWT with cane to at least 550 ft, for improved gait endurance and efficiency.    Baseline  396 ft 06/27/2019    Time  8    Period  Weeks    Status  New    Target Date  08/18/19      PT LONG TERM GOAL #5   Title  Patient will ambulate at least 800'modified independently, over indoor level/outdoor unlevel surfaces using cane with step through gait pattern in order to improve community mobility.    Time  8    Period  Weeks    Status  Revised    Target Date  08/18/19      PT LONG TERM GOAL #6   Title  --    Baseline  --    Status  --      PT LONG TERM GOAL #7   Title  --    Baseline  --    Status  --            Plan - 07/22/19 0857    Clinical Impression Statement  Began assessing HEP this visit, when pt reports increased pain in R plantar aspect of arch of foot.  With brief assessment, pt tends to over pronate with gait and standing activities on RLE, with pt c/o 5/10 pain today.  Worked on stretches for that area, with pt reporting decreased pain to 2/10 by session's end.  Also provided instruction in optimal footwear selection for foot support.  Pt will conitnue to benefit from skilled PT towards LTGs.    Personal Factors and Comorbidities  Past/Current Experience;Comorbidity 3+    Comorbidities  spinal cord CVA 01/2019, diabetes mellitus, HTN, PCOS, vitamin D deficiency, sp right (2011) and left (2017) hip replacement, peripheral edema, venous  insufficiency.    Examination-Activity Limitations  Sit;Squat;Stairs;Stand;Transfers;Locomotion Level    Examination-Participation Restrictions  Meal Prep;Driving;Community Activity    Stability/Clinical Decision Making  Evolving/Moderate complexity    Rehab Potential  Good    PT Frequency  3x / week    PT Duration  8 weeks   per recert [id number], to include this week of therapy   PT Treatment/Interventions  ADLs/Self Care Home Management;DME Instruction;Gait training;Stair training;Functional mobility training;Therapeutic activities;Therapeutic exercise;Balance training;Patient/family education;Neuromuscular re-education;Energy conservation;Aquatic Therapy;Orthotic Fit/Training    PT Next Visit Plan  Review HEP to address STG 1 (could not do today due to foot pain) and review new ankle/foot stretches; continue trunk, hip strengthening (esp ABD, but really everything) (Try aerobic step ups?), balance-SLS and on compliant surfaces (eyes closed with head nods/turns), tandem stance with  glut activation; check HR during activity    PT Home Exercise Plan  [id number] - plus seated hamstring stretch and seated forward flexion low back stretch, supine hip flexor stretch    Consulted and Agree with Plan of Care  Patient       Patient will benefit from skilled therapeutic intervention in order to improve the following deficits and impairments:  Abnormal gait, Decreased activity tolerance, Decreased balance, Decreased endurance, Decreased coordination, Decreased mobility, Decreased range of motion, Difficulty walking, Decreased strength, Impaired sensation  Visit Diagnosis: Unsteadiness on feet  Muscle weakness (generalized)     Problem List Patient Active Problem List   Diagnosis Date Noted  . Dysesthesia 03/29/2019  . Gait disturbance 03/29/2019  . Spinal cord infarction ([place]) 02/20/2019  . Cauda equina syndrome ([place]) 02/13/2019  . DM2 (diabetes mellitus, type 2) ([place]) 02/13/2019  . Syncope  02/13/2019  . Essential hypertension 02/13/2019  . Class 3 severe obesity with serious comorbidity and body mass index (BMI) of 45.0 to 49.9 in adult ([place]) 10/04/2017  . Class 3 severe obesity without serious comorbidity with body mass index (BMI) of 45.0 to 49.9 in adult ([place]) 04/12/2017  . Vitamin D deficiency 04/12/2017  . Other fatigue 12/31/2016  . SOB (shortness of breath) on exertion 12/31/2016  . Type 2 diabetes mellitus without complication, without long-term current use of insulin ([place]) 12/31/2016  . Morbid obesity ([place]) 12/31/2016  . OA (osteoarthritis) of hip 12/02/2015    Braileigh Landenberger W. 07/22/2019, 9:02 AM  Frazier Butt., PT   [facility] [address] [place], [place], [address] Phone: [phone]   Fax:  [phone]  Name: AL NOURY MRN: [id number] Date of Birth: 02/24/1956

## 2019-07-24 ENCOUNTER — Encounter: Payer: Self-pay | Admitting: Physical Therapy

## 2019-07-24 ENCOUNTER — Ambulatory Visit: Payer: 59 | Admitting: Physical Therapy

## 2019-07-24 ENCOUNTER — Other Ambulatory Visit: Payer: Self-pay

## 2019-07-24 DIAGNOSIS — M6281 Muscle weakness (generalized): Secondary | ICD-10-CM

## 2019-07-24 DIAGNOSIS — R2681 Unsteadiness on feet: Secondary | ICD-10-CM | POA: Diagnosis not present

## 2019-07-24 NOTE — Therapy (Signed)
[facility] [address] [place], [place], [address] Phone: [phone]   Fax:  [phone]  Physical Therapy Treatment  Patient Details  Name: Laura Blackwell MRN: [id number] Date of Birth: 03-21-1956 Referring Provider (PT): Carol Ada, MD   Encounter Date: 07/24/2019  PT End of Session - 07/24/19 1118    Visit Number  28    Number of Visits  40   per recert 35/36/1443 (number should have been 42 to capture 3x/wk for 8 weeks)   Date for PT Re-Evaluation  08/26/19    Authorization Type  [employer]    Authorization - Visit Number  28    Authorization - Number of Visits  60    PT Start Time  0846    PT Stop Time  0927    PT Time Calculation (min)  41 min    Activity Tolerance  Patient tolerated treatment well    Behavior During Therapy  [facility] for tasks assessed/performed       Past Medical History:  Diagnosis Date  . Arthritis   . Diabetes mellitus without complication ([place])   . Edema    in left leg in 1980s on left ankle   . Hyperlipidemia   . Hypertension   . PCOS (polycystic ovarian syndrome)   . Superficial thrombophlebitis   . Swelling   . Venous insufficiency   . Vitamin D deficiency     Past Surgical History:  Procedure Laterality Date  . BREAST BIOPSY Right   . JOINT REPLACEMENT  2011    right hip   . left ankle surgery     . TOTAL HIP ARTHROPLASTY Left 12/02/2015   Procedure: LEFT TOTAL HIP ARTHROPLASTY ANTERIOR APPROACH;  Surgeon: [name], MD;  Location: [facility];  Service: Orthopedics;  Laterality: Left;    There were no vitals filed for this visit.  Subjective Assessment - 07/24/19 0850    Subjective  Been doing the foot exercises and have tried (my mom's) foot insert and so that felt better.  I need to go and look into the shoes.    Pertinent History  spinal cord CVA 01/2019, diabetes mellitus, HTN, PCOS, vitamin D deficiency, sp right (2011) and left (2017) hip replacement, peripheral  edema, venous insufficiency.    How long can you walk comfortably?  only household distances, states could not walk around track in therapy gym with cane    Diagnostic tests  MRI showed an abnormal focus in the conus medullaris and lower spinal cord adjacent to T12-L1.    Patient Stated Goals  "wants to be 99.99% better, wants to be the best she can be    Currently in Pain?  Yes    Pain Score  3     Pain Location  Foot    Pain Orientation  Right    Pain Descriptors / Indicators  Aching    Pain Type  Acute pain    Pain Onset  More than a month ago    Pain Frequency  Constant    Aggravating Factors   walking    Pain Relieving Factors  exercises, trying a shoe insert                       [facility] Adult PT Treatment/Exercise - 07/24/19 0001      Exercises   Exercises  Other Exercises    Other Exercises   Seated diaphragm breathing exercises, for slowed breathing (several rest breaks during  session, with HR 123-130; able to decreased HR to 110 bpm with slowed breathing techniques).  Verbally reviewed ankle/foot exercises provided last visit, with pt verbalizing understanding (with brief demo)      Knee/Hip Exercises: Standing   Lateral Step Up  Right;Left;1 set;5 reps;Hand Hold: 2;Step Height: 4"    Forward Step Up  Right;Left;1 set;Hand Hold: 1;Step Height: 4";10 reps          Balance Exercises - 07/24/19 0856      Balance Exercises: Standing   Standing Eyes Opened  Wide ([place]);Foam/compliant surface;Head turns;5 reps   Head nods, 2 sets, intermittent/decreasing UE support   Balance Beam  Marching in place x 10 reps, alternating forward kicks x 10, alternating forward step taps x 10; standing on beam with alternating UE lifts x 10, then BUE lifts x 10 reps;  alternating back step taps on beam x 10 reps.  All with supervision, intermittent and lessening UE support.    Other Standing Exercises  Stagger stance forward/back rocking to facilate ankle plantarflexion and  dorsiflexion, 2 sets x 10 reps; lateral weigthshifting wtih emphasis on push off through plantarflexion and into more SLS weigthshift, then progress x 10 reps to shift and lift (wide BOS hamstring curls), then progress to rock and reach at cabinet, x 10 reps each side.  Therapist provides supervision and initial cues.      Pt demo her most updated standing exercises:  Tandem gait along counter, side step squats along counter (cues for upright posture in between squats); attempted heel raises in standing 2 sets x 5 reps-pt unable to perform yet (reports she is doing this in the pool)   PT Short Term Goals - 07/24/19 1133      PT SHORT TERM GOAL #1   Title  Patient will be independent with progression ofHEP with focus on strength and balance in order to build upon functional gains in therapy. ALL STGs DUE 07/21/19.    Time  4    Period  Weeks    Status  Achieved    Target Date  07/21/19      PT SHORT TERM GOAL #2   Title  Patient will improve DGI to at least 16/24 for decreased fall risk.    Baseline  DGI 13/24 06/27/2019    Time  4    Period  Weeks    Status  Achieved    Target Date  07/21/19      PT SHORT TERM GOAL #3   Title  Pt will ambulate at least 450 ft in 3 minute walk test, with cane, for improved gait endurance and efficiency.    Baseline  396 ft with cane in 3 minute walk test; 490 ft in 3 minutes, 07/17/2019    Time  4    Period  Weeks    Status  Achieved    Target Date  07/21/19      PT SHORT TERM GOAL #4   Title  Pt will improve TUG score to less than or equal to 13.5 sec for decreased fall risk.    Baseline  15.31 sec 06/23/2019; 07/17/2019; 12.79 sec    Time  4    Period  Weeks    Status  Achieved    Target Date  07/21/19        PT Long Term Goals - 06/27/19 1721      PT LONG TERM GOAL #1   Title  Patient will be independent with final HEP with focus  on strength and balance in order to build upon functional gains in therapy. ALL LTGs DUE 08/18/19.    Time   8    Period  Weeks    Status  On-going    Target Date  08/18/19      PT LONG TERM GOAL #2   Title  Pt will improve DGI score to at least 19/24 for decreased fall risk.    Baseline  DGI 13/24 06/27/2019    Time  8    Period  Weeks    Status  New    Target Date  08/18/19      PT LONG TERM GOAL #3   Title  Pt will improve gait velocity to at least 2.62 ft/sec for improved gait effiency and safety in community.    Baseline  2.4 ft/sec 06/27/2019    Time  8    Period  Weeks    Status  New    Target Date  08/18/19      PT LONG TERM GOAL #4   Title  Pt will improve 3 MWT with cane to at least 550 ft, for improved gait endurance and efficiency.    Baseline  396 ft 06/27/2019    Time  8    Period  Weeks    Status  New    Target Date  08/18/19      PT LONG TERM GOAL #5   Title  Patient will ambulate at least 800'modified independently, over indoor level/outdoor unlevel surfaces using cane with step through gait pattern in order to improve community mobility.    Time  8    Period  Weeks    Status  Revised    Target Date  08/18/19      PT LONG TERM GOAL #6   Title  --    Baseline  --    Status  --      PT LONG TERM GOAL #7   Title  --    Baseline  --    Status  --            Plan - 07/24/19 1118    Clinical Impression Statement  Pt with less c/o pain along plantar aspect of R foot today, and pt does not c/o foot pain during PT session today.  Addressed dynamic balance and functional strengthening on solid and compliant surfaces, for neuro-reeducation to engage R plantarflexors.  Pt did return demo understanding of most recent updates to HEP; therefore, STG 1 met.  Pt will continue to beneift from further skilled PT towards LTGs.    Personal Factors and Comorbidities  Past/Current Experience;Comorbidity 3+    Comorbidities  spinal cord CVA 01/2019, diabetes mellitus, HTN, PCOS, vitamin D deficiency, sp right (2011) and left (2017) hip replacement, peripheral edema, venous  insufficiency.    Examination-Activity Limitations  Sit;Squat;Stairs;Stand;Transfers;Locomotion Level    Examination-Participation Restrictions  Meal Prep;Driving;Community Activity    Stability/Clinical Decision Making  Evolving/Moderate complexity    Rehab Potential  Good    PT Frequency  3x / week    PT Duration  8 weeks   per recert 16/06/9603, to include this week of therapy   PT Treatment/Interventions  ADLs/Self Care Home Management;DME Instruction;Gait training;Stair training;Functional mobility training;Therapeutic activities;Therapeutic exercise;Balance training;Patient/family education;Neuromuscular re-education;Energy conservation;Aquatic Therapy;Orthotic Fit/Training    PT Next Visit Plan  continue trunk, hip strengthening (esp ABD, but really everything) (Try aerobic step ups?), balance-SLS and on compliant surfaces (eyes closed with head nods/turns), tandem stance with glut  activation; check HR during activity    PT Home Exercise Plan  [id number] - plus seated hamstring stretch and seated forward flexion low back stretch, supine hip flexor stretch    Consulted and Agree with Plan of Care  Patient       Patient will benefit from skilled therapeutic intervention in order to improve the following deficits and impairments:  Abnormal gait, Decreased activity tolerance, Decreased balance, Decreased endurance, Decreased coordination, Decreased mobility, Decreased range of motion, Difficulty walking, Decreased strength, Impaired sensation  Visit Diagnosis: Unsteadiness on feet  Muscle weakness (generalized)     Problem List Patient Active Problem List   Diagnosis Date Noted  . Dysesthesia 03/29/2019  . Gait disturbance 03/29/2019  . Spinal cord infarction ([place]) 02/20/2019  . Cauda equina syndrome ([place]) 02/13/2019  . DM2 (diabetes mellitus, type 2) ([place]) 02/13/2019  . Syncope 02/13/2019  . Essential hypertension 02/13/2019  . Class 3 severe obesity with serious comorbidity and  body mass index (BMI) of 45.0 to 49.9 in adult ([place]) 10/04/2017  . Class 3 severe obesity without serious comorbidity with body mass index (BMI) of 45.0 to 49.9 in adult ([place]) 04/12/2017  . Vitamin D deficiency 04/12/2017  . Other fatigue 12/31/2016  . SOB (shortness of breath) on exertion 12/31/2016  . Type 2 diabetes mellitus without complication, without long-term current use of insulin ([name]) 12/31/2016  . Morbid obesity ([place]) 12/31/2016  . OA (osteoarthritis) of hip 12/02/2015    Oakley Orban W. 07/24/2019, 11:34 AM  Frazier Butt., PT   [facility] [address] [place], [place], [address] Phone: [phone]   Fax:  [phone]  Name: Laura Blackwell MRN: [id number] Date of Birth: [birth year]

## 2019-07-25 ENCOUNTER — Ambulatory Visit: Payer: 59 | Admitting: Physical Therapy

## 2019-07-31 ENCOUNTER — Ambulatory Visit: Payer: 59 | Admitting: Physical Therapy

## 2019-07-31 ENCOUNTER — Other Ambulatory Visit: Payer: Self-pay

## 2019-07-31 DIAGNOSIS — R2681 Unsteadiness on feet: Secondary | ICD-10-CM

## 2019-07-31 DIAGNOSIS — M6281 Muscle weakness (generalized): Secondary | ICD-10-CM

## 2019-07-31 NOTE — Therapy (Signed)
[facility] [address] [place], [place], [address] Phone: [phone]   Fax:  [phone]  Physical Therapy Treatment  Patient Details  Name: Laura Blackwell MRN: [id number] Date of Birth: [birth year] Referring Provider (PT): Carol Ada, MD   Encounter Date: 07/31/2019  PT End of Session - 07/31/19 1214    Visit Number  29    Number of Visits  40   per recert [id number] (number should have been 56 to capture 3x/wk for 8 weeks)   Date for PT Re-Evaluation  08/26/19    Authorization Type  UHC    Authorization - Visit Number  29    Authorization - Number of Visits  60    PT Start Time  [phone]    PT Stop Time  1015    PT Time Calculation (min)  39 min    Equipment Utilized During Treatment  Gait belt    Activity Tolerance  Patient tolerated treatment well    Behavior During Therapy  [facility] for tasks assessed/performed       Past Medical History:  Diagnosis Date  . Arthritis   . Diabetes mellitus without complication ([place])   . Edema    in left leg in 1980s on left ankle   . Hyperlipidemia   . Hypertension   . PCOS (polycystic ovarian syndrome)   . Superficial thrombophlebitis   . Swelling   . Venous insufficiency   . Vitamin D deficiency     Past Surgical History:  Procedure Laterality Date  . BREAST BIOPSY Right   . JOINT REPLACEMENT  2011    right hip   . left ankle surgery     . TOTAL HIP ARTHROPLASTY Left 12/02/2015   Procedure: LEFT TOTAL HIP ARTHROPLASTY ANTERIOR APPROACH;  Surgeon: [name], MD;  Location: [facility];  Service: Orthopedics;  Laterality: Left;    There were no vitals filed for this visit.  Subjective Assessment - 07/31/19 0940    Subjective  Feel like the legs are stronger.  Still dont think I could go to [facility] and stand unaided for a long length of time.    Pertinent History  spinal cord CVA 01/2019, diabetes mellitus, HTN, PCOS, vitamin D deficiency, sp right (2011) and left  (2017) hip replacement, peripheral edema, venous insufficiency.    How long can you walk comfortably?  only household distances, states could not walk around track in therapy gym with cane    Diagnostic tests  MRI showed an abnormal focus in the conus medullaris and lower spinal cord adjacent to T12-L1.    Patient Stated Goals  "wants to be 99.99% better, wants to be the best she can be    Currently in Pain?  No/denies    Pain Onset  More than a month ago                       [facility] Adult PT Treatment/Exercise - 07/31/19 0001      Ambulation/Gait   Ambulation/Gait  Yes    Ambulation/Gait Assistance  5: Supervision    Ambulation/Gait Assistance Details  20   x 6 reps no device   Assistive device  Straight cane;None    Gait Pattern  Step-through pattern;Decreased stride length;Decreased stance time - left;Decreased step length - right;Decreased weight shift to left;Decreased trunk rotation;Narrow base of support;Lateral hip instability    Ambulation Surface  Level;Indoor    Gait Comments  Short distance gait no device in  clinic today.            Balance Exercises - 07/31/19 0946      Balance Exercises: Standing   SLS  Eyes open;Solid surface;Upper extremity support 1;3 reps;10 secs    Other Standing Exercises  Stagger stance forward/back rocking to facilate ankle plantarflexion and dorsiflexion, 2 sets x 10 reps; lateral weigthshifting wtih emphasis on push off through plantarflexion and into more SLS weigthshift, then progress x 10 reps to shift and lift (wide BOS hamstring curls), then progress to rock and reach at cabinet, x 10 reps, 2 sets, each side.  Therapist provides supervision and initial cues.      Standing on ramp incline/decline:  Feet apart, then semitandem positions, EO with head turns x 5, head nods x 5; EC 10 seconds; 2 sets each with min guard assistance. Standing on ramp incline/decline:  Rhythmic stabilization in anterior/posterior directions with  resistance given at shoulders, hips x 5 reps each position.  Cues given for smaller resisted ranges for maintaining balance on incline/decline working on plantar/dorsiflexion.  Standing wave squats:  Mini squats to up on toes, 2 sets x 5 reps, with pt holding heavily through UEs on parallel bars.  Very limited plantarflexion bilaterally to go up on toes.    PT Short Term Goals - 07/24/19 1133      PT SHORT TERM GOAL #1   Title  Patient will be independent with progression ofHEP with focus on strength and balance in order to build upon functional gains in therapy. ALL STGs DUE 07/21/19.    Time  4    Period  Weeks    Status  Achieved    Target Date  07/21/19      PT SHORT TERM GOAL #2   Title  Patient will improve DGI to at least 16/24 for decreased fall risk.    Baseline  DGI 13/24 06/27/2019    Time  4    Period  Weeks    Status  Achieved    Target Date  07/21/19      PT SHORT TERM GOAL #3   Title  Pt will ambulate at least 450 ft in 3 minute walk test, with cane, for improved gait endurance and efficiency.    Baseline  396 ft with cane in 3 minute walk test; 490 ft in 3 minutes, 07/17/2019    Time  4    Period  Weeks    Status  Achieved    Target Date  07/21/19      PT SHORT TERM GOAL #4   Title  Pt will improve TUG score to less than or equal to 13.5 sec for decreased fall risk.    Baseline  15.31 sec 06/23/2019; 07/17/2019; 12.79 sec    Time  4    Period  Weeks    Status  Achieved    Target Date  07/21/19        PT Long Term Goals - 06/27/19 1721      PT LONG TERM GOAL #1   Title  Patient will be independent with final HEP with focus on strength and balance in order to build upon functional gains in therapy. ALL LTGs DUE 08/18/19.    Time  8    Period  Weeks    Status  On-going    Target Date  08/18/19      PT LONG TERM GOAL #2   Title  Pt will improve DGI score to at least 19/24 for decreased  fall risk.    Baseline  DGI 13/24 06/27/2019    Time  8    Period   Weeks    Status  New    Target Date  08/18/19      PT LONG TERM GOAL #3   Title  Pt will improve gait velocity to at least 2.62 ft/sec for improved gait effiency and safety in community.    Baseline  2.4 ft/sec 06/27/2019    Time  8    Period  Weeks    Status  New    Target Date  08/18/19      PT LONG TERM GOAL #4   Title  Pt will improve 3 MWT with cane to at least 550 ft, for improved gait endurance and efficiency.    Baseline  396 ft 06/27/2019    Time  8    Period  Weeks    Status  New    Target Date  08/18/19      PT LONG TERM GOAL #5   Title  Patient will ambulate at least 800'modified independently, over indoor level/outdoor unlevel surfaces using cane with step through gait pattern in order to improve community mobility.    Time  8    Period  Weeks    Status  Revised    Target Date  08/18/19      PT LONG TERM GOAL #6   Title  --    Baseline  --    Status  --      PT LONG TERM GOAL #7   Title  --    Baseline  --    Status  --            Plan - 07/31/19 1223    Clinical Impression Statement  Continued to work on functional strengthening and neuromuscular re-education to hip and ankle musculature.  Pt has initial difficulty with balance on incline/decline surfaces of ramp, and with repetition, pt is able to maintain balance better.  She uses ankle strategy well, with ankle intrinsic musculature appearing to kick in during ramp activities.    Personal Factors and Comorbidities  Past/Current Experience;Comorbidity 3+    Comorbidities  spinal cord CVA 01/2019, diabetes mellitus, HTN, PCOS, vitamin D deficiency, sp right (2011) and left (2017) hip replacement, peripheral edema, venous insufficiency.    Examination-Activity Limitations  Sit;Squat;Stairs;Stand;Transfers;Locomotion Level    Examination-Participation Restrictions  Meal Prep;Driving;Community Activity    Stability/Clinical Decision Making  Evolving/Moderate complexity    Rehab Potential  Good    PT  Frequency  3x / week    PT Duration  8 weeks   per recert [id number], to include this week of therapy   PT Treatment/Interventions  ADLs/Self Care Home Management;DME Instruction;Gait training;Stair training;Functional mobility training;Therapeutic activities;Therapeutic exercise;Balance training;Patient/family education;Neuromuscular re-education;Energy conservation;Aquatic Therapy;Orthotic Fit/Training    PT Next Visit Plan  Continue ramp, compliant surface work to work on ankle plantar/dorsiflexion; SLS and step up/step down activities for hip strengthening; tandem stance, glut activation and strengthening.  Check HR during activity.    PT Home Exercise Plan  [id number] - plus seated hamstring stretch and seated forward flexion low back stretch, supine hip flexor stretch    Consulted and Agree with Plan of Care  Patient       Patient will benefit from skilled therapeutic intervention in order to improve the following deficits and impairments:  Abnormal gait, Decreased activity tolerance, Decreased balance, Decreased endurance, Decreased coordination, Decreased mobility, Decreased range of motion, Difficulty walking, Decreased  strength, Impaired sensation  Visit Diagnosis: Muscle weakness (generalized)  Unsteadiness on feet     Problem List Patient Active Problem List   Diagnosis Date Noted  . Dysesthesia 03/29/2019  . Gait disturbance 03/29/2019  . Spinal cord infarction ([place]) 02/20/2019  . Cauda equina syndrome ([place]) 02/13/2019  . DM2 (diabetes mellitus, type 2) ([place]) 02/13/2019  . Syncope 02/13/2019  . Essential hypertension 02/13/2019  . Class 3 severe obesity with serious comorbidity and body mass index (BMI) of 45.0 to 49.9 in adult ([place]) 10/04/2017  . Class 3 severe obesity without serious comorbidity with body mass index (BMI) of 45.0 to 49.9 in adult ([place]) 04/12/2017  . Vitamin D deficiency 04/12/2017  . Other fatigue 12/31/2016  . SOB (shortness of breath) on exertion  12/31/2016  . Type 2 diabetes mellitus without complication, without long-term current use of insulin ([place]) 12/31/2016  . Morbid obesity ([place]) 12/31/2016  . OA (osteoarthritis) of hip 12/02/2015    Laura Sand W. 07/31/2019, 12:29 PM  Frazier Butt., PT   [facility] [address] [place], [place], [address] Phone: [phone]   Fax:  [phone]  Name: Laura Blackwell MRN: [id number] Date of Birth: [birth year]

## 2019-08-02 ENCOUNTER — Encounter: Payer: Self-pay | Admitting: Physical Therapy

## 2019-08-02 ENCOUNTER — Other Ambulatory Visit: Payer: Self-pay

## 2019-08-02 ENCOUNTER — Ambulatory Visit: Payer: 59 | Attending: Family Medicine | Admitting: Physical Therapy

## 2019-08-02 DIAGNOSIS — R29818 Other symptoms and signs involving the nervous system: Secondary | ICD-10-CM | POA: Insufficient documentation

## 2019-08-02 DIAGNOSIS — M6281 Muscle weakness (generalized): Secondary | ICD-10-CM

## 2019-08-02 DIAGNOSIS — R262 Difficulty in walking, not elsewhere classified: Secondary | ICD-10-CM | POA: Diagnosis present

## 2019-08-02 DIAGNOSIS — R2689 Other abnormalities of gait and mobility: Secondary | ICD-10-CM | POA: Insufficient documentation

## 2019-08-02 DIAGNOSIS — R2681 Unsteadiness on feet: Secondary | ICD-10-CM | POA: Insufficient documentation

## 2019-08-02 NOTE — Therapy (Signed)
[facility] [address] [place], [place], [address] Phone: [phone]   Fax:  [phone]  Physical Therapy Treatment  Patient Details  Name: Laura Blackwell MRN: [id number] Date of Birth: [birth year] Referring Provider (PT): Carol Ada, MD   Encounter Date: 08/02/2019  PT End of Session - 08/02/19 1911    Visit Number  30    Number of Visits  40   per recert [id number] (number should have been 26 to capture 3x/wk for 8 weeks)   Date for PT Re-Evaluation  08/26/19    Authorization Type  [employer]    Authorization - Visit Number  30    Authorization - Number of Visits  60    PT Start Time  1330    PT Stop Time  1415    PT Time Calculation (min)  45 min    Equipment Utilized During Treatment  --   pool noodle, buoyancy cuffs   Activity Tolerance  Patient tolerated treatment well    Behavior During Therapy  [facility] for tasks assessed/performed       Past Medical History:  Diagnosis Date  . Arthritis   . Diabetes mellitus without complication ([place])   . Edema    in left leg in 1980s on left ankle   . Hyperlipidemia   . Hypertension   . PCOS (polycystic ovarian syndrome)   . Superficial thrombophlebitis   . Swelling   . Venous insufficiency   . Vitamin D deficiency     Past Surgical History:  Procedure Laterality Date  . BREAST BIOPSY Right   . JOINT REPLACEMENT  2011    right hip   . left ankle surgery     . TOTAL HIP ARTHROPLASTY Left 12/02/2015   Procedure: LEFT TOTAL HIP ARTHROPLASTY ANTERIOR APPROACH;  Surgeon: [name], MD;  Location: [facility];  Service: Orthopedics;  Laterality: Left;    There were no vitals filed for this visit.  Subjective Assessment - 08/02/19 1910    Subjective  Denies any falls or changes.  Feels like she is improving but legs still feel weak.    Pertinent History  spinal cord CVA 01/2019, diabetes mellitus, HTN, PCOS, vitamin D deficiency, sp right (2011) and left (2017) hip  replacement, peripheral edema, venous insufficiency.    How long can you walk comfortably?  only household distances, states could not walk around track in therapy gym with cane    Diagnostic tests  MRI showed an abnormal focus in the conus medullaris and lower spinal cord adjacent to T12-L1.    Patient Stated Goals  "wants to be 99.99% better, wants to be the best she can be    Currently in Pain?  No/denies    Pain Onset  More than a month ago       Aquatic therapy at [facility] - pool temp 87.6 degrees   Patient seen for aquatic therapy today. Treatment took place in water 3.5-4 feet deep depending upon activity. Pt entered and exited the pool via step negotiation with use of bilateral handrails with close supervision for safety. Pt using cane to ambulate on pool deck to steps. Pt able to go up steps with alternating step today.  Pt performed runner's stretch on each leg and heel cord stretch bil. LE's with feet on pool wall - 30 sec hold each stretch x 1 rep.  Repeated again at end of session. Pt performed amb. 81m across pool 2 reps forward, Side stepping x 2 reps  of 41m. Side step squats 25 m x 2 reps Backward walking 25 m x 2 reps. Worked on increased speed of turns while maintaining balance.  Pt performed standing hip flexion, abduction, hamstring curl and hip extension with knee extended with blue buoyancy cuffs x 20 reps each on bil LE's with 1 UE-intermittent support; Performed 20 reps going from standing to hip flexion back to hip extension with knee flexed then back to standing. Performed squats x 15 at edge of pool for UE support then x 15 single leg squats with UE support.   Marching in place 10 reps each leg without UE support. Standing at pool edge for heel raises x 20 x 2 sets and toe raises x 20 x 1 set.  Pt performed Ai Chi Postures of Enclosing and Soothing without UE support then Rounding with 1 UEsupport.  Performed > 15 reps each.  Stepping forward<>backward with  single leg over colored tile in pool without UE assist x 15 reps each side.  Seated on bench in water and performed 15 reps of sit<>stand with 1 UE -intermittent assist then 15 reps single leg sit<>stand with 1 UE assist.  Cues for forward lean. Pt supine with pool noodle behind her back for bicycling x 50m, hip abd/add x 25 m.  Then in same position with bil feet on pool edge, bending bil knees then pushing away from wall with PTA support behind her back and neck.  Performed x 10 reps. Pt requires aquatic therapy for the unweighting provided by the buoyancy of the water and needs the viscosity for strengthening LE's and trunk ; buoyancy of water needed for support with walking for balance and for safety as pt is at fall risk on land; Viscosity of water needed for resistance for strengthening bil LE    PT Short Term Goals - 07/24/19 1133      PT SHORT TERM GOAL #1   Title  Patient will be independent with progression ofHEP with focus on strength and balance in order to build upon functional gains in therapy. ALL STGs DUE 07/21/19.    Time  4    Period  Weeks    Status  Achieved    Target Date  07/21/19      PT SHORT TERM GOAL #2   Title  Patient will improve DGI to at least 16/24 for decreased fall risk.    Baseline  DGI 13/24 06/27/2019    Time  4    Period  Weeks    Status  Achieved    Target Date  07/21/19      PT SHORT TERM GOAL #3   Title  Pt will ambulate at least 450 ft in 3 minute walk test, with cane, for improved gait endurance and efficiency.    Baseline  396 ft with cane in 3 minute walk test; 490 ft in 3 minutes, 07/17/2019    Time  4    Period  Weeks    Status  Achieved    Target Date  07/21/19      PT SHORT TERM GOAL #4   Title  Pt will improve TUG score to less than or equal to 13.5 sec for decreased fall risk.    Baseline  15.31 sec 06/23/2019; 07/17/2019; 12.79 sec    Time  4    Period  Weeks    Status  Achieved    Target Date  07/21/19        PT Long  Term Goals -  06/27/19 1721      PT LONG TERM GOAL #1   Title  Patient will be independent with final HEP with focus on strength and balance in order to build upon functional gains in therapy. ALL LTGs DUE 08/18/19.    Time  8    Period  Weeks    Status  On-going    Target Date  08/18/19      PT LONG TERM GOAL #2   Title  Pt will improve DGI score to at least 19/24 for decreased fall risk.    Baseline  DGI 13/24 06/27/2019    Time  8    Period  Weeks    Status  New    Target Date  08/18/19      PT LONG TERM GOAL #3   Title  Pt will improve gait velocity to at least 2.62 ft/sec for improved gait effiency and safety in community.    Baseline  2.4 ft/sec 06/27/2019    Time  8    Period  Weeks    Status  New    Target Date  08/18/19      PT LONG TERM GOAL #4   Title  Pt will improve 3 MWT with cane to at least 550 ft, for improved gait endurance and efficiency.    Baseline  396 ft 06/27/2019    Time  8    Period  Weeks    Status  New    Target Date  08/18/19      PT LONG TERM GOAL #5   Title  Patient will ambulate at least 800'modified independently, over indoor level/outdoor unlevel surfaces using cane with step through gait pattern in order to improve community mobility.    Time  8    Period  Weeks    Status  Revised    Target Date  08/18/19      PT LONG TERM GOAL #6   Title  --    Baseline  --    Status  --      PT LONG TERM GOAL #7   Title  --    Baseline  --    Status  --            Plan - 08/02/19 1912    Clinical Impression Statement  Skilled aquatic session focused on strengthening and balance.  Pt's balance in water continues to improve with each session.  Still needs use of side of pool at times with certain activities and has lob at times but recovers without assist.  Continue PT per POC.    Personal Factors and Comorbidities  Past/Current Experience;Comorbidity 3+    Comorbidities  spinal cord CVA 01/2019, diabetes mellitus, HTN, PCOS, vitamin D  deficiency, sp right (2011) and left (2017) hip replacement, peripheral edema, venous insufficiency.    Examination-Activity Limitations  Sit;Squat;Stairs;Stand;Transfers;Locomotion Level    Examination-Participation Restrictions  Meal Prep;Driving;Community Activity    Stability/Clinical Decision Making  Evolving/Moderate complexity    Rehab Potential  Good    PT Frequency  3x / week    PT Duration  8 weeks   per recert [id number], to include this week of therapy   PT Treatment/Interventions  ADLs/Self Care Home Management;DME Instruction;Gait training;Stair training;Functional mobility training;Therapeutic activities;Therapeutic exercise;Balance training;Patient/family education;Neuromuscular re-education;Energy conservation;Aquatic Therapy;Orthotic Fit/Training    PT Next Visit Plan  Continue ramp, compliant surface work to work on ankle plantar/dorsiflexion; SLS and step up/step down activities for hip strengthening; tandem stance, glut activation and strengthening.  Check HR during activity.    PT Home Exercise Plan  [id number] - plus seated hamstring stretch and seated forward flexion low back stretch, supine hip flexor stretch    Consulted and Agree with Plan of Care  Patient       Patient will benefit from skilled therapeutic intervention in order to improve the following deficits and impairments:  Abnormal gait, Decreased activity tolerance, Decreased balance, Decreased endurance, Decreased coordination, Decreased mobility, Decreased range of motion, Difficulty walking, Decreased strength, Impaired sensation  Visit Diagnosis: Muscle weakness (generalized)  Unsteadiness on feet  Difficulty in walking, not elsewhere classified     Problem List Patient Active Problem List   Diagnosis Date Noted  . Dysesthesia 03/29/2019  . Gait disturbance 03/29/2019  . Spinal cord infarction ([place]) 02/20/2019  . Cauda equina syndrome ([place]) 02/13/2019  . DM2 (diabetes mellitus, type 2) ([place])  02/13/2019  . Syncope 02/13/2019  . Essential hypertension 02/13/2019  . Class 3 severe obesity with serious comorbidity and body mass index (BMI) of 45.0 to 49.9 in adult ([place]) 10/04/2017  . Class 3 severe obesity without serious comorbidity with body mass index (BMI) of 45.0 to 49.9 in adult ([place]) 04/12/2017  . Vitamin D deficiency 04/12/2017  . Other fatigue 12/31/2016  . SOB (shortness of breath) on exertion 12/31/2016  . Type 2 diabetes mellitus without complication, without long-term current use of insulin ([place]) 12/31/2016  . Morbid obesity ([place]) 12/31/2016  . OA (osteoarthritis) of hip 12/02/2015    Narda Bonds, PTA [place] 08/02/19 7:44 PM Phone: [phone] Fax: [facility] [address] [place], [place], [address] Phone: [phone]   Fax:  [phone]  Name: Laura Blackwell MRN: [id number] Date of Birth: 1956-06-10

## 2019-08-04 ENCOUNTER — Other Ambulatory Visit: Payer: Self-pay

## 2019-08-04 ENCOUNTER — Ambulatory Visit: Payer: 59 | Admitting: Physical Therapy

## 2019-08-04 DIAGNOSIS — M6281 Muscle weakness (generalized): Secondary | ICD-10-CM

## 2019-08-04 DIAGNOSIS — R2681 Unsteadiness on feet: Secondary | ICD-10-CM

## 2019-08-04 DIAGNOSIS — R29818 Other symptoms and signs involving the nervous system: Secondary | ICD-10-CM

## 2019-08-04 DIAGNOSIS — R262 Difficulty in walking, not elsewhere classified: Secondary | ICD-10-CM

## 2019-08-04 NOTE — Therapy (Signed)
[facility] [address] [place], [place], [address] Phone: [phone]   Fax:  [phone]  Physical Therapy Treatment  Patient Details  Name: Laura Blackwell MRN: [id number] Date of Birth: 19-Dec-1955 Referring Provider (PT): Carol Ada, MD   Encounter Date: 08/04/2019  PT End of Session - 08/04/19 0934    Visit Number  31    Number of Visits  40   per recert [id number] (number should have been 51 to capture 3x/wk for 8 weeks)   Date for PT Re-Evaluation  08/26/19    Authorization Type  UHC    Authorization - Visit Number  31    Authorization - Number of Visits  60    PT Start Time  0848    PT Stop Time  0930    PT Time Calculation (min)  42 min    Equipment Utilized During Treatment  Gait belt   pool noodle, buoyancy cuffs   Activity Tolerance  Patient tolerated treatment well    Behavior During Therapy  WFL for tasks assessed/performed       Past Medical History:  Diagnosis Date  . Arthritis   . Diabetes mellitus without complication ([place])   . Edema    in left leg in 1980s on left ankle   . Hyperlipidemia   . Hypertension   . PCOS (polycystic ovarian syndrome)   . Superficial thrombophlebitis   . Swelling   . Venous insufficiency   . Vitamin D deficiency     Past Surgical History:  Procedure Laterality Date  . BREAST BIOPSY Right   . JOINT REPLACEMENT  2011    right hip   . left ankle surgery     . TOTAL HIP ARTHROPLASTY Left 12/02/2015   Procedure: LEFT TOTAL HIP ARTHROPLASTY ANTERIOR APPROACH;  Surgeon: [name], MD;  Location: [facility];  Service: Orthopedics;  Laterality: Left;    There were no vitals filed for this visit.  Subjective Assessment - 08/04/19 0854    Subjective  Feeling better, loving the pool. Is inquiring about PT paperwork for long term disability. Pt's foot pain is a 3-4/10 - feeling much better.    Pertinent History  spinal cord CVA 01/2019, diabetes mellitus, HTN,  PCOS, vitamin D deficiency, sp right (2011) and left (2017) hip replacement, peripheral edema, venous insufficiency.    How long can you walk comfortably?  only household distances, states could not walk around track in therapy gym with cane    Diagnostic tests  MRI showed an abnormal focus in the conus medullaris and lower spinal cord adjacent to T12-L1.    Patient Stated Goals  "wants to be 99.99% better, wants to be the best she can be    Currently in Pain?  No/denies    Pain Onset  More than a month ago                       [facility] Adult PT Treatment/Exercise - 08/04/19 0001      Ambulation/Gait   Ambulation/Gait  Yes    Ambulation/Gait Assistance  5: Supervision    Ambulation/Gait Assistance Details  20-30   x4 reps throughout session no AD   Assistive device  Straight cane;None    Gait Pattern  Step-through pattern;Decreased stride length;Decreased stance time - left;Decreased step length - right;Decreased weight shift to left;Decreased trunk rotation;Narrow base of support;Lateral hip instability    Ambulation Surface  Level;Indoor      Therapeutic  Activites    Other Therapeutic Activities  Pt bringing long term disability paperwork in at beginning of session asking for all of PT notes. Therapist telling pt that she can log on to her MyChart to print or fax previous sessions and if she can't get it to work to call back our office and someone here will assist. Pt verbalized understanding.      Neuro Re-ed    Neuro Re-ed Details   On incline facing forward: feet together eyes closed 3 x 30 seconds, feet together eyes closed head nods and head turns 3 x 5 reps each. Min guard with one episode of min A for balance.       Exercises   Other Exercises   Standing at countertop with fingertip support: A/P staggered weight shifting on blue mat - with focus on holds with push off into PF and weight shifting into DF 2 x 10 reps. Standing on green oval balance pad with heels off  and toes on pressing down into toes for PF strengthening 2 x 10 reps. Eccentric calf strengthening for midstance -terminal stance/push off during gait, standing in // bars with BUE support and staggered stance with lower extremities in knee flexion and posterior leg straight, focus on bending forward at the hip and lunging forward into anterior lower extremity - with therapist resisting movement with green theraband and coming back up to stand, 1 x 10 reps B. Step ups onto 4" step - 1 x 10 reps B with single UE support in// bars.                PT Short Term Goals - 07/24/19 1133      PT SHORT TERM GOAL #1   Title  Patient will be independent with progression ofHEP with focus on strength and balance in order to build upon functional gains in therapy. ALL STGs DUE 07/21/19.    Time  4    Period  Weeks    Status  Achieved    Target Date  07/21/19      PT SHORT TERM GOAL #2   Title  Patient will improve DGI to at least 16/24 for decreased fall risk.    Baseline  DGI 13/24 06/27/2019    Time  4    Period  Weeks    Status  Achieved    Target Date  07/21/19      PT SHORT TERM GOAL #3   Title  Pt will ambulate at least 450 ft in 3 minute walk test, with cane, for improved gait endurance and efficiency.    Baseline  396 ft with cane in 3 minute walk test; 490 ft in 3 minutes, 07/17/2019    Time  4    Period  Weeks    Status  Achieved    Target Date  07/21/19      PT SHORT TERM GOAL #4   Title  Pt will improve TUG score to less than or equal to 13.5 sec for decreased fall risk.    Baseline  15.31 sec 06/23/2019; 07/17/2019; 12.79 sec    Time  4    Period  Weeks    Status  Achieved    Target Date  07/21/19        PT Long Term Goals - 06/27/19 1721      PT LONG TERM GOAL #1   Title  Patient will be independent with final HEP with focus on strength and balance in order to build  upon functional gains in therapy. ALL LTGs DUE 08/18/19.    Time  8    Period  Weeks    Status   On-going    Target Date  08/18/19      PT LONG TERM GOAL #2   Title  Pt will improve DGI score to at least 19/24 for decreased fall risk.    Baseline  DGI 13/24 06/27/2019    Time  8    Period  Weeks    Status  New    Target Date  08/18/19      PT LONG TERM GOAL #3   Title  Pt will improve gait velocity to at least 2.62 ft/sec for improved gait effiency and safety in community.    Baseline  2.4 ft/sec 06/27/2019    Time  8    Period  Weeks    Status  New    Target Date  08/18/19      PT LONG TERM GOAL #4   Title  Pt will improve 3 MWT with cane to at least 550 ft, for improved gait endurance and efficiency.    Baseline  396 ft 06/27/2019    Time  8    Period  Weeks    Status  New    Target Date  08/18/19      PT LONG TERM GOAL #5   Title  Patient will ambulate at least 800'modified independently, over indoor level/outdoor unlevel surfaces using cane with step through gait pattern in order to improve community mobility.    Time  8    Period  Weeks    Status  Revised    Target Date  08/18/19      PT LONG TERM GOAL #6   Title  --    Baseline  --    Status  --      PT LONG TERM GOAL #7   Title  --    Baseline  --    Status  --            Plan - 08/04/19 1216    Clinical Impression Statement  Skilled session focused on LE strengthening (with focus on PF) and balance. Pt with improvement of balance on inclines today with focus on ankle strategy and PF/DF - with only one episode of min A for balance. Pt improved with increased repetitions. Pt continues to report numbness in LLE - only able to minimally feel L gastroc during PF strengthening activities today. Will continue to progress towards LTGs.    Personal Factors and Comorbidities  Past/Current Experience;Comorbidity 3+    Comorbidities  spinal cord CVA 01/2019, diabetes mellitus, HTN, PCOS, vitamin D deficiency, sp right (2011) and left (2017) hip replacement, peripheral edema, venous insufficiency.     Examination-Activity Limitations  Sit;Squat;Stairs;Stand;Transfers;Locomotion Level    Examination-Participation Restrictions  Meal Prep;Driving;Community Activity    Stability/Clinical Decision Making  Evolving/Moderate complexity    Rehab Potential  Good    PT Frequency  3x / week    PT Duration  8 weeks   per recert [id number], to include this week of therapy   PT Treatment/Interventions  ADLs/Self Care Home Management;DME Instruction;Gait training;Stair training;Functional mobility training;Therapeutic activities;Therapeutic exercise;Balance training;Patient/family education;Neuromuscular re-education;Energy conservation;Aquatic Therapy;Orthotic Fit/Training    PT Next Visit Plan  Continue ramp, compliant surface work to work on ankle plantar/dorsiflexion; SLS and step up/step down activities for hip strengthening; tandem stance, glut activation and strengthening.  Check HR during activity.    PT Home Exercise  Plan  [id number] - plus seated hamstring stretch and seated forward flexion low back stretch, supine hip flexor stretch    Consulted and Agree with Plan of Care  Patient       Patient will benefit from skilled therapeutic intervention in order to improve the following deficits and impairments:  Abnormal gait, Decreased activity tolerance, Decreased balance, Decreased endurance, Decreased coordination, Decreased mobility, Decreased range of motion, Difficulty walking, Decreased strength, Impaired sensation  Visit Diagnosis: Muscle weakness (generalized)  Unsteadiness on feet  Difficulty in walking, not elsewhere classified  Other symptoms and signs involving the nervous system     Problem List Patient Active Problem List   Diagnosis Date Noted  . Dysesthesia 03/29/2019  . Gait disturbance 03/29/2019  . Spinal cord infarction ([place]) 02/20/2019  . Cauda equina syndrome ([place]) 02/13/2019  . DM2 (diabetes mellitus, type 2) ([place]) 02/13/2019  . Syncope 02/13/2019  . Essential  hypertension 02/13/2019  . Class 3 severe obesity with serious comorbidity and body mass index (BMI) of 45.0 to 49.9 in adult ([place]) 10/04/2017  . Class 3 severe obesity without serious comorbidity with body mass index (BMI) of 45.0 to 49.9 in adult ([place]) 04/12/2017  . Vitamin D deficiency 04/12/2017  . Other fatigue 12/31/2016  . SOB (shortness of breath) on exertion 12/31/2016  . Type 2 diabetes mellitus without complication, without long-term current use of insulin ([place]) 12/31/2016  . Morbid obesity ([place]) 12/31/2016  . OA (osteoarthritis) of hip 12/02/2015    Arliss Journey, PT, DPT  08/04/2019, 12:25 PM  [facility] [address] [place], [place], [address] Phone: [phone]   Fax:  [phone]  Name: Laura Blackwell MRN: [id number] Date of Birth: Apr 24, 1956

## 2019-08-07 ENCOUNTER — Other Ambulatory Visit: Payer: Self-pay

## 2019-08-07 ENCOUNTER — Encounter: Payer: Self-pay | Admitting: Physical Therapy

## 2019-08-07 ENCOUNTER — Ambulatory Visit: Payer: 59 | Admitting: Physical Therapy

## 2019-08-07 DIAGNOSIS — M6281 Muscle weakness (generalized): Secondary | ICD-10-CM | POA: Diagnosis not present

## 2019-08-07 DIAGNOSIS — R2681 Unsteadiness on feet: Secondary | ICD-10-CM

## 2019-08-07 DIAGNOSIS — R29818 Other symptoms and signs involving the nervous system: Secondary | ICD-10-CM

## 2019-08-07 DIAGNOSIS — R262 Difficulty in walking, not elsewhere classified: Secondary | ICD-10-CM

## 2019-08-07 NOTE — Therapy (Signed)
[facility] [address] [place], [place], [address] Phone: [phone]   Fax:  [phone]  Physical Therapy Treatment  Patient Details  Name: Laura Blackwell MRN: [id number] Date of Birth: 01/06/1956 Referring Provider (PT): Carol Ada, MD   Encounter Date: 08/07/2019  PT End of Session - 08/07/19 0856    Visit Number  32    Number of Visits  40   per recert [id number] (number should have been 24 to capture 3x/wk for 8 weeks)   Date for PT Re-Evaluation  08/26/19    Authorization Type  U[employer]    Authorization - Visit Number  60    Authorization - Number of Visits  60    PT Start Time  0808    PT Stop Time  0852    PT Time Calculation (min)  44 min    Equipment Utilized During Treatment  Gait belt   pool noodle, buoyancy cuffs   Activity Tolerance  Patient tolerated treatment well    Behavior During Therapy  [facility] for tasks assessed/performed       Past Medical History:  Diagnosis Date  . Arthritis   . Diabetes mellitus without complication ([place])   . Edema    in left leg in 1980s on left ankle   . Hyperlipidemia   . Hypertension   . PCOS (polycystic ovarian syndrome)   . Superficial thrombophlebitis   . Swelling   . Venous insufficiency   . Vitamin D deficiency     Past Surgical History:  Procedure Laterality Date  . BREAST BIOPSY Right   . JOINT REPLACEMENT  2011    right hip   . left ankle surgery     . TOTAL HIP ARTHROPLASTY Left 12/02/2015   Procedure: LEFT TOTAL HIP ARTHROPLASTY ANTERIOR APPROACH;  Surgeon: [name], MD;  Location: [facility];  Service: Orthopedics;  Laterality: Left;    There were no vitals filed for this visit.  Subjective Assessment - 08/07/19 0809    Subjective  Feeling good - exercises are going well at home. They still feel a little challenging. Pt's foot pain is about a 3-4/10, worse when she is wearing her sneakers. Still needs to make an appointment at [facility]  for a new pair of shoes.    Pertinent History  spinal cord CVA 01/2019, diabetes mellitus, HTN, PCOS, vitamin D deficiency, sp right (2011) and left (2017) hip replacement, peripheral edema, venous insufficiency.    How long can you walk comfortably?  only household distances, states could not walk around track in therapy gym with cane    Diagnostic tests  MRI showed an abnormal focus in the conus medullaris and lower spinal cord adjacent to T12-L1.    Patient Stated Goals  "wants to be 99.99% better, wants to be the best she can be    Currently in Pain?  Yes    Pain Score  3     Pain Location  Foot    Pain Orientation  Right    Pain Descriptors / Indicators  Aching    Pain Onset  More than a month ago                   NMR: Standing on incline Ascending: feet together eyes closed 3 x 30 seconds, wide tandem stance B with RLE posteriorly eyes closed 3 x 30 seconds, with LLE posteriorly eyes open x20 second with 10 second bout of eyes closed - 3 reps  Descending: feet together eyes closed 2 x 30 seconds, with eyes closed 3 x 5 head nods, 3 x 5 reps head turns  On large rocker board: A/P weight shifting with focus on hip strategy 1 x 15 reps with BUE support, without UE support static holds on rocker board with 2 x 10 reps head turns, 2 x 10 reps head nods, pt needing min guard for balance expecially with head nods.  On blue foam:  -wide tandem stance stagger stance weight shift with BUE support on bars, 1 x 15 reps B, cues for slowed movement.  -6" step taps with single UE support - 1 x10 reps, cues for slowed movement - Harder to lift left leg up when right hand is holding onto rail -wide tandem stance, no UE support 2 x 30 seconds with LLE posteriorly               PT Short Term Goals - 07/24/19 1133      PT SHORT TERM GOAL #1   Title  Patient will be independent with progression ofHEP with focus on strength and balance in order to build upon functional gains in  therapy. ALL STGs DUE 07/21/19.    Time  4    Period  Weeks    Status  Achieved    Target Date  07/21/19      PT SHORT TERM GOAL #2   Title  Patient will improve DGI to at least 16/24 for decreased fall risk.    Baseline  DGI 13/24 06/27/2019    Time  4    Period  Weeks    Status  Achieved    Target Date  07/21/19      PT SHORT TERM GOAL #3   Title  Pt will ambulate at least 450 ft in 3 minute walk test, with cane, for improved gait endurance and efficiency.    Baseline  396 ft with cane in 3 minute walk test; 490 ft in 3 minutes, 07/17/2019    Time  4    Period  Weeks    Status  Achieved    Target Date  07/21/19      PT SHORT TERM GOAL #4   Title  Pt will improve TUG score to less than or equal to 13.5 sec for decreased fall risk.    Baseline  15.31 sec 06/23/2019; 07/17/2019; 12.79 sec    Time  4    Period  Weeks    Status  Achieved    Target Date  07/21/19        PT Long Term Goals - 06/27/19 1721      PT LONG TERM GOAL #1   Title  Patient will be independent with final HEP with focus on strength and balance in order to build upon functional gains in therapy. ALL LTGs DUE 08/18/19.    Time  8    Period  Weeks    Status  On-going    Target Date  08/18/19      PT LONG TERM GOAL #2   Title  Pt will improve DGI score to at least 19/24 for decreased fall risk.    Baseline  DGI 13/24 06/27/2019    Time  8    Period  Weeks    Status  New    Target Date  08/18/19      PT LONG TERM GOAL #3   Title  Pt will improve gait velocity to at least 2.62 ft/sec for improved  gait effiency and safety in community.    Baseline  2.4 ft/sec 06/27/2019    Time  8    Period  Weeks    Status  New    Target Date  08/18/19      PT LONG TERM GOAL #4   Title  Pt will improve 3 MWT with cane to at least 550 ft, for improved gait endurance and efficiency.    Baseline  396 ft 06/27/2019    Time  8    Period  Weeks    Status  New    Target Date  08/18/19      PT LONG TERM GOAL #5    Title  Patient will ambulate at least 800'modified independently, over indoor level/outdoor unlevel surfaces using cane with step through gait pattern in order to improve community mobility.    Time  8    Period  Weeks    Status  Revised    Target Date  08/18/19      PT LONG TERM GOAL #6   Title  --    Baseline  --    Status  --      PT LONG TERM GOAL #7   Title  --    Baseline  --    Status  --            Plan - 08/07/19 0857    Clinical Impression Statement  Today's skilled session focused on neuro re-ed with heavy focus on ankle strategy (DF/PF activation) and hip strategy for balance. Pt with increased difficulty when performing modified tandem stance with LLE posteriorly due to increased numbness and decreased somatosensory inputs. Pt required min guard for balance throughout session, esp with balance activities with head turns. Pt is progressing well - will continue to progress towards LTGs.    Personal Factors and Comorbidities  Past/Current Experience;Comorbidity 3+    Comorbidities  spinal cord CVA 01/2019, diabetes mellitus, HTN, PCOS, vitamin D deficiency, sp right (2011) and left (2017) hip replacement, peripheral edema, venous insufficiency.    Examination-Activity Limitations  Sit;Squat;Stairs;Stand;Transfers;Locomotion Level    Examination-Participation Restrictions  Meal Prep;Driving;Community Activity    Stability/Clinical Decision Making  Evolving/Moderate complexity    Rehab Potential  Good    PT Frequency  3x / week    PT Duration  8 weeks   per recert [id number], to include this week of therapy   PT Treatment/Interventions  ADLs/Self Care Home Management;DME Instruction;Gait training;Stair training;Functional mobility training;Therapeutic activities;Therapeutic exercise;Balance training;Patient/family education;Neuromuscular re-education;Energy conservation;Aquatic Therapy;Orthotic Fit/Training    PT Next Visit Plan  Continue ramp, compliant surface work to work  on ankle plantar/dorsiflexion and other balance strategies on compliant surfaces; gait training without an AD,  SLS and step up/step down activities for hip strengthening; tandem stance, glut activation and strengthening.  Check HR during activity.    PT Home Exercise Plan  [id number] - plus seated hamstring stretch and seated forward flexion low back stretch, supine hip flexor stretch    Consulted and Agree with Plan of Care  Patient       Patient will benefit from skilled therapeutic intervention in order to improve the following deficits and impairments:  Abnormal gait, Decreased activity tolerance, Decreased balance, Decreased endurance, Decreased coordination, Decreased mobility, Decreased range of motion, Difficulty walking, Decreased strength, Impaired sensation  Visit Diagnosis: Muscle weakness (generalized)  Unsteadiness on feet  Difficulty in walking, not elsewhere classified  Other symptoms and signs involving the nervous system     Problem List  Patient Active Problem List   Diagnosis Date Noted  . Dysesthesia 03/29/2019  . Gait disturbance 03/29/2019  . Spinal cord infarction ([place]) 02/20/2019  . Cauda equina syndrome ([place]) 02/13/2019  . DM2 (diabetes mellitus, type 2) ([place]) 02/13/2019  . Syncope 02/13/2019  . Essential hypertension 02/13/2019  . Class 3 severe obesity with serious comorbidity and body mass index (BMI) of 45.0 to 49.9 in adult ([place]) 10/04/2017  . Class 3 severe obesity without serious comorbidity with body mass index (BMI) of 45.0 to 49.9 in adult ([place]) 04/12/2017  . Vitamin D deficiency 04/12/2017  . Other fatigue 12/31/2016  . SOB (shortness of breath) on exertion 12/31/2016  . Type 2 diabetes mellitus without complication, without long-term current use of insulin ([place]) 12/31/2016  . Morbid obesity ([place]) 12/31/2016  . OA (osteoarthritis) of hip 12/02/2015    Arliss Journey, PT, DPT  08/07/2019, 9:00 AM  [facility] [address] [place], [place], [address] Phone: [phone]   Fax:  [phone]  Name: ARMELIA GROVEN MRN: [id number] Date of Birth: 12/06/1955

## 2019-08-09 ENCOUNTER — Other Ambulatory Visit: Payer: Self-pay

## 2019-08-09 ENCOUNTER — Ambulatory Visit: Payer: 59 | Admitting: Physical Therapy

## 2019-08-09 ENCOUNTER — Encounter: Payer: Self-pay | Admitting: Physical Therapy

## 2019-08-09 DIAGNOSIS — R262 Difficulty in walking, not elsewhere classified: Secondary | ICD-10-CM

## 2019-08-09 DIAGNOSIS — R29818 Other symptoms and signs involving the nervous system: Secondary | ICD-10-CM

## 2019-08-09 DIAGNOSIS — M6281 Muscle weakness (generalized): Secondary | ICD-10-CM | POA: Diagnosis not present

## 2019-08-09 DIAGNOSIS — R2681 Unsteadiness on feet: Secondary | ICD-10-CM

## 2019-08-09 NOTE — Therapy (Signed)
[facility] [address] [place], [place], [address] Phone: [phone]   Fax:  [phone]  Physical Therapy Treatment  Patient Details  Name: Laura Blackwell MRN: [id number] Date of Birth: 1955/11/07 Referring Provider (PT): Carol Ada, MD   Encounter Date: 08/09/2019  PT End of Session - 08/09/19 1036    Visit Number  33    Number of Visits  40   per recert [id number] (number should have been 25 to capture 3x/wk for 8 weeks)   Date for PT Re-Evaluation  08/26/19    Authorization Type  UHC    Authorization - Visit Number  33    Authorization - Number of Visits  60    PT Start Time  0933    PT Stop Time  1016    PT Time Calculation (min)  43 min    Equipment Utilized During Treatment  Gait belt   pool noodle, buoyancy cuffs   Activity Tolerance  Patient tolerated treatment well    Behavior During Therapy  WFL for tasks assessed/performed       Past Medical History:  Diagnosis Date  . Arthritis   . Diabetes mellitus without complication ([place])   . Edema    in left leg in 1980s on left ankle   . Hyperlipidemia   . Hypertension   . PCOS (polycystic ovarian syndrome)   . Superficial thrombophlebitis   . Swelling   . Venous insufficiency   . Vitamin D deficiency     Past Surgical History:  Procedure Laterality Date  . BREAST BIOPSY Right   . JOINT REPLACEMENT  2011    right hip   . left ankle surgery     . TOTAL HIP ARTHROPLASTY Left 12/02/2015   Procedure: LEFT TOTAL HIP ARTHROPLASTY ANTERIOR APPROACH;  Surgeon: [name], MD;  Location: [facility];  Service: Orthopedics;  Laterality: Left;    There were no vitals filed for this visit.  Subjective Assessment - 08/09/19 0937    Subjective  Feeling good. Got a new pair of sneakers from [employer], foot is feeling good. Not having any pain.    Pertinent History  spinal cord CVA 01/2019, diabetes mellitus, HTN, PCOS, vitamin D deficiency, sp right  (2011) and left (2017) hip replacement, peripheral edema, venous insufficiency.    How long can you walk comfortably?  only household distances, states could not walk around track in therapy gym with cane    Diagnostic tests  MRI showed an abnormal focus in the conus medullaris and lower spinal cord adjacent to T12-L1.    Patient Stated Goals  "wants to be 99.99% better, wants to be the best she can be    Currently in Pain?  No/denies    Pain Onset  More than a month ago                       [facility] Adult PT Treatment/Exercise - 08/09/19 1045      Ambulation/Gait   Ambulation/Gait  Yes    Ambulation/Gait Assistance  5: Supervision    Ambulation/Gait Assistance Details  no AD today between exercises in clinic    Ambulation Distance (Feet)  --   2 x 50   Assistive device  None    Gait Pattern  Step-through pattern;Decreased stride length;Decreased stance time - left;Decreased step length - right;Decreased weight shift to left;Decreased trunk rotation;Narrow base of support;Lateral hip instability    Ambulation Surface  Level;Indoor  Neuro Re-ed    Neuro Re-ed Details   Standing on foam balance beam: forward tandem walking down and back 3 reps with single UE support, wider BOS: head nods 2 x 10 reps, head turns 2 x 10 reps, min guard for balance. Multi-directional ball toss with therapist approx.. 5' feet away, multiple reps - cues for pt to look towards ball when catching. Pt with a few LOB but able to maintain balance by grabbing // bars.       Exercises   Other Exercises   SciFit with BUE and BLE for strengthening, ROM, activity endurance, and activity tolerance, level 3.5 for 5 minutes. Attempted tall kneeling on mat table - pt reporting too much knee discomfort today to perform. At edge of mat on blue foam with wider BOS and green theraband around distal thighs: 4 x 5 reps sit <> stands with BUE reaching forwards, cues for glute activation in standing.                 PT Short Term Goals - 07/24/19 1133      PT SHORT TERM GOAL #1   Title  Patient will be independent with progression ofHEP with focus on strength and balance in order to build upon functional gains in therapy. ALL STGs DUE 07/21/19.    Time  4    Period  Weeks    Status  Achieved    Target Date  07/21/19      PT SHORT TERM GOAL #2   Title  Patient will improve DGI to at least 16/24 for decreased fall risk.    Baseline  DGI 13/24 06/27/2019    Time  4    Period  Weeks    Status  Achieved    Target Date  07/21/19      PT SHORT TERM GOAL #3   Title  Pt will ambulate at least 450 ft in 3 minute walk test, with cane, for improved gait endurance and efficiency.    Baseline  396 ft with cane in 3 minute walk test; 490 ft in 3 minutes, 07/17/2019    Time  4    Period  Weeks    Status  Achieved    Target Date  07/21/19      PT SHORT TERM GOAL #4   Title  Pt will improve TUG score to less than or equal to 13.5 sec for decreased fall risk.    Baseline  15.31 sec 06/23/2019; 07/17/2019; 12.79 sec    Time  4    Period  Weeks    Status  Achieved    Target Date  07/21/19        PT Long Term Goals - 06/27/19 1721      PT LONG TERM GOAL #1   Title  Patient will be independent with final HEP with focus on strength and balance in order to build upon functional gains in therapy. ALL LTGs DUE 08/18/19.    Time  8    Period  Weeks    Status  On-going    Target Date  08/18/19      PT LONG TERM GOAL #2   Title  Pt will improve DGI score to at least 19/24 for decreased fall risk.    Baseline  DGI 13/24 06/27/2019    Time  8    Period  Weeks    Status  New    Target Date  08/18/19      PT LONG TERM  GOAL #3   Title  Pt will improve gait velocity to at least 2.62 ft/sec for improved gait effiency and safety in community.    Baseline  2.4 ft/sec 06/27/2019    Time  8    Period  Weeks    Status  New    Target Date  08/18/19      PT LONG TERM GOAL #4   Title  Pt  will improve 3 MWT with cane to at least 550 ft, for improved gait endurance and efficiency.    Baseline  396 ft 06/27/2019    Time  8    Period  Weeks    Status  New    Target Date  08/18/19      PT LONG TERM GOAL #5   Title  Patient will ambulate at least 800'modified independently, over indoor level/outdoor unlevel surfaces using cane with step through gait pattern in order to improve community mobility.    Time  8    Period  Weeks    Status  Revised    Target Date  08/18/19      PT LONG TERM GOAL #6   Title  --    Baseline  --    Status  --      PT LONG TERM GOAL #7   Title  --    Baseline  --    Status  --            Plan - 08/09/19 1037    Clinical Impression Statement  Today's skilled session focused on LE strengthening, activity tolerance, and balance strategies (focus on ankle). Pt continues to have increased difficulty performing up/down head nods while on compliant surfaces, during 10 reps, has approx. 2-3 episodes of losing balance posteriorly, but is able to catch herself on // bars. Pt's HR after activity today remained between 110-120 bpm. Will continue to progress towards LTGs.    Personal Factors and Comorbidities  Past/Current Experience;Comorbidity 3+    Comorbidities  spinal cord CVA 01/2019, diabetes mellitus, HTN, PCOS, vitamin D deficiency, sp right (2011) and left (2017) hip replacement, peripheral edema, venous insufficiency.    Examination-Activity Limitations  Sit;Squat;Stairs;Stand;Transfers;Locomotion Level    Examination-Participation Restrictions  Meal Prep;Driving;Community Activity    Stability/Clinical Decision Making  Evolving/Moderate complexity    Rehab Potential  Good    PT Frequency  3x / week    PT Duration  8 weeks   per recert [id number], to include this week of therapy   PT Treatment/Interventions  ADLs/Self Care Home Management;DME Instruction;Gait training;Stair training;Functional mobility training;Therapeutic  activities;Therapeutic exercise;Balance training;Patient/family education;Neuromuscular re-education;Energy conservation;Aquatic Therapy;Orthotic Fit/Training    PT Next Visit Plan  LTGs due 12/18 - will need more visits scheduled through Jan. Continue ramp, compliant surface work to work on ankle plantar/dorsiflexion and other balance strategies on compliant surfaces; gait training without an AD,  trunk/core strengthening, SLS and step up/step down activities for hip strengthening.  Check HR during activity.    PT Home Exercise Plan  [id number] - plus seated hamstring stretch and seated forward flexion low back stretch, supine hip flexor stretch    Consulted and Agree with Plan of Care  Patient       Patient will benefit from skilled therapeutic intervention in order to improve the following deficits and impairments:  Abnormal gait, Decreased activity tolerance, Decreased balance, Decreased endurance, Decreased coordination, Decreased mobility, Decreased range of motion, Difficulty walking, Decreased strength, Impaired sensation  Visit Diagnosis: Muscle weakness (generalized)  Unsteadiness on  feet  Difficulty in walking, not elsewhere classified  Other symptoms and signs involving the nervous system     Problem List Patient Active Problem List   Diagnosis Date Noted  . Dysesthesia 03/29/2019  . Gait disturbance 03/29/2019  . Spinal cord infarction ([place]) 02/20/2019  . Cauda equina syndrome ([place]) 02/13/2019  . DM2 (diabetes mellitus, type 2) ([place]) 02/13/2019  . Syncope 02/13/2019  . Essential hypertension 02/13/2019  . Class 3 severe obesity with serious comorbidity and body mass index (BMI) of 45.0 to 49.9 in adult ([place]) 10/04/2017  . Class 3 severe obesity without serious comorbidity with body mass index (BMI) of 45.0 to 49.9 in adult ([place]) 04/12/2017  . Vitamin D deficiency 04/12/2017  . Other fatigue 12/31/2016  . SOB (shortness of breath) on exertion 12/31/2016  . Type 2  diabetes mellitus without complication, without long-term current use of insulin ([place]) 12/31/2016  . Morbid obesity ([place]) 12/31/2016  . OA (osteoarthritis) of hip 12/02/2015    Arliss Journey, PT, DPT  08/09/2019, 10:46 AM  [facility] [address] [place], [place], [address] Phone: [phone]   Fax:  [phone]  Name: Laura Blackwell MRN: [id number] Date of Birth: 02-Jul-1956

## 2019-08-11 ENCOUNTER — Other Ambulatory Visit: Payer: Self-pay

## 2019-08-11 ENCOUNTER — Encounter: Payer: Self-pay | Admitting: Physical Therapy

## 2019-08-11 ENCOUNTER — Ambulatory Visit: Payer: 59 | Admitting: Physical Therapy

## 2019-08-11 DIAGNOSIS — R29818 Other symptoms and signs involving the nervous system: Secondary | ICD-10-CM

## 2019-08-11 DIAGNOSIS — M6281 Muscle weakness (generalized): Secondary | ICD-10-CM

## 2019-08-11 DIAGNOSIS — R2681 Unsteadiness on feet: Secondary | ICD-10-CM

## 2019-08-11 DIAGNOSIS — R262 Difficulty in walking, not elsewhere classified: Secondary | ICD-10-CM

## 2019-08-12 NOTE — Therapy (Signed)
[facility] [address] [place], [place], [address] Phone: [phone]   Fax:  [phone]  Physical Therapy Treatment  Patient Details  Name: Laura Blackwell MRN: [id number] Date of Birth: 1956/03/18 Referring Provider (PT): Carol Ada, MD   Encounter Date: 08/11/2019   08/11/19 0938  PT Visits / Re-Eval  Visit Number 34  Number of Visits 40 (per recert [id number] (number should have been 72 to capture 3x/wk for 8 weeks))  Date for PT Re-Evaluation 08/26/19  Authorization  Authorization Type UHC  Authorization - Visit Number 48  Authorization - Number of Visits 60  PT Time Calculation  PT Start Time 0932  PT Stop Time 1015  PT Time Calculation (min) 43 min  PT - End of Session  Equipment Utilized During Treatment Gait belt  Activity Tolerance Patient tolerated treatment well  Behavior During Therapy [facility] for tasks assessed/performed     Past Medical History:  Diagnosis Date  . Arthritis   . Diabetes mellitus without complication ([place])   . Edema    in left leg in 1980s on left ankle   . Hyperlipidemia   . Hypertension   . PCOS (polycystic ovarian syndrome)   . Superficial thrombophlebitis   . Swelling   . Venous insufficiency   . Vitamin D deficiency     Past Surgical History:  Procedure Laterality Date  . BREAST BIOPSY Right   . JOINT REPLACEMENT  2011    right hip   . left ankle surgery     . TOTAL HIP ARTHROPLASTY Left 12/02/2015   Procedure: LEFT TOTAL HIP ARTHROPLASTY ANTERIOR APPROACH;  Surgeon: [name], MD;  Location: [facility];  Service: Orthopedics;  Laterality: Left;    There were no vitals filed for this visit.     08/11/19 0936  Symptoms/Limitations  Subjective No new complaints. No falls or pain to report. Loving her new shoes.  Pertinent History spinal cord CVA 01/2019, diabetes mellitus, HTN, PCOS, vitamin D deficiency, sp right (2011) and left (2017) hip replacement,  peripheral edema, venous insufficiency.  How long can you walk comfortably? only household distances, states could not walk around track in therapy gym with cane  Diagnostic tests MRI showed an abnormal focus in the conus medullaris and lower spinal cord adjacent to T12-L1.  Patient Stated Goals "wants to be 99.99% better, wants to be the best she can be      08/11/19 0941  Ambulation/Gait  Ambulation/Gait Yes  Ambulation/Gait Assistance 6: Modified independent (Device/Increase time);5: Supervision  Ambulation/Gait Assistance Details Mod I with cane into/out of gym. use of on device in session with supervision to min guard assist when fatigued.   Ambulation Distance (Feet)  (around gym with session. )  Assistive device None;Straight cane  Gait Pattern Step-through pattern;Decreased stride length;Decreased stance time - left;Decreased step length - right;Decreased weight shift to left;Decreased trunk rotation;Narrow base of support;Lateral hip instability  Ambulation Surface Level;Indoor  High Level Balance  High Level Balance Activities Side stepping;Marching forwards;Marching backwards;Tandem walking (tandem fwd/bwd)  High Level Balance Comments on blue mat in parallel bars: 4-5 laps each with cues on form/technique. min guard assist for balance.   Self-Care  Self-Care Other Self-Care Comments  Other Self-Care Comments  pt with reports of increased sensations in her LE's. Feeling clothing, touch, etc more with irritation at times. Advised pt to follow up with her MD if it continues to bother her and keep her up at night.  Knee/Hip Exercises: Aerobic  Other Aerobic Scifit UE/LE's on level 3.0 for 6 minutes with goal >/=80 rpm for strengthening and activity tolerance. HR 92 before,   Knee/Hip Exercises: Standing  Other Standing Knee Exercises seated with green band around knees: sit<>stands with no UE support, emphasis on tall posture and slow, controlled descent.;  standing with green band 4 way hip kicks for 10 reps each with left LE (right stance).        08/11/19 1004  Balance Exercises: Standing  Other Standing Exercises on inverted BOSU- single leg stance in center with contralateral LE kicks fwd/lateral/bwd for 2 sets of 5 reps each side, bil UE support, min guard assit with cues on form/technique.        PT Short Term Goals - 07/24/19 1133      PT SHORT TERM GOAL #1   Title  Patient will be independent with progression ofHEP with focus on strength and balance in order to build upon functional gains in therapy. ALL STGs DUE 07/21/19.    Time  4    Period  Weeks    Status  Achieved    Target Date  07/21/19      PT SHORT TERM GOAL #2   Title  Patient will improve DGI to at least 16/24 for decreased fall risk.    Baseline  DGI 13/24 06/27/2019    Time  4    Period  Weeks    Status  Achieved    Target Date  07/21/19      PT SHORT TERM GOAL #3   Title  Pt will ambulate at least 450 ft in 3 minute walk test, with cane, for improved gait endurance and efficiency.    Baseline  396 ft with cane in 3 minute walk test; 490 ft in 3 minutes, 07/17/2019    Time  4    Period  Weeks    Status  Achieved    Target Date  07/21/19      PT SHORT TERM GOAL #4   Title  Pt will improve TUG score to less than or equal to 13.5 sec for decreased fall risk.    Baseline  15.31 sec 06/23/2019; 07/17/2019; 12.79 sec    Time  4    Period  Weeks    Status  Achieved    Target Date  07/21/19        PT Long Term Goals - 06/27/19 1721      PT LONG TERM GOAL #1   Title  Patient will be independent with final HEP with focus on strength and balance in order to build upon functional gains in therapy. ALL LTGs DUE 08/18/19.    Time  8    Period  Weeks    Status  On-going    Target Date  08/18/19      PT LONG TERM GOAL #2   Title  Pt will improve DGI score to at least 19/24 for decreased fall risk.    Baseline  DGI 13/24 06/27/2019    Time  8    Period   Weeks    Status  New    Target Date  08/18/19      PT LONG TERM GOAL #3   Title  Pt will improve gait velocity to at least 2.62 ft/sec for improved gait effiency and safety in community.    Baseline  2.4 ft/sec 06/27/2019    Time  8    Period  Weeks  Status  New    Target Date  08/18/19      PT LONG TERM GOAL #4   Title  Pt will improve 3 MWT with cane to at least 550 ft, for improved gait endurance and efficiency.    Baseline  396 ft 06/27/2019    Time  8    Period  Weeks    Status  New    Target Date  08/18/19      PT LONG TERM GOAL #5   Title  Patient will ambulate at least 800'modified independently, over indoor level/outdoor unlevel surfaces using cane with step through gait pattern in order to improve community mobility.    Time  8    Period  Weeks    Status  Revised    Target Date  08/18/19      PT LONG TERM GOAL #6   Title  --    Baseline  --    Status  --      PT LONG TERM GOAL #7   Title  --    Baseline  --    Status  --          08/11/19 0938  Plan  Clinical Impression Statement Today's skilled session continued to address LE strengthening and balance reactions with rest breaks taken throughout the session. HR max 126 with activity with recovery to high 90's with rest breaks. The pt is making steady progress toward goals and should benefit from continued PT to progress toward unmet goals.  Personal Factors and Comorbidities Past/Current Experience;Comorbidity 3+  Comorbidities spinal cord CVA 01/2019, diabetes mellitus, HTN, PCOS, vitamin D deficiency, sp right (2011) and left (2017) hip replacement, peripheral edema, venous insufficiency.  Examination-Activity Limitations Sit;Squat;Stairs;Stand;Transfers;Locomotion Level  Examination-Participation Restrictions Meal Prep;Driving;Community Activity  Pt will benefit from skilled therapeutic intervention in order to improve on the following deficits Abnormal gait;Decreased activity tolerance;Decreased  balance;Decreased endurance;Decreased coordination;Decreased mobility;Decreased range of motion;Difficulty walking;Decreased strength;Impaired sensation  Stability/Clinical Decision Making Evolving/Moderate complexity  Rehab Potential Good  PT Frequency 3x / week  PT Duration 8 weeks (per recert [id number], to include this week of therapy)  PT Treatment/Interventions ADLs/Self Care Home Management;DME Instruction;Gait training;Stair training;Functional mobility training;Therapeutic activities;Therapeutic exercise;Balance training;Patient/family education;Neuromuscular re-education;Energy conservation;Aquatic Therapy;Orthotic Fit/Training  PT Next Visit Plan LTGs due 12/18 - check goals for recert.  PT Home Exercise Plan [id number] - plus seated hamstring stretch and seated forward flexion low back stretch, supine hip flexor stretch  Consulted and Agree with Plan of Care Patient         Patient will benefit from skilled therapeutic intervention in order to improve the following deficits and impairments:  Abnormal gait, Decreased activity tolerance, Decreased balance, Decreased endurance, Decreased coordination, Decreased mobility, Decreased range of motion, Difficulty walking, Decreased strength, Impaired sensation  Visit Diagnosis: Muscle weakness (generalized)  Unsteadiness on feet  Difficulty in walking, not elsewhere classified  Other symptoms and signs involving the nervous system     Problem List Patient Active Problem List   Diagnosis Date Noted  . Dysesthesia 03/29/2019  . Gait disturbance 03/29/2019  . Spinal cord infarction ([place]) 02/20/2019  . Cauda equina syndrome ([place]) 02/13/2019  . DM2 (diabetes mellitus, type 2) ([place]) 02/13/2019  . Syncope 02/13/2019  . Essential hypertension 02/13/2019  . Class 3 severe obesity with serious comorbidity and body mass index (BMI) of 45.0 to 49.9 in adult ([place]) 10/04/2017  . Class 3 severe obesity without serious comorbidity with  body mass index (BMI) of 45.0 to 49.9  in adult [facility]) 04/12/2017  . Vitamin D deficiency 04/12/2017  . Other fatigue 12/31/2016  . SOB (shortness of breath) on exertion 12/31/2016  . Type 2 diabetes mellitus without complication, without long-term current use of insulin ([place]) 12/31/2016  . Morbid obesity ([place]) 12/31/2016  . OA (osteoarthritis) of hip 12/02/2015    Willow Ora, PTA, [facility] [address] [place], [place] [address] [phone] 08/12/19, 8:24 PM   Name: Laura Blackwell MRN: [id number] Date of Birth: 30-Apr-1956

## 2019-08-14 ENCOUNTER — Ambulatory Visit: Payer: 59 | Admitting: Physical Therapy

## 2019-08-14 ENCOUNTER — Other Ambulatory Visit: Payer: Self-pay

## 2019-08-14 DIAGNOSIS — M6281 Muscle weakness (generalized): Secondary | ICD-10-CM

## 2019-08-14 DIAGNOSIS — R2681 Unsteadiness on feet: Secondary | ICD-10-CM

## 2019-08-14 DIAGNOSIS — R262 Difficulty in walking, not elsewhere classified: Secondary | ICD-10-CM

## 2019-08-14 NOTE — Therapy (Signed)
[facility] [address] [place], [place], [address] Phone: [phone]   Fax:  [phone]  Physical Therapy Treatment  Patient Details  Name: Laura Blackwell MRN: [id number] Date of Birth: [birth year] Referring Provider (PT): Carol Ada, MD   Encounter Date: 08/14/2019  PT End of Session - 08/14/19 1431    Visit Number  35    Number of Visits  40   per recert [id number] (number should have been 41 to capture 3x/wk for 8 weeks)   Date for PT Re-Evaluation  08/26/19    Authorization Type  UHC    Authorization - Visit Number  34    Authorization - Number of Visits  60    PT Start Time  0934    PT Stop Time  1014    PT Time Calculation (min)  40 min    Equipment Utilized During Treatment  --    Activity Tolerance  Patient tolerated treatment well    Behavior During Therapy  [facility] for tasks assessed/performed       Past Medical History:  Diagnosis Date  . Arthritis   . Diabetes mellitus without complication ([place])   . Edema    in left leg in 1980s on left ankle   . Hyperlipidemia   . Hypertension   . PCOS (polycystic ovarian syndrome)   . Superficial thrombophlebitis   . Swelling   . Venous insufficiency   . Vitamin D deficiency     Past Surgical History:  Procedure Laterality Date  . BREAST BIOPSY Right   . JOINT REPLACEMENT  2011    right hip   . left ankle surgery     . TOTAL HIP ARTHROPLASTY Left 12/02/2015   Procedure: LEFT TOTAL HIP ARTHROPLASTY ANTERIOR APPROACH;  Surgeon: [name], MD;  Location: [facility];  Service: Orthopedics;  Laterality: Left;    There were no vitals filed for this visit.  Subjective Assessment - 08/14/19 1425    Subjective  Really want to walk today in the gym-haven't had time to walk at home this weekend.    Pertinent History  spinal cord CVA 01/2019, diabetes mellitus, HTN, PCOS, vitamin D deficiency, sp right (2011) and left (2017) hip replacement, peripheral  edema, venous insufficiency.    How long can you walk comfortably?  only household distances, states could not walk around track in therapy gym with cane    Diagnostic tests  MRI showed an abnormal focus in the conus medullaris and lower spinal cord adjacent to T12-L1.    Patient Stated Goals  "wants to be 99.99% better, wants to be the best she can be    Currently in Pain?  No/denies                       [facility] Adult PT Treatment/Exercise - 08/14/19 0001      Ambulation/Gait   Ambulation/Gait  Yes    Ambulation/Gait Assistance  6: Modified independent (Device/Increase time);5: Supervision    Ambulation/Gait Assistance Details  Mod I with cane-does have increased R lateral trunk lean with faitgue with gait.    Ambulation Distance (Feet)  500 Feet   additional 115 ft x 2 reps; 80 ft, 40 ft x 2   Assistive device  None;Straight cane    Gait Pattern  Step-through pattern;Decreased stride length;Decreased stance time - left;Decreased step length - right;Decreased weight shift to left;Decreased trunk rotation;Narrow base of support;Lateral hip instability   Cues for increased R  step length   Ambulation Surface  Level;Indoor    Gait velocity  11.71 sec with cane = 2.81 ft/sec; 3 ft/sec    Gait Comments  3 [place] walk test:  431 ft with cane      Knee/Hip Exercises: Aerobic   Other Aerobic  Scifit UE/LE's on level 3.0 for 9 minutes with goal >/=60-70 rpm for strengthening of lower extremities.  HR  110 bpm following SciFit     Cues throughout gait for upright posture, abdominal activation, increased R step length.  Additional 40 ft x 2 no device to and from mat<>counter in gym area with supervision.     Balance Exercises - 08/14/19 1013      Balance Exercises: Standing   Gait with Head Turns  Forward;Intermittent upper extremity support;4 reps   Head nods, head turns along counter   Retro Gait  Upper extremity support;4 reps   Forward/back along counter-cues for  increased R step length         PT Short Term Goals - 07/24/19 1133      PT SHORT TERM GOAL #1   Title  Patient will be independent with progression ofHEP with focus on strength and balance in order to build upon functional gains in therapy. ALL STGs DUE 07/21/19.    Time  4    Period  Weeks    Status  Achieved    Target Date  07/21/19      PT SHORT TERM GOAL #2   Title  Patient will improve DGI to at least 16/24 for decreased fall risk.    Baseline  DGI 13/24 06/27/2019    Time  4    Period  Weeks    Status  Achieved    Target Date  07/21/19      PT SHORT TERM GOAL #3   Title  Pt will ambulate at least 450 ft in 3 minute walk test, with cane, for improved gait endurance and efficiency.    Baseline  396 ft with cane in 3 minute walk test; 490 ft in 3 minutes, 07/17/2019    Time  4    Period  Weeks    Status  Achieved    Target Date  07/21/19      PT SHORT TERM GOAL #4   Title  Pt will improve TUG score to less than or equal to 13.5 sec for decreased fall risk.    Baseline  15.31 sec 06/23/2019; 07/17/2019; 12.79 sec    Time  4    Period  Weeks    Status  Achieved    Target Date  07/21/19        PT Long Term Goals - 08/14/19 1006      PT LONG TERM GOAL #1   Title  Patient will be independent with final HEP with focus on strength and balance in order to build upon functional gains in therapy. ALL LTGs DUE 08/18/19.    Time  8    Period  Weeks    Status  On-going      PT LONG TERM GOAL #2   Title  Pt will improve DGI score to at least 19/24 for decreased fall risk.    Baseline  DGI 13/24 06/27/2019    Time  8    Period  Weeks    Status  New      PT LONG TERM GOAL #3   Title  Pt will improve gait velocity to at least 2.62 ft/sec for  improved gait effiency and safety in community.    Baseline  2.4 ft/sec 06/27/2019; 2.8 ft/sec with cane, 3 ft/sec no cane 08/14/2019    Time  8    Period  Weeks    Status  Achieved      PT LONG TERM GOAL #4   Title  Pt will  improve 3 MWT with cane to at least 550 ft, for improved gait endurance and efficiency.    Baseline  396 ft 06/27/2019    Time  8    Period  Weeks    Status  New      PT LONG TERM GOAL #5   Title  Patient will ambulate at least 800'modified independently, over indoor level/outdoor unlevel surfaces using cane with step through gait pattern in order to improve community mobility.    Time  8    Period  Weeks    Status  Revised            Plan - 08/14/19 1433    Clinical Impression Statement  Began assessing LTGs, with pt meeting LTG for improved gait velocity with and without cane.  Pt is ambulating at home short distances without cane, and gait velocity with no device with faster than with cane.  With 3MWT today, however, pt ambulates 431 ft compared to 490 ft at last check.  Will plan to assess remaining LTGs next visit in clinic and need to recert for further PT visits.    Personal Factors and Comorbidities  Past/Current Experience;Comorbidity 3+    Comorbidities  spinal cord CVA 01/2019, diabetes mellitus, HTN, PCOS, vitamin D deficiency, sp right (2011) and left (2017) hip replacement, peripheral edema, venous insufficiency.    Examination-Activity Limitations  Sit;Squat;Stairs;Stand;Transfers;Locomotion Level    Examination-Participation Restrictions  Meal Prep;Driving;Community Activity    Stability/Clinical Decision Making  Evolving/Moderate complexity    Rehab Potential  Good    PT Frequency  3x / week    PT Duration  8 weeks   per recert [id number], to include this week of therapy   PT Treatment/Interventions  ADLs/Self Care Home Management;DME Instruction;Gait training;Stair training;Functional mobility training;Therapeutic activities;Therapeutic exercise;Balance training;Patient/family education;Neuromuscular re-education;Energy conservation;Aquatic Therapy;Orthotic Fit/Training    PT Next Visit Plan  Check remaining LTGs by [id number] and plan to recert (pt may need to discuss  scheduling pool appts into January).  Continue ramp, compliant surface work-ankle plantar/dorsiflexion, balance strategies on compliant surfaces; gait training without device-check HR during activity    PT Home Exercise Plan  [id number] - plus seated hamstring stretch and seated forward flexion low back stretch, supine hip flexor stretch    Consulted and Agree with Plan of Care  Patient       Patient will benefit from skilled therapeutic intervention in order to improve the following deficits and impairments:  Abnormal gait, Decreased activity tolerance, Decreased balance, Decreased endurance, Decreased coordination, Decreased mobility, Decreased range of motion, Difficulty walking, Decreased strength, Impaired sensation  Visit Diagnosis: Unsteadiness on feet  Difficulty in walking, not elsewhere classified  Muscle weakness (generalized)     Problem List Patient Active Problem List   Diagnosis Date Noted  . Dysesthesia 03/29/2019  . Gait disturbance 03/29/2019  . Spinal cord infarction ([place]) 02/20/2019  . Cauda equina syndrome ([place]) 02/13/2019  . DM2 (diabetes mellitus, type 2) ([place]) 02/13/2019  . Syncope 02/13/2019  . Essential hypertension 02/13/2019  . Class 3 severe obesity with serious comorbidity and body mass index (BMI) of 45.0 to 49.9 in adult ([place]) 10/04/2017  .  Class 3 severe obesity without serious comorbidity with body mass index (BMI) of 45.0 to 49.9 in adult ([place]) 04/12/2017  . Vitamin D deficiency 04/12/2017  . Other fatigue 12/31/2016  . SOB (shortness of breath) on exertion 12/31/2016  . Type 2 diabetes mellitus without complication, without long-term current use of insulin ([place]) 12/31/2016  . Morbid obesity ([place]) 12/31/2016  . OA (osteoarthritis) of hip 12/02/2015    Catarino Vold W. 08/14/2019, 2:39 PM Frazier Butt., PT [facility] [address] [place], [place], [address] Phone: [phone]    Fax:  [phone]  Name: TIAIRA CROSTHWAITE MRN: [id number] Date of Birth: 1956/06/24

## 2019-08-16 ENCOUNTER — Ambulatory Visit: Payer: 59 | Admitting: Physical Therapy

## 2019-08-18 ENCOUNTER — Ambulatory Visit: Payer: 59 | Admitting: Physical Therapy

## 2019-08-18 ENCOUNTER — Encounter: Payer: Self-pay | Admitting: Physical Therapy

## 2019-08-18 ENCOUNTER — Other Ambulatory Visit: Payer: Self-pay

## 2019-08-18 DIAGNOSIS — M6281 Muscle weakness (generalized): Secondary | ICD-10-CM

## 2019-08-18 DIAGNOSIS — R262 Difficulty in walking, not elsewhere classified: Secondary | ICD-10-CM

## 2019-08-18 DIAGNOSIS — R2689 Other abnormalities of gait and mobility: Secondary | ICD-10-CM

## 2019-08-18 DIAGNOSIS — R2681 Unsteadiness on feet: Secondary | ICD-10-CM

## 2019-08-18 DIAGNOSIS — R29818 Other symptoms and signs involving the nervous system: Secondary | ICD-10-CM

## 2019-08-18 NOTE — Therapy (Signed)
[facility] [address] [place], [place], [address] Phone: [phone]   Fax:  [phone]  Physical Therapy Treatment  Patient Details  Name: Laura Blackwell MRN: [id number] Date of Birth: February 02, 1956 Referring Provider (PT): Carol Ada, MD   Encounter Date: 08/18/2019  PT End of Session - 08/18/19 1351    Visit Number  36    Number of Visits  56   per recert 17/49/4496   Date for PT Re-Evaluation  11/16/19    Authorization Type  [employer]    Authorization - Visit Number  60    Authorization - Number of Visits  60    PT Start Time  7591   Pt arrives late   PT Stop Time  1021    PT Time Calculation (min)  38 min    Activity Tolerance  Patient tolerated treatment well    Behavior During Therapy  [facility] for tasks assessed/performed       Past Medical History:  Diagnosis Date  . Arthritis   . Diabetes mellitus without complication ([place])   . Edema    in left leg in 1980s on left ankle   . Hyperlipidemia   . Hypertension   . PCOS (polycystic ovarian syndrome)   . Superficial thrombophlebitis   . Swelling   . Venous insufficiency   . Vitamin D deficiency     Past Surgical History:  Procedure Laterality Date  . BREAST BIOPSY Right   . JOINT REPLACEMENT  2011    right hip   . left ankle surgery     . TOTAL HIP ARTHROPLASTY Left 12/02/2015   Procedure: LEFT TOTAL HIP ARTHROPLASTY ANTERIOR APPROACH;  Surgeon: [name], MD;  Location: [facility];  Service: Orthopedics;  Laterality: Left;    There were no vitals filed for this visit.  Subjective Assessment - 08/18/19 0944    Subjective  Still having some numbness, LLE>RLE, but overall numbness is better.  Saw orthopedist and he said things look fine.  Orthopedist thought there might be some bursitis, so she gave me an injection into R hip.    Pertinent History  spinal cord CVA 01/2019, diabetes mellitus, HTN, PCOS, vitamin D deficiency, sp right (2011) and left  (2017) hip replacement, peripheral edema, venous insufficiency.    How long can you walk comfortably?  only household distances, states could not walk around track in therapy gym with cane    Diagnostic tests  MRI showed an abnormal focus in the conus medullaris and lower spinal cord adjacent to T12-L1.    Patient Stated Goals  "wants to be 99.99% better, wants to be the best she can be    Currently in Pain?  Yes    Pain Score  4     Pain Location  Knee   inner thigh to knee   Pain Orientation  Right    Pain Descriptors / Indicators  Aching    Pain Type  Acute pain    Pain Onset  Today    Pain Frequency  Constant    Aggravating Factors   not sure    Pain Relieving Factors  not sure         [facility] PT Assessment - 08/18/19 0001      Strength   Right Hip Flexion  4/5    Right Hip ABduction  3-/5    Left Hip Flexion  4/5    Right Ankle Dorsiflexion  3+/5    Left Ankle Dorsiflexion  5/5                   [facility] Adult PT Treatment/Exercise - 08/18/19 0001      Transfers   Transfers  Sit to Stand;Stand to Sit    Sit to Stand  6: Modified independent (Device/Increase time);With upper extremity assist;Without upper extremity assist;From chair/3-in-1;From bed    Stand to Sit  6: Modified independent (Device/Increase time);Without upper extremity assist;With upper extremity assist;To bed;To chair/3-in-1    Comments  At least 5 reps from chair, from mat throughout session.      Ambulation/Gait   Ambulation/Gait  Yes    Ambulation/Gait Assistance  6: Modified independent (Device/Increase time);5: Supervision    Ambulation Distance (Feet)  575 Feet   with cane   Assistive device  None;Straight cane    Gait Pattern  Step-through pattern;Decreased stride length;Decreased stance time - left;Decreased step length - right;Decreased weight shift to left;Decreased trunk rotation;Narrow base of support;Lateral hip instability;Trendelenburg    Ambulation Surface  Level;Indoor    Gait  Comments  3 minute walk test:  507 ft with cane      Standardized Balance Assessment   Standardized Balance Assessment  Dynamic Gait Index   Did not use cane today     Dynamic Gait Index   Level Surface  Mild Impairment    Change in Gait Speed  Mild Impairment    Gait with Horizontal Head Turns  Mild Impairment    Gait with Vertical Head Turns  Mild Impairment    Gait and Pivot Turn  Normal    Step Over Obstacle  Severe Impairment    Step Around Obstacles  Normal    Steps  Mild Impairment    Total Score  16      Self-Care   Self-Care  Other Self-Care Comments    Other Self-Care Comments   Explained progress towards goals, plans to continue with therapy; briefly discussed decreaseing frequency to 2/wk (1x/ in clinic, 1x/ in pool); however, pt frustrated that she is conitnueing to have such strong dip in R hip, so ultimately did not make changes to frequency in plan at this time.  Discussed mechanics of gait pattern, recent orthopedic visit with injection for R hip bursitis.  Palpated along R IT band, with tenderness, tightness noted.  Performed massage along IT band, and instructed pt in how to self-massage with pool noodle or rolling pin.  Educated in addition of IT band stretch and that she can use ice massage to R IT band as well.        Knee/Hip Exercises: Stretches   ITB Stretch  Right;3 reps;20 seconds    ITB Stretch Limitations  Performed at counter, cues for technique     Cues for stretch to avoid intense, burning of stretch; this should be a gentle, comfortable stretch        PT Education - 08/18/19 1351    Education Details  progress towards goals, POC, additions of IT band stretch and how she can use IT band stretch, massage, in addition to hip strengthening for further improvement in hip stability and mechanics of gait    Person(s) Educated  Patient    Methods  Explanation;Demonstration;Handout;Verbal cues    Comprehension  Verbalized understanding;Returned  demonstration;Verbal cues required       PT Short Term Goals - 07/24/19 1133      PT SHORT TERM GOAL #1   Title  Patient will be independent with progression ofHEP with focus on  strength and balance in order to build upon functional gains in therapy. ALL STGs DUE 07/21/19.    Time  4    Period  Weeks    Status  Achieved    Target Date  07/21/19      PT SHORT TERM GOAL #2   Title  Patient will improve DGI to at least 16/24 for decreased fall risk.    Baseline  DGI 13/24 06/27/2019    Time  4    Period  Weeks    Status  Achieved    Target Date  07/21/19      PT SHORT TERM GOAL #3   Title  Pt will ambulate at least 450 ft in 3 minute walk test, with cane, for improved gait endurance and efficiency.    Baseline  396 ft with cane in 3 minute walk test; 490 ft in 3 minutes, 07/17/2019    Time  4    Period  Weeks    Status  Achieved    Target Date  07/21/19      PT SHORT TERM GOAL #4   Title  Pt will improve TUG score to less than or equal to 13.5 sec for decreased fall risk.    Baseline  15.31 sec 06/23/2019; 07/17/2019; 12.79 sec    Time  4    Period  Weeks    Status  Achieved    Target Date  07/21/19        PT Long Term Goals - 08/18/19 1353      PT LONG TERM GOAL #1   Title  Patient will be independent with final HEP with focus on strength and balance in order to build upon functional gains in therapy. ALL LTGs DUE 08/18/19.    Baseline  IT band stretch added 08/18/2019    Time  8    Period  Weeks    Status  On-going      PT LONG TERM GOAL #2   Title  Pt will improve DGI score to at least 19/24 for decreased fall risk.    Baseline  DGI 13/24 06/27/2019; 16/24 (no cane) 08/18/2019    Time  8    Period  Weeks    Status  Not Met      PT LONG TERM GOAL #3   Title  Pt will improve gait velocity to at least 2.62 ft/sec for improved gait effiency and safety in community.    Baseline  2.4 ft/sec 06/27/2019; 2.8 ft/sec with cane, 3 ft/sec no cane 08/14/2019    Time  8     Period  Weeks    Status  Achieved      PT LONG TERM GOAL #4   Title  Pt will improve 3 MWT with cane to at least 550 ft, for improved gait endurance and efficiency.    Baseline  396 ft 06/27/2019; 507 ft 08/18/2019    Time  8    Period  Weeks    Status  Partially Met      PT LONG TERM GOAL #5   Title  Patient will ambulate at least 800'modified independently, over indoor level/outdoor unlevel surfaces using cane with step through gait pattern in order to improve community mobility.    Time  8    Period  Weeks    Status  On-going            Plan - 08/18/19 1356    Clinical Impression Statement  Continued checking LTGs this visit,  with LTG 1 ongoing, as PT continueing to make additions to HEP to address strength, flexibility, balance.  LTG 2 not met for DGI score, with DGI score improved to 16/24 (with no device).  LTG 3 met for gait velocity.  LTG 4 partially met, with improved distance in 3MWT from 396 ft to 507 ft with cane.  LTG 5 ogoing, as pt's max distance indoors is 575 ft with cane.  With fatigue, pt continues to note increased lateral R hip flexion, Trendelenburg-type pattern.  With MMT, R hip abduction is 3-/5; she has had recent injection for R hip bursitis and does appear to have tightness and tenderness along R IT band.  Pt will benefit from further skilled PT to further address hip, trunk, lower extremity strength, gait training, and balance towards improved functional mobility and decreased fall risk.  She remains motivated for therapy and continues to follow PT recommendations for exerciess at home.    Personal Factors and Comorbidities  Past/Current Experience;Comorbidity 3+    Comorbidities  spinal cord CVA 01/2019, diabetes mellitus, HTN, PCOS, vitamin D deficiency, sp right (2011) and left (2017) hip replacement, peripheral edema, venous insufficiency.    Examination-Activity Limitations  Sit;Squat;Stairs;Stand;Transfers;Locomotion Level    Examination-Participation  Restrictions  Meal Prep;Driving;Community Activity    Stability/Clinical Decision Making  Evolving/Moderate complexity    Rehab Potential  Good    PT Frequency  3x / week    PT Duration  8 weeks   per recert 75/88/3254, to include this week of therapy   PT Treatment/Interventions  ADLs/Self Care Home Management;DME Instruction;Gait training;Stair training;Functional mobility training;Therapeutic activities;Therapeutic exercise;Balance training;Patient/family education;Neuromuscular re-education;Energy conservation;Aquatic Therapy;Orthotic Fit/Training    PT Next Visit Plan  Trunk, R hip abduction strengthening (lateral trunk and trunk rotation strengthening), gait training for improved step length, improved posture, trunk control with gait    PT Home Exercise Plan  [email or web address] - plus seated hamstring stretch and seated forward flexion low back stretch, supine hip flexor stretch    Consulted and Agree with Plan of Care  Patient       Patient will benefit from skilled therapeutic intervention in order to improve the following deficits and impairments:  Abnormal gait, Decreased activity tolerance, Decreased balance, Decreased endurance, Decreased coordination, Decreased mobility, Decreased range of motion, Difficulty walking, Decreased strength, Impaired sensation  Visit Diagnosis: Unsteadiness on feet  Difficulty in walking, not elsewhere classified  Muscle weakness (generalized)  Other symptoms and signs involving the nervous system  Other abnormalities of gait and mobility     Problem List Patient Active Problem List   Diagnosis Date Noted  . Dysesthesia 03/29/2019  . Gait disturbance 03/29/2019  . Spinal cord infarction ([place]) 02/20/2019  . Cauda equina syndrome ([place]) 02/13/2019  . DM2 (diabetes mellitus, type 2) ([place]) 02/13/2019  . Syncope 02/13/2019  . Essential hypertension 02/13/2019  . Class 3 severe obesity with serious comorbidity and body mass index (BMI) of 45.0 to 49.9  in adult ([place]) 10/04/2017  . Class 3 severe obesity without serious comorbidity with body mass index (BMI) of 45.0 to 49.9 in adult ([place]) 04/12/2017  . Vitamin D deficiency 04/12/2017  . Other fatigue 12/31/2016  . SOB (shortness of breath) on exertion 12/31/2016  . Type 2 diabetes mellitus without complication, without long-term current use of insulin ([place]) 12/31/2016  . Morbid obesity ([place]) 12/31/2016  . OA (osteoarthritis) of hip 12/02/2015    Margrete Delude W. 08/18/2019, 2:15 PM  Frazier Butt., PT   [facility]  [address] [place], [place], [address] Phone: [phone]   Fax:  [phone]  Name: Laura Blackwell MRN: [id number] Date of Birth: [birth year]   For recert: PT Short Term Goals - 08/18/19 1416      PT SHORT TERM GOAL #1   Title  Patient will be independent with progression of HEP with focus on strength and balance in order to build upon functional gains in therapy. ALL STGs DUE 09/15/2019.    Time  4    Period  Weeks    Status  On-going    Target Date  09/15/19      PT SHORT TERM GOAL #2   Title  Patient will improve DGI to at least 19/24 for decreased fall risk.    Baseline  DGI 16/24 08/18/2019    Time  4    Period  Weeks    Status  Revised    Target Date  09/15/19      PT SHORT TERM GOAL #3   Title  Pt will ambulate at least 550 ft in 3 minute walk test, with cane, for improved gait endurance and efficiency.    Baseline  507 ft in 3 minute walk 08/18/2019    Time  4    Period  Weeks    Status  Achieved    Target Date  09/15/19      PT SHORT TERM GOAL #4   Title  Pt will ambulate household distances, 50-100 ft, no device with minimal to no R lateral hip sway, for improved return to independent mobility.    Time  4    Period  Weeks    Status  New    Target Date  09/15/19      PT Long Term Goals - 08/18/19 1419      PT LONG TERM GOAL #1   Title  Patient will be independent with  final HEP with focus on strength and balance in order to build upon functional gains in therapy. ALL LTGs DUE 10/13/2019.    Time  8    Period  Weeks    Status  On-going    Target Date  10/13/19      PT LONG TERM GOAL #2   Title  Pt will improve DGI score to at least 21/24 for decreased fall risk.    Baseline  DGI 13/24 06/27/2019; 16/24 (no cane) 08/18/2019    Time  8    Period  Weeks    Status  Revised    Target Date  10/13/19      PT LONG TERM GOAL #3   Title  Pt will negotiate curb step and obstacle, no cane, independently, to demonstrate improved strength and balance for improved mobility.    Baseline  uses HHA for obstacle negoitation, curb with cane    Time  8    Period  Weeks    Status  New    Target Date  10/13/19      PT LONG TERM GOAL #4   Title  Pt will improve 3 MWT with cane to at least 600 ft, for improved gait endurance and efficiency.    Baseline  396 ft 06/27/2019; 507 ft 08/18/2019    Time  8    Period  Weeks    Status  Revised    Target Date  10/13/19      PT LONG TERM GOAL #5   Title  Patient will ambulate at least 800'modified independently, over indoor level/outdoor  unlevel surfaces using cane with step through gait pattern in order to improve community mobility.    Time  8    Period  Weeks    Status  On-going    Target Date  10/13/19      Plan - 08/18/19 1356    Clinical Impression Statement  Continued checking LTGs this visit, with LTG 1 ongoing, as PT continueing to make additions to HEP to address strength, flexibility, balance.  LTG 2 not met for DGI score, with DGI score improved to 16/24 (with no device).  LTG 3 met for gait velocity.  LTG 4 partially met, with improved distance in 3MWT from 396 ft to 507 ft with cane.  LTG 5 ogoing, as pt's max distance indoors is 575 ft with cane.  With fatigue, pt continues to note increased lateral R hip flexion, Trendelenburg-type pattern.  With MMT, R hip abduction is 3-/5; she has had recent injection for R  hip bursitis and does appear to have tightness and tenderness along R IT band.  Pt will benefit from further skilled PT to further address hip, trunk, lower extremity strength, gait training, and balance towards improved functional mobility and decreased fall risk.  She remains motivated for therapy and continues to follow PT recommendations for exerciess at home.    Personal Factors and Comorbidities  Past/Current Experience;Comorbidity 3+    Comorbidities  spinal cord CVA 01/2019, diabetes mellitus, HTN, PCOS, vitamin D deficiency, sp right (2011) and left (2017) hip replacement, peripheral edema, venous insufficiency.    Examination-Activity Limitations  Sit;Squat;Stairs;Stand;Transfers;Locomotion Level    Examination-Participation Restrictions  Meal Prep;Driving;Community Activity    Stability/Clinical Decision Making  Evolving/Moderate complexity    Rehab Potential  Good    PT Frequency  Other (comment)   3x/wk for 4 weeks, then 2x/wk for 4 weeks (per recert 76/19/5093   PT Duration  8 weeks    PT Treatment/Interventions  ADLs/Self Care Home Management;DME Instruction;Gait training;Stair training;Functional mobility training;Therapeutic activities;Therapeutic exercise;Balance training;Patient/family education;Neuromuscular re-education;Energy conservation;Aquatic Therapy;Orthotic Fit/Training    PT Next Visit Plan  Trunk, R hip abduction strengthening (lateral trunk and trunk rotation strengthening), gait training for improved step length, improved posture, trunk control with gait    PT Home Exercise Plan  [email or web address] - plus seated hamstring stretch and seated forward flexion low back stretch, supine hip flexor stretch    Consulted and Agree with Plan of Care  Patient      Mady Haagensen, PT 08/18/19 2:29 PM Phone: [phone] Fax: [phone]

## 2019-08-18 NOTE — Patient Instructions (Addendum)
Access Code: [email or web address]  URL: [email or web address]  Date: 08/18/2019  Prepared by: Mady Haagensen   Exercises Standing Hip Abduction with Counter Support - 5 reps - 3 sets - 2x daily - 7x weekly Standing Marching - 10 reps - 2 sets - 2x daily - 7x weekly Seated Ankle Plantar Flexion with Resistance Loop - 10 reps - 2-3 sets - 3 sec hold - 1x daily - 7x weekly Mini Squat - 10 reps - 2 sets - 1x daily - 7x weekly Clamshell - 10 reps - 2 sets - 2x daily - 7x weekly Supine Diaphragmatic Breathing - 10 reps - 3 sets - 1x daily - 7x weekly Backward Walking with Counter Support - 2 reps - 2x daily - 7x weekly Tandem Walking with Counter Support - 2 sets - 2x daily - 7x weekly Side Stepping with Resistance at Thighs - 2 sets - 2x daily - 7x weekly  Added 08/18/2019: ITB Stretch at Wall - 3 reps - 1 sets - 30 sec hold - 1x daily - 5x weekly

## 2019-08-21 ENCOUNTER — Ambulatory Visit: Payer: 59 | Admitting: Physical Therapy

## 2019-08-21 ENCOUNTER — Other Ambulatory Visit: Payer: Self-pay

## 2019-08-21 DIAGNOSIS — M6281 Muscle weakness (generalized): Secondary | ICD-10-CM

## 2019-08-21 DIAGNOSIS — R262 Difficulty in walking, not elsewhere classified: Secondary | ICD-10-CM

## 2019-08-21 DIAGNOSIS — R2681 Unsteadiness on feet: Secondary | ICD-10-CM

## 2019-08-22 ENCOUNTER — Ambulatory Visit: Payer: 59 | Admitting: Physical Therapy

## 2019-08-22 ENCOUNTER — Other Ambulatory Visit: Payer: Self-pay

## 2019-08-22 DIAGNOSIS — R262 Difficulty in walking, not elsewhere classified: Secondary | ICD-10-CM

## 2019-08-22 DIAGNOSIS — R2681 Unsteadiness on feet: Secondary | ICD-10-CM

## 2019-08-22 DIAGNOSIS — M6281 Muscle weakness (generalized): Secondary | ICD-10-CM

## 2019-08-22 DIAGNOSIS — R29818 Other symptoms and signs involving the nervous system: Secondary | ICD-10-CM

## 2019-08-22 NOTE — Therapy (Signed)
[facility] [address] [place], [place], [address] Phone: [phone]   Fax:  [phone]  Physical Therapy Treatment  Patient Details  Name: Laura Blackwell MRN: [id number] Date of Birth: 1956/01/09 Referring Provider (PT): Carol Ada, MD   Encounter Date: 08/22/2019  PT End of Session - 08/22/19 1018    Visit Number  38    Number of Visits  56   per recert [id number]   Date for PT Re-Evaluation  11/16/19    Authorization Type  U[employer]    Authorization - Visit Number  78    Authorization - Number of Visits  60    PT Start Time  0929    PT Stop Time  1013    PT Time Calculation (min)  44 min    Equipment Utilized During Treatment  Gait belt    Activity Tolerance  Patient tolerated treatment well    Behavior During Therapy  WFL for tasks assessed/performed       Past Medical History:  Diagnosis Date  . Arthritis   . Diabetes mellitus without complication ([place])   . Edema    in left leg in 1980s on left ankle   . Hyperlipidemia   . Hypertension   . PCOS (polycystic ovarian syndrome)   . Superficial thrombophlebitis   . Swelling   . Venous insufficiency   . Vitamin D deficiency     Past Surgical History:  Procedure Laterality Date  . BREAST BIOPSY Right   . JOINT REPLACEMENT  2011    right hip   . left ankle surgery     . TOTAL HIP ARTHROPLASTY Left 12/02/2015   Procedure: LEFT TOTAL HIP ARTHROPLASTY ANTERIOR APPROACH;  Surgeon: [name], MD;  Location: [facility];  Service: Orthopedics;  Laterality: Left;    There were no vitals filed for this visit.  Subjective Assessment - 08/22/19 0931    Subjective  Has been doing the IT band stretch at home - still needs to buy a rolling pin. Wants to work on her R hip dip.    Pertinent History  spinal cord CVA 01/2019, diabetes mellitus, HTN, PCOS, vitamin D deficiency, sp right (2011) and left (2017) hip replacement, peripheral edema, venous insufficiency.     How long can you walk comfortably?  only household distances, states could not walk around track in therapy gym with cane    Diagnostic tests  MRI showed an abnormal focus in the conus medullaris and lower spinal cord adjacent to T12-L1.    Patient Stated Goals  "wants to be 99.99% better, wants to be the best she can be    Currently in Pain?  Yes    Pain Score  4     Pain Location  Hip    Pain Orientation  Right    Pain Descriptors / Indicators  Aching    Pain Onset  Today                       [facility] Adult PT Treatment/Exercise - 08/22/19 0956      Ambulation/Gait   Ambulation/Gait  Yes    Ambulation/Gait Assistance  5: Supervision    Ambulation/Gait Assistance Details  without AD - continues to demo increased R lateral trunk lean    Ambulation Distance (Feet)  230 Feet    Assistive device  None    Gait Pattern  Step-through pattern;Decreased stride length;Decreased stance time - left;Decreased step length - right;Decreased weight  shift to left;Decreased trunk rotation;Narrow base of support;Lateral hip instability;Trendelenburg    Ambulation Surface  Indoor;Level      Self-Care   Other Self-Care Comments   Discussed POC going forward - 3x week for an additional month and dropping down to 2x week for following month, pt verbalizing understanding and is in agreement with current plan.       Neuro Re-ed    Neuro Re-ed Details   On large rockerboard on mat with feet on 4" step: medial/lateral weight shifting with lateral trunk activation, progressed to adding BUE in shoulder ABD and reaching laterally towards cones,  progressing to adding diagonal reaching towards cones (superiorly and inferiorly), min guard for balance. Standing at edge of mat: Lift and chops in a diagonal/rotational pattern with 2 lb medicine ball with addition of mini squat 2 x 7 reps - focus on trunk rotation.       Exercises   Other Exercises   Supine hooklying position, reaching RUE towards R heel  and alternating reaching LUE towards L heel, 2 x 10 reps. Supine hookyling oblique crunch, 2 x 5 reps B, reaching arm to contralateral knee.                PT Short Term Goals - 08/18/19 1416      PT SHORT TERM GOAL #1   Title  Patient will be independent with progression of HEP with focus on strength and balance in order to build upon functional gains in therapy. ALL STGs DUE 09/15/2019.    Time  4    Period  Weeks    Status  On-going    Target Date  09/15/19      PT SHORT TERM GOAL #2   Title  Patient will improve DGI to at least 19/24 for decreased fall risk.    Baseline  DGI 16/24 08/18/2019    Time  4    Period  Weeks    Status  Revised    Target Date  09/15/19      PT SHORT TERM GOAL #3   Title  Pt will ambulate at least 550 ft in 3 minute walk test, with cane, for improved gait endurance and efficiency.    Baseline  507 ft in 3 minute walk 08/18/2019    Time  4    Period  Weeks    Status  Achieved    Target Date  09/15/19      PT SHORT TERM GOAL #4   Title  Pt will ambulate household distances, 50-100 ft, no device with minimal to no R lateral hip sway, for improved return to independent mobility.    Time  4    Period  Weeks    Status  New    Target Date  09/15/19        PT Long Term Goals - 08/18/19 1419      PT LONG TERM GOAL #1   Title  Patient will be independent with final HEP with focus on strength and balance in order to build upon functional gains in therapy. ALL LTGs DUE 10/13/2019.    Time  8    Period  Weeks    Status  On-going    Target Date  10/13/19      PT LONG TERM GOAL #2   Title  Pt will improve DGI score to at least 21/24 for decreased fall risk.    Baseline  DGI 13/24 06/27/2019; 16/24 (no cane) 08/18/2019    Time  8    Period  Weeks    Status  Revised    Target Date  10/13/19      PT LONG TERM GOAL #3   Title  Pt will negotiate curb step and obstacle, no cane, independently, to demonstrate improved strength and balance for  improved mobility.    Baseline  uses HHA for obstacle negoitation, curb with cane    Time  8    Period  Weeks    Status  New    Target Date  10/13/19      PT LONG TERM GOAL #4   Title  Pt will improve 3 MWT with cane to at least 600 ft, for improved gait endurance and efficiency.    Baseline  396 ft 06/27/2019; 507 ft 08/18/2019    Time  8    Period  Weeks    Status  Revised    Target Date  10/13/19      PT LONG TERM GOAL #5   Title  Patient will ambulate at least 800'modified independently, over indoor level/outdoor unlevel surfaces using cane with step through gait pattern in order to improve community mobility.    Time  8    Period  Weeks    Status  On-going    Target Date  10/13/19            Plan - 08/22/19 1021    Clinical Impression Statement  Focus of today's skilled session was trunk, LE strengthening, and core strengthening to improve functional mobility and gait. Pt tolerated session well today, with intermittent seated rest breaks throughout. Pt continues to demonstrate increased lateral R trunk/hip flexion during gait, with and without an AD. Will continue to benefit from skilled PT in order to meet LTGs.    Personal Factors and Comorbidities  Past/Current Experience;Comorbidity 3+    Comorbidities  spinal cord CVA 01/2019, diabetes mellitus, HTN, PCOS, vitamin D deficiency, sp right (2011) and left (2017) hip replacement, peripheral edema, venous insufficiency.    Examination-Activity Limitations  Sit;Squat;Stairs;Stand;Transfers;Locomotion Level    Examination-Participation Restrictions  Meal Prep;Driving;Community Activity    Stability/Clinical Decision Making  Evolving/Moderate complexity    Rehab Potential  Good    PT Frequency  Other (comment)   3x/wk for 4 weeks, then 2x/wk for 4 weeks (per recert [id number]   PT Duration  8 weeks    PT Treatment/Interventions  ADLs/Self Care Home Management;DME Instruction;Gait training;Stair training;Functional mobility  training;Therapeutic activities;Therapeutic exercise;Balance training;Patient/family education;Neuromuscular re-education;Energy conservation;Aquatic Therapy;Orthotic Fit/Training    PT Next Visit Plan  Trunk, R hip abduction strengthening (lateral trunk and trunk rotation strengthening), gait training for improved step length, improved posture, trunk control with gait    PT Home Exercise Plan  [id number] - plus seated hamstring stretch and seated forward flexion low back stretch, supine hip flexor stretch    Consulted and Agree with Plan of Care  Patient       Patient will benefit from skilled therapeutic intervention in order to improve the following deficits and impairments:  Abnormal gait, Decreased activity tolerance, Decreased balance, Decreased endurance, Decreased coordination, Decreased mobility, Decreased range of motion, Difficulty walking, Decreased strength, Impaired sensation  Visit Diagnosis: Difficulty in walking, not elsewhere classified  Unsteadiness on feet  Muscle weakness (generalized)  Other symptoms and signs involving the nervous system     Problem List Patient Active Problem List   Diagnosis Date Noted  . Dysesthesia 03/29/2019  . Gait disturbance 03/29/2019  . Spinal cord infarction ([place]) 02/20/2019  .  Cauda equina syndrome ([place]) 02/13/2019  . DM2 (diabetes mellitus, type 2) ([place]) 02/13/2019  . Syncope 02/13/2019  . Essential hypertension 02/13/2019  . Class 3 severe obesity with serious comorbidity and body mass index (BMI) of 45.0 to 49.9 in adult ([place]) 10/04/2017  . Class 3 severe obesity without serious comorbidity with body mass index (BMI) of 45.0 to 49.9 in adult ([place]) 04/12/2017  . Vitamin D deficiency 04/12/2017  . Other fatigue 12/31/2016  . SOB (shortness of breath) on exertion 12/31/2016  . Type 2 diabetes mellitus without complication, without long-term current use of insulin ([place]) 12/31/2016  . Morbid obesity ([place]) 12/31/2016  . OA  (osteoarthritis) of hip 12/02/2015    Arliss Journey, PT, DPT 08/22/2019, 11:56 AM  [facility] [address] [place], [place], [address] Phone: [phone]   Fax:  [phone]  Name: Laura Blackwell MRN: [id number] Date of Birth: 1956-05-12

## 2019-08-23 ENCOUNTER — Encounter: Payer: Self-pay | Admitting: Physical Therapy

## 2019-08-23 NOTE — Therapy (Signed)
[facility] [address] [place], [place], [address] Phone: [phone]   Fax:  [phone]  Physical Therapy Treatment  Patient Details  Name: Laura Blackwell MRN: [id number] Date of Birth: [birth year] Referring Provider (PT): Carol Ada, MD   Encounter Date: 08/21/2019  PT End of Session - 08/22/19 0848    Visit Number  37    Number of Visits  56   per recert [id number]   Date for PT Re-Evaluation  11/16/19    Authorization Type  UHC    Authorization - Visit Number  31    Authorization - Number of Visits  60    PT Start Time  [id number]    PT Stop Time  1500    PT Time Calculation (min)  45 min    Equipment Utilized During Treatment  Other (comment)   pool noodle, buoyancy cuffs   Activity Tolerance  Patient tolerated treatment well    Behavior During Therapy  [facility] for tasks assessed/performed       Past Medical History:  Diagnosis Date  . Arthritis   . Diabetes mellitus without complication ([place])   . Edema    in left leg in 1980s on left ankle   . Hyperlipidemia   . Hypertension   . PCOS (polycystic ovarian syndrome)   . Superficial thrombophlebitis   . Swelling   . Venous insufficiency   . Vitamin D deficiency     Past Surgical History:  Procedure Laterality Date  . BREAST BIOPSY Right   . JOINT REPLACEMENT  2011    right hip   . left ankle surgery     . TOTAL HIP ARTHROPLASTY Left 12/02/2015   Procedure: LEFT TOTAL HIP ARTHROPLASTY ANTERIOR APPROACH;  Surgeon: [name], MD;  Location: [facility];  Service: Orthopedics;  Laterality: Left;    There were no vitals filed for this visit.    Aquatic therapy at [facility] - pool temp 87.4 degrees   Patient seen for aquatic therapy today. Treatment took place in water 3.5-4 feet deep depending upon activity. Pt entered and exited the pool via step negotiation with use of bilateral handrails with close supervision for safety. Pt using cane to ambulate  on pool deck to steps. Pt able to go up steps with alternating step today.  Pt performed runner's stretch on each leg and heel cord stretch bil. LE's with feet on pool wall - 30 sec hold each stretch x 1 rep. Repeated again at end of session. Pt performed amb. 28m across pool 2 reps forward, Side stepping x 2 reps of 61m. Side step squats with bil UE arm abduction 25 m x 2 reps Backward walking 25 m x2reps. Worked on increased speed of turns while maintaining balance.  Pt performed standing hip flexion, abduction, hamstring curl and hip extension with knee extended with blue buoyancy cuffs x 20reps each on bil LE's with 1 UE-intermittent support; Performed 20reps going from standing to hip flexion back to hip extension with knee flexed then back to standing. Performed squats x 15at edge of pool for UE support then x 15single leg squats with UE support.  Marching in place 10 reps each leg withoutUE support. Standing at pool edge for heel raises x 20 x 2 setsand toe raises x 20 x 1 set. Half jumping jacks x 15 reps with 1 UE support  Pt performed Ai Chi Postures of Enclosing and Soothing withoutUE support then Rounding with 1 UEsupport. Performed >15 reps each.  Stepping forward<>backward with single leg over colored tile in pool without UE assist x 15 reps each side.   Pt supine with pool noodle behind her back for bicycling x 61m, hip abd/add x 25 m. Then in same position with bil feet on pool edge, bending bil knees then pushing away from wall with PTA support behind her back and neck. Performed x 10 reps. Pt requires aquatic therapy for the unweighting provided by the buoyancy of the water and needs the viscosity for strengthening LE's and trunk ; buoyancy of water needed for support with walking for balance and for safety as pt is at fall risk on land; Viscosity of water needed for resistance for strengthening bil LE     PT Short Term Goals - 08/18/19 1416      PT  SHORT TERM GOAL #1   Title  Patient will be independent with progression of HEP with focus on strength and balance in order to build upon functional gains in therapy. ALL STGs DUE 09/15/2019.    Time  4    Period  Weeks    Status  On-going    Target Date  09/15/19      PT SHORT TERM GOAL #2   Title  Patient will improve DGI to at least 19/24 for decreased fall risk.    Baseline  DGI 16/24 08/18/2019    Time  4    Period  Weeks    Status  Revised    Target Date  09/15/19      PT SHORT TERM GOAL #3   Title  Pt will ambulate at least 550 ft in 3 minute walk test, with cane, for improved gait endurance and efficiency.    Baseline  507 ft in 3 minute walk 08/18/2019    Time  4    Period  Weeks    Status  Achieved    Target Date  09/15/19      PT SHORT TERM GOAL #4   Title  Pt will ambulate household distances, 50-100 ft, no device with minimal to no R lateral hip sway, for improved return to independent mobility.    Time  4    Period  Weeks    Status  New    Target Date  09/15/19        PT Long Term Goals - 08/18/19 1419      PT LONG TERM GOAL #1   Title  Patient will be independent with final HEP with focus on strength and balance in order to build upon functional gains in therapy. ALL LTGs DUE 10/13/2019.    Time  8    Period  Weeks    Status  On-going    Target Date  10/13/19      PT LONG TERM GOAL #2   Title  Pt will improve DGI score to at least 21/24 for decreased fall risk.    Baseline  DGI 13/24 06/27/2019; 16/24 (no cane) 08/18/2019    Time  8    Period  Weeks    Status  Revised    Target Date  10/13/19      PT LONG TERM GOAL #3   Title  Pt will negotiate curb step and obstacle, no cane, independently, to demonstrate improved strength and balance for improved mobility.    Baseline  uses HHA for obstacle negoitation, curb with cane    Time  8    Period  Weeks    Status  New  Target Date  10/13/19      PT LONG TERM GOAL #4   Title  Pt will improve 3 MWT  with cane to at least 600 ft, for improved gait endurance and efficiency.    Baseline  396 ft 06/27/2019; 507 ft 08/18/2019    Time  8    Period  Weeks    Status  Revised    Target Date  10/13/19      PT LONG TERM GOAL #5   Title  Patient will ambulate at least 800'modified independently, over indoor level/outdoor unlevel surfaces using cane with step through gait pattern in order to improve community mobility.    Time  8    Period  Weeks    Status  On-going    Target Date  10/13/19            Plan - 08/23/19 1246    Clinical Impression Statement  Pt's balance and strength continue to gradually improve with aquatic sessions.  Pt continues to have difficulty with bil plantar flexion.  Continue PT per POC.    Personal Factors and Comorbidities  Past/Current Experience;Comorbidity 3+    Comorbidities  spinal cord CVA 01/2019, diabetes mellitus, HTN, PCOS, vitamin D deficiency, sp right (2011) and left (2017) hip replacement, peripheral edema, venous insufficiency.    Examination-Activity Limitations  Sit;Squat;Stairs;Stand;Transfers;Locomotion Level    Examination-Participation Restrictions  Meal Prep;Driving;Community Activity    Stability/Clinical Decision Making  Evolving/Moderate complexity    Rehab Potential  Good    PT Frequency  Other (comment)   3x/wk for 4 weeks, then 2x/wk for 4 weeks (per recert [id number]   PT Duration  8 weeks    PT Treatment/Interventions  ADLs/Self Care Home Management;DME Instruction;Gait training;Stair training;Functional mobility training;Therapeutic activities;Therapeutic exercise;Balance training;Patient/family education;Neuromuscular re-education;Energy conservation;Aquatic Therapy;Orthotic Fit/Training    PT Next Visit Plan  Trunk, R hip abduction strengthening (lateral trunk and trunk rotation strengthening), gait training for improved step length, improved posture, trunk control with gait    PT Home Exercise Plan  [id number] - plus seated hamstring  stretch and seated forward flexion low back stretch, supine hip flexor stretch    Consulted and Agree with Plan of Care  Patient       Patient will benefit from skilled therapeutic intervention in order to improve the following deficits and impairments:  Abnormal gait, Decreased activity tolerance, Decreased balance, Decreased endurance, Decreased coordination, Decreased mobility, Decreased range of motion, Difficulty walking, Decreased strength, Impaired sensation  Visit Diagnosis: Unsteadiness on feet  Muscle weakness (generalized)  Difficulty in walking, not elsewhere classified     Problem List Patient Active Problem List   Diagnosis Date Noted  . Dysesthesia 03/29/2019  . Gait disturbance 03/29/2019  . Spinal cord infarction ([place]) 02/20/2019  . Cauda equina syndrome ([place]) 02/13/2019  . DM2 (diabetes mellitus, type 2) ([place]) 02/13/2019  . Syncope 02/13/2019  . Essential hypertension 02/13/2019  . Class 3 severe obesity with serious comorbidity and body mass index (BMI) of 45.0 to 49.9 in adult ([place]) 10/04/2017  . Class 3 severe obesity without serious comorbidity with body mass index (BMI) of 45.0 to 49.9 in adult ([place]) 04/12/2017  . Vitamin D deficiency 04/12/2017  . Other fatigue 12/31/2016  . SOB (shortness of breath) on exertion 12/31/2016  . Type 2 diabetes mellitus without complication, without long-term current use of insulin ([place]) 12/31/2016  . Morbid obesity ([place]) 12/31/2016  . OA (osteoarthritis) of hip 12/02/2015    Narda Bonds, PTA [facility] 08/23/19 12:49 PM Phone: [phone] Fax: [facility] [address] [place], [place], [address] Phone: [phone]   Fax:  [phone]  Name: Laura Blackwell MRN: [id number] Date of Birth: [birth year]

## 2019-08-29 ENCOUNTER — Other Ambulatory Visit: Payer: Self-pay

## 2019-08-29 ENCOUNTER — Ambulatory Visit: Payer: 59 | Admitting: Physical Therapy

## 2019-08-29 ENCOUNTER — Encounter: Payer: Self-pay | Admitting: Physical Therapy

## 2019-08-29 DIAGNOSIS — R29818 Other symptoms and signs involving the nervous system: Secondary | ICD-10-CM

## 2019-08-29 DIAGNOSIS — R262 Difficulty in walking, not elsewhere classified: Secondary | ICD-10-CM

## 2019-08-29 DIAGNOSIS — R2681 Unsteadiness on feet: Secondary | ICD-10-CM

## 2019-08-29 DIAGNOSIS — M6281 Muscle weakness (generalized): Secondary | ICD-10-CM | POA: Diagnosis not present

## 2019-08-30 NOTE — Therapy (Signed)
[facility] [address] [place], [place], [address] Phone: [phone]   Fax:  [phone]  Physical Therapy Treatment  Patient Details  Name: Laura Blackwell MRN: [id number] Date of Birth: 1956/08/11 Referring Provider (PT): Carol Ada, MD   Encounter Date: 08/29/2019  PT End of Session - 08/29/19 1022    Visit Number  39    Number of Visits  56   per recert [id number]   Date for PT Re-Evaluation  11/16/19    Authorization Type  U[employer]    Authorization - Visit Number  23    Authorization - Number of Visits  60    PT Start Time  1018    PT Stop Time  1100    PT Time Calculation (min)  42 min    Equipment Utilized During Treatment  Gait belt    Activity Tolerance  Patient tolerated treatment well;No increased pain    Behavior During Therapy  WFL for tasks assessed/performed       Past Medical History:  Diagnosis Date  . Arthritis   . Diabetes mellitus without complication ([facility])   . Edema    in left leg in 1980s on left ankle   . Hyperlipidemia   . Hypertension   . PCOS (polycystic ovarian syndrome)   . Superficial thrombophlebitis   . Swelling   . Venous insufficiency   . Vitamin D deficiency     Past Surgical History:  Procedure Laterality Date  . BREAST BIOPSY Right   . JOINT REPLACEMENT  2011    right hip   . left ankle surgery     . TOTAL HIP ARTHROPLASTY Left 12/02/2015   Procedure: LEFT TOTAL HIP ARTHROPLASTY ANTERIOR APPROACH;  Surgeon: [name], MD;  Location: [facility];  Service: Orthopedics;  Laterality: Left;    There were no vitals filed for this visit.  Subjective Assessment - 08/29/19 1020    Subjective  Reports the HEP is going well. Feels her walking is getting better with less "hip dip".    Pertinent History  spinal cord CVA 01/2019, diabetes mellitus, HTN, PCOS, vitamin D deficiency, sp right (2011) and left (2017) hip replacement, peripheral edema, venous insufficiency.    How long can you walk comfortably?  only household distances, states could not walk around track in therapy gym with cane    Diagnostic tests  MRI showed an abnormal focus in the conus medullaris and lower spinal cord adjacent to T12-L1.    Patient Stated Goals  "wants to be 99.99% better, wants to be the best she can be    Currently in Pain?  Yes    Pain Score  4     Pain Location  Hip    Pain Orientation  Right    Pain Descriptors / Indicators  Aching    Pain Type  Acute pain;Chronic pain    Pain Onset  1 to 4 weeks ago    Pain Frequency  Constant    Aggravating Factors   unsure    Pain Relieving Factors  exercises            [facility] Adult PT Treatment/Exercise - 08/29/19 1029      Neuro Re-ed    Neuro Re-ed Details   for strengthening/muscle re-ed: seated on large rocker board with feet on 4 inch box for stability- rocking the board laterally with emphasis on tall posture, progressing to lateral rocking with contralateral UE raise for 10 reps each side, then rocking  the board with same side UE raise with hip depression for 10 reps each side; holding board steady with 2# weighted ball- UE raises for 10 reps, diagonal ball movements with emphasis on trunk rotation for 10 reps each side, holding ball straight out at shoulder level then moving ball in horizontal abduction plane for 10 reps each way.        Exercises   Exercises  Other Exercises    Other Exercises   prone on mat table with bil LE's: glut kick backs, then hip extension with knee extension for 2 sets of 10 reps. assist needed for form and technique.       Knee/Hip Exercises: Standing   Other Standing Knee Exercises  seated at edge of mat with green band around knees: sit<>stands with emphasis on keeping band pulled tight for 10 reps, 2 sets done;      Other Standing Knee Exercises  with greenband around legs just above knees:  resisted side stepping along 10 foot path for 4 laps each way with min HHA for balance; with band  around ankles- alternating hip side kicks for 10 reps each side with UE support for balance, cues on form/technique, then alternating backward hip kicks for 10 reps each side with cues to keep knees straight and on form/posture.          PT Short Term Goals - 08/18/19 1416      PT SHORT TERM GOAL #1   Title  Patient will be independent with progression of HEP with focus on strength and balance in order to build upon functional gains in therapy. ALL STGs DUE 09/15/2019.    Time  4    Period  Weeks    Status  On-going    Target Date  09/15/19      PT SHORT TERM GOAL #2   Title  Patient will improve DGI to at least 19/24 for decreased fall risk.    Baseline  DGI 16/24 08/18/2019    Time  4    Period  Weeks    Status  Revised    Target Date  09/15/19      PT SHORT TERM GOAL #3   Title  Pt will ambulate at least 550 ft in 3 minute walk test, with cane, for improved gait endurance and efficiency.    Baseline  507 ft in 3 minute walk 08/18/2019    Time  4    Period  Weeks    Status  Achieved    Target Date  09/15/19      PT SHORT TERM GOAL #4   Title  Pt will ambulate household distances, 50-100 ft, no device with minimal to no R lateral hip sway, for improved return to independent mobility.    Time  4    Period  Weeks    Status  New    Target Date  09/15/19        PT Long Term Goals - 08/18/19 1419      PT LONG TERM GOAL #1   Title  Patient will be independent with final HEP with focus on strength and balance in order to build upon functional gains in therapy. ALL LTGs DUE 10/13/2019.    Time  8    Period  Weeks    Status  On-going    Target Date  10/13/19      PT LONG TERM GOAL #2   Title  Pt will improve DGI score to at least 21/24  for decreased fall risk.    Baseline  DGI 13/24 06/27/2019; 16/24 (no cane) 08/18/2019    Time  8    Period  Weeks    Status  Revised    Target Date  10/13/19      PT LONG TERM GOAL #3   Title  Pt will negotiate curb step and obstacle,  no cane, independently, to demonstrate improved strength and balance for improved mobility.    Baseline  uses HHA for obstacle negoitation, curb with cane    Time  8    Period  Weeks    Status  New    Target Date  10/13/19      PT LONG TERM GOAL #4   Title  Pt will improve 3 MWT with cane to at least 600 ft, for improved gait endurance and efficiency.    Baseline  396 ft 06/27/2019; 507 ft 08/18/2019    Time  8    Period  Weeks    Status  Revised    Target Date  10/13/19      PT LONG TERM GOAL #5   Title  Patient will ambulate at least 800'modified independently, over indoor level/outdoor unlevel surfaces using cane with step through gait pattern in order to improve community mobility.    Time  8    Period  Weeks    Status  On-going    Target Date  10/13/19            Plan - 08/29/19 1023    Clinical Impression Statement  Today's skilled session continued to focus on strengthening of core and LE's with rest breaks needed. HR max 125 bpm with activity, decreased to low to mid 90's with rest. The pt is making steady progress toward goals and should benefit from continued PT to progress toward unmet goals.    Personal Factors and Comorbidities  Past/Current Experience;Comorbidity 3+    Comorbidities  spinal cord CVA 01/2019, diabetes mellitus, HTN, PCOS, vitamin D deficiency, sp right (2011) and left (2017) hip replacement, peripheral edema, venous insufficiency.    Examination-Activity Limitations  Sit;Squat;Stairs;Stand;Transfers;Locomotion Level    Examination-Participation Restrictions  Meal Prep;Driving;Community Activity    Stability/Clinical Decision Making  Evolving/Moderate complexity    Rehab Potential  Good    PT Frequency  Other (comment)   3x/wk for 4 weeks, then 2x/wk for 4 weeks (per recert [id number]   PT Duration  8 weeks    PT Treatment/Interventions  ADLs/Self Care Home Management;DME Instruction;Gait training;Stair training;Functional mobility  training;Therapeutic activities;Therapeutic exercise;Balance training;Patient/family education;Neuromuscular re-education;Energy conservation;Aquatic Therapy;Orthotic Fit/Training    PT Next Visit Plan  Trunk, R hip abduction strengthening (lateral trunk and trunk rotation strengthening), gait training for improved step length, improved posture, trunk control with gait    PT Home Exercise Plan  [id number] - plus seated hamstring stretch and seated forward flexion low back stretch, supine hip flexor stretch    Consulted and Agree with Plan of Care  Patient       Patient will benefit from skilled therapeutic intervention in order to improve the following deficits and impairments:  Abnormal gait, Decreased activity tolerance, Decreased balance, Decreased endurance, Decreased coordination, Decreased mobility, Decreased range of motion, Difficulty walking, Decreased strength, Impaired sensation  Visit Diagnosis: Difficulty in walking, not elsewhere classified  Unsteadiness on feet  Muscle weakness (generalized)  Other symptoms and signs involving the nervous system     Problem List Patient Active Problem List   Diagnosis Date Noted  . Dysesthesia 03/29/2019  .  Gait disturbance 03/29/2019  . Spinal cord infarction ([facility]) 02/20/2019  . Cauda equina syndrome ([place]) 02/13/2019  . DM2 (diabetes mellitus, type 2) ([place]) 02/13/2019  . Syncope 02/13/2019  . Essential hypertension 02/13/2019  . Class 3 severe obesity with serious comorbidity and body mass index (BMI) of 45.0 to 49.9 in adult ([place]) 10/04/2017  . Class 3 severe obesity without serious comorbidity with body mass index (BMI) of 45.0 to 49.9 in adult ([place]) 04/12/2017  . Vitamin D deficiency 04/12/2017  . Other fatigue 12/31/2016  . SOB (shortness of breath) on exertion 12/31/2016  . Type 2 diabetes mellitus without complication, without long-term current use of insulin ([place]) 12/31/2016  . Morbid obesity ([place]) 12/31/2016  . OA  (osteoarthritis) of hip 12/02/2015    Willow Ora, PTA, [facility] [address], [place], [place] [address] [phone] 08/30/19, 10:35 AM   Name: Laura Blackwell MRN: [id number] Date of Birth: 17-Apr-1956

## 2019-09-04 ENCOUNTER — Ambulatory Visit: Payer: 59 | Attending: Family Medicine | Admitting: Physical Therapy

## 2019-09-04 ENCOUNTER — Encounter: Payer: Self-pay | Admitting: Physical Therapy

## 2019-09-04 ENCOUNTER — Other Ambulatory Visit: Payer: Self-pay

## 2019-09-04 DIAGNOSIS — M6281 Muscle weakness (generalized): Secondary | ICD-10-CM | POA: Diagnosis present

## 2019-09-04 DIAGNOSIS — R29818 Other symptoms and signs involving the nervous system: Secondary | ICD-10-CM | POA: Insufficient documentation

## 2019-09-04 DIAGNOSIS — R2689 Other abnormalities of gait and mobility: Secondary | ICD-10-CM | POA: Diagnosis present

## 2019-09-04 DIAGNOSIS — R2681 Unsteadiness on feet: Secondary | ICD-10-CM | POA: Diagnosis present

## 2019-09-04 DIAGNOSIS — R262 Difficulty in walking, not elsewhere classified: Secondary | ICD-10-CM | POA: Diagnosis not present

## 2019-09-04 NOTE — Therapy (Signed)
[facility] [address] [place], [place], [address] Phone: [phone]   Fax:  [phone]  Physical Therapy Treatment  Patient Details  Name: Laura Blackwell MRN: [id number] Date of Birth: Dec 13, 1955 Referring Provider (PT): Carol Ada, MD   Encounter Date: 09/04/2019  PT End of Session - 09/04/19 1204    Visit Number  40    Number of Visits  56   per recert [id number]   Date for PT Re-Evaluation  11/16/19    Authorization Type  UHC    Authorization - Visit Number  40    Authorization - Number of Visits  60    PT Start Time  0800    PT Stop Time  0843    PT Time Calculation (min)  43 min    Equipment Utilized During Treatment  Gait belt    Activity Tolerance  Patient tolerated treatment well;No increased pain    Behavior During Therapy  WFL for tasks assessed/performed       Past Medical History:  Diagnosis Date  . Arthritis   . Diabetes mellitus without complication ([facility] [facility])   . Edema    in left leg in 1980s on left ankle   . Hyperlipidemia   . Hypertension   . PCOS (polycystic ovarian syndrome)   . Superficial thrombophlebitis   . Swelling   . Venous insufficiency   . Vitamin D deficiency     Past Surgical History:  Procedure Laterality Date  . BREAST BIOPSY Right   . JOINT REPLACEMENT  2011    right hip   . left ankle surgery     . TOTAL HIP ARTHROPLASTY Left 12/02/2015   Procedure: LEFT TOTAL HIP ARTHROPLASTY ANTERIOR APPROACH;  Surgeon: [name], MD;  Location: [facility];  Service: Orthopedics;  Laterality: Left;    There were no vitals filed for this visit.  Subjective Assessment - 09/04/19 0804    Subjective  Still doesn't feel totally steady on her feet - because of the numbness in her feet.    Pertinent History  spinal cord CVA 01/2019, diabetes mellitus, HTN, PCOS, vitamin D deficiency, sp right (2011) and left (2017) hip replacement, peripheral edema, venous insufficiency.    How  long can you walk comfortably?  only household distances, states could not walk around track in therapy gym with cane    Diagnostic tests  MRI showed an abnormal focus in the conus medullaris and lower spinal cord adjacent to T12-L1.    Patient Stated Goals  "wants to be 99.99% better, wants to be the best she can be    Currently in Pain?  No/denies    Pain Onset  1 to 4 weeks ago                       [facility] Adult PT Treatment/Exercise - 09/04/19 0001      Ambulation/Gait   Ambulation/Gait  Yes    Ambulation/Gait Assistance  5: Supervision    Ambulation/Gait Assistance Details  without AD in between activities and throughout session    Ambulation Distance (Feet)  115 Feet    Assistive device  None    Gait Pattern  Step-through pattern;Decreased stride length;Decreased stance time - left;Decreased step length - right;Decreased weight shift to left;Decreased trunk rotation;Narrow base of support;Lateral hip instability;Trendelenburg    Ambulation Surface  Level;Indoor      Exercises   Exercises  Other Exercises    Other Exercises   Supine  on mat table: with knees bent at 90 degrees over red physioball lower trunk rotations 2 x 10 reps with focus on core activation and control, with BLE extended on ball 3 x 5 reps bridging, 2 x 10 reps ball pull out and pull ins with LE - cues for breathing and core activation. Standing at edge of mat with single RUE support on chair: step outs with LLE with focus on RLE hip abduction, use of green theraband around distal thighs 4 x 5 reps, therapist providing manual facilitation for technique and to help prevent trunk lean. Standing at // bars with green theraband around ankles, side stepping down and back 3 reps. Closed chain hip ABD on 4" step on R, 2 x 10 reps verbal and demonstrative cues for technique, need for single UE support on // bars.              PT Education - 09/04/19 1204    Education Details  verbally reviewed IT band  stretch as well as visual demonstration as pt reports she has not been performing at home.    Person(s) Educated  Patient    Methods  Explanation;Demonstration    Comprehension  Verbalized understanding       PT Short Term Goals - 08/18/19 1416      PT SHORT TERM GOAL #1   Title  Patient will be independent with progression of HEP with focus on strength and balance in order to build upon functional gains in therapy. ALL STGs DUE 09/15/2019.    Time  4    Period  Weeks    Status  On-going    Target Date  09/15/19      PT SHORT TERM GOAL #2   Title  Patient will improve DGI to at least 19/24 for decreased fall risk.    Baseline  DGI 16/24 08/18/2019    Time  4    Period  Weeks    Status  Revised    Target Date  09/15/19      PT SHORT TERM GOAL #3   Title  Pt will ambulate at least 550 ft in 3 minute walk test, with cane, for improved gait endurance and efficiency.    Baseline  507 ft in 3 minute walk 08/18/2019    Time  4    Period  Weeks    Status  Achieved    Target Date  09/15/19      PT SHORT TERM GOAL #4   Title  Pt will ambulate household distances, 50-100 ft, no device with minimal to no R lateral hip sway, for improved return to independent mobility.    Time  4    Period  Weeks    Status  New    Target Date  09/15/19        PT Long Term Goals - 08/18/19 1419      PT LONG TERM GOAL #1   Title  Patient will be independent with final HEP with focus on strength and balance in order to build upon functional gains in therapy. ALL LTGs DUE 10/13/2019.    Time  8    Period  Weeks    Status  On-going    Target Date  10/13/19      PT LONG TERM GOAL #2   Title  Pt will improve DGI score to at least 21/24 for decreased fall risk.    Baseline  DGI 13/24 06/27/2019; 16/24 (no cane) 08/18/2019    Time  8    Period  Weeks    Status  Revised    Target Date  10/13/19      PT LONG TERM GOAL #3   Title  Pt will negotiate curb step and obstacle, no cane, independently, to  demonstrate improved strength and balance for improved mobility.    Baseline  uses HHA for obstacle negoitation, curb with cane    Time  8    Period  Weeks    Status  New    Target Date  10/13/19      PT LONG TERM GOAL #4   Title  Pt will improve 3 MWT with cane to at least 600 ft, for improved gait endurance and efficiency.    Baseline  396 ft 06/27/2019; 507 ft 08/18/2019    Time  8    Period  Weeks    Status  Revised    Target Date  10/13/19      PT LONG TERM GOAL #5   Title  Patient will ambulate at least 800'modified independently, over indoor level/outdoor unlevel surfaces using cane with step through gait pattern in order to improve community mobility.    Time  8    Period  Weeks    Status  On-going    Target Date  10/13/19            Plan - 09/04/19 1206    Clinical Impression Statement  Focus of today's skilled session was continued strengthening of LEs (primary focus of R hip ABD) and core for improved functional mobility and gait. Pt continues to demonstrate a right hip drop, especially when fatigued. max HR 115-117 bpm with activity today. Pt tolerated session well - will continue to progress towards LTGs.    Personal Factors and Comorbidities  Past/Current Experience;Comorbidity 3+    Comorbidities  spinal cord CVA 01/2019, diabetes mellitus, HTN, PCOS, vitamin D deficiency, sp right (2011) and left (2017) hip replacement, peripheral edema, venous insufficiency.    Examination-Activity Limitations  Sit;Squat;Stairs;Stand;Transfers;Locomotion Level    Examination-Participation Restrictions  Meal Prep;Driving;Community Activity    Stability/Clinical Decision Making  Evolving/Moderate complexity    Rehab Potential  Good    PT Frequency  Other (comment)   3x/wk for 4 weeks, then 2x/wk for 4 weeks (per recert [id number]   PT Duration  8 weeks    PT Treatment/Interventions  ADLs/Self Care Home Management;DME Instruction;Gait training;Stair training;Functional mobility  training;Therapeutic activities;Therapeutic exercise;Balance training;Patient/family education;Neuromuscular re-education;Energy conservation;Aquatic Therapy;Orthotic Fit/Training    PT Next Visit Plan  forward step ups/stair training (pt still heavily reliant on BUE) Trunk, R hip abduction strengthening (lateral trunk and trunk rotation strengthening), gait training for improved step length, improved posture, trunk control with gait    PT Home Exercise Plan  [id number] - plus seated hamstring stretch and seated forward flexion low back stretch, supine hip flexor stretch    Consulted and Agree with Plan of Care  Patient       Patient will benefit from skilled therapeutic intervention in order to improve the following deficits and impairments:  Abnormal gait, Decreased activity tolerance, Decreased balance, Decreased endurance, Decreased coordination, Decreased mobility, Decreased range of motion, Difficulty walking, Decreased strength, Impaired sensation  Visit Diagnosis: Difficulty in walking, not elsewhere classified  Unsteadiness on feet  Muscle weakness (generalized)     Problem List Patient Active Problem List   Diagnosis Date Noted  . Dysesthesia 03/29/2019  . Gait disturbance 03/29/2019  . Spinal cord infarction ([place]) 02/20/2019  . Cauda equina  syndrome ([place]) 02/13/2019  . DM2 (diabetes mellitus, type 2) ([place]) 02/13/2019  . Syncope 02/13/2019  . Essential hypertension 02/13/2019  . Class 3 severe obesity with serious comorbidity and body mass index (BMI) of 45.0 to 49.9 in adult ([place]) 10/04/2017  . Class 3 severe obesity without serious comorbidity with body mass index (BMI) of 45.0 to 49.9 in adult ([place]) 04/12/2017  . Vitamin D deficiency 04/12/2017  . Other fatigue 12/31/2016  . SOB (shortness of breath) on exertion 12/31/2016  . Type 2 diabetes mellitus without complication, without long-term current use of insulin ([place]) 12/31/2016  . Morbid obesity ([place]) 12/31/2016  . OA  (osteoarthritis) of hip 12/02/2015    Arliss Journey, PT, DPT 09/04/2019, 12:11 PM  [facility] [address] [place], [place], [address] Phone: [phone]   Fax:  [phone]  Name: Laura Blackwell MRN: [id number] Date of Birth: [birth year]

## 2019-09-06 ENCOUNTER — Encounter: Payer: Self-pay | Admitting: Physical Therapy

## 2019-09-06 ENCOUNTER — Other Ambulatory Visit: Payer: Self-pay

## 2019-09-06 ENCOUNTER — Ambulatory Visit: Payer: 59 | Admitting: Physical Therapy

## 2019-09-06 DIAGNOSIS — R262 Difficulty in walking, not elsewhere classified: Secondary | ICD-10-CM

## 2019-09-06 DIAGNOSIS — R29818 Other symptoms and signs involving the nervous system: Secondary | ICD-10-CM

## 2019-09-06 DIAGNOSIS — R2681 Unsteadiness on feet: Secondary | ICD-10-CM

## 2019-09-06 DIAGNOSIS — M6281 Muscle weakness (generalized): Secondary | ICD-10-CM

## 2019-09-08 ENCOUNTER — Ambulatory Visit: Payer: 59 | Admitting: Physical Therapy

## 2019-09-08 NOTE — Therapy (Addendum)
[facility] [address] [place], [place], [address] Phone: [phone]   Fax:  [phone]  Physical Therapy Treatment  Patient Details  Name: Laura Blackwell MRN: [id number] Date of Birth: 1955-11-18 Referring Provider (PT): Carol Ada, MD   Encounter Date: 09/06/2019    09/06/19 1454  PT Visits / Re-Eval  Visit Number 41  Number of Visits 56 (per recert [id number])  Date for PT Re-Evaluation 11/16/19  Authorization  Authorization Type UHC  Authorization - Visit Number 41  Authorization - Number of Visits 60  PT Time Calculation  PT Start Time [id number]  PT Stop Time 1530  PT Time Calculation (min) 42 min  PT - End of Session  Equipment Utilized During Treatment Gait belt  Activity Tolerance Patient tolerated treatment well;No increased pain  Behavior During Therapy WFL for tasks assessed/performed     Past Medical History:  Diagnosis Date  . Arthritis   . Diabetes mellitus without complication ([place])   . Edema    in left leg in 1980s on left ankle   . Hyperlipidemia   . Hypertension   . PCOS (polycystic ovarian syndrome)   . Superficial thrombophlebitis   . Swelling   . Venous insufficiency   . Vitamin D deficiency     Past Surgical History:  Procedure Laterality Date  . BREAST BIOPSY Right   . JOINT REPLACEMENT  2011    right hip   . left ankle surgery     . TOTAL HIP ARTHROPLASTY Left 12/02/2015   Procedure: LEFT TOTAL HIP ARTHROPLASTY ANTERIOR APPROACH;  Surgeon: [name], MD;  Location: [facility];  Service: Orthopedics;  Laterality: Left;    There were no vitals filed for this visit.     09/06/19 1451  Symptoms/Limitations  Subjective No complaints. Reporting increased sensitivity to touch, fabrics, etc from buttocks down to her calves. "I just want to take everything off". Reports it being "sensative", "Like it's raw".  Pertinent History spinal cord CVA 01/2019, diabetes mellitus,  HTN, PCOS, vitamin D deficiency, sp right (2011) and left (2017) hip replacement, peripheral edema, venous insufficiency.  How long can you walk comfortably? only household distances, states could not walk around track in therapy gym with cane  Diagnostic tests MRI showed an abnormal focus in the conus medullaris and lower spinal cord adjacent to T12-L1.  Patient Stated Goals "wants to be 99.99% better, wants to be the best she can be  Pain Assessment  Currently in Pain? No/denies      09/06/19 1455  Transfers  Transfers Sit to Stand;Stand to Sit  Sit to Stand 6: Modified independent (Device/Increase time);With upper extremity assist;Without upper extremity assist;From chair/3-in-1;From bed  Stand to Sit 6: Modified independent (Device/Increase time);Without upper extremity assist;With upper extremity assist;To bed;To chair/3-in-1  Ambulation/Gait  Ambulation/Gait Yes  Ambulation/Gait Assistance 5: Supervision  Ambulation/Gait Assistance Details use of cane Mod I to enter/exit the gym. no AD used in session with supervision, antalgic/trendelenburg gait noted  Ambulation Distance (Feet)  (around gym with no AD with session)  Assistive device None  Gait Pattern Step-through pattern;Decreased stride length;Decreased stance time - left;Decreased step length - right;Decreased weight shift to left;Decreased trunk rotation;Narrow base of support;Lateral hip instability;Trendelenburg  Ambulation Surface Level;Indoor  Knee/Hip Exercises: Aerobic  Other Aerobic Scifit UE/LE's on level 4.0 for 6 minutes with goal >/=60-70 rpm for strengthening of lower extremities.  HR  110 bpm following SciFit  Knee/Hip Exercises: Standing  Other Standing Knee Exercises seated at  edge of mat with green band around knees- sit<>stands with emphasis on keeping band pulled tight, 2 sets of 10 reps.   Other Standing Knee Exercises with green band resistance: 4 way hip kicks x 10 reps each direction on each LE. rest breaks  needed to allow HR to decrease.       PT Short Term Goals - 08/18/19 1416      PT SHORT TERM GOAL #1   Title  Patient will be independent with progression of HEP with focus on strength and balance in order to build upon functional gains in therapy. ALL STGs DUE 09/15/2019.    Time  4    Period  Weeks    Status  On-going    Target Date  09/15/19      PT SHORT TERM GOAL #2   Title  Patient will improve DGI to at least 19/24 for decreased fall risk.    Baseline  DGI 16/24 08/18/2019    Time  4    Period  Weeks    Status  Revised    Target Date  09/15/19      PT SHORT TERM GOAL #3   Title  Pt will ambulate at least 550 ft in 3 minute walk test, with cane, for improved gait endurance and efficiency.    Baseline  507 ft in 3 minute walk 08/18/2019    Time  4    Period  Weeks    Status  Achieved    Target Date  09/15/19      PT SHORT TERM GOAL #4   Title  Pt will ambulate household distances, 50-100 ft, no device with minimal to no R lateral hip sway, for improved return to independent mobility.    Time  4    Period  Weeks    Status  New    Target Date  09/15/19        PT Long Term Goals - 08/18/19 1419      PT LONG TERM GOAL #1   Title  Patient will be independent with final HEP with focus on strength and balance in order to build upon functional gains in therapy. ALL LTGs DUE 10/13/2019.    Time  8    Period  Weeks    Status  On-going    Target Date  10/13/19      PT LONG TERM GOAL #2   Title  Pt will improve DGI score to at least 21/24 for decreased fall risk.    Baseline  DGI 13/24 06/27/2019; 16/24 (no cane) 08/18/2019    Time  8    Period  Weeks    Status  Revised    Target Date  10/13/19      PT LONG TERM GOAL #3   Title  Pt will negotiate curb step and obstacle, no cane, independently, to demonstrate improved strength and balance for improved mobility.    Baseline  uses HHA for obstacle negoitation, curb with cane    Time  8    Period  Weeks    Status   New    Target Date  10/13/19      PT LONG TERM GOAL #4   Title  Pt will improve 3 MWT with cane to at least 600 ft, for improved gait endurance and efficiency.    Baseline  396 ft 06/27/2019; 507 ft 08/18/2019    Time  8    Period  Weeks    Status  Revised  Target Date  10/13/19      PT LONG TERM GOAL #5   Title  Patient will ambulate at least 800'modified independently, over indoor level/outdoor unlevel surfaces using cane with step through gait pattern in order to improve community mobility.    Time  8    Period  Weeks    Status  On-going    Target Date  10/13/19         09/06/19 1701  Plan  Clinical Impression Statement Today's skilled session continued to focus on activity tolerance and LE strengthening with rest breaks needed to allow heart rate to decreased. HR max 126 bpm with acivities. The pt is progressing toward goals and should benefit from continued PT to progress toward unmet goals.  Personal Factors and Comorbidities Past/Current Experience;Comorbidity 3+  Comorbidities spinal cord CVA 01/2019, diabetes mellitus, HTN, PCOS, vitamin D deficiency, sp right (2011) and left (2017) hip replacement, peripheral edema, venous insufficiency.  Examination-Activity Limitations Sit;Squat;Stairs;Stand;Transfers;Locomotion Level  Examination-Participation Restrictions Meal Prep;Driving;Community Activity  Pt will benefit from skilled therapeutic intervention in order to improve on the following deficits Abnormal gait;Decreased activity tolerance;Decreased balance;Decreased endurance;Decreased coordination;Decreased mobility;Decreased range of motion;Difficulty walking;Decreased strength;Impaired sensation  Stability/Clinical Decision Making Evolving/Moderate complexity  Rehab Potential Good  PT Frequency Other (comment) (3x/wk for 4 weeks, then 2x/wk for 4 weeks (per recert [id number])  PT Duration 8 weeks  PT Treatment/Interventions ADLs/Self Care Home Management;DME  Instruction;Gait training;Stair training;Functional mobility training;Therapeutic activities;Therapeutic exercise;Balance training;Patient/family education;Neuromuscular re-education;Energy conservation;Aquatic Therapy;Orthotic Fit/Training  PT Next Visit Plan forward step ups/stair training (pt still heavily reliant on BUE) Trunk, R hip abduction strengthening (lateral trunk and trunk rotation strengthening), gait training for improved step length, improved posture, trunk control with gait  PT Home Exercise Plan [email or web address] - plus seated hamstring stretch and seated forward flexion low back stretch, supine hip flexor stretch  Consulted and Agree with Plan of Care Patient          Patient will benefit from skilled therapeutic intervention in order to improve the following deficits and impairments:     Visit Diagnosis: Difficulty in walking, not elsewhere classified  Unsteadiness on feet  Muscle weakness (generalized)  Other symptoms and signs involving the nervous system     Problem List Patient Active Problem List   Diagnosis Date Noted  . Dysesthesia 03/29/2019  . Gait disturbance 03/29/2019  . Spinal cord infarction ([place]) 02/20/2019  . Cauda equina syndrome ([place]) 02/13/2019  . DM2 (diabetes mellitus, type 2) ([place]) 02/13/2019  . Syncope 02/13/2019  . Essential hypertension 02/13/2019  . Class 3 severe obesity with serious comorbidity and body mass index (BMI) of 45.0 to 49.9 in adult ([place]) 10/04/2017  . Class 3 severe obesity without serious comorbidity with body mass index (BMI) of 45.0 to 49.9 in adult ([place]) 04/12/2017  . Vitamin D deficiency 04/12/2017  . Other fatigue 12/31/2016  . SOB (shortness of breath) on exertion 12/31/2016  . Type 2 diabetes mellitus without complication, without long-term current use of insulin ([place]) 12/31/2016  . Morbid obesity ([place]) 12/31/2016  . OA (osteoarthritis) of hip 12/02/2015    Willow Ora, PTA, [facility] [address], [place], [place] [address] [phone] 09/08/19, 4:46 PM   Name: DARISHA OSTERMEYER MRN: [id number] Date of Birth: 1956/07/04

## 2019-09-11 ENCOUNTER — Encounter: Payer: Self-pay | Admitting: Physical Therapy

## 2019-09-11 ENCOUNTER — Other Ambulatory Visit: Payer: Self-pay

## 2019-09-11 ENCOUNTER — Ambulatory Visit: Payer: 59 | Admitting: Physical Therapy

## 2019-09-11 DIAGNOSIS — R2681 Unsteadiness on feet: Secondary | ICD-10-CM

## 2019-09-11 DIAGNOSIS — R2689 Other abnormalities of gait and mobility: Secondary | ICD-10-CM

## 2019-09-11 DIAGNOSIS — M6281 Muscle weakness (generalized): Secondary | ICD-10-CM

## 2019-09-11 DIAGNOSIS — R262 Difficulty in walking, not elsewhere classified: Secondary | ICD-10-CM | POA: Diagnosis not present

## 2019-09-11 NOTE — Therapy (Signed)
[facility] [address] [place], [place], [address] Phone: [phone]   Fax:  [phone]  Physical Therapy Treatment  Patient Details  Name: Laura Blackwell MRN: [id number] Date of Birth: 11/04/1955 Referring Provider (PT): Carol Ada, MD   Encounter Date: 09/11/2019  PT End of Session - 09/11/19 1704    Visit Number  42   Previous visit number should have been 41   Number of Visits  56   per recert [id number]   Date for PT Re-Evaluation  11/16/19    Authorization Type  UHC:  60 VL PT/OT/ST    Authorization - Visit Number  25    Authorization - Number of Visits  60    PT Start Time  0848    PT Stop Time  0931    PT Time Calculation (min)  43 min    Activity Tolerance  Patient tolerated treatment well;No increased pain    Behavior During Therapy  WFL for tasks assessed/performed       Past Medical History:  Diagnosis Date  . Arthritis   . Diabetes mellitus without complication ([place])   . Edema    in left leg in 1980s on left ankle   . Hyperlipidemia   . Hypertension   . PCOS (polycystic ovarian syndrome)   . Superficial thrombophlebitis   . Swelling   . Venous insufficiency   . Vitamin D deficiency     Past Surgical History:  Procedure Laterality Date  . BREAST BIOPSY Right   . JOINT REPLACEMENT  2011    right hip   . left ankle surgery     . TOTAL HIP ARTHROPLASTY Left 12/02/2015   Procedure: LEFT TOTAL HIP ARTHROPLASTY ANTERIOR APPROACH;  Surgeon: [name], MD;  Location: [facility];  Service: Orthopedics;  Laterality: Left;    There were no vitals filed for this visit.  Subjective Assessment - 09/11/19 0850    Subjective  Saw one of the doctors at [facility] and they gave me a prescription for Gabapentin (not sure what dose).  Started on Saturday and that has helped.  Feel like I'm limping just a little less.    Pertinent History  spinal cord CVA 01/2019, diabetes mellitus, HTN, PCOS, vitamin D  deficiency, sp right (2011) and left (2017) hip replacement, peripheral edema, venous insufficiency.    How long can you walk comfortably?  only household distances, states could not walk around track in therapy gym with cane    Diagnostic tests  MRI showed an abnormal focus in the conus medullaris and lower spinal cord adjacent to T12-L1.    Patient Stated Goals  "wants to be 99.99% better, wants to be the best she can be    Currently in Pain?  No/denies                       [facility] Adult PT Treatment/Exercise - 09/11/19 0001      Transfers   Transfers  Sit to Stand;Stand to Sit    Sit to Stand  6: Modified independent (Device/Increase time);With upper extremity assist;Without upper extremity assist;From chair/3-in-1;From bed    Stand to Sit  6: Modified independent (Device/Increase time);Without upper extremity assist;With upper extremity assist;To bed;To chair/3-in-1    Comments  At least 8 reps from mat, throughout session, minimal to no UE support      Ambulation/Gait   Ambulation/Gait  Yes    Ambulation/Gait Assistance  5: Supervision  Ambulation/Gait Assistance Details  use of cane to enter/exit gym modified independently.  Gait with no device throughout session; increased Trendelenburg pattern with fatigue and with longer distances.    Ambulation Distance (Feet)  50 Feet   4 reps; 80 ft x 2; additional gait for DGI   Assistive device  None    Gait Pattern  Step-through pattern;Decreased stride length;Decreased stance time - left;Decreased step length - right;Decreased weight shift to left;Decreased trunk rotation;Narrow base of support;Lateral hip instability;Trendelenburg    Ambulation Surface  Level;Indoor    Stairs  Yes    Stairs Assistance  5: Supervision    Stair Management Technique  Two rails;Forwards;Alternating pattern    Number of Stairs  4    Height of Stairs  6      Standardized Balance Assessment   Standardized Balance Assessment  Dynamic Gait  Index   Without cane     Dynamic Gait Index   Level Surface  Mild Impairment    Change in Gait Speed  Normal    Gait with Horizontal Head Turns  Mild Impairment    Gait with Vertical Head Turns  Mild Impairment    Gait and Pivot Turn  Normal    Step Over Obstacle  Moderate Impairment    Step Around Obstacles  Normal    Steps  Mild Impairment    Total Score  18          Balance Exercises - 09/11/19 1310      Balance Exercises: Standing   SLS  Eyes open;Solid surface;Upper extremity support 1;3 reps;10 secs    Other Standing Exercises  SLS with foot propped at 4" cabinet shelf, no UE support x 3 reps, 10 seconds.  Alternating heel raises, incorporating knee/hip flexion, 2 sets x 10 reps        PT Education - 09/11/19 1702    Education Details  Additions to HEP; progress towards DGI and short distance gait (with no device goals)    Person(s) Educated  Patient    Methods  Explanation;Demonstration;Handout    Comprehension  Verbalized understanding;Returned demonstration       PT Short Term Goals - 09/11/19 0853      PT SHORT TERM GOAL #1   Title  Patient will be independent with progression of HEP with focus on strength and balance in order to build upon functional gains in therapy. ALL STGs DUE 09/15/2019.    Time  4    Period  Weeks    Status  On-going    Target Date  09/15/19      PT SHORT TERM GOAL #2   Title  Patient will improve DGI to at least 19/24 for decreased fall risk.    Baseline  DGI 16/24 08/18/2019; DGI 18/24 09/11/2019    Time  4    Period  Weeks    Status  Partially Met    Target Date  09/15/19      PT SHORT TERM GOAL #3   Title  Pt will ambulate at least 550 ft in 3 minute walk test, with cane, for improved gait endurance and efficiency.    Baseline  507 ft in 3 minute walk 08/18/2019    Time  4    Period  Weeks    Status  On-going    Target Date  09/15/19      PT SHORT TERM GOAL #4   Title  Pt will ambulate household distances, 50-100 ft, no  device with minimal to  no R lateral hip sway, for improved return to independent mobility.    Baseline  minimal sway 09/11/2019; multiple bouts of 50-174ft in gym    Time  4    Period  Weeks    Status  Achieved    Target Date  09/15/19        PT Long Term Goals - 08/18/19 1419      PT LONG TERM GOAL #1   Title  Patient will be independent with final HEP with focus on strength and balance in order to build upon functional gains in therapy. ALL LTGs DUE 10/13/2019.    Time  8    Period  Weeks    Status  On-going    Target Date  10/13/19      PT LONG TERM GOAL #2   Title  Pt will improve DGI score to at least 21/24 for decreased fall risk.    Baseline  DGI 13/24 06/27/2019; 16/24 (no cane) 08/18/2019    Time  8    Period  Weeks    Status  Revised    Target Date  10/13/19      PT LONG TERM GOAL #3   Title  Pt will negotiate curb step and obstacle, no cane, independently, to demonstrate improved strength and balance for improved mobility.    Baseline  uses HHA for obstacle negoitation, curb with cane    Time  8    Period  Weeks    Status  New    Target Date  10/13/19      PT LONG TERM GOAL #4   Title  Pt will improve 3 MWT with cane to at least 600 ft, for improved gait endurance and efficiency.    Baseline  396 ft 06/27/2019; 507 ft 08/18/2019    Time  8    Period  Weeks    Status  Revised    Target Date  10/13/19      PT LONG TERM GOAL #5   Title  Patient will ambulate at least 800'modified independently, over indoor level/outdoor unlevel surfaces using cane with step through gait pattern in order to improve community mobility.    Time  8    Period  Weeks    Status  On-going    Target Date  10/13/19            Plan - 09/11/19 1706    Clinical Impression Statement  Began assessing STGs, with pt partially meeting STG 2 (improved DGI score from 16/24 to 18/24).  Pt has also met STG 4.  Added exercises to HEP to address SLS and gastroc strengthening for improved  obstacle, step negotiation and push-off phase with gait.  Pt's gait throughout session is with assistive device (short distances, breaks), and pt reports walking around whole grocery store pushing cart (not electric scooter cart).  She appears to have improved step length with gait and is noted to have improved obstacle negotiation without cane as compared to last check of DGI.  She does demo increased lateral hip/trunk sway after 80 ft or so of gait.  She will continue to benefit from skilled PT to address strength, balance and gait towards remaining short and long term goals.    Personal Factors and Comorbidities  Past/Current Experience;Comorbidity 3+    Comorbidities  spinal cord CVA 01/2019, diabetes mellitus, HTN, PCOS, vitamin D deficiency, sp right (2011) and left (2017) hip replacement, peripheral edema, venous insufficiency.    Examination-Activity Limitations  Sit;Squat;Stairs;Stand;Transfers;Locomotion  Level    Examination-Participation Restrictions  Meal Prep;Driving;Community Activity    Stability/Clinical Decision Making  Evolving/Moderate complexity    Rehab Potential  Good    PT Frequency  Other (comment)   3x/wk for 4 weeks, then 2x/wk for 4 weeks (per recert 50/27/7412   PT Duration  8 weeks    PT Treatment/Interventions  ADLs/Self Care Home Management;DME Instruction;Gait training;Stair training;Functional mobility training;Therapeutic activities;Therapeutic exercise;Balance training;Patient/family education;Neuromuscular re-education;Energy conservation;Aquatic Therapy;Orthotic Fit/Training    PT Next Visit Plan  Review additions to HEP this visit; check 3 MWT (had not been previously checked); check with pt about POC (currently she is scheduled at 3x/wk, but 2nd 4 weeks of POC is 2x/wk); obstacle and step negotiation.  Try foot propped at step with UE lifts    PT Home Exercise Plan  [email or web address] - plus seated hamstring stretch and seated forward flexion low back stretch, supine hip  flexor stretch    Consulted and Agree with Plan of Care  Patient       Patient will benefit from skilled therapeutic intervention in order to improve the following deficits and impairments:  Abnormal gait, Decreased activity tolerance, Decreased balance, Decreased endurance, Decreased coordination, Decreased mobility, Decreased range of motion, Difficulty walking, Decreased strength, Impaired sensation  Visit Diagnosis: Other abnormalities of gait and mobility  Muscle weakness (generalized)  Unsteadiness on feet     Problem List Patient Active Problem List   Diagnosis Date Noted  . Dysesthesia 03/29/2019  . Gait disturbance 03/29/2019  . Spinal cord infarction ([place]) 02/20/2019  . Cauda equina syndrome ([place]) 02/13/2019  . DM2 (diabetes mellitus, type 2) ([place]) 02/13/2019  . Syncope 02/13/2019  . Essential hypertension 02/13/2019  . Class 3 severe obesity with serious comorbidity and body mass index (BMI) of 45.0 to 49.9 in adult ([place]) 10/04/2017  . Class 3 severe obesity without serious comorbidity with body mass index (BMI) of 45.0 to 49.9 in adult ([place]) 04/12/2017  . Vitamin D deficiency 04/12/2017  . Other fatigue 12/31/2016  . SOB (shortness of breath) on exertion 12/31/2016  . Type 2 diabetes mellitus without complication, without long-term current use of insulin ([place]) 12/31/2016  . Morbid obesity ([place]) 12/31/2016  . OA (osteoarthritis) of hip 12/02/2015    Taniah Reinecke W. 09/11/2019, 5:17 PM  Frazier Butt., PT   [facility] [address] [place], [place], [address] Phone: [phone]   Fax:  [phone]  Name: ESTHER BRADSTREET MRN: [id number] Date of Birth: [birth year]

## 2019-09-11 NOTE — Patient Instructions (Addendum)
Access Code: [email or web address]  URL: [email or web address]  Date: 09/11/2019  Prepared by: Mady Haagensen   Exercises Standing Hip Abduction with Counter Support - 5 reps - 3 sets - 2x daily - 7x weekly Standing Marching - 10 reps - 2 sets - 2x daily - 7x weekly Seated Ankle Plantar Flexion with Resistance Loop - 10 reps - 2-3 sets - 3 sec hold - 1x daily - 7x weekly Mini Squat - 10 reps - 2 sets - 1x daily - 7x weekly Clamshell - 10 reps - 2 sets - 2x daily - 7x weekly Supine Diaphragmatic Breathing - 10 reps - 3 sets - 1x daily - 7x weekly Backward Walking with Counter Support - 2 reps - 2x daily - 7x weekly Tandem Walking with Counter Support - 2 sets - 2x daily - 7x weekly Side Stepping with Resistance at Thighs - 2 sets - 2x daily - 7x weekly ITB Stretch at Wall - 3 reps - 1 sets - 30 sec hold - 1x daily - 5x weekly  Added 09/11/2019: Single Leg Stance with Support - 3 reps - 1 sets - 10 sec hold - 2x daily - 7x weekly Step Taps on Low Step - 3 reps - 1 sets - 3 sec hold - 1-2x daily - 7x weekly Alternating Heel Raises - 10 reps - 2 sets - 1x daily - 5x weekly

## 2019-09-13 ENCOUNTER — Ambulatory Visit: Payer: Self-pay | Admitting: Physical Therapy

## 2019-09-15 ENCOUNTER — Other Ambulatory Visit: Payer: Self-pay

## 2019-09-15 ENCOUNTER — Other Ambulatory Visit: Payer: Self-pay | Admitting: Family Medicine

## 2019-09-15 ENCOUNTER — Ambulatory Visit: Payer: 59 | Admitting: Physical Therapy

## 2019-09-15 DIAGNOSIS — Z1231 Encounter for screening mammogram for malignant neoplasm of breast: Secondary | ICD-10-CM

## 2019-09-15 DIAGNOSIS — R262 Difficulty in walking, not elsewhere classified: Secondary | ICD-10-CM

## 2019-09-15 DIAGNOSIS — R2681 Unsteadiness on feet: Secondary | ICD-10-CM

## 2019-09-15 DIAGNOSIS — M6281 Muscle weakness (generalized): Secondary | ICD-10-CM

## 2019-09-15 DIAGNOSIS — R2689 Other abnormalities of gait and mobility: Secondary | ICD-10-CM

## 2019-09-15 NOTE — Therapy (Signed)
[facility] [address] [place], [place], [address] Phone: [phone]   Fax:  [phone]  Physical Therapy Treatment  Patient Details  Name: Laura Blackwell MRN: [id number] Date of Birth: 1955-10-08 Referring Provider (PT): Carol Ada, MD   Encounter Date: 09/15/2019  PT End of Session - 09/15/19 1019    Visit Number  43    Number of Visits  56   per recert 73/53/2992   Date for PT Re-Evaluation  11/16/19    Authorization Type  UHC:  60 VL PT/OT/ST    Authorization - Visit Number  24    Authorization - Number of Visits  60    PT Start Time  0930    PT Stop Time  1013    PT Time Calculation (min)  43 min    Equipment Utilized During Treatment  Gait belt    Activity Tolerance  Patient tolerated treatment well;No increased pain    Behavior During Therapy  WFL for tasks assessed/performed       Past Medical History:  Diagnosis Date  . Arthritis   . Diabetes mellitus without complication ([place])   . Edema    in left leg in 1980s on left ankle   . Hyperlipidemia   . Hypertension   . PCOS (polycystic ovarian syndrome)   . Superficial thrombophlebitis   . Swelling   . Venous insufficiency   . Vitamin D deficiency     Past Surgical History:  Procedure Laterality Date  . BREAST BIOPSY Right   . JOINT REPLACEMENT  2011    right hip   . left ankle surgery     . TOTAL HIP ARTHROPLASTY Left 12/02/2015   Procedure: LEFT TOTAL HIP ARTHROPLASTY ANTERIOR APPROACH;  Surgeon: [name], MD;  Location: [facility];  Service: Orthopedics;  Laterality: Left;    There were no vitals filed for this visit.  Subjective Assessment - 09/15/19 0939    Subjective  Taking Gabapentin 2x day, even though prescribed for 3x a day for 100 mg each. Feels like her hip drop is getting better.    Pertinent History  spinal cord CVA 01/2019, diabetes mellitus, HTN, PCOS, vitamin D deficiency, sp right (2011) and left (2017) hip  replacement, peripheral edema, venous insufficiency.    How long can you walk comfortably?  only household distances, states could not walk around track in therapy gym with cane    Diagnostic tests  MRI showed an abnormal focus in the conus medullaris and lower spinal cord adjacent to T12-L1.    Patient Stated Goals  "wants to be 99.99% better, wants to be the best she can be    Currently in Pain?  No/denies         [facility] PT Assessment - 09/15/19 0941      Ambulation/Gait   Gait Comments  2nd trial of 3MWT: resting HR: 96 bpm O2: 98%, 493' with HR at 129 bpm, O2 at 96% with BORG at 13      6 Minute Walk- Baseline   6 Minute Walk- Baseline  yes    HR (bpm)  92    02 Sat (%RA)  98 %    Modified Borg Scale for Dyspnea  0- Nothing at all      6 Minute walk- Post Test   6 Minute Walk Post Test  yes    HR (bpm)  120    02 Sat (%RA)  96 %    Modified Borg Scale  for Dyspnea  3- Moderate shortness of breath or breathing difficulty    Perceived Rate of Exertion (Borg)  13- Somewhat hard      6 minute walk test results    Aerobic Endurance Distance Walked  403    Endurance additional comments  with SPC, 3 MWT                 Reviewed new additions to HEP (added at last session): Single Leg Stance with Support - 3 reps - 1 sets - 10 sec hold - 2x daily - 7x weekly Step Taps on Low Step - 3 reps - 1 sets - 3 sec hold - 1-2x daily - 7x weekly Alternating Heel Raises - 10 reps - 2 sets - 1x daily - 5x weekly    [facility] Adult PT Treatment/Exercise - 09/15/19 0941      Ambulation/Gait   Ambulation/Gait  Yes    Ambulation/Gait Assistance  5: Supervision    Ambulation/Gait Assistance Details  use of cane to enter/exit session and for 3MWT - mod I. supervision with no AD between activities in session. continued to note R hip drop when fatigued with longer distances of gait    Ambulation Distance (Feet)  50 Feet    Assistive device  None    Gait Pattern  Step-through  pattern;Decreased stride length;Decreased stance time - left;Decreased step length - right;Decreased weight shift to left;Decreased trunk rotation;Narrow base of support;Lateral hip instability;Trendelenburg    Ambulation Surface  Level;Indoor      Therapeutic Activites    Therapeutic Activities  Other Therapeutic Activities    Other Therapeutic Activities  Discussed POC with pt: per re-cert will drop frequency to 2x week for an additional month. pt wishing to attend 2 land PT sessions for the next 4 weeks and cancel pool appointments. Educated importance on performing HEP throughout the week and walking more with her son       Exercises   Other Exercises   At bottom self of countertop: foot propper on lower shelf with 5 reps head turns - 3x each side, need to use BUE for support on countert when standing on LLE, min guard for safety.             PT Education - 09/15/19 1018    Education Details  3MWT distance vs. goal distance, importance of performing HEP at home - revieweed new additions    Person(s) Educated  Patient    Methods  Explanation;Demonstration    Comprehension  Verbalized understanding;Returned demonstration       PT Short Term Goals - 09/15/19 0949      PT SHORT TERM GOAL #1   Title  Patient will be independent with progression of HEP with focus on strength and balance in order to build upon functional gains in therapy. ALL STGs DUE 09/15/2019.    Time  4    Period  Weeks    Status  Partially Met    Target Date  09/15/19      PT SHORT TERM GOAL #2   Title  Patient will improve DGI to at least 19/24 for decreased fall risk.    Baseline  DGI 16/24 08/18/2019; DGI 18/24 09/11/2019    Time  4    Period  Weeks    Status  Partially Met    Target Date  09/15/19      PT SHORT TERM GOAL #3   Title  Pt will ambulate at least 550 ft in 3  minute walk test, with cane, for improved gait endurance and efficiency.    Baseline  507 ft in 3 minute walk 08/18/2019, 493 (2nd  trial) in 3MWT with SPC on 09/15/19 - felt weakness in R hip    Time  4    Period  Weeks    Status  Not Met    Target Date  09/15/19      PT SHORT TERM GOAL #4   Title  Pt will ambulate household distances, 50-100 ft, no device with minimal to no R lateral hip sway, for improved return to independent mobility.    Baseline  minimal sway 09/11/2019; multiple bouts of 50-155ft in gym    Time  4    Period  Weeks    Status  Achieved    Target Date  09/15/19        PT Long Term Goals - 09/15/19 1100      PT LONG TERM GOAL #1   Title  Patient will be independent with final HEP with focus on strength and balance in order to build upon functional gains in therapy. ALL LTGs DUE 10/13/2019.    Time  8    Period  Weeks    Status  On-going      PT LONG TERM GOAL #2   Title  Pt will improve DGI score to at least 21/24 for decreased fall risk.    Baseline  DGI 13/24 06/27/2019; 16/24 (no cane) 08/18/2019    Time  8    Period  Weeks    Status  Revised      PT LONG TERM GOAL #3   Title  Pt will negotiate curb step and obstacle, no cane, independently, to demonstrate improved strength and balance for improved mobility.    Baseline  uses HHA for obstacle negoitation, curb with cane    Time  8    Period  Weeks    Status  New      PT LONG TERM GOAL #4   Title  Pt will improve 3 MWT with cane to at least 550 ft, for improved gait endurance and efficiency.    Baseline  493 (2nd trial) in 3MWT with SPC on 09/15/19 - felt weakness in R hip    Time  8    Period  Weeks    Status  Revised      PT LONG TERM GOAL #5   Title  Patient will ambulate at least 800'modified independently, over indoor level/outdoor unlevel surfaces using cane with step through gait pattern in order to improve community mobility.    Time  8    Period  Weeks    Status  On-going            Plan - 09/15/19 1210    Clinical Impression Statement  Focus of today's skilled session was checking remainder of pt's STGs. Pt  partially met STG #1 in regards to HEP - has not been consistently performing at home. Educated pt on continued importance of performing at home in order to build upon gains made in therapy, pt verbalized understanding. Pt did not meet STG #3 in regards to 3MWT, with SPC during 1st trial pt ambulated 430 ft and 2nd trial ambulated 493 ft (previous distance was 507 ft). Pt with Trendelenberg pattern with prolonged distances. Revised LTG as appropriate. Discussed POC going forward for 2x week for 4 weeks - pt would prefer to have 2 land therapy appointments vs. 1 aquatic and 1  land. Will continue to progress towards LTGs.    Personal Factors and Comorbidities  Past/Current Experience;Comorbidity 3+    Comorbidities  spinal cord CVA 01/2019, diabetes mellitus, HTN, PCOS, vitamin D deficiency, sp right (2011) and left (2017) hip replacement, peripheral edema, venous insufficiency.    Examination-Activity Limitations  Sit;Squat;Stairs;Stand;Transfers;Locomotion Level    Examination-Participation Restrictions  Meal Prep;Driving;Community Activity    Stability/Clinical Decision Making  Evolving/Moderate complexity    Rehab Potential  Good    PT Frequency  Other (comment)   3x/wk for 4 weeks, then 2x/wk for 4 weeks (per recert 93/73/4287   PT Duration  8 weeks    PT Treatment/Interventions  ADLs/Self Care Home Management;DME Instruction;Gait training;Stair training;Functional mobility training;Therapeutic activities;Therapeutic exercise;Balance training;Patient/family education;Neuromuscular re-education;Energy conservation;Aquatic Therapy;Orthotic Fit/Training    PT Next Visit Plan  obstacle and step negotiation.  Try foot propped at step with UE lifts/head turns/nods. stair training. endurance training with gait. continue R hip ABD/trunk strengthening.    PT Home Exercise Plan  [id number] - plus seated hamstring stretch and seated forward flexion low back stretch, supine hip flexor stretch    Consulted and Agree  with Plan of Care  Patient       Patient will benefit from skilled therapeutic intervention in order to improve the following deficits and impairments:  Abnormal gait, Decreased activity tolerance, Decreased balance, Decreased endurance, Decreased coordination, Decreased mobility, Decreased range of motion, Difficulty walking, Decreased strength, Impaired sensation  Visit Diagnosis: Other abnormalities of gait and mobility  Muscle weakness (generalized)  Unsteadiness on feet  Difficulty in walking, not elsewhere classified     Problem List Patient Active Problem List   Diagnosis Date Noted  . Dysesthesia 03/29/2019  . Gait disturbance 03/29/2019  . Spinal cord infarction ([place]) 02/20/2019  . Cauda equina syndrome ([place]) 02/13/2019  . DM2 (diabetes mellitus, type 2) ([place]) 02/13/2019  . Syncope 02/13/2019  . Essential hypertension 02/13/2019  . Class 3 severe obesity with serious comorbidity and body mass index (BMI) of 45.0 to 49.9 in adult ([place]) 10/04/2017  . Class 3 severe obesity without serious comorbidity with body mass index (BMI) of 45.0 to 49.9 in adult ([place]) 04/12/2017  . Vitamin D deficiency 04/12/2017  . Other fatigue 12/31/2016  . SOB (shortness of breath) on exertion 12/31/2016  . Type 2 diabetes mellitus without complication, without long-term current use of insulin ([place]) 12/31/2016  . Morbid obesity ([place]) 12/31/2016  . OA (osteoarthritis) of hip 12/02/2015    Arliss Journey, PT, DPT  09/15/2019, 12:19 PM  [facility] [address] [place], [place], [address] Phone: [phone]   Fax:  [phone]  Name: PANHIA KARL MRN: [id number] Date of Birth: 05/14/1956

## 2019-09-18 ENCOUNTER — Other Ambulatory Visit: Payer: Self-pay

## 2019-09-18 ENCOUNTER — Ambulatory Visit: Payer: 59 | Admitting: Rehabilitation

## 2019-09-18 ENCOUNTER — Encounter: Payer: Self-pay | Admitting: Rehabilitation

## 2019-09-18 DIAGNOSIS — R262 Difficulty in walking, not elsewhere classified: Secondary | ICD-10-CM

## 2019-09-18 DIAGNOSIS — M6281 Muscle weakness (generalized): Secondary | ICD-10-CM

## 2019-09-18 DIAGNOSIS — R2689 Other abnormalities of gait and mobility: Secondary | ICD-10-CM

## 2019-09-18 DIAGNOSIS — R2681 Unsteadiness on feet: Secondary | ICD-10-CM

## 2019-09-18 NOTE — Therapy (Signed)
[facility] [address] [place], [place], [address] Phone: [phone]   Fax:  [phone]  Physical Therapy Treatment  Patient Details  Name: Laura Blackwell MRN: [id number] Date of Birth: [birth year] Referring Provider (PT): Carol Ada, MD   Encounter Date: 09/18/2019  PT End of Session - 09/18/19 1111    Visit Number  44    Number of Visits  56   per recert 72/05/4708   Date for PT Re-Evaluation  11/16/19    Authorization Type  UHC:  60 VL PT/OT/ST    Authorization - Visit Number  65    Authorization - Number of Visits  60    PT Start Time  1013    PT Stop Time  1057    PT Time Calculation (min)  44 min    Equipment Utilized During Treatment  Gait belt    Activity Tolerance  Patient tolerated treatment well;No increased pain    Behavior During Therapy  WFL for tasks assessed/performed       Past Medical History:  Diagnosis Date  . Arthritis   . Diabetes mellitus without complication ([place])   . Edema    in left leg in 1980s on left ankle   . Hyperlipidemia   . Hypertension   . PCOS (polycystic ovarian syndrome)   . Superficial thrombophlebitis   . Swelling   . Venous insufficiency   . Vitamin D deficiency     Past Surgical History:  Procedure Laterality Date  . BREAST BIOPSY Right   . JOINT REPLACEMENT  2011    right hip   . left ankle surgery     . TOTAL HIP ARTHROPLASTY Left 12/02/2015   Procedure: LEFT TOTAL HIP ARTHROPLASTY ANTERIOR APPROACH;  Surgeon: [name], MD;  Location: [facility];  Service: Orthopedics;  Laterality: Left;    There were no vitals filed for this visit.  Subjective Assessment - 09/18/19 1016    Subjective  Reports increased tenderness at proximal lateral hip and medial distal thigh.  Feels that the sensitivity is increasing.    Pertinent History  spinal cord CVA 01/2019, diabetes mellitus, HTN, PCOS, vitamin D deficiency, sp right (2011) and left (2017) hip  replacement, peripheral edema, venous insufficiency.    How long can you walk comfortably?  only household distances, states could not walk around track in therapy gym with cane    Diagnostic tests  MRI showed an abnormal focus in the conus medullaris and lower spinal cord adjacent to T12-L1.    Patient Stated Goals  "wants to be 99.99% better, wants to be the best she can be    Currently in Pain?  No/denies         [facility] PT Assessment - 09/18/19 1025      Ambulation/Gait   Ambulation/Gait  Yes    Ambulation/Gait Assistance  6: Modified independent (Device/Increase time);5: Supervision;4: Min guard    Ambulation/Gait Assistance Details  Pt able to ambulate during 3MWT with SPC and without at mod I level.  Did work on gait without cane with PT providing light facilitation at trunk/rib cage to prevent lateral trunk flexion with cues for improved glute med contraction at R stance phase of gait.  Pt did well, but has difficulty maintaining.  Also provided cues for increased R step length.      Ambulation Distance (Feet)  1000 Feet    Assistive device  Straight cane;None    Gait Pattern  Step-through pattern;Decreased stride length;Decreased stance  time - left;Decreased step length - right;Decreased weight shift to left;Decreased trunk rotation;Narrow base of support;Lateral hip instability;Trendelenburg    Ambulation Surface  Level;Indoor    Stairs  Yes    Stairs Assistance  5: Supervision    Stairs Assistance Details (indicate cue type and reason)  Had pt perform stairs while carrying folded walker on one side and use of rail on opposite side.  Pt able to do so at S level.  Feel that she may be able to perform walking program more consistently to work on strength and endurance.  Pt verbalized understanding.     Stair Management Technique  One rail Left;Step to pattern;Forwards   carrying folded walker   Number of Stairs  4    Height of Stairs  6      6 Minute walk- Post Test   6 Minute Walk  Post Test  yes    HR (bpm)  136    02 Sat (%RA)  96 %    Modified Borg Scale for Dyspnea  7- Severe shortness of breath or very hard breathing    Perceived Rate of Exertion (Borg)  13- Somewhat hard      6 minute walk test results    Aerobic Endurance Distance Walked  565    Endurance additional comments  w/ SPC 3 MWT (didn't do official 3MWT as PT did not take vitals before, but pt wanting to see if she could increase distance).                    [facility] Adult PT Treatment/Exercise - 09/18/19 1025      Exercises   Exercises  Other Exercises    Other Exercises   With R foot placed on bottom 6" step facing mirror with BUE support performed R hip drop returning to pelvis level x 15 reps.  PT also demo'd how she could perform this is modified SLS with L foot placed in counter as at home.  Pt verbalized understanding.  Performed forward step ups with single (intermittent BUE support) 6" step x 12 reps, lateral step ups to 6" with BUE support (light as able) x 10 reps, L SL straight leg hip abd x 5 reps, then x 5 more reps with 3 sec hold (cues for foot and hip position).  Clam abduction as in HEP x 10 reps with cues for hip position.               PT Education - 09/18/19 1110    Education Details  Performing daily walking 3 mins at a time with use of RW in order to do so safely (she doesn't like to attempt without son with cane).    Person(s) Educated  Patient    Methods  Explanation    Comprehension  Verbalized understanding       PT Short Term Goals - 09/15/19 0949      PT SHORT TERM GOAL #1   Title  Patient will be independent with progression of HEP with focus on strength and balance in order to build upon functional gains in therapy. ALL STGs DUE 09/15/2019.    Time  4    Period  Weeks    Status  Partially Met    Target Date  09/15/19      PT SHORT TERM GOAL #2   Title  Patient will improve DGI to at least 19/24 for decreased fall risk.    Baseline  DGI 16/24  08/18/2019; DGI  18/24 09/11/2019    Time  4    Period  Weeks    Status  Partially Met    Target Date  09/15/19      PT SHORT TERM GOAL #3   Title  Pt will ambulate at least 550 ft in 3 minute walk test, with cane, for improved gait endurance and efficiency.    Baseline  507 ft in 3 minute walk 08/18/2019, 493 (2nd trial) in 3MWT with SPC on 09/15/19 - felt weakness in R hip    Time  4    Period  Weeks    Status  Not Met    Target Date  09/15/19      PT SHORT TERM GOAL #4   Title  Pt will ambulate household distances, 50-100 ft, no device with minimal to no R lateral hip sway, for improved return to independent mobility.    Baseline  minimal sway 09/11/2019; multiple bouts of 50-159ft in gym    Time  4    Period  Weeks    Status  Achieved    Target Date  09/15/19        PT Long Term Goals - 09/15/19 1100      PT LONG TERM GOAL #1   Title  Patient will be independent with final HEP with focus on strength and balance in order to build upon functional gains in therapy. ALL LTGs DUE 10/13/2019.    Time  8    Period  Weeks    Status  On-going      PT LONG TERM GOAL #2   Title  Pt will improve DGI score to at least 21/24 for decreased fall risk.    Baseline  DGI 13/24 06/27/2019; 16/24 (no cane) 08/18/2019    Time  8    Period  Weeks    Status  Revised      PT LONG TERM GOAL #3   Title  Pt will negotiate curb step and obstacle, no cane, independently, to demonstrate improved strength and balance for improved mobility.    Baseline  uses HHA for obstacle negoitation, curb with cane    Time  8    Period  Weeks    Status  New      PT LONG TERM GOAL #4   Title  Pt will improve 3 MWT with cane to at least 550 ft, for improved gait endurance and efficiency.    Baseline  493 (2nd trial) in 3MWT with SPC on 09/15/19 - felt weakness in R hip    Time  8    Period  Weeks    Status  Revised      PT LONG TERM GOAL #5   Title  Patient will ambulate at least 800'modified independently,  over indoor level/outdoor unlevel surfaces using cane with step through gait pattern in order to improve community mobility.    Time  8    Period  Weeks    Status  On-going            Plan - 09/18/19 1111    Clinical Impression Statement  Skilled session focused on improving endurance with gait (performed 3MWT upon pt request with approx 150' improvement since last session), problem solving going up/down outdoor steps while holding folded RW in order to perform walking program more consistently, and R hip strengthening.  Pt continues to have HR elevate to 140's (did get to 155 following step ups) but decreases quickly with seated or standing  rest break.    Personal Factors and Comorbidities  Past/Current Experience;Comorbidity 3+    Comorbidities  spinal cord CVA 01/2019, diabetes mellitus, HTN, PCOS, vitamin D deficiency, sp right (2011) and left (2017) hip replacement, peripheral edema, venous insufficiency.    Examination-Activity Limitations  Sit;Squat;Stairs;Stand;Transfers;Locomotion Level    Examination-Participation Restrictions  Meal Prep;Driving;Community Activity    Stability/Clinical Decision Making  Evolving/Moderate complexity    Rehab Potential  Good    PT Frequency  Other (comment)   3x/wk for 4 weeks, then 2x/wk for 4 weeks (per recert 33/54/5625   PT Duration  8 weeks    PT Treatment/Interventions  ADLs/Self Care Home Management;DME Instruction;Gait training;Stair training;Functional mobility training;Therapeutic activities;Therapeutic exercise;Balance training;Patient/family education;Neuromuscular re-education;Energy conservation;Aquatic Therapy;Orthotic Fit/Training    PT Next Visit Plan  Does her visit count need to start over?? obstacle and step negotiation.  Try foot propped at step with UE lifts/head turns/nods. stair training. endurance training with gait. continue R hip ABD/trunk strengthening.    PT Home Exercise Plan  [id number] - plus seated hamstring stretch and  seated forward flexion low back stretch, supine hip flexor stretch    Consulted and Agree with Plan of Care  Patient       Patient will benefit from skilled therapeutic intervention in order to improve the following deficits and impairments:  Abnormal gait, Decreased activity tolerance, Decreased balance, Decreased endurance, Decreased coordination, Decreased mobility, Decreased range of motion, Difficulty walking, Decreased strength, Impaired sensation  Visit Diagnosis: Other abnormalities of gait and mobility  Muscle weakness (generalized)  Unsteadiness on feet  Difficulty in walking, not elsewhere classified     Problem List Patient Active Problem List   Diagnosis Date Noted  . Dysesthesia 03/29/2019  . Gait disturbance 03/29/2019  . Spinal cord infarction ([place]) 02/20/2019  . Cauda equina syndrome ([place]) 02/13/2019  . DM2 (diabetes mellitus, type 2) ([place]) 02/13/2019  . Syncope 02/13/2019  . Essential hypertension 02/13/2019  . Class 3 severe obesity with serious comorbidity and body mass index (BMI) of 45.0 to 49.9 in adult ([place]) 10/04/2017  . Class 3 severe obesity without serious comorbidity with body mass index (BMI) of 45.0 to 49.9 in adult ([place]) 04/12/2017  . Vitamin D deficiency 04/12/2017  . Other fatigue 12/31/2016  . SOB (shortness of breath) on exertion 12/31/2016  . Type 2 diabetes mellitus without complication, without long-term current use of insulin ([place]) 12/31/2016  . Morbid obesity ([place]) 12/31/2016  . OA (osteoarthritis) of hip 12/02/2015    Cameron Sprang, PT, MPT [facility] [address] [place], [place], [address] Phone: [phone]   Fax:  [phone] 09/18/19, 11:15 AM  Name: Laura Blackwell MRN: [id number] Date of Birth: 1956/01/10

## 2019-09-20 ENCOUNTER — Ambulatory Visit: Payer: Self-pay | Admitting: Physical Therapy

## 2019-09-22 ENCOUNTER — Encounter: Payer: Self-pay | Admitting: Physical Therapy

## 2019-09-22 ENCOUNTER — Other Ambulatory Visit: Payer: Self-pay

## 2019-09-22 ENCOUNTER — Ambulatory Visit: Payer: 59 | Admitting: Physical Therapy

## 2019-09-22 DIAGNOSIS — R262 Difficulty in walking, not elsewhere classified: Secondary | ICD-10-CM | POA: Diagnosis not present

## 2019-09-22 DIAGNOSIS — R29818 Other symptoms and signs involving the nervous system: Secondary | ICD-10-CM

## 2019-09-22 DIAGNOSIS — M6281 Muscle weakness (generalized): Secondary | ICD-10-CM

## 2019-09-22 DIAGNOSIS — R2689 Other abnormalities of gait and mobility: Secondary | ICD-10-CM

## 2019-09-22 DIAGNOSIS — R2681 Unsteadiness on feet: Secondary | ICD-10-CM

## 2019-09-22 NOTE — Therapy (Signed)
[facility] [address] [place], [place], [address] Phone: [phone]   Fax:  [phone]  Physical Therapy Treatment  Patient Details  Name: Laura Blackwell MRN: [id number] Date of Birth: Feb 22, 1956 Referring Provider (PT): Carol Ada, MD   Encounter Date: 09/22/2019  PT End of Session - 09/22/19 0852    Visit Number  45    Number of Visits  56   per recert 70/96/2836   Date for PT Re-Evaluation  11/16/19    Authorization Type  UHC:  60 VL PT/OT/ST- follows caladar year    Authorization - Visit Number  6    Authorization - Number of Visits  60    PT Start Time  0848    PT Stop Time  0929    PT Time Calculation (min)  41 min    Equipment Utilized During Treatment  Gait belt    Activity Tolerance  Patient tolerated treatment well;No increased pain    Behavior During Therapy  WFL for tasks assessed/performed       Past Medical History:  Diagnosis Date  . Arthritis   . Diabetes mellitus without complication ([place])   . Edema    in left leg in 1980s on left ankle   . Hyperlipidemia   . Hypertension   . PCOS (polycystic ovarian syndrome)   . Superficial thrombophlebitis   . Swelling   . Venous insufficiency   . Vitamin D deficiency     Past Surgical History:  Procedure Laterality Date  . BREAST BIOPSY Right   . JOINT REPLACEMENT  2011    right hip   . left ankle surgery     . TOTAL HIP ARTHROPLASTY Left 12/02/2015   Procedure: LEFT TOTAL HIP ARTHROPLASTY ANTERIOR APPROACH;  Surgeon: [name], MD;  Location: [facility];  Service: Orthopedics;  Laterality: Left;    There were no vitals filed for this visit.  Subjective Assessment - 09/22/19 0850    Subjective  Reports the Gabapentin is helping her nerve pain, only taking 2 times a day vs the 3 it is prescribed for. No falls or pain. Does still have some sensitivity with the nerves in her both legs, right >left, and mostly in buttocks.    Pertinent  History  spinal cord CVA 01/2019, diabetes mellitus, HTN, PCOS, vitamin D deficiency, sp right (2011) and left (2017) hip replacement, peripheral edema, venous insufficiency.    How long can you walk comfortably?  only household distances, states could not walk around track in therapy gym with cane    Diagnostic tests  MRI showed an abnormal focus in the conus medullaris and lower spinal cord adjacent to T12-L1.    Patient Stated Goals  "wants to be 99.99% better, wants to be the best she can be    Currently in Pain?  No/denies    Pain Score  0-No pain           [facility] Adult PT Treatment/Exercise - 09/22/19 0853      Transfers   Transfers  Sit to Stand;Stand to Sit    Sit to Stand  6: Modified independent (Device/Increase time);With upper extremity assist;Without upper extremity assist;From chair/3-in-1;From bed    Stand to Sit  6: Modified independent (Device/Increase time);Without upper extremity assist;With upper extremity assist;To bed;To chair/3-in-1      Ambulation/Gait   Ambulation/Gait  Yes    Ambulation/Gait Assistance  6: Modified independent (Device/Increase time);5: Supervision    Ambulation/Gait Assistance Details  gait around  track with no AD working on gait speed/distance/preventing hip drop for 3 minutes. cues for arm swing as well. HR 138 bpm after gait.     Ambulation Distance (Feet)  480 Feet    Assistive device  Straight cane;None    Gait Pattern  Step-through pattern;Decreased stride length;Decreased stance time - left;Decreased step length - right;Decreased weight shift to left;Decreased trunk rotation;Narrow base of support;Lateral hip instability;Trendelenburg    Ambulation Surface  Level;Indoor      Exercises   Exercises  Other Exercises    Other Exercises   forward reciprocal step ups: floor<>BOSU<>8 inch box<>march with contralateral LE to stance LE for 10 reps each side. light UE support on bars for balance with cues for ex form/technique.            Balance Exercises - 09/22/19 0915      Balance Exercises: Standing   Sidestepping  Upper extremity support;3 reps;Theraband;Limitations    Theraband Level (Sidestepping)  Level 3 (Green)    Sidestepping Limitations  with green band around legs just above knees with bil light UE support on bars: in squat position side stepping x 3 laps each way with cues for posture/ex form, then in squat positon fwd/bwd lateral stepping (electric slide) for 3 laps each way. cues on form and technique.     Other Standing Exercises  on inverted BOSU: SLS on center with fwd/lateral/bwd kicks for 2 sets of 5 reps each way bil LE's. UE support on bars with cues on form and technique.           PT Short Term Goals - 09/15/19 0949      PT SHORT TERM GOAL #1   Title  Patient will be independent with progression of HEP with focus on strength and balance in order to build upon functional gains in therapy. ALL STGs DUE 09/15/2019.    Time  4    Period  Weeks    Status  Partially Met    Target Date  09/15/19      PT SHORT TERM GOAL #2   Title  Patient will improve DGI to at least 19/24 for decreased fall risk.    Baseline  DGI 16/24 08/18/2019; DGI 18/24 09/11/2019    Time  4    Period  Weeks    Status  Partially Met    Target Date  09/15/19      PT SHORT TERM GOAL #3   Title  Pt will ambulate at least 550 ft in 3 minute walk test, with cane, for improved gait endurance and efficiency.    Baseline  507 ft in 3 minute walk 08/18/2019, 493 (2nd trial) in 3MWT with SPC on 09/15/19 - felt weakness in R hip    Time  4    Period  Weeks    Status  Not Met    Target Date  09/15/19      PT SHORT TERM GOAL #4   Title  Pt will ambulate household distances, 50-100 ft, no device with minimal to no R lateral hip sway, for improved return to independent mobility.    Baseline  minimal sway 09/11/2019; multiple bouts of 50-133ft in gym    Time  4    Period  Weeks    Status  Achieved    Target Date  09/15/19         PT Long Term Goals - 09/15/19 1100      PT LONG TERM GOAL #1   Title  Patient will be independent with final HEP with focus on strength and balance in order to build upon functional gains in therapy. ALL LTGs DUE 10/13/2019.    Time  8    Period  Weeks    Status  On-going      PT LONG TERM GOAL #2   Title  Pt will improve DGI score to at least 21/24 for decreased fall risk.    Baseline  DGI 13/24 06/27/2019; 16/24 (no cane) 08/18/2019    Time  8    Period  Weeks    Status  Revised      PT LONG TERM GOAL #3   Title  Pt will negotiate curb step and obstacle, no cane, independently, to demonstrate improved strength and balance for improved mobility.    Baseline  uses HHA for obstacle negoitation, curb with cane    Time  8    Period  Weeks    Status  New      PT LONG TERM GOAL #4   Title  Pt will improve 3 MWT with cane to at least 550 ft, for improved gait endurance and efficiency.    Baseline  493 (2nd trial) in 3MWT with SPC on 09/15/19 - felt weakness in R hip    Time  8    Period  Weeks    Status  Revised      PT LONG TERM GOAL #5   Title  Patient will ambulate at least 800'modified independently, over indoor level/outdoor unlevel surfaces using cane with step through gait pattern in order to improve community mobility.    Time  8    Period  Weeks    Status  On-going         Plan - 09/22/19 7062    Clinical Impression Statement  Today's skilled session continued to focus on gait with no AD and LE strengthening. Rest breaks needed due to fatigue. HR max 140's with activity, decreased quickly with rest breaks. The pt is progressing toward goals and should benefit from continued PT to progress toward unmet goals.    Personal Factors and Comorbidities  Past/Current Experience;Comorbidity 3+    Comorbidities  spinal cord CVA 01/2019, diabetes mellitus, HTN, PCOS, vitamin D deficiency, sp right (2011) and left (2017) hip replacement, peripheral edema, venous  insufficiency.    Examination-Activity Limitations  Sit;Squat;Stairs;Stand;Transfers;Locomotion Level    Examination-Participation Restrictions  Meal Prep;Driving;Community Activity    Stability/Clinical Decision Making  Evolving/Moderate complexity    Rehab Potential  Good    PT Frequency  Other (comment)   3x/wk for 4 weeks, then 2x/wk for 4 weeks (per recert 37/62/8315   PT Duration  8 weeks    PT Treatment/Interventions  ADLs/Self Care Home Management;DME Instruction;Gait training;Stair training;Functional mobility training;Therapeutic activities;Therapeutic exercise;Balance training;Patient/family education;Neuromuscular re-education;Energy conservation;Aquatic Therapy;Orthotic Fit/Training    PT Next Visit Plan  obstacle and step negotiation.  Try foot propped at step with UE lifts/head turns/nods. stair training. endurance training with gait. continue R hip ABD/trunk strengthening.    PT Home Exercise Plan  [email or web address] - plus seated hamstring stretch and seated forward flexion low back stretch, supine hip flexor stretch    Consulted and Agree with Plan of Care  Patient       Patient will benefit from skilled therapeutic intervention in order to improve the following deficits and impairments:  Abnormal gait, Decreased activity tolerance, Decreased balance, Decreased endurance, Decreased coordination, Decreased mobility, Decreased range of motion, Difficulty walking, Decreased strength, Impaired sensation  Visit Diagnosis: Other abnormalities of gait and mobility  Muscle weakness (generalized)  Unsteadiness on feet  Difficulty in walking, not elsewhere classified  Other symptoms and signs involving the nervous system     Problem List Patient Active Problem List   Diagnosis Date Noted  . Dysesthesia 03/29/2019  . Gait disturbance 03/29/2019  . Spinal cord infarction ([place]) 02/20/2019  . Cauda equina syndrome ([place]) 02/13/2019  . DM2 (diabetes mellitus, type 2) ([place]) 02/13/2019   . Syncope 02/13/2019  . Essential hypertension 02/13/2019  . Class 3 severe obesity with serious comorbidity and body mass index (BMI) of 45.0 to 49.9 in adult ([place]) 10/04/2017  . Class 3 severe obesity without serious comorbidity with body mass index (BMI) of 45.0 to 49.9 in adult ([place]) 04/12/2017  . Vitamin D deficiency 04/12/2017  . Other fatigue 12/31/2016  . SOB (shortness of breath) on exertion 12/31/2016  . Type 2 diabetes mellitus without complication, without long-term current use of insulin ([place]) 12/31/2016  . Morbid obesity ([place]) 12/31/2016  . OA (osteoarthritis) of hip 12/02/2015    Willow Ora, PTA, [facility] [address] [place], [place] [address] [phone] 09/22/19, 10:44 PM   Name: Laura Blackwell MRN: [id number] Date of Birth: 25-Jul-1956

## 2019-09-25 ENCOUNTER — Other Ambulatory Visit: Payer: Self-pay

## 2019-09-25 ENCOUNTER — Ambulatory Visit: Payer: 59 | Admitting: Rehabilitation

## 2019-09-25 ENCOUNTER — Encounter: Payer: Self-pay | Admitting: Rehabilitation

## 2019-09-25 DIAGNOSIS — M6281 Muscle weakness (generalized): Secondary | ICD-10-CM

## 2019-09-25 DIAGNOSIS — R262 Difficulty in walking, not elsewhere classified: Secondary | ICD-10-CM | POA: Diagnosis not present

## 2019-09-25 DIAGNOSIS — R2689 Other abnormalities of gait and mobility: Secondary | ICD-10-CM

## 2019-09-25 DIAGNOSIS — R2681 Unsteadiness on feet: Secondary | ICD-10-CM

## 2019-09-25 NOTE — Patient Instructions (Signed)
Access Code: [email or web address]  URL: [email or web address]  Date: 09/25/2019  Prepared by: Cameron Sprang   Exercises Standing Hip Abduction with Counter Support - 5 reps - 3 sets - 2x daily - 7x weekly Standing Marching - 10 reps - 2 sets - 2x daily - 7x weekly Seated Ankle Plantar Flexion with Resistance Loop - 10 reps - 2-3 sets - 3 sec hold - 1x daily - 7x weekly Mini Squat - 10 reps - 2 sets - 1x daily - 7x weekly Clamshell - 10 reps - 2 sets - 2x daily - 7x weekly Supine Diaphragmatic Breathing - 10 reps - 3 sets - 1x daily - 7x weekly Backward Walking with Counter Support - 2 reps - 2x daily - 7x weekly Tandem Walking with Counter Support - 2 sets - 2x daily - 7x weekly Side Stepping with Resistance at Thighs - 2 sets - 2x daily - 7x weekly ITB Stretch at Wall - 3 reps - 1 sets - 30 sec hold - 1x daily - 5x weekly Single Leg Stance with Support - 3 reps - 1 sets - 10 sec hold - 2x daily - 7x weekly Step Taps on Low Step - 3 reps - 1 sets - 3 sec hold - 1-2x daily - 7x weekly Alternating Heel Raises - 10 reps - 2 sets - 1x daily - 5x weekly

## 2019-09-25 NOTE — Therapy (Signed)
[facility] [address] [place], [place], [address] Phone: [phone]   Fax:  [phone]  Physical Therapy Treatment  Patient Details  Name: Laura Blackwell MRN: [id number] Date of Birth: [birth year] Referring Provider (PT): Carol Ada, MD   Encounter Date: 09/25/2019  PT End of Session - 09/25/19 1118    Visit Number  46    Number of Visits  56   per recert 66/44/0347   Date for PT Re-Evaluation  11/16/19    Authorization Type  UHC:  60 VL PT/OT/ST- follows [place] year    Authorization - Visit Number  7    Authorization - Number of Visits  60    PT Start Time  [phone]    PT Stop Time  1015    PT Time Calculation (min)  39 min    Equipment Utilized During Treatment  Gait belt    Activity Tolerance  Patient tolerated treatment well;No increased pain    Behavior During Therapy  WFL for tasks assessed/performed       Past Medical History:  Diagnosis Date  . Arthritis   . Diabetes mellitus without complication ([place])   . Edema    in left leg in 1980s on left ankle   . Hyperlipidemia   . Hypertension   . PCOS (polycystic ovarian syndrome)   . Superficial thrombophlebitis   . Swelling   . Venous insufficiency   . Vitamin D deficiency     Past Surgical History:  Procedure Laterality Date  . BREAST BIOPSY Right   . JOINT REPLACEMENT  2011    right hip   . left ankle surgery     . TOTAL HIP ARTHROPLASTY Left 12/02/2015   Procedure: LEFT TOTAL HIP ARTHROPLASTY ANTERIOR APPROACH;  Surgeon: [name], MD;  Location: [facility];  Service: Orthopedics;  Laterality: Left;    There were no vitals filed for this visit.  Subjective Assessment - 09/25/19 0940    Subjective  Pt reports she was able to do her stairs without using walker (last step).  Didn't think about it but just did it.    Pertinent History  spinal cord CVA 01/2019, diabetes mellitus, HTN, PCOS, vitamin D deficiency, sp right (2011) and left  (2017) hip replacement, peripheral edema, venous insufficiency.    How long can you walk comfortably?  only household distances, states could not walk around track in therapy gym with cane    Diagnostic tests  MRI showed an abnormal focus in the conus medullaris and lower spinal cord adjacent to T12-L1.    Patient Stated Goals  "wants to be 99.99% better, wants to be the best she can be    Currently in Pain?  No/denies    Pain Score  0-No pain                       [facility] Adult PT Treatment/Exercise - 09/25/19 1020      Ambulation/Gait   Ambulation/Gait  Yes    Ambulation/Gait Assistance  6: Modified independent (Device/Increase time);5: Supervision    Ambulation/Gait Assistance Details  Pt feels that her walking is somewhat better today.  She demos during session without AD and did note that trunk lateral lean and trendelenbug is better today than previous sessions.      Ambulation Distance (Feet)  100 Feet    Assistive device  None    Gait Pattern  Step-through pattern;Decreased stride length;Decreased stance time - left;Decreased step length -  right;Decreased weight shift to left;Decreased trunk rotation;Narrow base of support;Lateral hip instability;Trendelenburg    Ambulation Surface  Level;Indoor    Stairs  Yes    Stairs Assistance  5: Supervision    Stairs Assistance Details (indicate cue type and reason)  Performed stairs as at home using clinics set of 2, 8" steps with use of L rail only.  Pt able to perform 14 stairs with min cues to descend with RLE in step to pattern when only having one rail as she tends to lack eccentric control.  Provided education for pt to have walker at top of stairs as she normallly does but to attempt to not use for at least a week and if she is able to consistently perform without walker, it can remain downstairs so that she may take it outside to perform walking program.  Pt verbalized understanding.     Stair Management Technique  One rail  Left;Alternating pattern;Step to pattern;Forwards    Number of Stairs  14    Height of Stairs  8      Self-Care   Self-Care  Other Self-Care Comments    Other Self-Care Comments   Pt reports she has still not ambulated outside with RW as she doesn't want to "go backwards" and relies on her "security blanket" of her son being there to S from [place].  Provided education that she is independent with use of RW, she keeps phone on her in case of emergency, however she then reports she is fearful of falling.  PT had pt perform floor recovery during session x 1 with use of mat, then x 1 with use of support on bottom rung of RW then top in which she was able to do at S to mod I level. Pt pleased that she is able to do and agrees to ambulate outside at least once per day to gain endurance.  Educated to increase time by 1 min after doing x 1 week if getting easier.  Pt verbalized understanding.       Neuro Re-ed    Neuro Re-ed Details   R hip strengthening in modified SLS with L foot propped (toes only) in cabinet tapping from cabinet to/from floor x 15 reps, then performing closed chain R hip abd (added to HEP) x 10 reps.               PT Education - 09/25/19 1118    Education Details  see gait and notes in HEP    Person(s) Educated  Patient    Methods  Explanation;Demonstration;Handout    Comprehension  Verbalized understanding;Returned demonstration       PT Short Term Goals - 09/15/19 0949      PT SHORT TERM GOAL #1   Title  Patient will be independent with progression of HEP with focus on strength and balance in order to build upon functional gains in therapy. ALL STGs DUE 09/15/2019.    Time  4    Period  Weeks    Status  Partially Met    Target Date  09/15/19      PT SHORT TERM GOAL #2   Title  Patient will improve DGI to at least 19/24 for decreased fall risk.    Baseline  DGI 16/24 08/18/2019; DGI 18/24 09/11/2019    Time  4    Period  Weeks    Status  Partially Met    Target  Date  09/15/19      PT SHORT TERM  GOAL #3   Title  Pt will ambulate at least 550 ft in 3 minute walk test, with cane, for improved gait endurance and efficiency.    Baseline  507 ft in 3 minute walk 08/18/2019, 493 (2nd trial) in 3MWT with SPC on 09/15/19 - felt weakness in R hip    Time  4    Period  Weeks    Status  Not Met    Target Date  09/15/19      PT SHORT TERM GOAL #4   Title  Pt will ambulate household distances, 50-100 ft, no device with minimal to no R lateral hip sway, for improved return to independent mobility.    Baseline  minimal sway 09/11/2019; multiple bouts of 50-151ft in gym    Time  4    Period  Weeks    Status  Achieved    Target Date  09/15/19        PT Long Term Goals - 09/15/19 1100      PT LONG TERM GOAL #1   Title  Patient will be independent with final HEP with focus on strength and balance in order to build upon functional gains in therapy. ALL LTGs DUE 10/13/2019.    Time  8    Period  Weeks    Status  On-going      PT LONG TERM GOAL #2   Title  Pt will improve DGI score to at least 21/24 for decreased fall risk.    Baseline  DGI 13/24 06/27/2019; 16/24 (no cane) 08/18/2019    Time  8    Period  Weeks    Status  Revised      PT LONG TERM GOAL #3   Title  Pt will negotiate curb step and obstacle, no cane, independently, to demonstrate improved strength and balance for improved mobility.    Baseline  uses HHA for obstacle negoitation, curb with cane    Time  8    Period  Weeks    Status  New      PT LONG TERM GOAL #4   Title  Pt will improve 3 MWT with cane to at least 550 ft, for improved gait endurance and efficiency.    Baseline  493 (2nd trial) in 3MWT with SPC on 09/15/19 - felt weakness in R hip    Time  8    Period  Weeks    Status  Revised      PT LONG TERM GOAL #5   Title  Patient will ambulate at least 800'modified independently, over indoor level/outdoor unlevel surfaces using cane with step through gait pattern in order to  improve community mobility.    Time  8    Period  Weeks    Status  On-going            Plan - 09/25/19 1120    Clinical Impression Statement  Skilled session focused on ensuring pt safe to perform floor/ground recovery in case of fall, continuing to recommend walking program outside with RW, attempting stairs without use of RW (still having it there in case) due to improved strength in RLE, and continued R hip strengthening.  Pt did very well during session.    Personal Factors and Comorbidities  Past/Current Experience;Comorbidity 3+    Comorbidities  spinal cord CVA 01/2019, diabetes mellitus, HTN, PCOS, vitamin D deficiency, sp right (2011) and left (2017) hip replacement, peripheral edema, venous insufficiency.    Examination-Activity Limitations  Sit;Squat;Stairs;Stand;Transfers;Locomotion Level  Examination-Participation Restrictions  Meal Prep;Driving;Community Activity    Stability/Clinical Decision Making  Evolving/Moderate complexity    Rehab Potential  Good    PT Frequency  Other (comment)   3x/wk for 4 weeks, then 2x/wk for 4 weeks (per recert 49/35/5217   PT Duration  8 weeks    PT Treatment/Interventions  ADLs/Self Care Home Management;DME Instruction;Gait training;Stair training;Functional mobility training;Therapeutic activities;Therapeutic exercise;Balance training;Patient/family education;Neuromuscular re-education;Energy conservation;Aquatic Therapy;Orthotic Fit/Training    PT Next Visit Plan  Make sure she is walking outside with RW, trying steps without walker!!  obstacle and step negotiation.  Try foot propped at step with UE lifts/head turns/nods. stair training. endurance training with gait. continue R hip ABD/trunk strengthening.    PT Home Exercise Plan  [email or web address] - plus seated hamstring stretch and seated forward flexion low back stretch, supine hip flexor stretch    Consulted and Agree with Plan of Care  Patient       Patient will benefit from skilled  therapeutic intervention in order to improve the following deficits and impairments:  Abnormal gait, Decreased activity tolerance, Decreased balance, Decreased endurance, Decreased coordination, Decreased mobility, Decreased range of motion, Difficulty walking, Decreased strength, Impaired sensation  Visit Diagnosis: Other abnormalities of gait and mobility  Muscle weakness (generalized)  Unsteadiness on feet  Difficulty in walking, not elsewhere classified     Problem List Patient Active Problem List   Diagnosis Date Noted  . Dysesthesia 03/29/2019  . Gait disturbance 03/29/2019  . Spinal cord infarction ([place]) 02/20/2019  . Cauda equina syndrome ([place]) 02/13/2019  . DM2 (diabetes mellitus, type 2) ([place]) 02/13/2019  . Syncope 02/13/2019  . Essential hypertension 02/13/2019  . Class 3 severe obesity with serious comorbidity and body mass index (BMI) of 45.0 to 49.9 in adult ([place]) 10/04/2017  . Class 3 severe obesity without serious comorbidity with body mass index (BMI) of 45.0 to 49.9 in adult ([place]) 04/12/2017  . Vitamin D deficiency 04/12/2017  . Other fatigue 12/31/2016  . SOB (shortness of breath) on exertion 12/31/2016  . Type 2 diabetes mellitus without complication, without long-term current use of insulin ([place]) 12/31/2016  . Morbid obesity ([place]) 12/31/2016  . OA (osteoarthritis) of hip 12/02/2015    Cameron Sprang, PT, MPT [facility] [address] [place], [place], [address] Phone: [phone]   Fax:  [phone] 09/25/19, 11:26 AM  Name: Laura Blackwell MRN: [id number] Date of Birth: Apr 11, 1956

## 2019-09-27 ENCOUNTER — Ambulatory Visit: Payer: Self-pay | Admitting: Physical Therapy

## 2019-09-29 ENCOUNTER — Encounter: Payer: Self-pay | Admitting: Physical Therapy

## 2019-09-29 ENCOUNTER — Other Ambulatory Visit: Payer: Self-pay

## 2019-09-29 ENCOUNTER — Ambulatory Visit: Payer: 59 | Admitting: Physical Therapy

## 2019-09-29 DIAGNOSIS — M6281 Muscle weakness (generalized): Secondary | ICD-10-CM

## 2019-09-29 DIAGNOSIS — R262 Difficulty in walking, not elsewhere classified: Secondary | ICD-10-CM | POA: Diagnosis not present

## 2019-09-29 DIAGNOSIS — R2681 Unsteadiness on feet: Secondary | ICD-10-CM

## 2019-09-29 DIAGNOSIS — R2689 Other abnormalities of gait and mobility: Secondary | ICD-10-CM

## 2019-09-29 NOTE — Therapy (Signed)
[facility] [address] [place], [place], [address] Phone: [phone]   Fax:  [phone]  Physical Therapy Treatment  Patient Details  Name: Laura Blackwell MRN: [id number] Date of Birth: 09/12/1955 Referring Provider (PT): Carol Ada, MD   Encounter Date: 09/29/2019  PT End of Session - 09/29/19 0936    Visit Number  47    Number of Visits  56   per recert 32/95/1884   Date for PT Re-Evaluation  11/16/19    Authorization Type  [employer]:  60 VL PT/OT/ST- follows caladar year    Authorization - Visit Number  8    Authorization - Number of Visits  60    PT Start Time  0933    PT Stop Time  1015    PT Time Calculation (min)  42 min    Equipment Utilized During Treatment  Gait belt    Activity Tolerance  Patient tolerated treatment well;No increased pain    Behavior During Therapy  WFL for tasks assessed/performed       Past Medical History:  Diagnosis Date  . Arthritis   . Diabetes mellitus without complication ([place])   . Edema    in left leg in 1980s on left ankle   . Hyperlipidemia   . Hypertension   . PCOS (polycystic ovarian syndrome)   . Superficial thrombophlebitis   . Swelling   . Venous insufficiency   . Vitamin D deficiency     Past Surgical History:  Procedure Laterality Date  . BREAST BIOPSY Right   . JOINT REPLACEMENT  2011    right hip   . left ankle surgery     . TOTAL HIP ARTHROPLASTY Left 12/02/2015   Procedure: LEFT TOTAL HIP ARTHROPLASTY ANTERIOR APPROACH;  Surgeon: [name], MD;  Location: [facility];  Service: Orthopedics;  Laterality: Left;    There were no vitals filed for this visit.  Subjective Assessment - 09/29/19 0934    Subjective  No new complaitns. Has been doing the stairs as instructed last session, leaving it next to the stairs in case she needs it, has used it a few times when she is tired. Did go outside walking with cane, not RW, as she was not able to lift it to  get it outside.    Pertinent History  spinal cord CVA 01/2019, diabetes mellitus, HTN, PCOS, vitamin D deficiency, sp right (2011) and left (2017) hip replacement, peripheral edema, venous insufficiency.    How long can you walk comfortably?  only household distances, states could not walk around track in therapy gym with cane    Diagnostic tests  MRI showed an abnormal focus in the conus medullaris and lower spinal cord adjacent to T12-L1.    Patient Stated Goals  "wants to be 99.99% better, wants to be the best she can be    Currently in Pain?  No/denies    Pain Score  0-No pain             [facility] Adult PT Treatment/Exercise - 09/29/19 0937      Transfers   Transfers  Sit to Stand;Stand to Sit    Sit to Stand  6: Modified independent (Device/Increase time);With upper extremity assist;Without upper extremity assist;From chair/3-in-1;From bed    Stand to Sit  6: Modified independent (Device/Increase time);Without upper extremity assist;With upper extremity assist;To bed;To chair/3-in-1      Ambulation/Gait   Ambulation/Gait  Yes    Ambulation/Gait Assistance  6: Modified independent (  Device/Increase time);5: Supervision    Ambulation/Gait Assistance Details  gait with cane around track/gym working on speed and distance for 3 minutes with no rest breaks. HR before 98, HR after 116. Moderately short of breath after 3 minutes of gait, seated rest needed.     Ambulation Distance (Feet)  442 Feet   x1   Assistive device  None    Ambulation Surface  Level;Indoor      Neuro Re-ed    Neuro Re-ed Details   for strengthening/muscle re-ed: tall kneeling on red mat on floor: mini squats for 10 reps with emphasis on midline, then mini squats with chest press using a 2# ball for 10 more reps; green band resisted mini squats for 2 sets of 10 reps with cues for full return to tall kneeling; then in tall kneeling with green band around legs- moving LE fwd/bwd for 10 reps each side, then moving LE out/in  for 10 reps each side. Cues on posture and weight shifting. Min guard to min assist for balance.     Exercises   Exercises  Other Exercises    Other Exercises   staggered stance on red mat: static mini lunges (lowering back knee toward mat) for 10 reps each side with HHA; seated with green band around knees, feet on airex- sit<>stansd for 2 sets of 10 reps with emphasis on tall standing and slow, controlled descent with sitting back down. Min guard to min assist with this activity.           PT Short Term Goals - 09/15/19 0949      PT SHORT TERM GOAL #1   Title  Patient will be independent with progression of HEP with focus on strength and balance in order to build upon functional gains in therapy. ALL STGs DUE 09/15/2019.    Time  4    Period  Weeks    Status  Partially Met    Target Date  09/15/19      PT SHORT TERM GOAL #2   Title  Patient will improve DGI to at least 19/24 for decreased fall risk.    Baseline  DGI 16/24 08/18/2019; DGI 18/24 09/11/2019    Time  4    Period  Weeks    Status  Partially Met    Target Date  09/15/19      PT SHORT TERM GOAL #3   Title  Pt will ambulate at least 550 ft in 3 minute walk test, with cane, for improved gait endurance and efficiency.    Baseline  507 ft in 3 minute walk 08/18/2019, 493 (2nd trial) in 3MWT with SPC on 09/15/19 - felt weakness in R hip    Time  4    Period  Weeks    Status  Not Met    Target Date  09/15/19      PT SHORT TERM GOAL #4   Title  Pt will ambulate household distances, 50-100 ft, no device with minimal to no R lateral hip sway, for improved return to independent mobility.    Baseline  minimal sway 09/11/2019; multiple bouts of 50-142ft in gym    Time  4    Period  Weeks    Status  Achieved    Target Date  09/15/19        PT Long Term Goals - 09/15/19 1100      PT LONG TERM GOAL #1   Title  Patient will be independent with final HEP with  focus on strength and balance in order to build upon functional  gains in therapy. ALL LTGs DUE 10/13/2019.    Time  8    Period  Weeks    Status  On-going      PT LONG TERM GOAL #2   Title  Pt will improve DGI score to at least 21/24 for decreased fall risk.    Baseline  DGI 13/24 06/27/2019; 16/24 (no cane) 08/18/2019    Time  8    Period  Weeks    Status  Revised      PT LONG TERM GOAL #3   Title  Pt will negotiate curb step and obstacle, no cane, independently, to demonstrate improved strength and balance for improved mobility.    Baseline  uses HHA for obstacle negoitation, curb with cane    Time  8    Period  Weeks    Status  New      PT LONG TERM GOAL #4   Title  Pt will improve 3 MWT with cane to at least 550 ft, for improved gait endurance and efficiency.    Baseline  493 (2nd trial) in 3MWT with SPC on 09/15/19 - felt weakness in R hip    Time  8    Period  Weeks    Status  Revised      PT LONG TERM GOAL #5   Title  Patient will ambulate at least 800'modified independently, over indoor level/outdoor unlevel surfaces using cane with step through gait pattern in order to improve community mobility.    Time  8    Period  Weeks    Status  On-going            Plan - 09/29/19 0936    Clinical Impression Statement  Today's skilled session continued to focus on activity tolerance, strengthening and balance reactions. Rest breaks taken to allow pt's HR to return to baseline after activity with max HR of 136 with activities. The pt is progressing toward goals and should benefit from continued PT to progress toward unmet goals.    Personal Factors and Comorbidities  Past/Current Experience;Comorbidity 3+    Comorbidities  spinal cord CVA 01/2019, diabetes mellitus, HTN, PCOS, vitamin D deficiency, sp right (2011) and left (2017) hip replacement, peripheral edema, venous insufficiency.    Examination-Activity Limitations  Sit;Squat;Stairs;Stand;Transfers;Locomotion Level    Examination-Participation Restrictions  Meal Prep;Driving;Community  Activity    Stability/Clinical Decision Making  Evolving/Moderate complexity    Rehab Potential  Good    PT Frequency  Other (comment)   3x/wk for 4 weeks, then 2x/wk for 4 weeks (per recert 17/61/6073   PT Duration  8 weeks    PT Treatment/Interventions  ADLs/Self Care Home Management;DME Instruction;Gait training;Stair training;Functional mobility training;Therapeutic activities;Therapeutic exercise;Balance training;Patient/family education;Neuromuscular re-education;Energy conservation;Aquatic Therapy;Orthotic Fit/Training    PT Next Visit Plan  obstacle and step negotiation.  Try foot propped at step with UE lifts/head turns/nods. stair training. endurance training with gait. continue R hip ABD/trunk strengthening.    PT Home Exercise Plan  [id number] - plus seated hamstring stretch and seated forward flexion low back stretch, supine hip flexor stretch    Consulted and Agree with Plan of Care  Patient       Patient will benefit from skilled therapeutic intervention in order to improve the following deficits and impairments:  Abnormal gait, Decreased activity tolerance, Decreased balance, Decreased endurance, Decreased coordination, Decreased mobility, Decreased range of motion, Difficulty walking, Decreased strength, Impaired sensation  Visit  Diagnosis: Other abnormalities of gait and mobility  Muscle weakness (generalized)  Unsteadiness on feet  Difficulty in walking, not elsewhere classified     Problem List Patient Active Problem List   Diagnosis Date Noted  . Dysesthesia 03/29/2019  . Gait disturbance 03/29/2019  . Spinal cord infarction ([place]) 02/20/2019  . Cauda equina syndrome ([place]) 02/13/2019  . DM2 (diabetes mellitus, type 2) ([place]) 02/13/2019  . Syncope 02/13/2019  . Essential hypertension 02/13/2019  . Class 3 severe obesity with serious comorbidity and body mass index (BMI) of 45.0 to 49.9 in adult ([place]) 10/04/2017  . Class 3 severe obesity without serious  comorbidity with body mass index (BMI) of 45.0 to 49.9 in adult ([place]) 04/12/2017  . Vitamin D deficiency 04/12/2017  . Other fatigue 12/31/2016  . SOB (shortness of breath) on exertion 12/31/2016  . Type 2 diabetes mellitus without complication, without long-term current use of insulin ([place]) 12/31/2016  . Morbid obesity ([place]) 12/31/2016  . OA (osteoarthritis) of hip 12/02/2015    Willow Ora, PTA, [facility] [address], [place], [place] [address] [phone] 09/29/19, 10:21 PM   Name: Laura Blackwell MRN: [id number] Date of Birth: 1956/01/27

## 2019-10-03 ENCOUNTER — Other Ambulatory Visit: Payer: Self-pay

## 2019-10-03 ENCOUNTER — Encounter: Payer: Self-pay | Admitting: Neurology

## 2019-10-03 ENCOUNTER — Ambulatory Visit: Payer: 59 | Admitting: Neurology

## 2019-10-03 VITALS — BP 170/82 | HR 108 | Temp 97.3°F | Ht 67.0 in | Wt 299.5 lb

## 2019-10-03 DIAGNOSIS — R208 Other disturbances of skin sensation: Secondary | ICD-10-CM

## 2019-10-03 DIAGNOSIS — R269 Unspecified abnormalities of gait and mobility: Secondary | ICD-10-CM | POA: Diagnosis not present

## 2019-10-03 DIAGNOSIS — G9511 Acute infarction of spinal cord (embolic) (nonembolic): Secondary | ICD-10-CM

## 2019-10-03 MED ORDER — HYOSCYAMINE SULFATE ER 0.375 MG PO TB12: 0.3750 mg | ORAL_TABLET | Freq: Two times a day (BID) | ORAL | 5 refills | Status: DC

## 2019-10-03 MED ORDER — GABAPENTIN 300 MG PO CAPS: ORAL_CAPSULE | ORAL | 11 refills | Status: DC

## 2019-10-03 NOTE — Progress Notes (Signed)
[facility]  PATIENT: Laura Blackwell DOB: Jun 11, 1956  REFERRING DOCTOR OR PCP: Carol Ada, MD SOURCE: Patient, Notes from Dr. Tamala Julian, imaging and lab reports, multiple MRIs of the spine and brain personally reviewed  _________________________________   HISTORICAL  CHIEF COMPLAINT:  Chief Complaint  Patient presents with  . Follow-up    RM 12, alone. Doing PT twice weekly. Has appt tomorrow. She ambulates with cane.     HISTORY OF PRESENT ILLNESS:  Laura Blackwell is a 64 year old woman with a spinal cord lesion.  Update 10/03/2019: Since her last visit about 6 months ago, she has noted some further improvement with her gait.  She is now able to walk without a cane.  At the time of her last visit she was still using a walker.  Balance is still poor.  She has burning dysesthesia in her perineum, buttocks, thighs and sometimes calves.   This intensified much more about a month ago.   Of note, this region was completely numb at the onset and still feels numb.   She is on gabapentin only 100 mg po bid and we discussed that low dose is unlikely to help pain.    She has little control over her bowel movements and has urinary urgency.   This leads to incontinece of both.     She has tried Detrol LA 4 which helped the bladder by half but not the bowel.   Myrbetriq was never filled due to price.    From initial consult 03/29/2019: I had the pleasure of seeing your patient, Laura Blackwell, at [facility] for neurologic consultation regarding her lower spinal cord lesion.  She is a 64 year old woman who had the onset of lower back pain and leg weakness 02/10/2019. She was at work, when she stood up she fell flat on her face.    She did not get better over the next 2 days and also had urinary incontinence and presented to the ED 02/13/19.  She was noted to have no sensation in the perineum and sacral area.  She had incontinence of bowel and bladder.  She had some left leg weakness.    She was admitted and MRI showed an abnormal focus in the conus medullaris and lower spinal cord adjacent to T12-L1.  This was felt to be a demyelinating lesion versus stroke and she received 5 days of high-dose IV steroids.  While in the hospital she had a lumbar puncture.  Oligoclonal bands were not present.  She notes improving since she received the steroids.   She is now able to walk some steps without a cane but uses a walker due to poor balance.   In PT she practiced using a cane.   She still has numbness and also feels a cold sensation in her legs/groin/buttocks.    She never had any numbness or weakness in the arms or chest/abdomen.   She notes some improvement in sensation but still has near total numbness in the groin/buttocks.   She now is starting to get a signal that she needs to use the bathroom.   She has not had incontinence the last few weeks.     She does not recall any infections in th presenting few weeks and had no vaccinations.    She has had DM x 7 years and has ben on insulin x 1 year.   She denies any numbness in her feet or eye or kidney issues related to DM   The  following MRIs and CTs were reviewed: 02/13/2019: MRI of the lumbar spine showed abnormal signal in the conus medullaris.  There is severe facet hypertrophy with 5 mm of anterolisthesis and moderately severe spinal stenosis at L4-L5.  Mild degenerative changes at the other levels. 02/14/2019: MRI of the thoracic (with and without) and lumbar spine (with contrast)  There is a lesion of the conus medullaris and distal thoracic spinal cord.  It does not enhance.   02/16/2019: MRI of the brain and cervical spine.  The brain was normal for age.  The spinal cord was normal.  There was some degenerative changes noted at the C5-C6 with left foraminal narrowing. 02/18/2019: CT angiogram of the head and neck were normal.  NMO-IgG Ab was negative.   HIV negative.   ESR mildly elevated at 32.  CSF  showed no oligoclonal bands.   CSF proteins and glucose mildly elevated.  7 WBC.   CSF VDRL negative.      REVIEW OF SYSTEMS: Constitutional: No fevers, chills, sweats, or change in appetite Eyes: No visual changes, double vision, eye pain Ear, nose and throat: No hearing loss, ear pain, nasal congestion, sore throat Cardiovascular: No chest pain, palpitations Respiratory: No shortness of breath at rest or with exertion.   No wheezes GastrointestinaI: No nausea, vomiting, diarrhea, abdominal pain, fecal incontinence Genitourinary: No dysuria, urinary retention or frequency.  No nocturia.  As above musculoskeletal: No neck pain, back pain Integumentary: No rash, pruritus, skin lesions Neurological: as above Psychiatric: No depression at this time.  No anxiety Endocrine: No palpitations, diaphoresis, change in appetite, change in weigh or increased thirst.  She has insulin-dependent type 2 diabetes. Hematologic/Lymphatic: No anemia, purpura, petechiae. Allergic/Immunologic: No itchy/runny eyes, nasal congestion, recent allergic reactions, rashes  ALLERGIES: No Known Allergies  HOME MEDICATIONS:  Current Outpatient Medications:  .  amLODipine (NORVASC) 5 MG tablet, Take 5 mg by mouth daily. , Disp: , Rfl:  .  aspirin EC 81 MG tablet, Take 81 mg by mouth daily., Disp: , Rfl:  .  atorvastatin (LIPITOR) 20 MG tablet, Take 20 mg by mouth daily. , Disp: , Rfl:  .  Cholecalciferol 125 MCG (5000 UT) capsule, Take 5,000 Units by mouth every Monday, Wednesday, and Friday., Disp: , Rfl:  .  Cyanocobalamin (VITAMIN B-12 PO), Take 1 capsule by mouth every Monday, Wednesday, and Friday., Disp: , Rfl:  .  furosemide (LASIX) 20 MG tablet, Take 20 mg by mouth 2 (two) times daily. , Disp: , Rfl:  .  glucose blood (ONETOUCH VERIO) test strip, 1 each by Other route 2 (two) times daily. Use as instructed, Disp: , Rfl:  .  Insulin Pen Needle (BD PEN NEEDLE NANO U/F) 32G X 4 MM MISC, 1 each by Does not  apply route daily., Disp: 100 each, Rfl: 0 .  losartan (COZAAR) 100 MG tablet, Take 100 mg by mouth every morning. , Disp: , Rfl:  .  metFORMIN (GLUCOPHAGE) 500 MG tablet, Take 1,000 mg by mouth 2 (two) times daily with a meal. , Disp: , Rfl:  .  SOLIQUA 100-33 UNT-MCG/ML SOPN, Inject 32 Units into the skin daily before breakfast. , Disp: , Rfl: 2 .  Thiamine HCl (VITAMIN B-1 PO), Take 1 capsule by mouth every Monday, Wednesday, and Friday., Disp: , Rfl:  .  VITAMIN A PO, Take 1 capsule by mouth every Monday, Wednesday, and Friday., Disp: , Rfl:  .  gabapentin (NEURONTIN) 300 MG capsule, One po qAm, one po q evening  and one po qHS, Disp: 90 capsule, Rfl: 11 .  glimepiride (AMARYL) 4 MG tablet, Take 1 tablet (4 mg total) by mouth 2 (two) times a day for 30 days., Disp: 60 tablet, Rfl: 0 .  hyoscyamine (LEVBID) 0.375 MG 12 hr tablet, Take 1 tablet (0.375 mg total) by mouth 2 (two) times daily., Disp: 60 tablet, Rfl: 5  PAST MEDICAL HISTORY: Past Medical History:  Diagnosis Date  . Arthritis   . Diabetes mellitus without complication ([place])   . Edema    in left leg in 1980s on left ankle   . Hyperlipidemia   . Hypertension   . PCOS (polycystic ovarian syndrome)   . Superficial thrombophlebitis   . Swelling   . Venous insufficiency   . Vitamin D deficiency     PAST SURGICAL HISTORY: Past Surgical History:  Procedure Laterality Date  . BREAST BIOPSY Right   . JOINT REPLACEMENT  2011    right hip   . left ankle surgery     . TOTAL HIP ARTHROPLASTY Left 12/02/2015   Procedure: LEFT TOTAL HIP ARTHROPLASTY ANTERIOR APPROACH;  Surgeon: [name], MD;  Location: [facility];  Service: Orthopedics;  Laterality: Left;    FAMILY HISTORY: Family History  Problem Relation Age of Onset  . Diabetes Mother   . Hypertension Mother     SOCIAL HISTORY:  Social History   Socioeconomic History  . Marital status: Single    Spouse name: Not on file  . Number of children: Not on file  . Years of  education: Not on file  . Highest education level: Not on file  Occupational History  . Occupation: Therapist, art  Tobacco Use  . Smoking status: Former Smoker    Quit date: 10/01/2002    Years since quitting: 17.0  . Smokeless tobacco: Never Used  Substance and Sexual Activity  . Alcohol use: No  . Drug use: No  . Sexual activity: Not on file  Other Topics Concern  . Not on file  Social History Narrative   Lives with son who is 9 (03/29/19)   Caffeine use: Hot tea daily   Right handed    Social Determinants of Health   Financial Resource Strain:   . Difficulty of Paying Living Expenses: Not on file  Food Insecurity:   . Worried About Charity fundraiser in the Last Year: Not on file  . Ran Out of Food in the Last Year: Not on file  Transportation Needs:   . Lack of Transportation (Medical): Not on file  . Lack of Transportation (Non-Medical): Not on file  Physical Activity:   . Days of Exercise per Week: Not on file  . Minutes of Exercise per Session: Not on file  Stress:   . Feeling of Stress : Not on file  Social Connections:   . Frequency of Communication with Friends and Family: Not on file  . Frequency of Social Gatherings with Friends and Family: Not on file  . Attends Religious Services: Not on file  . Active Member of Clubs or Organizations: Not on file  . Attends Archivist Meetings: Not on file  . Marital Status: Not on file  Intimate Partner Violence:   . Fear of Current or Ex-Partner: Not on file  . Emotionally Abused: Not on file  . Physically Abused: Not on file  . Sexually Abused: Not on file     PHYSICAL EXAM  Vitals:   10/03/19 1100  BP: (!) 170/82  Pulse: (!) 108  Temp: (!) 97.3 F (36.3 C)  Weight: 299 lb 8 oz (135.9 kg)  Height: 5' 7" (1.702 m)    Body mass index is 46.91 kg/m.   General: The patient is well-developed and well-nourished and in no acute distress  HEENT:  Head is [place]/AT.  Sclera are anicteric.     Neck: No carotid bruits are noted.  The neck is nontender.  Cardiovascular: The heart has a regular rate and rhythm with a normal S1 and S2. There were no murmurs, gallops or rubs.    Skin: Extremities are without rash or  edema.  Musculoskeletal:  Back is nontender  Neurologic Exam  Mental status: The patient is alert and oriented x 3 at the time of the examination. The patient has apparent normal recent and remote memory, with an apparently normal attention span and concentration ability.   Speech is normal.  Cranial nerves: Extraocular movements are full.  Color vision is symmetric.  Facial symmetry is present. There is good facial sensation to soft touch bilaterally.Facial strength is normal.  Trapezius and sternocleidomastoid strength is normal. No dysarthria is noted. No obvious hearing deficits are noted.  Motor:  Muscle bulk is normal.   Tone is normal. Strength is  5 / 5 in all 4 extremities.   Sensory: Sensory testing is intact to pinprick, soft touch and vibration sensation in the arms.   Mildly reduced touch and temperature and vibration  In legs but near absent touch/pp in perineal/sacral (S2 dermatome to S5)  Coordination: Cerebellar testing reveals good finger-nose-finger and heel-to-shin bilaterally.  Gait and station: Station is normal.   Gait is mildly wide with reduced stride but she walks without a cane.  . Romberg is negative.   Reflexes: Deep tendon reflexes are symmetric and normal bilaterally.   Plantar responses are flexor.    DIAGNOSTIC DATA (LABS, IMAGING, TESTING) - I reviewed patient records, labs, notes, testing and imaging myself where available.  Lab Results  Component Value Date   WBC 8.0 06/06/2019   HGB 12.9 06/06/2019   HCT 38.6 06/06/2019   MCV 81 06/06/2019   PLT 275 06/06/2019      Component Value Date/Time   NA 139 02/15/2019 0454   NA 141 05/04/2017 0818   K 3.7 02/15/2019 0454   CL 105 02/15/2019 0454   CO2 24 02/15/2019 0454    GLUCOSE 114 (H) 02/15/2019 0454   BUN 11 02/15/2019 0454   BUN 11 05/04/2017 0818   CREATININE 0.54 02/15/2019 0454   CALCIUM 9.5 02/15/2019 0454   PROT 6.2 (L) 02/14/2019 0809   PROT 6.9 05/04/2017 0818   ALBUMIN 3.1 (L) 02/14/2019 0809   ALBUMIN 4.1 05/04/2017 0818   AST 42 (H) 02/14/2019 0809   ALT 26 02/14/2019 0809   ALKPHOS 57 02/14/2019 0809   BILITOT 1.2 02/14/2019 0809   BILITOT 0.2 05/04/2017 0818   GFRNONAA >60 02/15/2019 0454   GFRAA >60 02/15/2019 0454   Lab Results  Component Value Date   CHOL 163 02/18/2019   HDL 60 02/18/2019   LDLCALC 81 02/18/2019   TRIG 110 02/18/2019   CHOLHDL 2.7 02/18/2019   Lab Results  Component Value Date   HGBA1C 7.5 (H) 02/14/2019   Lab Results  Component Value Date   [id number] 902 12/31/2016   Lab Results  Component Value Date   TSH 0.797 06/06/2019       ASSESSMENT AND PLAN    1. Spinal cord infarction ([place])  2. Dysesthesia   3. Gait disturbance     1.   She has a probable spinal cord infarct though a transverse myelitis is not ruled out.   We will check an MRI of the brain around time of next visit  to determine if any subclinical progression that would make MS a possibility.  2.   Increase gabapentin to 300 mg po tid and increase further if needed and well tolerated.    Consider lamotrigine if she can't tolerate therapeutic dose of gabapentin or if ineffective at higher dose.   3.   Hyoscyamine 0.375 bid to try to help both bowel and bladder  4.   RTC 4 months or sooner  40 minute spent on visit, mostly face to face interaction and also MRI review and documentation  Liyat Faulkenberry A. Felecia Shelling, MD, [facility] 09/04/7827, 56:21 AM Certified in Neurology, Clinical Neurophysiology, Sleep Medicine and Neuroimaging  [facility] [address] [place], [place] [address] [phone]

## 2019-10-04 ENCOUNTER — Ambulatory Visit: Payer: 59 | Attending: Family Medicine | Admitting: Physical Therapy

## 2019-10-04 ENCOUNTER — Encounter: Payer: Self-pay | Admitting: Physical Therapy

## 2019-10-04 ENCOUNTER — Other Ambulatory Visit: Payer: Self-pay

## 2019-10-04 DIAGNOSIS — R2689 Other abnormalities of gait and mobility: Secondary | ICD-10-CM

## 2019-10-04 DIAGNOSIS — M6281 Muscle weakness (generalized): Secondary | ICD-10-CM | POA: Diagnosis present

## 2019-10-04 DIAGNOSIS — R2681 Unsteadiness on feet: Secondary | ICD-10-CM | POA: Diagnosis present

## 2019-10-04 DIAGNOSIS — R262 Difficulty in walking, not elsewhere classified: Secondary | ICD-10-CM | POA: Diagnosis present

## 2019-10-04 NOTE — Therapy (Signed)
[facility] [address] [place], [place], [address] Phone: [phone]   Fax:  [phone]  Physical Therapy Treatment  Patient Details  Name: Laura Blackwell MRN: [id number] Date of Birth: 03-31-1956 Referring Provider (PT): Carol Ada, MD   Encounter Date: 10/04/2019  PT End of Session - 10/04/19 0853    Visit Number  48    Number of Visits  56   per recert 60/73/7106   Date for PT Re-Evaluation  11/16/19    Authorization Type  [employer]:  60 VL PT/OT/ST- follows caladar year    Authorization - Visit Number  9    Authorization - Number of Visits  60    PT Start Time  0848    PT Stop Time  0929    PT Time Calculation (min)  41 min    Equipment Utilized During Treatment  Gait belt    Activity Tolerance  Patient tolerated treatment well;No increased pain    Behavior During Therapy  WFL for tasks assessed/performed       Past Medical History:  Diagnosis Date  . Arthritis   . Diabetes mellitus without complication ([place])   . Edema    in left leg in 1980s on left ankle   . Hyperlipidemia   . Hypertension   . PCOS (polycystic ovarian syndrome)   . Superficial thrombophlebitis   . Swelling   . Venous insufficiency   . Vitamin D deficiency     Past Surgical History:  Procedure Laterality Date  . BREAST BIOPSY Right   . JOINT REPLACEMENT  2011    right hip   . left ankle surgery     . TOTAL HIP ARTHROPLASTY Left 12/02/2015   Procedure: LEFT TOTAL HIP ARTHROPLASTY ANTERIOR APPROACH;  Surgeon: [name], MD;  Location: [facility];  Service: Orthopedics;  Laterality: Left;    There were no vitals filed for this visit.  Subjective Assessment - 10/04/19 0850    Subjective  Saw Dr. Felecia Shelling yesterday who was impressed with her progress. He did increase her Gabapentin to 341m 3x a day and another new med to assist with bowel/bladder control. No falls.    Pertinent History  spinal cord CVA 01/2019, diabetes mellitus,  HTN, PCOS, vitamin D deficiency, sp right (2011) and left (2017) hip replacement, peripheral edema, venous insufficiency.    How long can you walk comfortably?  only household distances, states could not walk around track in therapy gym with cane    Diagnostic tests  MRI showed an abnormal focus in the conus medullaris and lower spinal cord adjacent to T12-L1.    Patient Stated Goals  "wants to be 99.99% better, wants to be the best she can be    Currently in Pain?  No/denies    Pain Score  0-No pain             [facility] Adult PT Treatment/Exercise - 10/04/19 02694     Neuro Re-ed    Neuro Re-ed Details   for strengthening/muscle re-ed: tall kneeling on mat table- mini squats for 10 reps in midline, then concurrent with chest press with 2# ball for 10 reps, then with green band resistance for 10 reps; side stepping with green band resistance left<>right x 2 laps each way with intermittent support on mat table, cues for posture. With green band around knees: alternating moving LE out/in for 10 reps each side, 2 sets done. Then alternating moving LE fwd/bwd for 10 reps each side,  2 sets done. cues for posture with support on mat and assist to lift/clear foot with movements.        Exercises   Exercises  Other Exercises    Other Exercises   steated with green band around knees with feet on red mat: sit<>stands for 2 sets of 10 reps with cues for full upright standing, slow controlled descent; single leg stance on center of inverted BOSU with bil UE support: contralateral LE kicks fwd/lateral/bwd for 2 sets of 5 reps each side, min guard assist for balance. .      Knee/Hip Exercises: Aerobic   Other Aerobic  Scifit UE/LE's on level 4.0 for 6 minutes with goal >/=60-70 rpm for strengthening of lower extremities.  HR  110 bpm following SciFit               PT Short Term Goals - 09/15/19 0949      PT SHORT TERM GOAL #1   Title  Patient will be independent with progression of HEP with  focus on strength and balance in order to build upon functional gains in therapy. ALL STGs DUE 09/15/2019.    Time  4    Period  Weeks    Status  Partially Met    Target Date  09/15/19      PT SHORT TERM GOAL #2   Title  Patient will improve DGI to at least 19/24 for decreased fall risk.    Baseline  DGI 16/24 08/18/2019; DGI 18/24 09/11/2019    Time  4    Period  Weeks    Status  Partially Met    Target Date  09/15/19      PT SHORT TERM GOAL #3   Title  Pt will ambulate at least 550 ft in 3 minute walk test, with cane, for improved gait endurance and efficiency.    Baseline  507 ft in 3 minute walk 08/18/2019, 493 (2nd trial) in 3MWT with SPC on 09/15/19 - felt weakness in R hip    Time  4    Period  Weeks    Status  Not Met    Target Date  09/15/19      PT SHORT TERM GOAL #4   Title  Pt will ambulate household distances, 50-100 ft, no device with minimal to no R lateral hip sway, for improved return to independent mobility.    Baseline  minimal sway 09/11/2019; multiple bouts of 50-141ft in gym    Time  4    Period  Weeks    Status  Achieved    Target Date  09/15/19        PT Long Term Goals - 09/15/19 1100      PT LONG TERM GOAL #1   Title  Patient will be independent with final HEP with focus on strength and balance in order to build upon functional gains in therapy. ALL LTGs DUE 10/13/2019.    Time  8    Period  Weeks    Status  On-going      PT LONG TERM GOAL #2   Title  Pt will improve DGI score to at least 21/24 for decreased fall risk.    Baseline  DGI 13/24 06/27/2019; 16/24 (no cane) 08/18/2019    Time  8    Period  Weeks    Status  Revised      PT LONG TERM GOAL #3   Title  Pt will negotiate curb step and obstacle, no cane,  independently, to demonstrate improved strength and balance for improved mobility.    Baseline  uses HHA for obstacle negoitation, curb with cane    Time  8    Period  Weeks    Status  New      PT LONG TERM GOAL #4   Title  Pt will  improve 3 MWT with cane to at least 550 ft, for improved gait endurance and efficiency.    Baseline  493 (2nd trial) in 3MWT with SPC on 09/15/19 - felt weakness in R hip    Time  8    Period  Weeks    Status  Revised      PT LONG TERM GOAL #5   Title  Patient will ambulate at least 800'modified independently, over indoor level/outdoor unlevel surfaces using cane with step through gait pattern in order to improve community mobility.    Time  8    Period  Weeks    Status  On-going            Plan - 10/04/19 3220    Clinical Impression Statement  Today's skilled session continued to focus on LE/core strengthening with rest breaks needed to allow HR to decrease after activity. HR max with ex's 123 bpm, resting rate in high 90's. The pt is progressing toward goals and should benefit from continued PT to progress toward unmet goals.    Personal Factors and Comorbidities  Past/Current Experience;Comorbidity 3+    Comorbidities  spinal cord CVA 01/2019, diabetes mellitus, HTN, PCOS, vitamin D deficiency, sp right (2011) and left (2017) hip replacement, peripheral edema, venous insufficiency.    Examination-Activity Limitations  Sit;Squat;Stairs;Stand;Transfers;Locomotion Level    Examination-Participation Restrictions  Meal Prep;Driving;Community Activity    Stability/Clinical Decision Making  Evolving/Moderate complexity    Rehab Potential  Good    PT Frequency  Other (comment)   3x/wk for 4 weeks, then 2x/wk for 4 weeks (per recert 25/42/7062   PT Duration  8 weeks    PT Treatment/Interventions  ADLs/Self Care Home Management;DME Instruction;Gait training;Stair training;Functional mobility training;Therapeutic activities;Therapeutic exercise;Balance training;Patient/family education;Neuromuscular re-education;Energy conservation;Aquatic Therapy;Orthotic Fit/Training    PT Next Visit Plan  obstacle and step negotiation.  Try foot propped at step with UE lifts/head turns/nods. stair training.  endurance training with gait. continue R hip ABD/trunk strengthening.    PT Home Exercise Plan  [id number] - plus seated hamstring stretch and seated forward flexion low back stretch, supine hip flexor stretch    Consulted and Agree with Plan of Care  Patient       Patient will benefit from skilled therapeutic intervention in order to improve the following deficits and impairments:  Abnormal gait, Decreased activity tolerance, Decreased balance, Decreased endurance, Decreased coordination, Decreased mobility, Decreased range of motion, Difficulty walking, Decreased strength, Impaired sensation  Visit Diagnosis: Other abnormalities of gait and mobility  Unsteadiness on feet  Muscle weakness (generalized)     Problem List Patient Active Problem List   Diagnosis Date Noted  . Dysesthesia 03/29/2019  . Gait disturbance 03/29/2019  . Spinal cord infarction ([place]) 02/20/2019  . Cauda equina syndrome ([place]) 02/13/2019  . DM2 (diabetes mellitus, type 2) ([place]) 02/13/2019  . Syncope 02/13/2019  . Essential hypertension 02/13/2019  . Class 3 severe obesity with serious comorbidity and body mass index (BMI) of 45.0 to 49.9 in adult ([place]) 10/04/2017  . Class 3 severe obesity without serious comorbidity with body mass index (BMI) of 45.0 to 49.9 in adult ([place]) 04/12/2017  . Vitamin  D deficiency 04/12/2017  . Other fatigue 12/31/2016  . SOB (shortness of breath) on exertion 12/31/2016  . Type 2 diabetes mellitus without complication, without long-term current use of insulin ([place]) 12/31/2016  . Morbid obesity ([place]) 12/31/2016  . OA (osteoarthritis) of hip 12/02/2015    Willow Ora, PTA, [facility] [address] [place], [place] [address] [phone] 10/04/19, 1:19 PM   Name: Laura Blackwell MRN: [id number] Date of Birth: Jan 03, 1956

## 2019-10-06 ENCOUNTER — Ambulatory Visit: Payer: 59 | Admitting: Physical Therapy

## 2019-10-06 ENCOUNTER — Encounter: Payer: Self-pay | Admitting: Physical Therapy

## 2019-10-06 ENCOUNTER — Other Ambulatory Visit: Payer: Self-pay

## 2019-10-06 DIAGNOSIS — R2689 Other abnormalities of gait and mobility: Secondary | ICD-10-CM

## 2019-10-06 DIAGNOSIS — M6281 Muscle weakness (generalized): Secondary | ICD-10-CM

## 2019-10-06 DIAGNOSIS — R2681 Unsteadiness on feet: Secondary | ICD-10-CM

## 2019-10-06 NOTE — Therapy (Signed)
[facility] [address] [place], [place], [address] Phone: [phone]   Fax:  [phone]  Physical Therapy Treatment  Patient Details  Name: Laura Blackwell MRN: [id number] Date of Birth: 1956/07/16 Referring Provider (PT): Carol Ada, MD   Encounter Date: 10/06/2019  PT End of Session - 10/06/19 0850    Visit Number  49    Number of Visits  56   per recert 62/83/6629   Date for PT Re-Evaluation  11/16/19    Authorization Type  [employer]:  60 VL PT/OT/ST- follows caladar year    Authorization - Visit Number  10    Authorization - Number of Visits  60    PT Start Time  4765    PT Stop Time  0929    PT Time Calculation (min)  42 min    Equipment Utilized During Treatment  Gait belt    Activity Tolerance  Patient tolerated treatment well;No increased pain    Behavior During Therapy  WFL for tasks assessed/performed       Past Medical History:  Diagnosis Date  . Arthritis   . Diabetes mellitus without complication ([place])   . Edema    in left leg in 1980s on left ankle   . Hyperlipidemia   . Hypertension   . PCOS (polycystic ovarian syndrome)   . Superficial thrombophlebitis   . Swelling   . Venous insufficiency   . Vitamin D deficiency     Past Surgical History:  Procedure Laterality Date  . BREAST BIOPSY Right   . JOINT REPLACEMENT  2011    right hip   . left ankle surgery     . TOTAL HIP ARTHROPLASTY Left 12/02/2015   Procedure: LEFT TOTAL HIP ARTHROPLASTY ANTERIOR APPROACH;  Surgeon: [name], MD;  Location: [facility];  Service: Orthopedics;  Laterality: Left;    There were no vitals filed for this visit.  Subjective Assessment - 10/06/19 0849    Subjective  No new complaints. Gabapentin is helping her pain. Was sore after last session, not as much as session before.    Pertinent History  spinal cord CVA 01/2019, diabetes mellitus, HTN, PCOS, vitamin D deficiency, sp right (2011) and left (2017)  hip replacement, peripheral edema, venous insufficiency.    How long can you walk comfortably?  only household distances, states could not walk around track in therapy gym with cane    Diagnostic tests  MRI showed an abnormal focus in the conus medullaris and lower spinal cord adjacent to T12-L1.    Patient Stated Goals  "wants to be 99.99% better, wants to be the best she can be    Currently in Pain?  No/denies    Pain Score  0-No pain           [facility] Adult PT Treatment/Exercise - 10/06/19 4650      Ambulation/Gait   Curb  5: Supervision;Other (comment)   Min guard assist   Curb Details (indicate cue type and reason)  block practice with aerobic step no device with supervision. then with 6 inch curb for 5 reps with min guard assist. cues for sequencing needed and step length with descending large curb to prevent heel from catching.       Exercises   Exercises  Other Exercises    Other Exercises   standing with green band around LE's above knees: in squat position side steppping for 3 laps each way with UE support on counter, min guard assist.  HR 127 bpm afterwards; then fwd/bwd lateral stepping in squat position (electric slide) for 3 laps with min guard assist, no UE support, cues to stay in bent position;       Knee/Hip Exercises: Aerobic   Other Aerobic  HR 93 before Scifit: Level 4.0 for 6 minutes with UE/LE's with goal >/= 70 rpm for strengthening and activity tolerance.  HR 112 afterwards.          Balance Exercises - 10/06/19 0924      Balance Exercises: Standing   Standing Eyes Closed  Wide (BOA);Foam/compliant surface;3 reps;30 secs;Limitations    Standing Eyes Closed Limitations  on airex with feet hip width apart no UE support: feet hip width apart for EC no head movements. Min guard to min assist for balance.    Partial Tandem Stance  Eyes closed;Foam/compliant surface;Intermittent upper extremity support;3 reps;30 secs;Limitations    Partial Tandem Stance  Limitations  one foot forward on airex/back foot on floor: 3 reps each foot forward with occasional UE assist needed for balance with right foot forward only. Min guard to min assist for balance.          PT Short Term Goals - 09/15/19 0949      PT SHORT TERM GOAL #1   Title  Patient will be independent with progression of HEP with focus on strength and balance in order to build upon functional gains in therapy. ALL STGs DUE 09/15/2019.    Time  4    Period  Weeks    Status  Partially Met    Target Date  09/15/19      PT SHORT TERM GOAL #2   Title  Patient will improve DGI to at least 19/24 for decreased fall risk.    Baseline  DGI 16/24 08/18/2019; DGI 18/24 09/11/2019    Time  4    Period  Weeks    Status  Partially Met    Target Date  09/15/19      PT SHORT TERM GOAL #3   Title  Pt will ambulate at least 550 ft in 3 minute walk test, with cane, for improved gait endurance and efficiency.    Baseline  507 ft in 3 minute walk 08/18/2019, 493 (2nd trial) in 3MWT with SPC on 09/15/19 - felt weakness in R hip    Time  4    Period  Weeks    Status  Not Met    Target Date  09/15/19      PT SHORT TERM GOAL #4   Title  Pt will ambulate household distances, 50-100 ft, no device with minimal to no R lateral hip sway, for improved return to independent mobility.    Baseline  minimal sway 09/11/2019; multiple bouts of 50-195ft in gym    Time  4    Period  Weeks    Status  Achieved    Target Date  09/15/19        PT Long Term Goals - 09/15/19 1100      PT LONG TERM GOAL #1   Title  Patient will be independent with final HEP with focus on strength and balance in order to build upon functional gains in therapy. ALL LTGs DUE 10/13/2019.    Time  8    Period  Weeks    Status  On-going      PT LONG TERM GOAL #2   Title  Pt will improve DGI score to at least 21/24 for decreased fall risk.  Baseline  DGI 13/24 06/27/2019; 16/24 (no cane) 08/18/2019    Time  8    Period  Weeks     Status  Revised      PT LONG TERM GOAL #3   Title  Pt will negotiate curb step and obstacle, no cane, independently, to demonstrate improved strength and balance for improved mobility.    Baseline  uses HHA for obstacle negoitation, curb with cane    Time  8    Period  Weeks    Status  New      PT LONG TERM GOAL #4   Title  Pt will improve 3 MWT with cane to at least 550 ft, for improved gait endurance and efficiency.    Baseline  493 (2nd trial) in 3MWT with SPC on 09/15/19 - felt weakness in R hip    Time  8    Period  Weeks    Status  Revised      PT LONG TERM GOAL #5   Title  Patient will ambulate at least 800'modified independently, over indoor level/outdoor unlevel surfaces using cane with step through gait pattern in order to improve community mobility.    Time  8    Period  Weeks    Status  On-going            Plan - 10/06/19 0851    Clinical Impression Statement  Today's skilled session continued to focus on strengthening and curb negotiaion without a device. Rest breaks taken due to fatigue and to allow HR to decrease after activity (HR max 125). The pt is progressing toward goals and should benefit from continued PT to progress toward unmet goals.    Personal Factors and Comorbidities  Past/Current Experience;Comorbidity 3+    Comorbidities  spinal cord CVA 01/2019, diabetes mellitus, HTN, PCOS, vitamin D deficiency, sp right (2011) and left (2017) hip replacement, peripheral edema, venous insufficiency.    Examination-Activity Limitations  Sit;Squat;Stairs;Stand;Transfers;Locomotion Level    Examination-Participation Restrictions  Meal Prep;Driving;Community Activity    Stability/Clinical Decision Making  Evolving/Moderate complexity    Rehab Potential  Good    PT Frequency  Other (comment)   3x/wk for 4 weeks, then 2x/wk for 4 weeks (per recert 37/16/9678   PT Duration  8 weeks    PT Treatment/Interventions  ADLs/Self Care Home Management;DME Instruction;Gait  training;Stair training;Functional mobility training;Therapeutic activities;Therapeutic exercise;Balance training;Patient/family education;Neuromuscular re-education;Energy conservation;Aquatic Therapy;Orthotic Fit/Training    PT Next Visit Plan  continue to work on curb negotiation without device, balance reactions on compliant surfaces and LE strengthening with emphasis on right hip strengthening.    PT Home Exercise Plan  [id number] - plus seated hamstring stretch and seated forward flexion low back stretch, supine hip flexor stretch    Consulted and Agree with Plan of Care  Patient       Patient will benefit from skilled therapeutic intervention in order to improve the following deficits and impairments:  Abnormal gait, Decreased activity tolerance, Decreased balance, Decreased endurance, Decreased coordination, Decreased mobility, Decreased range of motion, Difficulty walking, Decreased strength, Impaired sensation  Visit Diagnosis: Other abnormalities of gait and mobility  Unsteadiness on feet  Muscle weakness (generalized)     Problem List Patient Active Problem List   Diagnosis Date Noted  . Dysesthesia 03/29/2019  . Gait disturbance 03/29/2019  . Spinal cord infarction ([place]) 02/20/2019  . Cauda equina syndrome ([place]) 02/13/2019  . DM2 (diabetes mellitus, type 2) ([place]) 02/13/2019  . Syncope 02/13/2019  . Essential hypertension 02/13/2019  .  Class 3 severe obesity with serious comorbidity and body mass index (BMI) of 45.0 to 49.9 in adult ([place]) 10/04/2017  . Class 3 severe obesity without serious comorbidity with body mass index (BMI) of 45.0 to 49.9 in adult ([place]) 04/12/2017  . Vitamin D deficiency 04/12/2017  . Other fatigue 12/31/2016  . SOB (shortness of breath) on exertion 12/31/2016  . Type 2 diabetes mellitus without complication, without long-term current use of insulin ([place]) 12/31/2016  . Morbid obesity ([place]) 12/31/2016  . OA (osteoarthritis) of hip 12/02/2015     Willow Ora, PTA, [facility] [address] [place], [place] [address] [phone] 10/06/19, 9:40 PM   Name: Laura Blackwell MRN: [id number] Date of Birth: [birth year]

## 2019-10-10 ENCOUNTER — Other Ambulatory Visit: Payer: Self-pay

## 2019-10-10 ENCOUNTER — Ambulatory Visit: Payer: 59 | Admitting: Physical Therapy

## 2019-10-10 ENCOUNTER — Encounter: Payer: Self-pay | Admitting: Physical Therapy

## 2019-10-10 DIAGNOSIS — R2689 Other abnormalities of gait and mobility: Secondary | ICD-10-CM | POA: Diagnosis not present

## 2019-10-10 DIAGNOSIS — M6281 Muscle weakness (generalized): Secondary | ICD-10-CM

## 2019-10-10 DIAGNOSIS — R2681 Unsteadiness on feet: Secondary | ICD-10-CM

## 2019-10-11 NOTE — Therapy (Signed)
[facility] [address] [place], [place], [address] Phone: [phone]   Fax:  [phone]  Physical Therapy Treatment  Patient Details  Name: Laura Blackwell MRN: [id number] Date of Birth: 21-Mar-1956 Referring Provider (PT): Carol Ada, MD   Encounter Date: 10/10/2019     10/10/19 0942  PT Visits / Re-Eval  Visit Number 50  Number of Visits 56 (per recert 89/38/1017)  Date for PT Re-Evaluation 11/16/19  Authorization  Authorization Type UHC:  60 VL PT/OT/ST- follows caladar year  Authorization - Visit Number 11  Authorization - Number of Visits 60  PT Time Calculation  PT Start Time 0932  PT Stop Time 1012  PT Time Calculation (min) 40 min  PT - End of Session  Equipment Utilized During Treatment Gait belt  Activity Tolerance Patient tolerated treatment well;No increased pain  Behavior During Therapy WFL for tasks assessed/performed    Past Medical History:  Diagnosis Date  . Arthritis   . Diabetes mellitus without complication ([place])   . Edema    in left leg in 1980s on left ankle   . Hyperlipidemia   . Hypertension   . PCOS (polycystic ovarian syndrome)   . Superficial thrombophlebitis   . Swelling   . Venous insufficiency   . Vitamin D deficiency     Past Surgical History:  Procedure Laterality Date  . BREAST BIOPSY Right   . JOINT REPLACEMENT  2011    right hip   . left ankle surgery     . TOTAL HIP ARTHROPLASTY Left 12/02/2015   Procedure: LEFT TOTAL HIP ARTHROPLASTY ANTERIOR APPROACH;  Surgeon: [name], MD;  Location: [facility];  Service: Orthopedics;  Laterality: Left;    There were no vitals filed for this visit.     10/10/19 0936  Symptoms/Limitations  Subjective Having some right foot pain today. Started yesterday and feels it all the way up her leg when she steps on it. No falls.  Pertinent History spinal cord CVA 01/2019, diabetes mellitus, HTN, PCOS, vitamin D deficiency,  sp right (2011) and left (2017) hip replacement, peripheral edema, venous insufficiency.  How long can you walk comfortably? only household distances, states could not walk around track in therapy gym with cane  Diagnostic tests MRI showed an abnormal focus in the conus medullaris and lower spinal cord adjacent to T12-L1.  Patient Stated Goals "wants to be 99.99% better, wants to be the best she can be  Pain Assessment  Currently in Pain? Yes  Pain Score 7  Pain Location Foot  Pain Orientation Right  Pain Descriptors / Indicators Aching;Tender;Sore  Pain Type Acute pain;Chronic pain  Pain Radiating Towards up the right leg  Pain Onset Yesterday  Pain Frequency Intermittent  Aggravating Factors  unsure  Pain Relieving Factors unknown at this time      10/10/19 0944  Ambulation/Gait  Ambulation/Gait Yes  Ambulation/Gait Assistance 6: Modified independent (Device/Increase time);4: Min guard  Ambulation/Gait Assistance Details 585 feet in 3 minutes with straight cane. HR 125 bpm afterwards; gait outside on uneven surfaces (paved only due to wetness of compliant surfaces) with min guard assist. increased trendelenburg gait pattern noted as pt fatigued.   Ambulation Distance (Feet) 585 Feet (x1, 500 x1 in/outdoor combined)  Assistive device Straight cane  Gait Pattern Step-through pattern;Decreased stride length;Decreased stance time - left;Decreased step length - right;Decreased weight shift to left;Decreased trunk rotation;Narrow base of support;Lateral hip instability;Trendelenburg  Ambulation Surface Level;Indoor;Unlevel;Outdoor;Paved  Exercises  Exercises Other Exercises  Other Exercises  rolling pin under foot for plantar fascia stretch for ~20 reps with cues to slow down.   Manual Therapy  Manual therapy comments ice massage to right plantar fascia for 5 minutes. Pt educated on how to do this at home for pain control    Self care: Along with primary PT discussed progress to date  and plan to recert for 4 more weeks. Discussed that during  this time the focus would be on establishing/transitioning to a community fitness program, continued focus on  gait with no AD with emphasis on curbs. Also will continue to address LE strengthening. The pt was in agree- ment with this plan.    PT Short Term Goals - 09/15/19 0949      PT SHORT TERM GOAL #1   Title  Patient will be independent with progression of HEP with focus on strength and balance in order to build upon functional gains in therapy. ALL STGs DUE 09/15/2019.    Time  4    Period  Weeks    Status  Partially Met    Target Date  09/15/19      PT SHORT TERM GOAL #2   Title  Patient will improve DGI to at least 19/24 for decreased fall risk.    Baseline  DGI 16/24 08/18/2019; DGI 18/24 09/11/2019    Time  4    Period  Weeks    Status  Partially Met    Target Date  09/15/19      PT SHORT TERM GOAL #3   Title  Pt will ambulate at least 550 ft in 3 minute walk test, with cane, for improved gait endurance and efficiency.    Baseline  507 ft in 3 minute walk 08/18/2019, 493 (2nd trial) in 3MWT with SPC on 09/15/19 - felt weakness in R hip    Time  4    Period  Weeks    Status  Not Met    Target Date  09/15/19      PT SHORT TERM GOAL #4   Title  Pt will ambulate household distances, 50-100 ft, no device with minimal to no R lateral hip sway, for improved return to independent mobility.    Baseline  minimal sway 09/11/2019; multiple bouts of 50-144ft in gym    Time  4    Period  Weeks    Status  Achieved    Target Date  09/15/19        PT Long Term Goals - 10/10/19 1952      PT LONG TERM GOAL #1   Title  Patient will be independent with final HEP with focus on strength and balance in order to build upon functional gains in therapy. ALL LTGs DUE 10/13/2019.    Baseline  10/10/19: met with current program, will need to be updated as pt progresses and need to work toward community fitness plan    Status  Achieved       PT LONG TERM GOAL #2   Title  Pt will improve DGI score to at least 21/24 for decreased fall risk.    Baseline  DGI 13/24 06/27/2019; 16/24 (no cane) 08/18/2019    Time  8    Period  Weeks    Status  On-going      PT LONG TERM GOAL #3   Title  Pt will negotiate curb step and obstacle, no cane, independently, to demonstrate improved strength and balance for improved mobility.    Baseline  uses HHA for obstacle negoitation, curb with cane    Time  8    Period  Weeks    Status  On-going      PT LONG TERM GOAL #4   Title  Pt will improve 3 MWT with cane to at least 550 ft, for improved gait endurance and efficiency.    Baseline  10/10/19: 585 feet with cane    Status  Achieved      PT LONG TERM GOAL #5   Title  Patient will ambulate at least 800'modified independently, over indoor level/outdoor unlevel surfaces using cane with step through gait pattern in order to improve community mobility.    Baseline  10/10/19: pt able to go 500 feet with min guard assist with cane    Status  Not Met           10/10/19 0943  Plan  Clinical Impression Statement Today's skilled session began to address LTGs after addressing foot pain in anticipation for recert for 4 more weeks to continue to work on curb negotiation with no AD, LE strengthening and progress toward community fitness program. Will check remaining goals at next session and send to primary PT for recert.  Personal Factors and Comorbidities Past/Current Experience;Comorbidity 3+  Comorbidities spinal cord CVA 01/2019, diabetes mellitus, HTN, PCOS, vitamin D deficiency, sp right (2011) and left (2017) hip replacement, peripheral edema, venous insufficiency.  Examination-Activity Limitations Sit;Squat;Stairs;Stand;Transfers;Locomotion Level  Examination-Participation Restrictions Meal Prep;Driving;Community Activity  Pt will benefit from skilled therapeutic intervention in order to improve on the following deficits Abnormal gait;Decreased  activity tolerance;Decreased balance;Decreased endurance;Decreased coordination;Decreased mobility;Decreased range of motion;Difficulty walking;Decreased strength;Impaired sensation  Stability/Clinical Decision Making Evolving/Moderate complexity  Rehab Potential Good  PT Frequency Other (comment) (3x/wk for 4 weeks, then 2x/wk for 4 weeks (per recert 86/57/8469)  PT Duration 8 weeks  PT Treatment/Interventions ADLs/Self Care Home Management;DME Instruction;Gait training;Stair training;Functional mobility training;Therapeutic activities;Therapeutic exercise;Balance training;Patient/family education;Neuromuscular re-education;Energy conservation;Aquatic Therapy;Orthotic Fit/Training  PT Next Visit Plan check remaining goals and [facility] to [facility] for recert; continue to work on curb negotiation without device, balance reaction on compliant surfaces and LE strengthening wiht emphasis on right hip strengthening  PT Home Exercise Plan [id number] - plus seated hamstring stretch and seated forward flexion low back stretch, supine hip flexor stretch  Consulted and Agree with Plan of Care Patient          Patient will benefit from skilled therapeutic intervention in order to improve the following deficits and impairments:  Abnormal gait, Decreased activity tolerance, Decreased balance, Decreased endurance, Decreased coordination, Decreased mobility, Decreased range of motion, Difficulty walking, Decreased strength, Impaired sensation  Visit Diagnosis: Other abnormalities of gait and mobility  Unsteadiness on feet  Muscle weakness (generalized)     Problem List Patient Active Problem List   Diagnosis Date Noted  . Dysesthesia 03/29/2019  . Gait disturbance 03/29/2019  . Spinal cord infarction ([place]) 02/20/2019  . Cauda equina syndrome ([place]) 02/13/2019  . DM2 (diabetes mellitus, type 2) ([place]) 02/13/2019  . Syncope 02/13/2019  . Essential hypertension 02/13/2019  . Class 3 severe obesity with  serious comorbidity and body mass index (BMI) of 45.0 to 49.9 in adult ([place]) 10/04/2017  . Class 3 severe obesity without serious comorbidity with body mass index (BMI) of 45.0 to 49.9 in adult ([place]) 04/12/2017  . Vitamin D deficiency 04/12/2017  . Other fatigue 12/31/2016  . SOB (shortness of breath) on exertion 12/31/2016  . Type 2 diabetes mellitus without complication, without long-term current use  of insulin ([place]) 12/31/2016  . Morbid obesity ([place]) 12/31/2016  . OA (osteoarthritis) of hip 12/02/2015    Willow Ora, PTA, [facility] Outpatient Neuro [facility] [address] [place], [place] [address] [phone] 10/11/19, 7:39 PM   Name: Laura Blackwell MRN: [id number] Date of Birth: 05-23-1956

## 2019-10-13 ENCOUNTER — Ambulatory Visit: Payer: 59 | Admitting: Physical Therapy

## 2019-10-13 ENCOUNTER — Other Ambulatory Visit: Payer: Self-pay

## 2019-10-13 ENCOUNTER — Encounter: Payer: Self-pay | Admitting: Physical Therapy

## 2019-10-13 DIAGNOSIS — M6281 Muscle weakness (generalized): Secondary | ICD-10-CM

## 2019-10-13 DIAGNOSIS — R2689 Other abnormalities of gait and mobility: Secondary | ICD-10-CM | POA: Diagnosis not present

## 2019-10-13 DIAGNOSIS — R262 Difficulty in walking, not elsewhere classified: Secondary | ICD-10-CM

## 2019-10-13 DIAGNOSIS — R2681 Unsteadiness on feet: Secondary | ICD-10-CM

## 2019-10-13 NOTE — Therapy (Addendum)
[facility] [address] [place], [place], [address] Phone: [phone]   Fax:  [phone]  Physical Therapy Treatment/Re-Cert  Patient Details  Name: Laura Blackwell MRN: [id number] Date of Birth: Aug 05, 1956 Referring Provider (PT): Carol Ada, MD   Encounter Date: 10/13/2019    10/13/19 0935  PT Visits / Re-Eval  Visit Number 36  Number of Visits 40 (per recert 04/14/47)  Date for PT Re-Evaluation 11/16/19  Authorization  Authorization Type UHC:  60 VL PT/OT/ST- follows caladar year  Authorization - Visit Number 12  Authorization - Number of Visits 60  PT Time Calculation  PT Start Time 0932  PT Stop Time 1015  PT Time Calculation (min) 43 min  PT - End of Session  Equipment Utilized During Treatment Gait belt  Activity Tolerance Patient tolerated treatment well;No increased pain  Behavior During Therapy WFL for tasks assessed/performed    Past Medical History:  Diagnosis Date  . Arthritis   . Diabetes mellitus without complication ([place])   . Edema    in left leg in 1980s on left ankle   . Hyperlipidemia   . Hypertension   . PCOS (polycystic ovarian syndrome)   . Superficial thrombophlebitis   . Swelling   . Venous insufficiency   . Vitamin D deficiency     Past Surgical History:  Procedure Laterality Date  . BREAST BIOPSY Right   . JOINT REPLACEMENT  2011    right hip   . left ankle surgery     . TOTAL HIP ARTHROPLASTY Left 12/02/2015   Procedure: LEFT TOTAL HIP ARTHROPLASTY ANTERIOR APPROACH;  Surgeon: [name], MD;  Location: [facility];  Service: Orthopedics;  Laterality: Left;    There were no vitals filed for this visit.  Subjective Assessment - 10/13/19 0934    Subjective  Had some foot pain this am, gone now. No falls. No other complaints.    Pertinent History  spinal cord CVA 01/2019, diabetes mellitus, HTN, PCOS, vitamin D deficiency, sp right (2011) and left (2017) hip  replacement, peripheral edema, venous insufficiency.    How long can you walk comfortably?  only household distances, states could not walk around track in therapy gym with cane    Diagnostic tests  MRI showed an abnormal focus in the conus medullaris and lower spinal cord adjacent to T12-L1.    Patient Stated Goals  "wants to be 99.99% better, wants to be the best she can be    Currently in Pain?  No/denies    Pain Score  0-No pain         [facility] PT Assessment - 10/13/19 0937      Standardized Balance Assessment   Standardized Balance Assessment  Dynamic Gait Index      Dynamic Gait Index   Level Surface  Mild Impairment    Change in Gait Speed  Normal    Gait with Horizontal Head Turns  Normal    Gait with Vertical Head Turns  Normal    Gait and Pivot Turn  Normal    Step Over Obstacle  Normal    Step Around Obstacles  Mild Impairment    Steps  Mild Impairment    Total Score  21    DGI comment:  <19 indicates fall risk            [facility] Adult PT Treatment/Exercise - 10/13/19 0949      Transfers   Transfers  Sit to Stand;Stand to Sit  Sit to Stand  6: Modified independent (Device/Increase time);With upper extremity assist;Without upper extremity assist;From chair/3-in-1;From bed    Stand to Sit  6: Modified independent (Device/Increase time);Without upper extremity assist;With upper extremity assist;To bed;To chair/3-in-1      Ambulation/Gait   Ambulation/Gait  Yes    Ambulation/Gait Assistance  6: Modified independent (Device/Increase time);5: Supervision    Ambulation/Gait Assistance Details  around gym with session. use of cane to enter/exit gym. no device with supervision around gym with session.     Assistive device  Straight cane;None    Gait Pattern  Step-through pattern;Decreased stride length;Decreased stance time - left;Decreased step length - right;Decreased weight shift to left;Decreased trunk rotation;Narrow base of support;Lateral hip  instability;Trendelenburg    Ambulation Surface  Level;Indoor    Stairs  Yes    Stairs Assistance  5: Supervision    Stairs Assistance Details (indicate cue type and reason)  working on increased strengthening and activity tolerance with repetitive reps    Stair Management Technique  Two rails;Alternating pattern;Forwards    Number of Stairs  4   x3 reps   Height of Stairs  6    Curb  5: Supervision    Curb Details (indicate cue type and reason)  bloc practice with ascending/descending small curb (aerobic step) with min guard assist to supervision assist; then 3 reps with 6 inch curb with min guard assist. increased time and effort to ascend, no issues with descending.       Exercises   Exercises  Other Exercises    Other Exercises   seated at edge of mat with green band around knees: with feet parallel to each other sit<>stands for 10 reps, then in staggered foot position sit<>stands for 10 reps each foot forward, no UE support; in standing: in squat position side stepping left<>right with no UE support, supervision, for 3 laps each way with green band resistance. cues on form and technique.            PT Short Term Goals - 09/15/19 0949      PT SHORT TERM GOAL #1   Title  Patient will be independent with progression of HEP with focus on strength and balance in order to build upon functional gains in therapy. ALL STGs DUE 09/15/2019.    Time  4    Period  Weeks    Status  Partially Met    Target Date  09/15/19      PT SHORT TERM GOAL #2   Title  Patient will improve DGI to at least 19/24 for decreased fall risk.    Baseline  DGI 16/24 08/18/2019; DGI 18/24 09/11/2019    Time  4    Period  Weeks    Status  Partially Met    Target Date  09/15/19      PT SHORT TERM GOAL #3   Title  Pt will ambulate at least 550 ft in 3 minute walk test, with cane, for improved gait endurance and efficiency.    Baseline  507 ft in 3 minute walk 08/18/2019, 493 (2nd trial) in 3MWT with SPC on  09/15/19 - felt weakness in R hip    Time  4    Period  Weeks    Status  Not Met    Target Date  09/15/19      PT SHORT TERM GOAL #4   Title  Pt will ambulate household distances, 50-100 ft, no device with minimal to no R lateral hip sway, for  improved return to independent mobility.    Baseline  minimal sway 09/11/2019; multiple bouts of 50-187ft in gym    Time  4    Period  Weeks    Status  Achieved    Target Date  09/15/19        PT Long Term Goals - 10/13/19 0936      PT LONG TERM GOAL #1   Title  Patient will be independent with final HEP with focus on strength and balance in order to build upon functional gains in therapy. ALL LTGs DUE 10/13/2019.    Baseline  10/10/19: met with current program, will need to be updated as pt progresses and need to work toward community fitness plan    Status  Achieved      PT LONG TERM GOAL #2   Title  Pt will improve DGI score to at least 21/24 for decreased fall risk.    Baseline  10/13/19: 21/24 scored today (no device)    Time  --    Period  --    Status  Achieved      PT LONG TERM GOAL #3   Title  Pt will negotiate curb step and obstacle, no cane, independently, to demonstrate improved strength and balance for improved mobility.    Baseline  10/13/19: continues to need min guard to supervision with cues    Time  --    Period  Weeks    Status  Partially Met      PT LONG TERM GOAL #4   Title  Pt will improve 3 MWT with cane to at least 550 ft, for improved gait endurance and efficiency.    Baseline  10/10/19: 585 feet with cane    Status  Achieved      PT LONG TERM GOAL #5   Title  Patient will ambulate at least 800'modified independently, over indoor level/outdoor unlevel surfaces using cane with step through gait pattern in order to improve community mobility.    Baseline  10/10/19: pt able to go 500 feet with min guard assist with cane    Status  Not Met        Revised LTGs for re-cert:     PT Long Term Goals - 10/15/19 2005       PT LONG TERM GOAL #1   Title  Patient will be independent with final HEP with focus on strength and balance in order to build upon functional gains in therapy. ALL LTGs DUE 11/12/19    Baseline  10/10/19: met with current program, will need to be updated as pt progresses and need to work toward community fitness plan    Time  4    Period  Weeks    Status  On-going    Target Date  11/12/19      PT LONG TERM GOAL #2   Title  --    Baseline  --    Time  --    Period  --    Status  --      PT LONG TERM GOAL #3   Title  Pt will negotiate curb step and obstacle, no cane, independently, to demonstrate improved strength and balance for improved mobility.    Baseline  10/13/19: continues to need min guard to supervision with cues    Time  4    Period  Weeks    Status  On-going      PT LONG TERM GOAL #4   Title  Pt  will undergo 6MWT with cane in order to improve functional mobility in the community and gait endurance.    Baseline  has only performed 3MWT - 585 feet with cane    Time  4    Period  Weeks    Status  New      PT LONG TERM GOAL #5   Title  Patient will ambulate at least 800'modified independently, over indoor level/outdoor unlevel surfaces using cane with step through gait pattern in order to improve community mobility.    Baseline  10/10/19: pt able to go 500 feet with min guard assist with cane    Time  4    Period  Weeks    Status  On-going          10/13/19 0935  Plan  Clinical Impression Statement Today's skilled session initially addressed progress toward remaining LTGs for recert. Pt met her Dynamic Gait Index goal with score of 21/24 today. Partially met curb goal with no device as she continues to need min guard to supervision with cues, improved from HHA. Remainder of session continued to focus on barriers (curbs/stairs) and LE strengthening. The pt is progressing toward goals and should benefit from continued PT to progress toward goals of recert, working toward  community/advance home program for post therapy. Primary PT: LTGs revised/upgraded as appropriate for re-cert for additional 2x week for 4 weeks.   Personal Factors and Comorbidities Past/Current Experience;Comorbidity 3+  Comorbidities spinal cord CVA 01/2019, diabetes mellitus, HTN, PCOS, vitamin D deficiency, sp right (2011) and left (2017) hip replacement, peripheral edema, venous insufficiency.  Examination-Activity Limitations Sit;Squat;Stairs;Stand;Transfers;Locomotion Level  Examination-Participation Restrictions Meal Prep;Driving;Community Activity  Pt will benefit from skilled therapeutic intervention in order to improve on the following deficits Abnormal gait;Decreased activity tolerance;Decreased balance;Decreased endurance;Decreased coordination;Decreased mobility;Decreased range of motion;Difficulty walking;Decreased strength;Impaired sensation  Stability/Clinical Decision Making Evolving/Moderate complexity  Rehab Potential Good  PT Frequency 2x / week  PT Duration 4 weeks  PT Treatment/Interventions ADLs/Self Care Home Management;DME Instruction;Gait training;Stair training;Functional mobility training;Therapeutic activities;Therapeutic exercise;Balance training;Patient/family education;Neuromuscular re-education;Energy conservation;Aquatic Therapy;Orthotic Fit/Training  PT Next Visit Plan PT to recert for 4 more weeks to continue to work on curb negotiation without device, balance reaction on compliant surfaces and LE strengthening wiht emphasis on right hip strengthening  PT Home Exercise Plan [id number] - plus seated hamstring stretch and seated forward flexion low back stretch, supine hip flexor stretch  Consulted and Agree with Plan of Care Patient   Patient will benefit from skilled therapeutic intervention in order to improve the following deficits and impairments:  Abnormal gait, Decreased activity tolerance, Decreased balance, Decreased endurance, Decreased coordination,  Decreased mobility, Decreased range of motion, Difficulty walking, Decreased strength, Impaired sensation  Visit Diagnosis: Other abnormalities of gait and mobility  Unsteadiness on feet  Muscle weakness (generalized)  Difficulty in walking, not elsewhere classified     Problem List Patient Active Problem List   Diagnosis Date Noted  . Dysesthesia 03/29/2019  . Gait disturbance 03/29/2019  . Spinal cord infarction ([name]) 02/20/2019  . Cauda equina syndrome ([place]) 02/13/2019  . DM2 (diabetes mellitus, type 2) ([place]) 02/13/2019  . Syncope 02/13/2019  . Essential hypertension 02/13/2019  . Class 3 severe obesity with serious comorbidity and body mass index (BMI) of 45.0 to 49.9 in adult ([place]) 10/04/2017  . Class 3 severe obesity without serious comorbidity with body mass index (BMI) of 45.0 to 49.9 in adult ([place]) 04/12/2017  . Vitamin D deficiency 04/12/2017  . Other fatigue 12/31/2016  .  SOB (shortness of breath) on exertion 12/31/2016  . Type 2 diabetes mellitus without complication, without long-term current use of insulin ([place]) 12/31/2016  . Morbid obesity ([place]) 12/31/2016  . OA (osteoarthritis) of hip 12/02/2015    Willow Ora, PTA, [facility] Outpatient Neuro [facility] [address], [place],  [address] [phone] 10/13/19, 10:39 AM   Name: Laura Blackwell MRN: [id number] Date of Birth: 25-Jul-1956   Janann August, PT, DPT 10/15/19 8:04 PM

## 2019-10-15 NOTE — Addendum Note (Signed)
Addended by: Arliss Journey on: 10/15/2019 08:09 PM   Modules accepted: Orders

## 2019-10-17 ENCOUNTER — Ambulatory Visit: Payer: 59 | Admitting: Physical Therapy

## 2019-10-17 ENCOUNTER — Encounter: Payer: Self-pay | Admitting: Physical Therapy

## 2019-10-17 ENCOUNTER — Other Ambulatory Visit: Payer: Self-pay

## 2019-10-17 DIAGNOSIS — R262 Difficulty in walking, not elsewhere classified: Secondary | ICD-10-CM

## 2019-10-17 DIAGNOSIS — R2689 Other abnormalities of gait and mobility: Secondary | ICD-10-CM | POA: Diagnosis not present

## 2019-10-17 DIAGNOSIS — M6281 Muscle weakness (generalized): Secondary | ICD-10-CM

## 2019-10-17 DIAGNOSIS — R2681 Unsteadiness on feet: Secondary | ICD-10-CM

## 2019-10-18 NOTE — Therapy (Signed)
[facility] [address] [place], [place], [address] Phone: [phone]   Fax:  [phone]  Physical Therapy Treatment  Patient Details  Name: Laura Blackwell MRN: [id number] Date of Birth: [birth year] Referring Provider (PT): Carol Ada, MD   Encounter Date: 10/17/2019  PT End of Session - 10/17/19 1021    Visit Number  52    Number of Visits  62   per recert 7/67/20   Date for PT Re-Evaluation  11/16/19    Authorization Type  [employer]:  60 VL PT/OT/ST- follows caladar year    Authorization - Visit Number  13    Authorization - Number of Visits  60    PT Start Time  1017    PT Stop Time  1100    PT Time Calculation (min)  43 min    Equipment Utilized During Treatment  Gait belt    Activity Tolerance  Patient tolerated treatment well;No increased pain    Behavior During Therapy  WFL for tasks assessed/performed       Past Medical History:  Diagnosis Date  . Arthritis   . Diabetes mellitus without complication ([place])   . Edema    in left leg in 1980s on left ankle   . Hyperlipidemia   . Hypertension   . PCOS (polycystic ovarian syndrome)   . Superficial thrombophlebitis   . Swelling   . Venous insufficiency   . Vitamin D deficiency     Past Surgical History:  Procedure Laterality Date  . BREAST BIOPSY Right   . JOINT REPLACEMENT  2011    right hip   . left ankle surgery     . TOTAL HIP ARTHROPLASTY Left 12/02/2015   Procedure: LEFT TOTAL HIP ARTHROPLASTY ANTERIOR APPROACH;  Surgeon: [name], MD;  Location: [facility];  Service: Orthopedics;  Laterality: Left;    There were no vitals filed for this visit.  Subjective Assessment - 10/17/19 1018    Subjective  No new complaints. No falls. Continues with buttocks and left leg pain- "raw/burning" feeling. Taking Gabapentin for it, does not feel it's helping. Had the dosage increased on the 3rd by Neuro Md, had a mild improvement at first and not now.    Pertinent History  spinal cord CVA 01/2019, diabetes mellitus, HTN, PCOS, vitamin D deficiency, sp right (2011) and left (2017) hip replacement, peripheral edema, venous insufficiency.    How long can you walk comfortably?  only household distances, states could not walk around track in therapy gym with cane    Diagnostic tests  MRI showed an abnormal focus in the conus medullaris and lower spinal cord adjacent to T12-L1.    Patient Stated Goals  "wants to be 99.99% better, wants to be the best she can be    Currently in Pain?  Yes    Pain Score  8     Pain Location  Generalized   buttocks and into left LE   Pain Descriptors / Indicators  Burning   "raw"   Pain Type  Chronic pain;Neuropathic pain    Pain Onset  More than a month ago    Pain Frequency  Intermittent   "around more" than not   Aggravating Factors   unsure, sitting too long appears to make it worse    Pain Relieving Factors  Gabapentin helps some, getting up           [facility] Adult PT Treatment/Exercise - 10/17/19 1024  Transfers   Transfers  Sit to Stand;Stand to Sit    Sit to Stand  6: Modified independent (Device/Increase time);With upper extremity assist;Without upper extremity assist;From chair/3-in-1;From bed    Stand to Sit  6: Modified independent (Device/Increase time);Without upper extremity assist;With upper extremity assist;To bed;To chair/3-in-1      Ambulation/Gait   Ambulation/Gait  Yes    Ambulation/Gait Assistance  6: Modified independent (Device/Increase time)    Ambulation/Gait Assistance Details  HR 90 prior to walking. 4 minutes of walking at self selected pace with cane working on activity tolerance, HR 121 after wards. distance of 560 feet. mild shortness of breath reported. Both HR and shortness of breath improved with seated rest break.     Assistive device  Straight cane    Gait Pattern  Step-through pattern;Decreased stride length;Decreased stance time - left;Decreased step length -  right;Decreased weight shift to left;Decreased trunk rotation;Narrow base of support;Lateral hip instability;Trendelenburg    Ambulation Surface  Level;Indoor      Knee/Hip Exercises: Standing   Lateral Step Up  Both;1 set;10 reps;Hand Hold: 2;Step Height: 8";Limitations    Lateral Step Up Limitations  cues for decreased UE support/light UE support with each rep.     Forward Step Up  Both;1 set;10 reps;Hand Hold: 2;Step Height: 8";Limitations    Forward Step Up Limitations  cues for form/technique and for decreased UE support    Step Down  Both;1 set;10 reps;Hand Hold: 2;Step Height: 4";Limitations    Step Down Limitations  cues for form/technique    Other Standing Knee Exercises  with green band around knees: side stepping in squat position left<>right for 3 laps each way, then fwd/bwd diagonal stepping in squat position for 3 laps each way, light UE support on bars. cues on form and technique.             PT Short Term Goals - 09/15/19 0949      PT SHORT TERM GOAL #1   Title  Patient will be independent with progression of HEP with focus on strength and balance in order to build upon functional gains in therapy. ALL STGs DUE 09/15/2019.    Time  4    Period  Weeks    Status  Partially Met    Target Date  09/15/19      PT SHORT TERM GOAL #2   Title  Patient will improve DGI to at least 19/24 for decreased fall risk.    Baseline  DGI 16/24 08/18/2019; DGI 18/24 09/11/2019    Time  4    Period  Weeks    Status  Partially Met    Target Date  09/15/19      PT SHORT TERM GOAL #3   Title  Pt will ambulate at least 550 ft in 3 minute walk test, with cane, for improved gait endurance and efficiency.    Baseline  507 ft in 3 minute walk 08/18/2019, 493 (2nd trial) in 3MWT with SPC on 09/15/19 - felt weakness in R hip    Time  4    Period  Weeks    Status  Not Met    Target Date  09/15/19      PT SHORT TERM GOAL #4   Title  Pt will ambulate household distances, 50-100 ft, no device  with minimal to no R lateral hip sway, for improved return to independent mobility.    Baseline  minimal sway 09/11/2019; multiple bouts of 50-176ft in gym    Time  4    Period  Weeks    Status  Achieved    Target Date  09/15/19        PT Long Term Goals - 10/15/19 2005      PT LONG TERM GOAL #1   Title  Patient will be independent with final HEP with focus on strength and balance in order to build upon functional gains in therapy. ALL LTGs DUE 11/12/19    Baseline  10/10/19: met with current program, will need to be updated as pt progresses and need to work toward community fitness plan    Time  4    Period  Weeks    Status  On-going    Target Date  11/12/19      PT LONG TERM GOAL #2   Title  --    Baseline  --    Time  --    Period  --    Status  --      PT LONG TERM GOAL #3   Title  Pt will negotiate curb step and obstacle, no cane, independently, to demonstrate improved strength and balance for improved mobility.    Baseline  10/13/19: continues to need min guard to supervision with cues    Time  4    Period  Weeks    Status  On-going      PT LONG TERM GOAL #4   Title  Pt will undergo 6MWT with cane in order to improve functional mobility in the community and gait endurance.    Baseline  has only performed 3MWT - 585 feet with cane    Time  4    Period  Weeks    Status  New      PT LONG TERM GOAL #5   Title  Patient will ambulate at least 800'modified independently, over indoor level/outdoor unlevel surfaces using cane with step through gait pattern in order to improve community mobility.    Baseline  10/10/19: pt able to go 500 feet with min guard assist with cane    Time  4    Period  Weeks    Status  On-going            Plan - 10/17/19 1022    Clinical Impression Statement  Continued to focus on activity tolerance and strengthening with rest breaks taken. HR max with ex's 138 bpm today with quick recovery with rest breaks. The pt is progressing toward goals  and should benefit from continued PT to progress toward unmet goals.    Personal Factors and Comorbidities  Past/Current Experience;Comorbidity 3+    Comorbidities  spinal cord CVA 01/2019, diabetes mellitus, HTN, PCOS, vitamin D deficiency, sp right (2011) and left (2017) hip replacement, peripheral edema, venous insufficiency.    Examination-Activity Limitations  Sit;Squat;Stairs;Stand;Transfers;Locomotion Level    Examination-Participation Restrictions  Meal Prep;Driving;Community Activity    Stability/Clinical Decision Making  Evolving/Moderate complexity    Rehab Potential  Good    PT Frequency  2x / week    PT Duration  4 weeks    PT Treatment/Interventions  ADLs/Self Care Home Management;DME Instruction;Gait training;Stair training;Functional mobility training;Therapeutic activities;Therapeutic exercise;Balance training;Patient/family education;Neuromuscular re-education;Energy conservation;Aquatic Therapy;Orthotic Fit/Training    PT Next Visit Plan  continue to work on curb negotiation without device, balance reaction on compliant surfaces and LE strengthening wiht emphasis on right hip strengthening    PT Home Exercise Plan  [id number] - plus seated hamstring stretch and seated forward flexion low back stretch, supine hip flexor  stretch    Consulted and Agree with Plan of Care  Patient       Patient will benefit from skilled therapeutic intervention in order to improve the following deficits and impairments:  Abnormal gait, Decreased activity tolerance, Decreased balance, Decreased endurance, Decreased coordination, Decreased mobility, Decreased range of motion, Difficulty walking, Decreased strength, Impaired sensation  Visit Diagnosis: Other abnormalities of gait and mobility  Unsteadiness on feet  Muscle weakness (generalized)  Difficulty in walking, not elsewhere classified     Problem List Patient Active Problem List   Diagnosis Date Noted  . Dysesthesia 03/29/2019  .  Gait disturbance 03/29/2019  . Spinal cord infarction ([place]) 02/20/2019  . Cauda equina syndrome ([place]) 02/13/2019  . DM2 (diabetes mellitus, type 2) ([place]) 02/13/2019  . Syncope 02/13/2019  . Essential hypertension 02/13/2019  . Class 3 severe obesity with serious comorbidity and body mass index (BMI) of 45.0 to 49.9 in adult ([place]) 10/04/2017  . Class 3 severe obesity without serious comorbidity with body mass index (BMI) of 45.0 to 49.9 in adult ([place]) 04/12/2017  . Vitamin D deficiency 04/12/2017  . Other fatigue 12/31/2016  . SOB (shortness of breath) on exertion 12/31/2016  . Type 2 diabetes mellitus without complication, without long-term current use of insulin ([place]) 12/31/2016  . Morbid obesity ([place]) 12/31/2016  . OA (osteoarthritis) of hip 12/02/2015   Willow Ora, PTA, [facility] Outpatient Neuro [facility] [address] [place], [place] [address] [phone] 10/18/19, 11:47 AM  Name: DRESDEN AMENT MRN: [id number] Date of Birth: [birth year]

## 2019-10-20 ENCOUNTER — Encounter: Payer: Self-pay | Admitting: Physical Therapy

## 2019-10-20 ENCOUNTER — Other Ambulatory Visit: Payer: Self-pay

## 2019-10-20 ENCOUNTER — Ambulatory Visit: Payer: 59 | Admitting: Physical Therapy

## 2019-10-20 DIAGNOSIS — R2689 Other abnormalities of gait and mobility: Secondary | ICD-10-CM | POA: Diagnosis not present

## 2019-10-20 DIAGNOSIS — M6281 Muscle weakness (generalized): Secondary | ICD-10-CM

## 2019-10-20 DIAGNOSIS — R2681 Unsteadiness on feet: Secondary | ICD-10-CM

## 2019-10-20 NOTE — Therapy (Signed)
[facility] [address] [place], [place], [address] Phone: [phone]   Fax:  [phone]  Physical Therapy Treatment  Patient Details  Name: Laura Blackwell MRN: [id number] Date of Birth: 30-Jun-1956 Referring Provider (PT): Carol Ada, MD   Encounter Date: 10/20/2019  PT End of Session - 10/20/19 0935    Visit Number  77    Number of Visits  59   per recert 5/57/32   Date for PT Re-Evaluation  11/16/19    Authorization Type  [employer]:  60 VL PT/OT/ST- follows caladar year    Authorization - Visit Number  14    Authorization - Number of Visits  60    PT Start Time  0932    PT Stop Time  2025    PT Time Calculation (min)  42 min    Equipment Utilized During Treatment  Gait belt    Activity Tolerance  Patient tolerated treatment well;No increased pain    Behavior During Therapy  WFL for tasks assessed/performed       Past Medical History:  Diagnosis Date  . Arthritis   . Diabetes mellitus without complication ([place])   . Edema    in left leg in 1980s on left ankle   . Hyperlipidemia   . Hypertension   . PCOS (polycystic ovarian syndrome)   . Superficial thrombophlebitis   . Swelling   . Venous insufficiency   . Vitamin D deficiency     Past Surgical History:  Procedure Laterality Date  . BREAST BIOPSY Right   . JOINT REPLACEMENT  2011    right hip   . left ankle surgery     . TOTAL HIP ARTHROPLASTY Left 12/02/2015   Procedure: LEFT TOTAL HIP ARTHROPLASTY ANTERIOR APPROACH;  Surgeon: [name], MD;  Location: [facility];  Service: Orthopedics;  Laterality: Left;    There were no vitals filed for this visit.  Subjective Assessment - 10/20/19 0934    Subjective  No new complaints. No falls. Was tired and sore after last session.    Pertinent History  spinal cord CVA 01/2019, diabetes mellitus, HTN, PCOS, vitamin D deficiency, sp right (2011) and left (2017) hip replacement, peripheral edema, venous  insufficiency.    How long can you walk comfortably?  only household distances, states could not walk around track in therapy gym with cane    Diagnostic tests  MRI showed an abnormal focus in the conus medullaris and lower spinal cord adjacent to T12-L1.    Patient Stated Goals  "wants to be 99.99% better, wants to be the best she can be    Currently in Pain?  No/denies    Pain Score  0-No pain           [facility] Adult PT Treatment/Exercise - 10/20/19 0935      Transfers   Transfers  Sit to Stand;Stand to Sit    Sit to Stand  6: Modified independent (Device/Increase time);With upper extremity assist;Without upper extremity assist;From chair/3-in-1;From bed    Stand to Sit  6: Modified independent (Device/Increase time);Without upper extremity assist;With upper extremity assist;To bed;To chair/3-in-1      Ambulation/Gait   Ambulation/Gait  Yes    Ambulation/Gait Assistance  6: Modified independent (Device/Increase time)    Ambulation/Gait Assistance Details  HR 86 prior. gait around track for 4 minutes 22 sec's with distance of 728 feet. HR 126 afterwards.     Assistive device  Straight cane    Gait Pattern  Step-through pattern;Decreased stride length;Decreased stance time - left;Decreased step length - right;Decreased weight shift to left;Decreased trunk rotation;Narrow base of support;Lateral hip instability;Trendelenburg    Ambulation Surface  Level;Indoor      Exercises   Exercises  Other Exercises    Other Exercises   with BOSU blue side up; alternating forward step ups with contralateral UE march for 10 reps each side with UE support on bars; on inverted BOSU- single leg stance in center with contralateral fwd/lateral/bwd kicks for 2 sets of 5 reps each bil sides, UE support on bars.       Knee/Hip Exercises: Standing   Other Standing Knee Exercises  resisted 4 way hip kicks with green band for 10 reps each way, UE support for balance. cues on form and technique.     Other  Standing Knee Exercises  with green band around knees: side stepping in squat position left<>right for 3 laps each way, then fwd/bwd diagonal stepping in squat position for 3 laps each way, light UE support on bars. cues on form and technique.                PT Short Term Goals - 09/15/19 0949      PT SHORT TERM GOAL #1   Title  Patient will be independent with progression of HEP with focus on strength and balance in order to build upon functional gains in therapy. ALL STGs DUE 09/15/2019.    Time  4    Period  Weeks    Status  Partially Met    Target Date  09/15/19      PT SHORT TERM GOAL #2   Title  Patient will improve DGI to at least 19/24 for decreased fall risk.    Baseline  DGI 16/24 08/18/2019; DGI 18/24 09/11/2019    Time  4    Period  Weeks    Status  Partially Met    Target Date  09/15/19      PT SHORT TERM GOAL #3   Title  Pt will ambulate at least 550 ft in 3 minute walk test, with cane, for improved gait endurance and efficiency.    Baseline  507 ft in 3 minute walk 08/18/2019, 493 (2nd trial) in 3MWT with SPC on 09/15/19 - felt weakness in R hip    Time  4    Period  Weeks    Status  Not Met    Target Date  09/15/19      PT SHORT TERM GOAL #4   Title  Pt will ambulate household distances, 50-100 ft, no device with minimal to no R lateral hip sway, for improved return to independent mobility.    Baseline  minimal sway 09/11/2019; multiple bouts of 50-151ft in gym    Time  4    Period  Weeks    Status  Achieved    Target Date  09/15/19        PT Long Term Goals - 10/15/19 2005      PT LONG TERM GOAL #1   Title  Patient will be independent with final HEP with focus on strength and balance in order to build upon functional gains in therapy. ALL LTGs DUE 11/12/19    Baseline  10/10/19: met with current program, will need to be updated as pt progresses and need to work toward community fitness plan    Time  4    Period  Weeks    Status  On-going  Target  Date  11/12/19      PT LONG TERM GOAL #2   Title  --    Baseline  --    Time  --    Period  --    Status  --      PT LONG TERM GOAL #3   Title  Pt will negotiate curb step and obstacle, no cane, independently, to demonstrate improved strength and balance for improved mobility.    Baseline  10/13/19: continues to need min guard to supervision with cues    Time  4    Period  Weeks    Status  On-going      PT LONG TERM GOAL #4   Title  Pt will undergo 6MWT with cane in order to improve functional mobility in the community and gait endurance.    Baseline  has only performed 3MWT - 585 feet with cane    Time  4    Period  Weeks    Status  New      PT LONG TERM GOAL #5   Title  Patient will ambulate at least 800'modified independently, over indoor level/outdoor unlevel surfaces using cane with step through gait pattern in order to improve community mobility.    Baseline  10/10/19: pt able to go 500 feet with min guard assist with cane    Time  4    Period  Weeks    Status  On-going            Plan - 10/20/19 0935    Clinical Impression Statement  Today's skilled session continued to focus on activity tolerance and strengthening with max HR 140 with activity. HR recovers quickly with seated rest breaks. The pt is progressing toward goals and should benefit from continued PT to progress toward unmet goals.    Personal Factors and Comorbidities  Past/Current Experience;Comorbidity 3+    Comorbidities  spinal cord CVA 01/2019, diabetes mellitus, HTN, PCOS, vitamin D deficiency, sp right (2011) and left (2017) hip replacement, peripheral edema, venous insufficiency.    Examination-Activity Limitations  Sit;Squat;Stairs;Stand;Transfers;Locomotion Level    Examination-Participation Restrictions  Meal Prep;Driving;Community Activity    Stability/Clinical Decision Making  Evolving/Moderate complexity    Rehab Potential  Good    PT Frequency  2x / week    PT Duration  4 weeks    PT  Treatment/Interventions  ADLs/Self Care Home Management;DME Instruction;Gait training;Stair training;Functional mobility training;Therapeutic activities;Therapeutic exercise;Balance training;Patient/family education;Neuromuscular re-education;Energy conservation;Aquatic Therapy;Orthotic Fit/Training    PT Next Visit Plan  continue to work on curb negotiation without device, balance reaction on compliant surfaces and LE strengthening wiht emphasis on right hip strengthening    PT Home Exercise Plan  [id number] - plus seated hamstring stretch and seated forward flexion low back stretch, supine hip flexor stretch    Consulted and Agree with Plan of Care  Patient       Patient will benefit from skilled therapeutic intervention in order to improve the following deficits and impairments:  Abnormal gait, Decreased activity tolerance, Decreased balance, Decreased endurance, Decreased coordination, Decreased mobility, Decreased range of motion, Difficulty walking, Decreased strength, Impaired sensation  Visit Diagnosis: Other abnormalities of gait and mobility  Unsteadiness on feet  Muscle weakness (generalized)     Problem List Patient Active Problem List   Diagnosis Date Noted  . Dysesthesia 03/29/2019  . Gait disturbance 03/29/2019  . Spinal cord infarction ([place]) 02/20/2019  . Cauda equina syndrome ([place]) 02/13/2019  . DM2 (diabetes mellitus, type 2) (  [place]) 02/13/2019  . Syncope 02/13/2019  . Essential hypertension 02/13/2019  . Class 3 severe obesity with serious comorbidity and body mass index (BMI) of 45.0 to 49.9 in adult ([place]) 10/04/2017  . Class 3 severe obesity without serious comorbidity with body mass index (BMI) of 45.0 to 49.9 in adult ([place]) 04/12/2017  . Vitamin D deficiency 04/12/2017  . Other fatigue 12/31/2016  . SOB (shortness of breath) on exertion 12/31/2016  . Type 2 diabetes mellitus without complication, without long-term current use of insulin ([place]) 12/31/2016  . Morbid  obesity ([place]) 12/31/2016  . OA (osteoarthritis) of hip 12/02/2015    Willow Ora, PTA, [facility] Outpatient Neuro [facility] [address], [place], [place] [address] [phone] 10/20/19, 5:07 PM   Name: Laura Blackwell MRN: [id number] Date of Birth: 29-Jul-1956

## 2019-10-23 ENCOUNTER — Ambulatory Visit
Admission: RE | Admit: 2019-10-23 | Discharge: 2019-10-23 | Disposition: A | Discharge status: Home / Self Care | Payer: 59 | Source: Ambulatory Visit | Attending: Family Medicine | Admitting: Family Medicine

## 2019-10-23 ENCOUNTER — Other Ambulatory Visit: Payer: Self-pay

## 2019-10-23 DIAGNOSIS — Z1231 Encounter for screening mammogram for malignant neoplasm of breast: Secondary | ICD-10-CM

## 2019-10-25 ENCOUNTER — Encounter: Payer: Self-pay | Admitting: Physical Therapy

## 2019-10-25 ENCOUNTER — Ambulatory Visit: Payer: 59 | Admitting: Physical Therapy

## 2019-10-25 ENCOUNTER — Other Ambulatory Visit: Payer: Self-pay

## 2019-10-25 DIAGNOSIS — R2681 Unsteadiness on feet: Secondary | ICD-10-CM

## 2019-10-25 DIAGNOSIS — R2689 Other abnormalities of gait and mobility: Secondary | ICD-10-CM

## 2019-10-25 DIAGNOSIS — M6281 Muscle weakness (generalized): Secondary | ICD-10-CM

## 2019-10-26 NOTE — Therapy (Signed)
[facility] [address] [place], [place], [address] Phone: [phone]   Fax:  [phone]  Physical Therapy Treatment  Patient Details  Name: Laura Blackwell MRN: [id number] Date of Birth: Jan 17, 1956 Referring Provider (PT): Carol Ada, MD   Encounter Date: 10/25/2019     10/25/19 1021  PT Visits / Re-Eval  Visit Number 33  Number of Visits 78 (per recert 0/35/46)  Date for PT Re-Evaluation 11/16/19  Authorization  Authorization Type UHC:  60 VL PT/OT/ST- follows [facility] year  Authorization - Visit Number 15  Authorization - Number of Visits 60  PT Time Calculation  PT Start Time 1017  PT Stop Time 1100  PT Time Calculation (min) 43 min  PT - End of Session  Equipment Utilized During Treatment Gait belt  Activity Tolerance Patient tolerated treatment well;No increased pain  Behavior During Therapy WFL for tasks assessed/performed    Past Medical History:  Diagnosis Date  . Arthritis   . Diabetes mellitus without complication ([place])   . Edema    in left leg in 1980s on left ankle   . Hyperlipidemia   . Hypertension   . PCOS (polycystic ovarian syndrome)   . Superficial thrombophlebitis   . Swelling   . Venous insufficiency   . Vitamin D deficiency     Past Surgical History:  Procedure Laterality Date  . BREAST BIOPSY Right   . JOINT REPLACEMENT  2011    right hip   . left ankle surgery     . TOTAL HIP ARTHROPLASTY Left 12/02/2015   Procedure: LEFT TOTAL HIP ARTHROPLASTY ANTERIOR APPROACH;  Surgeon: [name], MD;  Location: [facility];  Service: Orthopedics;  Laterality: Left;    There were no vitals filed for this visit.     10/25/19 1019  Symptoms/Limitations  Subjective No new complaints. No falls. Still with "tenderness/rawness" in buttocks into back  of both legs.  Pertinent History spinal cord CVA 01/2019, diabetes mellitus, HTN, PCOS, vitamin D deficiency, sp right (2011) and left  (2017) hip replacement, peripheral edema, venous insufficiency.  How long can you walk comfortably? only household distances, states could not walk around track in therapy gym with cane  Diagnostic tests MRI showed an abnormal focus in the conus medullaris and lower spinal cord adjacent to T12-L1.  Patient Stated Goals "wants to be 99.99% better, wants to be the best she can be  Pain Assessment  Currently in Pain? Yes  Pain Score 7  Pain Location Generalized  Pain Orientation Right;Left  Pain Descriptors / Indicators Burning ("raw")  Pain Type Chronic pain;Neuropathic pain  Pain Radiating Towards from      10/25/19 1022  Transfers  Transfers Sit to Stand;Stand to Sit  Sit to Stand 6: Modified independent (Device/Increase time);With upper extremity assist;Without upper extremity assist;From chair/3-in-1;From bed  Stand to Sit 6: Modified independent (Device/Increase time);Without upper extremity assist;With upper extremity assist;To bed;To chair/3-in-1  Ambulation/Gait  Ambulation/Gait Yes  Ambulation/Gait Assistance 6: Modified independent (Device/Increase time)  Ambulation/Gait Assistance Details HR 86 before walking. gait for 5 minutes 20 sec's for 720 feet. HR 125 after with mild shortness of breath reported.   Assistive device Straight cane  Gait Pattern Step-through pattern;Decreased stride length;Decreased stance time - left;Decreased step length - right;Decreased weight shift to left;Decreased trunk rotation;Narrow base of support;Lateral hip instability;Trendelenburg  Ambulation Surface Level;Indoor  Curb Details (indicate cue type and reason) bloc practice with both aerobic steps side by side so to work on approach  and continuation of gait after descent ; then with 6 inch curb for 5 reps with cane, min guard to supervision   Self-Care  Self-Care Other Self-Care Comments  Other Self-Care Comments  pt reports her primary MD recommend [facility] for after PT option for  community fitness. provided pt with contact information.   Neuro Re-ed   Neuro Re-ed Details  for strengthening/muscle re-ed: on open dense blue foam- feet together for EC no head movements, progressing to feet hip width apart for EC head movements left<>right, up<>down and diagonals both ways with min guard to min assist for balance.        PT Short Term Goals - 09/15/19 0949      PT SHORT TERM GOAL #1   Title  Patient will be independent with progression of HEP with focus on strength and balance in order to build upon functional gains in therapy. ALL STGs DUE 09/15/2019.    Time  4    Period  Weeks    Status  Partially Met    Target Date  09/15/19      PT SHORT TERM GOAL #2   Title  Patient will improve DGI to at least 19/24 for decreased fall risk.    Baseline  DGI 16/24 08/18/2019; DGI 18/24 09/11/2019    Time  4    Period  Weeks    Status  Partially Met    Target Date  09/15/19      PT SHORT TERM GOAL #3   Title  Pt will ambulate at least 550 ft in 3 minute walk test, with cane, for improved gait endurance and efficiency.    Baseline  507 ft in 3 minute walk 08/18/2019, 493 (2nd trial) in 3MWT with SPC on 09/15/19 - felt weakness in R hip    Time  4    Period  Weeks    Status  Not Met    Target Date  09/15/19      PT SHORT TERM GOAL #4   Title  Pt will ambulate household distances, 50-100 ft, no device with minimal to no R lateral hip sway, for improved return to independent mobility.    Baseline  minimal sway 09/11/2019; multiple bouts of 50-141ft in gym    Time  4    Period  Weeks    Status  Achieved    Target Date  09/15/19        PT Long Term Goals - 10/15/19 2005      PT LONG TERM GOAL #1   Title  Patient will be independent with final HEP with focus on strength and balance in order to build upon functional gains in therapy. ALL LTGs DUE 11/12/19    Baseline  10/10/19: met with current program, will need to be updated as pt progresses and need to work toward  community fitness plan    Time  4    Period  Weeks    Status  On-going    Target Date  11/12/19      PT LONG TERM GOAL #2   Title  --    Baseline  --    Time  --    Period  --    Status  --      PT LONG TERM GOAL #3   Title  Pt will negotiate curb step and obstacle, no cane, independently, to demonstrate improved strength and balance for improved mobility.    Baseline  10/13/19: continues to need  min guard to supervision with cues    Time  4    Period  Weeks    Status  On-going      PT LONG TERM GOAL #4   Title  Pt will undergo 6MWT with cane in order to improve functional mobility in the community and gait endurance.    Baseline  has only performed 3MWT - 585 feet with cane    Time  4    Period  Weeks    Status  New      PT LONG TERM GOAL #5   Title  Patient will ambulate at least 800'modified independently, over indoor level/outdoor unlevel surfaces using cane with step through gait pattern in order to improve community mobility.    Baseline  10/10/19: pt able to go 500 feet with min guard assist with cane    Time  4    Period  Weeks    Status  On-going         10/25/19 1021  Plan  Clinical Impression Statement Today's skilled session focused initially on providing pt with information on community fitness center recommended by her MD. Angus Seller of session continued to focus on activity tolerance,  curb negotiation and balance reactions. The pt is progressing well toward goals and should benefit from continued PT to progress toward unmet goals.  Personal Factors and Comorbidities Past/Current Experience;Comorbidity 3+  Comorbidities spinal cord CVA 01/2019, diabetes mellitus, HTN, PCOS, vitamin D deficiency, sp right (2011) and left (2017) hip replacement, peripheral edema, venous insufficiency.  Examination-Activity Limitations Sit;Squat;Stairs;Stand;Transfers;Locomotion Level  Examination-Participation Restrictions Meal Prep;Driving;Community Activity  Pt will benefit from  skilled therapeutic intervention in order to improve on the following deficits Abnormal gait;Decreased activity tolerance;Decreased balance;Decreased endurance;Decreased coordination;Decreased mobility;Decreased range of motion;Difficulty walking;Decreased strength;Impaired sensation  Stability/Clinical Decision Making Evolving/Moderate complexity  Rehab Potential Good  PT Frequency 2x / week  PT Duration 4 weeks  PT Treatment/Interventions ADLs/Self Care Home Management;DME Instruction;Gait training;Stair training;Functional mobility training;Therapeutic activities;Therapeutic exercise;Balance training;Patient/family education;Neuromuscular re-education;Energy conservation;Aquatic Therapy;Orthotic Fit/Training  PT Next Visit Plan continue to work on curb negotiation without device, balance reaction on compliant surfaces and LE strengthening wiht emphasis on right hip strengthening  PT Home Exercise Plan [id number] - plus seated hamstring stretch and seated forward flexion low back stretch, supine hip flexor stretch  Consulted and Agree with Plan of Care Patient          Patient will benefit from skilled therapeutic intervention in order to improve the following deficits and impairments:  Abnormal gait, Decreased activity tolerance, Decreased balance, Decreased endurance, Decreased coordination, Decreased mobility, Decreased range of motion, Difficulty walking, Decreased strength, Impaired sensation  Visit Diagnosis: Other abnormalities of gait and mobility  Unsteadiness on feet  Muscle weakness (generalized)     Problem List Patient Active Problem List   Diagnosis Date Noted  . Dysesthesia 03/29/2019  . Gait disturbance 03/29/2019  . Spinal cord infarction ([place]) 02/20/2019  . Cauda equina syndrome ([place]) 02/13/2019  . DM2 (diabetes mellitus, type 2) ([place]) 02/13/2019  . Syncope 02/13/2019  . Essential hypertension 02/13/2019  . Class 3 severe obesity with serious comorbidity and  body mass index (BMI) of 45.0 to 49.9 in adult ([place]) 10/04/2017  . Class 3 severe obesity without serious comorbidity with body mass index (BMI) of 45.0 to 49.9 in adult ([place]) 04/12/2017  . Vitamin D deficiency 04/12/2017  . Other fatigue 12/31/2016  . SOB (shortness of breath) on exertion 12/31/2016  . Type 2 diabetes mellitus without complication, without  long-term current use of insulin ([place]) 12/31/2016  . Morbid obesity ([place]) 12/31/2016  . OA (osteoarthritis) of hip 12/02/2015    Willow Ora, PTA, [facility] [address] [place], [place] [address] [phone] 10/26/19, 7:19 PM   Name: CAMDEN MAZZAFERRO MRN: [id number] Date of Birth: Jul 27, 1956

## 2019-10-27 ENCOUNTER — Ambulatory Visit: Payer: 59 | Admitting: Physical Therapy

## 2019-10-27 ENCOUNTER — Other Ambulatory Visit: Payer: Self-pay

## 2019-10-27 ENCOUNTER — Encounter: Payer: Self-pay | Admitting: Physical Therapy

## 2019-10-27 DIAGNOSIS — R262 Difficulty in walking, not elsewhere classified: Secondary | ICD-10-CM

## 2019-10-27 DIAGNOSIS — R2689 Other abnormalities of gait and mobility: Secondary | ICD-10-CM | POA: Diagnosis not present

## 2019-10-27 DIAGNOSIS — M6281 Muscle weakness (generalized): Secondary | ICD-10-CM

## 2019-10-27 DIAGNOSIS — R2681 Unsteadiness on feet: Secondary | ICD-10-CM

## 2019-10-27 NOTE — Therapy (Signed)
[facility] [address] [place], [place], [address] Phone: [phone]   Fax:  [phone]  Physical Therapy Treatment  Patient Details  Name: Laura Blackwell MRN: [id number] Date of Birth: 1956-05-18 Referring Provider (PT): Carol Ada, MD   Encounter Date: 10/27/2019  PT End of Session - 10/27/19 0937    Visit Number  55    Number of Visits  59   per recert 5/45/62   Date for PT Re-Evaluation  11/16/19    Authorization Type  UHC:  60 VL PT/OT/ST- follows [facility] year    Authorization - Visit Number  16    Authorization - Number of Visits  60    PT Start Time  0932    PT Stop Time  5638    PT Time Calculation (min)  43 min    Equipment Utilized During Treatment  Gait belt    Activity Tolerance  Patient tolerated treatment well;No increased pain    Behavior During Therapy  WFL for tasks assessed/performed       Past Medical History:  Diagnosis Date  . Arthritis   . Diabetes mellitus without complication ([employer])   . Edema    in left leg in 1980s on left ankle   . Hyperlipidemia   . Hypertension   . PCOS (polycystic ovarian syndrome)   . Superficial thrombophlebitis   . Swelling   . Venous insufficiency   . Vitamin D deficiency     Past Surgical History:  Procedure Laterality Date  . BREAST BIOPSY Right   . JOINT REPLACEMENT  2011    right hip   . left ankle surgery     . TOTAL HIP ARTHROPLASTY Left 12/02/2015   Procedure: LEFT TOTAL HIP ARTHROPLASTY ANTERIOR APPROACH;  Surgeon: [name], MD;  Location: [facility];  Service: Orthopedics;  Laterality: Left;    There were no vitals filed for this visit.  Subjective Assessment - 10/27/19 0934    Subjective  No new complaints. No falls. The "tenderness/rawness" is okay today, kept her up all night. Has not called [facility] yet, plans to do so today.    Pertinent History  spinal cord CVA 01/2019, diabetes mellitus, HTN, PCOS, vitamin D deficiency,  sp right (2011) and left (2017) hip replacement, peripheral edema, venous insufficiency.    How long can you walk comfortably?  only household distances, states could not walk around track in therapy gym with cane    Diagnostic tests  MRI showed an abnormal focus in the conus medullaris and lower spinal cord adjacent to T12-L1.    Patient Stated Goals  "wants to be 99.99% better, wants to be the best she can be    Currently in Pain?  Yes    Pain Score  1    "it's a little something there, nothing to complain about"   Pain Location  Generalized    Pain Orientation  Right;Left    Pain Descriptors / Indicators  Burning   "raw"   Pain Type  Chronic pain;Neuropathic pain    Pain Onset  More than a month ago    Pain Frequency  Intermittent   varies in intensity   Aggravating Factors   unsure, sitting for too long at times    Pain Relieving Factors  getting up and moving, Gabapentin             [facility] Adult PT Treatment/Exercise - 10/27/19 0938      Transfers   Transfers  Sit to Stand;Stand to Sit    Sit to Stand  6: Modified independent (Device/Increase time);With upper extremity assist;Without upper extremity assist;From chair/3-in-1;From bed    Stand to Sit  6: Modified independent (Device/Increase time);Without upper extremity assist;With upper extremity assist;To bed;To chair/3-in-1      Ambulation/Gait   Ambulation/Gait  Yes    Ambulation/Gait Assistance  6: Modified independent (Device/Increase time)    Ambulation/Gait Assistance Details  HR 94 before gait. Walking for 5 minutes 20 sec's for 1004 feet, HR 134 upon sitting down. Moderately short of breath. Both improved with sitting down to rest; then second bout of gait in/outdoors with cane with supervision, no balance issues noted. mild shortness of breath at end with HR 125 bpm. both recovered quickly.     Ambulation Distance (Feet)  500 Feet   x1 in/outdoor combined   Assistive device  Straight cane    Gait Pattern   Step-through pattern;Decreased stride length;Decreased stance time - left;Decreased step length - right;Decreased weight shift to left;Decreased trunk rotation;Narrow base of support;Lateral hip instability;Trendelenburg    Ambulation Surface  Level;Indoor          Balance Exercises - 10/27/19 0956      Balance Exercises: Standing   Rockerboard  Anterior/posterior;Lateral;Head turns;EO;EC;30 seconds;10 reps;Limitations    Rockerboard Limitations  performed both ways on balance board with no UE support, occasional touch to walls/chair for balance assistance- rocking the board with EO, progressing to EC; then holding the board steady for EC no head movements, progressing to EC head movements left<>right, up<>down. min guard to min assist for balance with cues on posture/weight shifting to assist with balance.          PT Short Term Goals - 09/15/19 0949      PT SHORT TERM GOAL #1   Title  Patient will be independent with progression of HEP with focus on strength and balance in order to build upon functional gains in therapy. ALL STGs DUE 09/15/2019.    Time  4    Period  Weeks    Status  Partially Met    Target Date  09/15/19      PT SHORT TERM GOAL #2   Title  Patient will improve DGI to at least 19/24 for decreased fall risk.    Baseline  DGI 16/24 08/18/2019; DGI 18/24 09/11/2019    Time  4    Period  Weeks    Status  Partially Met    Target Date  09/15/19      PT SHORT TERM GOAL #3   Title  Pt will ambulate at least 550 ft in 3 minute walk test, with cane, for improved gait endurance and efficiency.    Baseline  507 ft in 3 minute walk 08/18/2019, 493 (2nd trial) in 3MWT with SPC on 09/15/19 - felt weakness in R hip    Time  4    Period  Weeks    Status  Not Met    Target Date  09/15/19      PT SHORT TERM GOAL #4   Title  Pt will ambulate household distances, 50-100 ft, no device with minimal to no R lateral hip sway, for improved return to independent mobility.     Baseline  minimal sway 09/11/2019; multiple bouts of 50-119ft in gym    Time  4    Period  Weeks    Status  Achieved    Target Date  09/15/19  PT Long Term Goals - 10/15/19 2005      PT LONG TERM GOAL #1   Title  Patient will be independent with final HEP with focus on strength and balance in order to build upon functional gains in therapy. ALL LTGs DUE 11/12/19    Baseline  10/10/19: met with current program, will need to be updated as pt progresses and need to work toward community fitness plan    Time  4    Period  Weeks    Status  On-going    Target Date  11/12/19      PT LONG TERM GOAL #2   Title  --    Baseline  --    Time  --    Period  --    Status  --      PT LONG TERM GOAL #3   Title  Pt will negotiate curb step and obstacle, no cane, independently, to demonstrate improved strength and balance for improved mobility.    Baseline  10/13/19: continues to need min guard to supervision with cues    Time  4    Period  Weeks    Status  On-going      PT LONG TERM GOAL #4   Title  Pt will undergo 6MWT with cane in order to improve functional mobility in the community and gait endurance.    Baseline  has only performed 3MWT - 585 feet with cane    Time  4    Period  Weeks    Status  New      PT LONG TERM GOAL #5   Title  Patient will ambulate at least 800'modified independently, over indoor level/outdoor unlevel surfaces using cane with step through gait pattern in order to improve community mobility.    Baseline  10/10/19: pt able to go 500 feet with min guard assist with cane    Time  4    Period  Weeks    Status  On-going          Plan - 10/27/19 1027    Clinical Impression Statement  Today's skilled session continued to focus on activity tolerance, gait on various surfaces and balance reactions with rest breaks taken due to fatigue and to allow pt's HR to decrease. The pt is making steady progress toward goals and should benefit from continued PT to progress  toward unmet goals.    Personal Factors and Comorbidities  Past/Current Experience;Comorbidity 3+    Comorbidities  spinal cord CVA 01/2019, diabetes mellitus, HTN, PCOS, vitamin D deficiency, sp right (2011) and left (2017) hip replacement, peripheral edema, venous insufficiency.    Examination-Activity Limitations  Sit;Squat;Stairs;Stand;Transfers;Locomotion Level    Examination-Participation Restrictions  Meal Prep;Driving;Community Activity    Stability/Clinical Decision Making  Evolving/Moderate complexity    Rehab Potential  Good    PT Frequency  2x / week    PT Duration  4 weeks    PT Treatment/Interventions  ADLs/Self Care Home Management;DME Instruction;Gait training;Stair training;Functional mobility training;Therapeutic activities;Therapeutic exercise;Balance training;Patient/family education;Neuromuscular re-education;Energy conservation;Aquatic Therapy;Orthotic Fit/Training    PT Next Visit Plan  continue to work on curb negotiation without device, balance reaction on compliant surfaces and LE strengthening wiht emphasis on right hip strengthening    PT Home Exercise Plan  [id number] - plus seated hamstring stretch and seated forward flexion low back stretch, supine hip flexor stretch    Consulted and Agree with Plan of Care  Patient       Patient will benefit  from skilled therapeutic intervention in order to improve the following deficits and impairments:  Abnormal gait, Decreased activity tolerance, Decreased balance, Decreased endurance, Decreased coordination, Decreased mobility, Decreased range of motion, Difficulty walking, Decreased strength, Impaired sensation  Visit Diagnosis: Other abnormalities of gait and mobility  Unsteadiness on feet  Muscle weakness (generalized)  Difficulty in walking, not elsewhere classified     Problem List Patient Active Problem List   Diagnosis Date Noted  . Dysesthesia 03/29/2019  . Gait disturbance 03/29/2019  . Spinal cord  infarction ([place]) 02/20/2019  . Cauda equina syndrome ([place]) 02/13/2019  . DM2 (diabetes mellitus, type 2) ([place]) 02/13/2019  . Syncope 02/13/2019  . Essential hypertension 02/13/2019  . Class 3 severe obesity with serious comorbidity and body mass index (BMI) of 45.0 to 49.9 in adult ([place]) 10/04/2017  . Class 3 severe obesity without serious comorbidity with body mass index (BMI) of 45.0 to 49.9 in adult ([place]) 04/12/2017  . Vitamin D deficiency 04/12/2017  . Other fatigue 12/31/2016  . SOB (shortness of breath) on exertion 12/31/2016  . Type 2 diabetes mellitus without complication, without long-term current use of insulin ([place]) 12/31/2016  . Morbid obesity ([place]) 12/31/2016  . OA (osteoarthritis) of hip 12/02/2015    Willow Ora, PTA, [facility] Outpatient Neuro [facility] [address] [place], [place] [address] [phone] 10/27/19, 6:40 PM   Name: Laura Blackwell MRN: [id number] Date of Birth: 17-Dec-1955

## 2019-10-31 ENCOUNTER — Ambulatory Visit: Payer: 59 | Admitting: Physical Therapy

## 2019-11-03 ENCOUNTER — Ambulatory Visit: Payer: 59 | Attending: Family Medicine | Admitting: Physical Therapy

## 2019-11-03 ENCOUNTER — Encounter: Payer: Self-pay | Admitting: Physical Therapy

## 2019-11-03 ENCOUNTER — Other Ambulatory Visit: Payer: Self-pay

## 2019-11-03 DIAGNOSIS — M6281 Muscle weakness (generalized): Secondary | ICD-10-CM | POA: Insufficient documentation

## 2019-11-03 DIAGNOSIS — R2689 Other abnormalities of gait and mobility: Secondary | ICD-10-CM

## 2019-11-03 DIAGNOSIS — R2681 Unsteadiness on feet: Secondary | ICD-10-CM | POA: Diagnosis present

## 2019-11-03 DIAGNOSIS — R262 Difficulty in walking, not elsewhere classified: Secondary | ICD-10-CM | POA: Insufficient documentation

## 2019-11-03 NOTE — Therapy (Signed)
[facility] [address] [place], [place], [address] Phone: [phone]   Fax:  [phone]  Physical Therapy Treatment  Patient Details  Name: Laura Blackwell MRN: [id number] Date of Birth: 16-May-1956 Referring Provider (PT): Carol Ada, MD   Encounter Date: 11/03/2019  PT End of Session - 11/03/19 0942    Visit Number  2    Number of Visits  56   per recert 02/25/30   Date for PT Re-Evaluation  11/16/19    Authorization Type  UHC:  60 VL PT/OT/ST- follows caladar year    Authorization - Visit Number  17    Authorization - Number of Visits  60    PT Start Time  0932    PT Stop Time  5176    PT Time Calculation (min)  43 min    Equipment Utilized During Treatment  Gait belt    Activity Tolerance  Patient tolerated treatment well;No increased pain    Behavior During Therapy  WFL for tasks assessed/performed       Past Medical History:  Diagnosis Date  . Arthritis   . Diabetes mellitus without complication ([place])   . Edema    in left leg in 1980s on left ankle   . Hyperlipidemia   . Hypertension   . PCOS (polycystic ovarian syndrome)   . Superficial thrombophlebitis   . Swelling   . Venous insufficiency   . Vitamin D deficiency     Past Surgical History:  Procedure Laterality Date  . BREAST BIOPSY Right   . JOINT REPLACEMENT  2011    right hip   . left ankle surgery     . TOTAL HIP ARTHROPLASTY Left 12/02/2015   Procedure: LEFT TOTAL HIP ARTHROPLASTY ANTERIOR APPROACH;  Surgeon: [name], MD;  Location: [facility];  Service: Orthopedics;  Laterality: Left;    There were no vitals filed for this visit.  Subjective Assessment - 11/03/19 0937    Subjective  Reports she twisted her ankle on Tuesday of this week, not sure how she did that. Did not fall. Its better today. Has called [facility] who suggested she call her insurance to see if they cover it. She did call her insurance who does not cover "exercise  therapy". She is now reaching back out to [facility] to get cost.    Pertinent History  spinal cord CVA 01/2019, diabetes mellitus, HTN, PCOS, vitamin D deficiency, sp right (2011) and left (2017) hip replacement, peripheral edema, venous insufficiency.    How long can you walk comfortably?  only household distances, states could not walk around track in therapy gym with cane    Diagnostic tests  MRI showed an abnormal focus in the conus medullaris and lower spinal cord adjacent to T12-L1.    Patient Stated Goals  "wants to be 99.99% better, wants to be the best she can be    Currently in Pain?  Yes    Pain Score  6     Pain Location  Generalized   buttocks down to calf on left, buttocks down to thigh on right   Pain Orientation  Right;Left   left>right side   Pain Descriptors / Indicators  Burning   "raw"   Pain Type  Chronic pain;Neuropathic pain    Pain Onset  More than a month ago    Pain Frequency  Intermittent   varies in intensity   Aggravating Factors   unsure, sitting for too long at times  Pain Relieving Factors  getting up  and moving, Gabapentin            [facility] Adult PT Treatment/Exercise - 11/03/19 0943      Transfers   Transfers  Sit to Stand;Stand to Sit    Sit to Stand  6: Modified independent (Device/Increase time);With upper extremity assist;Without upper extremity assist;From chair/3-in-1;From bed    Stand to Sit  6: Modified independent (Device/Increase time);Without upper extremity assist;With upper extremity assist;To bed;To chair/3-in-1      Ambulation/Gait   Ambulation/Gait  Yes    Ambulation/Gait Assistance  6: Modified independent (Device/Increase time)    Ambulation/Gait Assistance Details  HR 107 before gait. 5 minute walk with 678 feet, HR 130 afterwards. Seated rest break brought HR back down to resting rate. gait with cane for 500 feet in/outdoors with HR 135 afterwards. decreased to baseline with rest break.     Ambulation Distance (Feet)  678 Feet    x1, 500 x1 in/outdoor combined   Assistive device  Straight cane    Gait Pattern  Step-through pattern;Decreased stride length;Decreased stance time - left;Decreased step length - right;Decreased weight shift to left;Decreased trunk rotation;Narrow base of support;Lateral hip instability;Trendelenburg    Ambulation Surface  Level;Indoor      Self-Care   Self-Care  Other Self-Care Comments    Other Self-Care Comments   discussed end of plan of care next week. Pt is to call [facility] again today to get information on prices. She is prepared for and in agreement with discharge next week.           Balance Exercises - 11/03/19 1006      Balance Exercises: Standing   Standing Eyes Closed  Narrow base of support (BOS);Wide (BOA);Head turns;Foam/compliant surface;Other reps (comment);30 secs;Limitations    Standing Eyes Closed Limitations  on open dense blue foam in corner with no UE support:  feet together EC no head movements, then feet apart for EC head movements left<>right, up<>down. and diagonals both ways for 10 reps each. rest breaks taken x2 due to fatigue and to allow heart rate to decrease. HR max 120 with this activity. Returned to baseline with rest breaks.            PT Short Term Goals - 09/15/19 0949      PT SHORT TERM GOAL #1   Title  Patient will be independent with progression of HEP with focus on strength and balance in order to build upon functional gains in therapy. ALL STGs DUE 09/15/2019.    Time  4    Period  Weeks    Status  Partially Met    Target Date  09/15/19      PT SHORT TERM GOAL #2   Title  Patient will improve DGI to at least 19/24 for decreased fall risk.    Baseline  DGI 16/24 08/18/2019; DGI 18/24 09/11/2019    Time  4    Period  Weeks    Status  Partially Met    Target Date  09/15/19      PT SHORT TERM GOAL #3   Title  Pt will ambulate at least 550 ft in 3 minute walk test, with cane, for improved gait endurance and efficiency.    Baseline  507 ft  in 3 minute walk 08/18/2019, 493 (2nd trial) in 3MWT with SPC on 09/15/19 - felt weakness in R hip    Time  4    Period  Weeks    Status  Not Met    Target Date  09/15/19      PT SHORT TERM GOAL #4   Title  Pt will ambulate household distances, 50-100 ft, no device with minimal to no R lateral hip sway, for improved return to independent mobility.    Baseline  minimal sway 09/11/2019; multiple bouts of 50-138ft in gym    Time  4    Period  Weeks    Status  Achieved    Target Date  09/15/19        PT Long Term Goals - 10/15/19 2005      PT LONG TERM GOAL #1   Title  Patient will be independent with final HEP with focus on strength and balance in order to build upon functional gains in therapy. ALL LTGs DUE 11/12/19    Baseline  10/10/19: met with current program, will need to be updated as pt progresses and need to work toward community fitness plan    Time  4    Period  Weeks    Status  On-going    Target Date  11/12/19      PT LONG TERM GOAL #2   Title  --    Baseline  --    Time  --    Period  --    Status  --      PT LONG TERM GOAL #3   Title  Pt will negotiate curb step and obstacle, no cane, independently, to demonstrate improved strength and balance for improved mobility.    Baseline  10/13/19: continues to need min guard to supervision with cues    Time  4    Period  Weeks    Status  On-going      PT LONG TERM GOAL #4   Title  Pt will undergo 6MWT with cane in order to improve functional mobility in the community and gait endurance.    Baseline  has only performed 3MWT - 585 feet with cane    Time  4    Period  Weeks    Status  New      PT LONG TERM GOAL #5   Title  Patient will ambulate at least 800'modified independently, over indoor level/outdoor unlevel surfaces using cane with step through gait pattern in order to improve community mobility.    Baseline  10/10/19: pt able to go 500 feet with min guard assist with cane    Time  4    Period  Weeks    Status   On-going            Plan - 11/03/19 0943    Clinical Impression Statement  Today's skilled session continued to focus on activity tolerance, gait on outdoor surfaces and balance reactions. Rest breaks taken to allow pt's HR to return to baseline values. Discussed with pt her POC ends next week and progress to date. Pt is working on getting into [facility] by [facility] for personal training for post PT community fitness. She is in agreement with discharge next week.    Personal Factors and Comorbidities  Past/Current Experience;Comorbidity 3+    Comorbidities  spinal cord CVA 01/2019, diabetes mellitus, HTN, PCOS, vitamin D deficiency, sp right (2011) and left (2017) hip replacement, peripheral edema, venous insufficiency.    Examination-Activity Limitations  Sit;Squat;Stairs;Stand;Transfers;Locomotion Level    Examination-Participation Restrictions  Meal Prep;Driving;Community Activity    Stability/Clinical Decision Making  Evolving/Moderate complexity    Rehab Potential  Good    PT Frequency  2x / week    PT Duration  4 weeks    PT Treatment/Interventions  ADLs/Self Care Home Management;DME Instruction;Gait training;Stair training;Functional mobility training;Therapeutic activities;Therapeutic exercise;Balance training;Patient/family education;Neuromuscular re-education;Energy conservation;Aquatic Therapy;Orthotic Fit/Training    PT Next Visit Plan  continue to work on curb negotiation without device, balance reaction on compliant surfaces and LE strengthening wiht emphasis on right hip strengthening    PT Home Exercise Plan  [id number] - plus seated hamstring stretch and seated forward flexion low back stretch, supine hip flexor stretch    Consulted and Agree with Plan of Care  Patient       Patient will benefit from skilled therapeutic intervention in order to improve the following deficits and impairments:  Abnormal gait, Decreased activity tolerance, Decreased balance, Decreased endurance, Decreased  coordination, Decreased mobility, Decreased range of motion, Difficulty walking, Decreased strength, Impaired sensation  Visit Diagnosis: Unsteadiness on feet  Other abnormalities of gait and mobility  Muscle weakness (generalized)  Difficulty in walking, not elsewhere classified     Problem List Patient Active Problem List   Diagnosis Date Noted  . Dysesthesia 03/29/2019  . Gait disturbance 03/29/2019  . Spinal cord infarction ([place]) 02/20/2019  . Cauda equina syndrome ([place]) 02/13/2019  . DM2 (diabetes mellitus, type 2) ([place]) 02/13/2019  . Syncope 02/13/2019  . Essential hypertension 02/13/2019  . Class 3 severe obesity with serious comorbidity and body mass index (BMI) of 45.0 to 49.9 in adult ([place]) 10/04/2017  . Class 3 severe obesity without serious comorbidity with body mass index (BMI) of 45.0 to 49.9 in adult ([place]) 04/12/2017  . Vitamin D deficiency 04/12/2017  . Other fatigue 12/31/2016  . SOB (shortness of breath) on exertion 12/31/2016  . Type 2 diabetes mellitus without complication, without long-term current use of insulin ([place]) 12/31/2016  . Morbid obesity ([place]) 12/31/2016  . OA (osteoarthritis) of hip 12/02/2015    Willow Ora, PTA, [facility] [address], [place], [place] [address] [phone] 11/03/19, 8:04 PM   Name: REBBECCA OSUNA MRN: [id number] Date of Birth: 03-02-1956

## 2019-11-07 ENCOUNTER — Ambulatory Visit: Payer: 59 | Admitting: Physical Therapy

## 2019-11-10 ENCOUNTER — Ambulatory Visit: Payer: 59 | Admitting: Physical Therapy

## 2019-12-12 ENCOUNTER — Ambulatory Visit: Payer: 59 | Admitting: Physical Therapy

## 2019-12-15 ENCOUNTER — Other Ambulatory Visit: Payer: Self-pay

## 2019-12-15 ENCOUNTER — Ambulatory Visit: Payer: 59 | Attending: Family Medicine | Admitting: Physical Therapy

## 2019-12-15 DIAGNOSIS — R2689 Other abnormalities of gait and mobility: Secondary | ICD-10-CM | POA: Diagnosis present

## 2019-12-15 DIAGNOSIS — M6281 Muscle weakness (generalized): Secondary | ICD-10-CM | POA: Insufficient documentation

## 2019-12-15 DIAGNOSIS — R262 Difficulty in walking, not elsewhere classified: Secondary | ICD-10-CM | POA: Insufficient documentation

## 2019-12-15 DIAGNOSIS — R2681 Unsteadiness on feet: Secondary | ICD-10-CM | POA: Diagnosis present

## 2019-12-15 NOTE — Therapy (Signed)
[facility] [address] [place], [place], [address] Phone: [phone]   Fax:  [phone]  Physical Therapy Treatment/Re-Cert  Patient Details  Name: Laura Blackwell MRN: [id number] Date of Birth: Jun 08, 1956 Referring Provider (PT): Carol Ada, MD   Encounter Date: 12/15/2019  PT End of Session - 12/15/19 0837    Visit Number  22    Number of Visits  63   per re-cert 5/74/73   Date for PT Re-Evaluation  02/13/20    Authorization Type  UHC:  60 VL PT/OT/ST- follows caladar year    Authorization - Visit Number  28    Authorization - Number of Visits  60    PT Start Time  0800    PT Stop Time  0830    PT Time Calculation (min)  30 min    Activity Tolerance  --   limited due to pt not wearing R boot      Past Medical History:  Diagnosis Date  . Arthritis   . Diabetes mellitus without complication ([place])   . Edema    in left leg in 1980s on left ankle   . Hyperlipidemia   . Hypertension   . PCOS (polycystic ovarian syndrome)   . Superficial thrombophlebitis   . Swelling   . Venous insufficiency   . Vitamin D deficiency     Past Surgical History:  Procedure Laterality Date  . BREAST BIOPSY Right   . JOINT REPLACEMENT  2011    right hip   . left ankle surgery     . TOTAL HIP ARTHROPLASTY Left 12/02/2015   Procedure: LEFT TOTAL HIP ARTHROPLASTY ANTERIOR APPROACH;  Surgeon: [name], MD;  Location: [facility];  Service: Orthopedics;  Laterality: Left;    There were no vitals filed for this visit.  Subjective Assessment - 12/15/19 0802    Subjective  Had a fall a little over a month ago - was stepping off a step and feels like her leg gave out (not sure which one). Went to urgent care and she had a fracture in her R elbow and the orthopedic doctor also assessed her hips due to previous hip replacements and states there was just bruising. Hasn't been doing her exercises at home as much, now in front of  hips just feel tight. Went to [employer] yesterday and was in the store for 2.5 hours walking with the cart. Still needs to call [facility]. Went to the podiatrist and got shots last week in her R foot due to foot pain. Now has a boot on her R foot to help it heal, but is not wearing it today.    Pertinent History  spinal cord CVA 01/2019, diabetes mellitus, HTN, PCOS, vitamin D deficiency, sp right (2011) and left (2017) hip replacement, peripheral edema, venous insufficiency.    How long can you walk comfortably?  only household distances, states could not walk around track in therapy gym with cane    Diagnostic tests  MRI showed an abnormal focus in the conus medullaris and lower spinal cord adjacent to T12-L1.    Patient Stated Goals  "wants to be 99.99% better, wants to be the best she can be    Currently in Pain?  No/denies    Pain Onset  More than a month ago                       [facility] Adult PT Treatment/Exercise - 12/15/19 0001  Self-Care   Self-Care  Other Self-Care Comments    Other Self-Care Comments   see Plan for further information - PT called podiatrist regarding status of boot on R foot (due to pt receiving recent shots in her foot) and pt supposed to have boot on at all times for standing and walking (pt not wearing boot today), discussed information with pt and to re-schedule once pt is cleared to participate in therapy. Also discussed POC with pt - continuing when appropriate for an additional 2x week for 3 weeks to finalize HEP, continue with curb training, finalize community fitness plan and assess goals - pt verbalized understanding              PT Education - 12/15/19 0837    Education Details  see self-care    Person(s) Educated  Patient    Methods  Explanation    Comprehension  Verbalized understanding       PT Short Term Goals - 09/15/19 0949      PT SHORT TERM GOAL #1   Title  Patient will be independent with progression of HEP with focus on  strength and balance in order to build upon functional gains in therapy. ALL STGs DUE 09/15/2019.    Time  4    Period  Weeks    Status  Partially Met    Target Date  09/15/19      PT SHORT TERM GOAL #2   Title  Patient will improve DGI to at least 19/24 for decreased fall risk.    Baseline  DGI 16/24 08/18/2019; DGI 18/24 09/11/2019    Time  4    Period  Weeks    Status  Partially Met    Target Date  09/15/19      PT SHORT TERM GOAL #3   Title  Pt will ambulate at least 550 ft in 3 minute walk test, with cane, for improved gait endurance and efficiency.    Baseline  507 ft in 3 minute walk 08/18/2019, 493 (2nd trial) in 3MWT with SPC on 09/15/19 - felt weakness in R hip    Time  4    Period  Weeks    Status  Not Met    Target Date  09/15/19      PT SHORT TERM GOAL #4   Title  Pt will ambulate household distances, 50-100 ft, no device with minimal to no R lateral hip sway, for improved return to independent mobility.    Baseline  minimal sway 09/11/2019; multiple bouts of 50-118ft in gym    Time  4    Period  Weeks    Status  Achieved    Target Date  09/15/19        PT Long Term Goals - 10/15/19 2005      PT LONG TERM GOAL #1   Title  Patient will be independent with final HEP with focus on strength and balance in order to build upon functional gains in therapy. ALL LTGs DUE 11/12/19    Baseline  10/10/19: met with current program, will need to be updated as pt progresses and need to work toward community fitness plan    Time  4    Period  Weeks    Status  On-going    Target Date  11/12/19      PT LONG TERM GOAL #2   Title  --    Baseline  --    Time  --    Period  --  Status  --      PT LONG TERM GOAL #3   Title  Pt will negotiate curb step and obstacle, no cane, independently, to demonstrate improved strength and balance for improved mobility.    Baseline  10/13/19: continues to need min guard to supervision with cues    Time  4    Period  Weeks    Status  On-going       PT LONG TERM GOAL #4   Title  Pt will undergo 6MWT with cane in order to improve functional mobility in the community and gait endurance.    Baseline  has only performed 3MWT - 585 feet with cane    Time  4    Period  Weeks    Status  New      PT LONG TERM GOAL #5   Title  Patient will ambulate at least 800'modified independently, over indoor level/outdoor unlevel surfaces using cane with step through gait pattern in order to improve community mobility.    Baseline  10/10/19: pt able to go 500 feet with min guard assist with cane    Time  4    Period  Weeks    Status  On-going      Revised/on-going LTGs for re-cert:     PT Long Term Goals - 12/15/19 1252      PT LONG TERM GOAL #1   Title  Patient will be independent with final HEP with focus on strength and balance in order to build upon functional gains in therapy. ALL LTGs DUE 01/19/20    Baseline  10/10/19: met with current program, will need to be updated as pt progresses and need to work toward community fitness plan    Time  5   written for 3 week POC - however 5 weeks due to delay in scheduling   Period  Weeks    Status  On-going    Target Date  01/19/20      PT LONG TERM GOAL #2   Title  Pt will improve DGI score to at least 21/24 for decreased fall risk.    Time  5    Period  Weeks    Status  New      PT LONG TERM GOAL #3   Title  Pt will negotiate curb step and obstacle, no cane, independently, to demonstrate improved strength and balance for improved mobility.    Baseline  10/13/19: continues to need min guard to supervision with cues    Time  5    Period  Weeks    Status  On-going      PT LONG TERM GOAL #4   Title  Pt will undergo 6MWT with cane in order to improve functional mobility in the community and gait endurance.    Baseline  has only performed 3MWT - 585 feet with cane    Time  5    Period  Weeks    Status  On-going      PT LONG TERM GOAL #5   Title  Patient will ambulate at least 800'modified  independently, over indoor level/outdoor unlevel surfaces using cane with step through gait pattern in order to improve community mobility.    Baseline  10/10/19: pt able to go 500 feet with min guard assist with cane    Time  5    Period  Weeks    Status  On-going         Plan - 12/15/19 1249  Clinical Impression Statement  Pt arrives to PT today after not being seen since 11/03/19 due to pt having a fall stepping off of a step and fracturing her R elbow. Pt was supposed to only have 2 more visits with anticipated discharge the next week. Since then pt has not done as much walking/exercise and has an increased fear of falling. She reports increased BLE stiffness/tightness in B hip flexors due to decreased activity. Has not yet followed up with [facility] regarding a community fitness program. Pt reports she saw the podiatrist last week regarding R foot pain and got 2 shots in her R foot. Pt reports she has a boot for her R foot that she has been wearing just while in the house and not to therapy session today. Therapist called pt's podiatrist to get clarification on wearing R boot - with podiatrist stating that pt needs to be wearing it for all weight bearing activity and walking until cleared (pt was supposed to have an appointment yesterday, but had to miss it). Therapist provided education to pt about current precautions and need to wear boot at all times for weight bearing until cleared by her doctor. Pt verbalized understanding - pt to call her podiatrist after session today to make an appointment and to call back to PT office to schedule a couple more sessions to assess goals and review HEP/community fitness program for discharge. Will re-cert for an additional 2x week for 3 weeks. Unable to assess LTGs today - revised/ongoing as appropriate.    Personal Factors and Comorbidities  Past/Current Experience;Comorbidity 3+    Comorbidities  spinal cord CVA 01/2019, diabetes mellitus, HTN, PCOS, vitamin D  deficiency, sp right (2011) and left (2017) hip replacement, peripheral edema, venous insufficiency.    Examination-Activity Limitations  Sit;Squat;Stairs;Stand;Transfers;Locomotion Level    Examination-Participation Restrictions  Meal Prep;Driving;Community Activity    Stability/Clinical Decision Making  Evolving/Moderate complexity    Rehab Potential  Good    PT Frequency  2x / week    PT Duration  3 weeks    PT Treatment/Interventions  ADLs/Self Care Home Management;DME Instruction;Gait training;Stair training;Functional mobility training;Therapeutic activities;Therapeutic exercise;Balance training;Patient/family education;Neuromuscular re-education;Energy conservation;Aquatic Therapy;Orthotic Fit/Training    PT Next Visit Plan  check goals, finalize HEP/community fitness program, continue to work on curb negotiation without device, balance reaction on compliant surfaces and LE strengthening wiht emphasis on right hip strengthening    PT Home Exercise Plan  [id number] - plus seated hamstring stretch and seated forward flexion low back stretch, supine hip flexor stretch    Consulted and Agree with Plan of Care  Patient       Patient will benefit from skilled therapeutic intervention in order to improve the following deficits and impairments:  Abnormal gait, Decreased activity tolerance, Decreased balance, Decreased endurance, Decreased coordination, Decreased mobility, Decreased range of motion, Difficulty walking, Decreased strength, Impaired sensation  Visit Diagnosis: Unsteadiness on feet  Other abnormalities of gait and mobility  Muscle weakness (generalized)  Difficulty in walking, not elsewhere classified     Problem List Patient Active Problem List   Diagnosis Date Noted  . Dysesthesia 03/29/2019  . Gait disturbance 03/29/2019  . Spinal cord infarction ([place]) 02/20/2019  . Cauda equina syndrome ([place]) 02/13/2019  . DM2 (diabetes mellitus, type 2) ([place]) 02/13/2019  . Syncope  02/13/2019  . Essential hypertension 02/13/2019  . Class 3 severe obesity with serious comorbidity and body mass index (BMI) of 45.0 to 49.9 in adult ([place]) 10/04/2017  . Class 3 severe obesity  without serious comorbidity with body mass index (BMI) of 45.0 to 49.9 in adult [facility]) 04/12/2017  . Vitamin D deficiency 04/12/2017  . Other fatigue 12/31/2016  . SOB (shortness of breath) on exertion 12/31/2016  . Type 2 diabetes mellitus without complication, without long-term current use of insulin ([place]) 12/31/2016  . Morbid obesity ([place]) 12/31/2016  . OA (osteoarthritis) of hip 12/02/2015    Arliss Journey, PT, DPT  12/15/2019, 12:51 PM  [facility] [address] [place], [place], [address] Phone: [phone]   Fax:  [phone]  Name: Laura Blackwell MRN: [id number] Date of Birth: 1956/02/24

## 2020-01-08 ENCOUNTER — Other Ambulatory Visit: Payer: Self-pay

## 2020-01-08 ENCOUNTER — Ambulatory Visit: Payer: 59 | Attending: Family Medicine | Admitting: Physical Therapy

## 2020-01-08 DIAGNOSIS — M6281 Muscle weakness (generalized): Secondary | ICD-10-CM | POA: Diagnosis present

## 2020-01-08 DIAGNOSIS — R2681 Unsteadiness on feet: Secondary | ICD-10-CM | POA: Insufficient documentation

## 2020-01-08 DIAGNOSIS — R262 Difficulty in walking, not elsewhere classified: Secondary | ICD-10-CM | POA: Diagnosis present

## 2020-01-08 DIAGNOSIS — R29818 Other symptoms and signs involving the nervous system: Secondary | ICD-10-CM | POA: Diagnosis present

## 2020-01-08 DIAGNOSIS — R2689 Other abnormalities of gait and mobility: Secondary | ICD-10-CM | POA: Insufficient documentation

## 2020-01-08 NOTE — Therapy (Signed)
[facility] [address] [place], [place], [address] Phone: [phone]   Fax:  [phone]  Physical Therapy Treatment  Patient Details  Name: Laura Blackwell MRN: [id number] Date of Birth: 1956/06/03 Referring Provider (PT): Carol Ada, MD   Encounter Date: 01/08/2020  PT End of Session - 01/08/20 0843    Visit Number  32    Number of Visits  63   per re-cert 8/82/80   Date for PT Re-Evaluation  02/13/20    Authorization Type  U[employer]:  60 VL PT/OT/ST- follows caladar year    Authorization - Visit Number  29    Authorization - Number of Visits  60    PT Start Time  0801    PT Stop Time  0843    PT Time Calculation (min)  42 min    Equipment Utilized During Treatment  Gait belt    Activity Tolerance  Patient tolerated treatment well   limited due to pt not wearing R boot   Behavior During Therapy  WFL for tasks assessed/performed       Past Medical History:  Diagnosis Date  . Arthritis   . Diabetes mellitus without complication ([place])   . Edema    in left leg in 1980s on left ankle   . Hyperlipidemia   . Hypertension   . PCOS (polycystic ovarian syndrome)   . Superficial thrombophlebitis   . Swelling   . Venous insufficiency   . Vitamin D deficiency     Past Surgical History:  Procedure Laterality Date  . BREAST BIOPSY Right   . JOINT REPLACEMENT  2011    right hip   . left ankle surgery     . TOTAL HIP ARTHROPLASTY Left 12/02/2015   Procedure: LEFT TOTAL HIP ARTHROPLASTY ANTERIOR APPROACH;  Surgeon: [name], MD;  Location: [facility];  Service: Orthopedics;  Laterality: Left;    There were no vitals filed for this visit.  Subjective Assessment - 01/08/20 0804    Subjective  Returns to PT today with a letter to resume therapy. No falls. Nerves this morning feel a little worse. Looked into the [facility] and will be joining there. Wants to practice curbs and walking over something. Currently during  walking program in [employer] or [employer]    Pertinent History  spinal cord CVA 01/2019, diabetes mellitus, HTN, PCOS, vitamin D deficiency, sp right (2011) and left (2017) hip replacement, peripheral edema, venous insufficiency.    How long can you walk comfortably?  only household distances, states could not walk around track in therapy gym with cane    Diagnostic tests  MRI showed an abnormal focus in the conus medullaris and lower spinal cord adjacent to T12-L1.    Patient Stated Goals  "wants to be 99.99% better, wants to be the best she can be    Currently in Pain?  No/denies    Pain Onset  More than a month ago                       [facility] Adult PT Treatment/Exercise - 01/08/20 0349      Ambulation/Gait   Ambulation/Gait  Yes    Ambulation/Gait Assistance  6: Modified independent (Device/Increase time)    Assistive device  Straight cane    Gait Pattern  Step-through pattern;Decreased stride length;Decreased stance time - left;Decreased step length - right;Decreased weight shift to left;Decreased trunk rotation;Narrow base of support;Lateral hip instability;Trendelenburg  Ambulation Surface  Level;Indoor    Gait Comments  3MWT pre HR: 88, O2: 99%, post HR: 128 bpm, O2: 96%, RPE: 7/10 - ambulated 472'      Therapeutic Activites    Therapeutic Activities  Other Therapeutic Activities    Other Therapeutic Activities  discussed POC with pt and optimal fitness plan post discharge - pt looking into getting [employer] membership, discussed with pt beginning walking program at [facility] since they have an indoor track and currently pt too fearful to ambulate by herself outdoors with [facility] or with RW (would have to carry down 5 steps) due to fear of falling, also continued to educate on importance of performing HEP post D/C       Knee/Hip Exercises: Stretches   Hip Flexor Stretch  Both;2 reps;60 seconds    Hip Flexor Stretch Limitations  modified thomas test position            Access Code: [email or web address] URL: [email or web address] Date: 01/08/2020 Prepared by: Janann August  Reviewed bolded exercises below for pt's prior HEP (pt reports she has not been performing)  Exercises Standing Hip Abduction with Counter Support - 2 x daily - 7 x weekly - 5 reps - 3 sets Standing Marching - 2 x daily - 7 x weekly - 10 reps - 2 sets Seated Ankle Plantar Flexion with Resistance Loop - 1 x daily - 7 x weekly - 10 reps - 2-3 sets - 3 sec hold Mini Squat - 1 x daily - 7 x weekly - 10 reps - 2 sets Clamshell - 2 x daily - 7 x weekly - 10 reps - 2 sets - both R and L, use of red theraband  Backward Walking with Counter Support - 2 x daily - 7 x weekly - 2 reps Tandem Walking with Counter Support - 2 x daily - 7 x weekly - 2 sets Side Stepping with Resistance at Thighs - 2 x daily - 7 x weekly - 2 sets ITB Stretch at Wall - 1 x daily - 5 x weekly - 3 reps - 1 sets - 30 sec hold Single Leg Stance with Support - 2 x daily - 7 x weekly - 3 reps - 1 sets - 10 sec hold - cues to keep a soft bend in the knee, performed at countertop  Step Taps on Low Step - 1-2 x daily - 7 x weekly - 3 reps - 1 sets - 3 sec hold Alternating Heel Raises - 1 x daily - 5 x weekly - 10 reps - 2 sets - cues to keep hips forward and to push through toes for push off     PT Education - 01/08/20 1217    Education Details  began to review HEP, aerobic exercise for community fitness program    Person(s) Educated  Patient    Methods  Explanation;Demonstration    Comprehension  Verbalized understanding;Returned demonstration;Need further instruction       PT Short Term Goals - 09/15/19 0949      PT SHORT TERM GOAL #1   Title  Patient will be independent with progression of HEP with focus on strength and balance in order to build upon functional gains in therapy. ALL STGs DUE 09/15/2019.    Time  4    Period  Weeks    Status  Partially Met    Target Date  09/15/19      PT SHORT TERM GOAL #2    Title  Patient will improve DGI  to at least 19/24 for decreased fall risk.    Baseline  DGI 16/24 08/18/2019; DGI 18/24 09/11/2019    Time  4    Period  Weeks    Status  Partially Met    Target Date  09/15/19      PT SHORT TERM GOAL #3   Title  Pt will ambulate at least 550 ft in 3 minute walk test, with cane, for improved gait endurance and efficiency.    Baseline  507 ft in 3 minute walk 08/18/2019, 493 (2nd trial) in 3MWT with SPC on 09/15/19 - felt weakness in R hip    Time  4    Period  Weeks    Status  Not Met    Target Date  09/15/19      PT SHORT TERM GOAL #4   Title  Pt will ambulate household distances, 50-100 ft, no device with minimal to no R lateral hip sway, for improved return to independent mobility.    Baseline  minimal sway 09/11/2019; multiple bouts of 50-140ft in gym    Time  4    Period  Weeks    Status  Achieved    Target Date  09/15/19        PT Long Term Goals - 01/08/20 0818      PT LONG TERM GOAL #1   Title  Patient will be independent with final HEP with focus on strength and balance in order to build upon functional gains in therapy. ALL LTGs DUE 01/19/20    Baseline  10/10/19: met with current program, will need to be updated as pt progresses and need to work toward community fitness plan    Time  5   written for 3 week POC - however 5 weeks due to delay in scheduling   Period  Weeks    Status  On-going      PT LONG TERM GOAL #2   Title  Pt will improve DGI score to at least 21/24 for decreased fall risk.    Time  5    Period  Weeks    Status  New      PT LONG TERM GOAL #3   Title  Pt will negotiate curb step and obstacle, no cane, independently, to demonstrate improved strength and balance for improved mobility.    Baseline  10/13/19: continues to need min guard to supervision with cues    Time  5    Period  Weeks    Status  On-going      PT LONG TERM GOAL #4   Title  Pt will undergo 6MWT with cane in order to improve functional mobility in  the community and gait endurance.    Baseline  3MWT on 01/08/20 - 472'    Time  5    Period  Weeks    Status  On-going      PT LONG TERM GOAL #5   Title  Patient will ambulate at least 800'modified independently, over indoor level/outdoor unlevel surfaces using cane with step through gait pattern in order to improve community mobility.    Baseline  10/10/19: pt able to go 500 feet with min guard assist with cane    Time  5    Period  Weeks    Status  On-going            Plan - 01/08/20 1221    Clinical Impression Statement  Pt arrives to PT session today with doctor's note  to resume PT. Focus of today's session was re-assessing 3MWT, discussing on-going community fitness program, and reviewing pt's HEP. Since pt had her fall approx. 2 months ago, pt has not been performing her HEP, will finish reviewing at next session. Pt able to ambulate 22' today with 3MWT with SPC with pre HR 88 bpm and post HR 128 bpm (previously was able to ambulate 585'). Will continue to progress towards LTGs.    Personal Factors and Comorbidities  Past/Current Experience;Comorbidity 3+    Comorbidities  spinal cord CVA 01/2019, diabetes mellitus, HTN, PCOS, vitamin D deficiency, sp right (2011) and left (2017) hip replacement, peripheral edema, venous insufficiency.    Examination-Activity Limitations  Sit;Squat;Stairs;Stand;Transfers;Locomotion Level    Examination-Participation Restrictions  Meal Prep;Driving;Community Activity    Stability/Clinical Decision Making  Evolving/Moderate complexity    Rehab Potential  Good    PT Frequency  2x / week    PT Duration  3 weeks    PT Treatment/Interventions  ADLs/Self Care Home Management;DME Instruction;Gait training;Stair training;Functional mobility training;Therapeutic activities;Therapeutic exercise;Balance training;Patient/family education;Neuromuscular re-education;Energy conservation;Aquatic Therapy;Orthotic Fit/Training    PT Next Visit Plan  finish revewing  HEP, finalize HEP/community fitness program, continue to work on curb negotiation without device and SPC, balance reaction on compliant surfaces and LE strengthening wiht emphasis on right hip strengthening    PT Home Exercise Plan  [email or web address] - plus seated hamstring stretch and seated forward flexion low back stretch, supine hip flexor stretch    Consulted and Agree with Plan of Care  Patient       Patient will benefit from skilled therapeutic intervention in order to improve the following deficits and impairments:  Abnormal gait, Decreased activity tolerance, Decreased balance, Decreased endurance, Decreased coordination, Decreased mobility, Decreased range of motion, Difficulty walking, Decreased strength, Impaired sensation  Visit Diagnosis: Unsteadiness on feet  Muscle weakness (generalized)  Difficulty in walking, not elsewhere classified  Other abnormalities of gait and mobility     Problem List Patient Active Problem List   Diagnosis Date Noted  . Dysesthesia 03/29/2019  . Gait disturbance 03/29/2019  . Spinal cord infarction ([place]) 02/20/2019  . Cauda equina syndrome ([place]) 02/13/2019  . DM2 (diabetes mellitus, type 2) ([place]) 02/13/2019  . Syncope 02/13/2019  . Essential hypertension 02/13/2019  . Class 3 severe obesity with serious comorbidity and body mass index (BMI) of 45.0 to 49.9 in adult ([place]) 10/04/2017  . Class 3 severe obesity without serious comorbidity with body mass index (BMI) of 45.0 to 49.9 in adult ([place]) 04/12/2017  . Vitamin D deficiency 04/12/2017  . Other fatigue 12/31/2016  . SOB (shortness of breath) on exertion 12/31/2016  . Type 2 diabetes mellitus without complication, without long-term current use of insulin ([place]) 12/31/2016  . Morbid obesity ([place]) 12/31/2016  . OA (osteoarthritis) of hip 12/02/2015    Arliss Journey, PT, DPT  01/08/2020, 12:26 PM  [facility] [address] [place], [place], [address] Phone: [phone]   Fax:  [phone]  Name: COLINDA BARTH MRN: [id number] Date of Birth: [birth year]

## 2020-01-11 ENCOUNTER — Ambulatory Visit: Payer: 59 | Admitting: Physical Therapy

## 2020-01-11 ENCOUNTER — Other Ambulatory Visit: Payer: Self-pay

## 2020-01-11 DIAGNOSIS — M6281 Muscle weakness (generalized): Secondary | ICD-10-CM

## 2020-01-11 DIAGNOSIS — R2689 Other abnormalities of gait and mobility: Secondary | ICD-10-CM

## 2020-01-11 DIAGNOSIS — R2681 Unsteadiness on feet: Secondary | ICD-10-CM

## 2020-01-11 DIAGNOSIS — R262 Difficulty in walking, not elsewhere classified: Secondary | ICD-10-CM

## 2020-01-11 NOTE — Therapy (Signed)
[facility] [address] [place], [place], [address] Phone: [phone]   Fax:  [phone]  Physical Therapy Treatment  Patient Details  Name: Laura Blackwell MRN: [id number] Date of Birth: 31-Jul-1956 Referring Provider (PT): Carol Ada, MD   Encounter Date: 01/11/2020  PT End of Session - 01/11/20 1105    Visit Number  17    Number of Visits  63   per re-cert 6/96/29   Date for PT Re-Evaluation  02/13/20    Authorization Type  UHC:  60 VL PT/OT/ST- follows cal[place] year    Authorization - Visit Number  3    Authorization - Number of Visits  60    PT Start Time  [phone]    PT Stop Time  1014    PT Time Calculation (min)  40 min    Equipment Utilized During Treatment  Gait belt    Activity Tolerance  Patient tolerated treatment well    Behavior During Therapy  WFL for tasks assessed/performed       Past Medical History:  Diagnosis Date  . Arthritis   . Diabetes mellitus without complication ([place])   . Edema    in left leg in 1980s on left ankle   . Hyperlipidemia   . Hypertension   . PCOS (polycystic ovarian syndrome)   . Superficial thrombophlebitis   . Swelling   . Venous insufficiency   . Vitamin D deficiency     Past Surgical History:  Procedure Laterality Date  . BREAST BIOPSY Right   . JOINT REPLACEMENT  2011    right hip   . left ankle surgery     . TOTAL HIP ARTHROPLASTY Left 12/02/2015   Procedure: LEFT TOTAL HIP ARTHROPLASTY ANTERIOR APPROACH;  Surgeon: [name], MD;  Location: [facility];  Service: Orthopedics;  Laterality: Left;    There were no vitals filed for this visit.  Subjective Assessment - 01/11/20 0935    Subjective  Had increased soreness in front of R thigh for the past 2 days. Hasn't looked into the [employer] yet.    Pertinent History  spinal cord CVA 01/2019, diabetes mellitus, HTN, PCOS, vitamin D deficiency, sp right (2011) and left (2017) hip replacement, peripheral edema,  venous insufficiency.    How long can you walk comfortably?  only household distances, states could not walk around track in therapy gym with cane    Diagnostic tests  MRI showed an abnormal focus in the conus medullaris and lower spinal cord adjacent to T12-L1.    Patient Stated Goals  "wants to be 99.99% better, wants to be the best she can be    Currently in Pain?  No/denies    Pain Onset  More than a month ago                Access Code: [email or web address] URL: [email or web address] Date: 01/08/2020 Prepared by: Janann August  Condensed and reviewed bolded exercises below during this session:   Exercises Standing Hip Abduction with Counter Support - 2 x daily - 7 x weekly - 10 reps - 2 sets - needed verbal and demo cues for proper technique  Standing Marching - 2 x daily - 7 x weekly - 10 reps - 2 sets Mini Squat - 1 x daily - 7 x weekly - 10 reps - 2 sets - cues for proper technique  Clamshell - 2 x daily - 7 x weekly - 10 reps - 2 sets Backward Walking with Counter Support -  2 x daily - 7 x weekly - 2 reps Tandem Walking with Counter Support - 2 x daily - 7 x weekly - 2 sets Side Stepping with Resistance at Thighs - 2 x daily - 7 x weekly - 2 sets - using red theraband  Single Leg Stance with Support - 2 x daily - 7 x weekly - 3 reps - 1 sets - 10 sec hold Alternating Heel Raises - 1 x daily - 5 x weekly - 10 reps - 2 sets         [facility] Adult PT Treatment/Exercise - 01/11/20 0001      Ambulation/Gait   Ambulation/Gait  Yes    Ambulation/Gait Assistance  5: Supervision    Ambulation/Gait Assistance Details  no AD between activities    Curb  5: Supervision   min guard   Curb Details (indicate cue type and reason)  x4 reps using SPC with aerobic step (pt initially reporting incr fear of performing without BUE support from a nearby car), then x4 reps on 6 inch curb with SPC, needing initial cues for proper sequencing and technique      Lumbar Exercises:  Aerobic   Other Aerobic Exercise  SciFit with BLE and BUE level 4.0 for 5 minutes for ROM, strengthening, endurance, HR pre 94 bpm, HR post 119 bpm              PT Education - 01/11/20 1105    Education Details  condensed HEP    Person(s) Educated  Patient    Methods  Explanation;Demonstration;Handout;Verbal cues    Comprehension  Verbalized understanding;Returned demonstration       PT Short Term Goals - 09/15/19 0949      PT SHORT TERM GOAL #1   Title  Patient will be independent with progression of HEP with focus on strength and balance in order to build upon functional gains in therapy. ALL STGs DUE 09/15/2019.    Time  4    Period  Weeks    Status  Partially Met    Target Date  09/15/19      PT SHORT TERM GOAL #2   Title  Patient will improve DGI to at least 19/24 for decreased fall risk.    Baseline  DGI 16/24 08/18/2019; DGI 18/24 09/11/2019    Time  4    Period  Weeks    Status  Partially Met    Target Date  09/15/19      PT SHORT TERM GOAL #3   Title  Pt will ambulate at least 550 ft in 3 minute walk test, with cane, for improved gait endurance and efficiency.    Baseline  507 ft in 3 minute walk 08/18/2019, 493 (2nd trial) in 3MWT with SPC on 09/15/19 - felt weakness in R hip    Time  4    Period  Weeks    Status  Not Met    Target Date  09/15/19      PT SHORT TERM GOAL #4   Title  Pt will ambulate household distances, 50-100 ft, no device with minimal to no R lateral hip sway, for improved return to independent mobility.    Baseline  minimal sway 09/11/2019; multiple bouts of 50-152ft in gym    Time  4    Period  Weeks    Status  Achieved    Target Date  09/15/19        PT Long Term Goals - 01/08/20 04076  PT LONG TERM GOAL #1   Title  Patient will be independent with final HEP with focus on strength and balance in order to build upon functional gains in therapy. ALL LTGs DUE 01/19/20    Baseline  10/10/19: met with current program, will need to be  updated as pt progresses and need to work toward community fitness plan    Time  5   written for 3 week POC - however 5 weeks due to delay in scheduling   Period  Weeks    Status  On-going      PT LONG TERM GOAL #2   Title  Pt will improve DGI score to at least 21/24 for decreased fall risk.    Time  5    Period  Weeks    Status  New      PT LONG TERM GOAL #3   Title  Pt will negotiate curb step and obstacle, no cane, independently, to demonstrate improved strength and balance for improved mobility.    Baseline  10/13/19: continues to need min guard to supervision with cues    Time  5    Period  Weeks    Status  On-going      PT LONG TERM GOAL #4   Title  Pt will undergo 6MWT with cane in order to improve functional mobility in the community and gait endurance.    Baseline  3MWT on 01/08/20 - 472'    Time  5    Period  Weeks    Status  On-going      PT LONG TERM GOAL #5   Title  Patient will ambulate at least 800'modified independently, over indoor level/outdoor unlevel surfaces using cane with step through gait pattern in order to improve community mobility.    Baseline  10/10/19: pt able to go 500 feet with min guard assist with cane    Time  5    Period  Weeks    Status  On-going            Plan - 01/11/20 1111    Clinical Impression Statement  Focus of today's skilled session was reviewing pt's prior HEP and condensing to include the most important exercises. Pt able to demo all correctly after needing initial verbal/demo cues for technique. Pt reports feeling much better after movement and will be calling the [employer] to try to get a membership set up today. Remainder of session focused on curb training with SLS with cues for proper sequencing. Will continue to progress towards LTGs.    Personal Factors and Comorbidities  Past/Current Experience;Comorbidity 3+    Comorbidities  spinal cord CVA 01/2019, diabetes mellitus, HTN, PCOS, vitamin D deficiency, sp right (2011) and left  (2017) hip replacement, peripheral edema, venous insufficiency.    Examination-Activity Limitations  Sit;Squat;Stairs;Stand;Transfers;Locomotion Level    Examination-Participation Restrictions  Meal Prep;Driving;Community Activity    Stability/Clinical Decision Making  Evolving/Moderate complexity    Rehab Potential  Good    PT Frequency  2x / week    PT Duration  3 weeks    PT Treatment/Interventions  ADLs/Self Care Home Management;DME Instruction;Gait training;Stair training;Functional mobility training;Therapeutic activities;Therapeutic exercise;Balance training;Patient/family education;Neuromuscular re-education;Energy conservation;Aquatic Therapy;Orthotic Fit/Training    PT Next Visit Plan  finalize HEP/community fitness program, continue to work on curb negotiation without device and SPC, balance reaction on compliant surfaces and LE strengthening wiht emphasis on right hip strengthening. gait outdoors    PT Home Exercise Plan  [email or web address] - plus seated hamstring  stretch and seated forward flexion low back stretch, supine hip flexor stretch    Consulted and Agree with Plan of Care  Patient       Patient will benefit from skilled therapeutic intervention in order to improve the following deficits and impairments:  Abnormal gait, Decreased activity tolerance, Decreased balance, Decreased endurance, Decreased coordination, Decreased mobility, Decreased range of motion, Difficulty walking, Decreased strength, Impaired sensation  Visit Diagnosis: Unsteadiness on feet  Muscle weakness (generalized)  Difficulty in walking, not elsewhere classified  Other abnormalities of gait and mobility     Problem List Patient Active Problem List   Diagnosis Date Noted  . Dysesthesia 03/29/2019  . Gait disturbance 03/29/2019  . Spinal cord infarction ([place]) 02/20/2019  . Cauda equina syndrome ([place]) 02/13/2019  . DM2 (diabetes mellitus, type 2) ([place]) 02/13/2019  . Syncope 02/13/2019  . Essential  hypertension 02/13/2019  . Class 3 severe obesity with serious comorbidity and body mass index (BMI) of 45.0 to 49.9 in adult ([place]) 10/04/2017  . Class 3 severe obesity without serious comorbidity with body mass index (BMI) of 45.0 to 49.9 in adult ([place]) 04/12/2017  . Vitamin D deficiency 04/12/2017  . Other fatigue 12/31/2016  . SOB (shortness of breath) on exertion 12/31/2016  . Type 2 diabetes mellitus without complication, without long-term current use of insulin ([place]) 12/31/2016  . Morbid obesity ([place]) 12/31/2016  . OA (osteoarthritis) of hip 12/02/2015    Arliss Journey, PT, DPT  01/11/2020, 11:16 AM  [facility] [address] [place], [place], [address] Phone: [phone]   Fax:  [phone]  Name: Laura Blackwell MRN: [id number] Date of Birth: 04-04-1956

## 2020-01-11 NOTE — Patient Instructions (Signed)
Access Code: [email or web address] URL: [email or web address] Date: 01/08/2020 Prepared by: Janann August  Exercises Standing Hip Abduction with Counter Support - 2 x daily - 7 x weekly - 5 reps - 3 sets Standing Marching - 2 x daily - 7 x weekly - 10 reps - 2 sets Mini Squat - 1 x daily - 7 x weekly - 10 reps - 2 sets Clamshell - 2 x daily - 7 x weekly - 10 reps - 2 sets Backward Walking with Counter Support - 2 x daily - 7 x weekly - 2 reps Tandem Walking with Counter Support - 2 x daily - 7 x weekly - 2 sets Side Stepping with Resistance at Thighs - 2 x daily - 7 x weekly - 2 sets Single Leg Stance with Support - 2 x daily - 7 x weekly - 3 reps - 1 sets - 10 sec hold Alternating Heel Raises - 1 x daily - 5 x weekly - 10 reps - 2 sets

## 2020-01-12 ENCOUNTER — Ambulatory Visit: Payer: 59 | Admitting: Physical Therapy

## 2020-01-16 ENCOUNTER — Ambulatory Visit: Payer: 59 | Admitting: Physical Therapy

## 2020-01-16 ENCOUNTER — Other Ambulatory Visit: Payer: Self-pay

## 2020-01-16 ENCOUNTER — Encounter: Payer: Self-pay | Admitting: Physical Therapy

## 2020-01-16 DIAGNOSIS — R262 Difficulty in walking, not elsewhere classified: Secondary | ICD-10-CM

## 2020-01-16 DIAGNOSIS — M6281 Muscle weakness (generalized): Secondary | ICD-10-CM

## 2020-01-16 DIAGNOSIS — R2681 Unsteadiness on feet: Secondary | ICD-10-CM

## 2020-01-16 DIAGNOSIS — R2689 Other abnormalities of gait and mobility: Secondary | ICD-10-CM

## 2020-01-16 NOTE — Therapy (Signed)
[facility] [address] [place], [place], [address] Phone: [phone]   Fax:  [phone]  Physical Therapy Treatment  Patient Details  Name: Laura Blackwell MRN: [id number] Date of Birth: 11-Jan-1956 Referring Provider (PT): Carol Ada, MD   Encounter Date: 01/16/2020  PT End of Session - 01/16/20 0807    Visit Number  60    Number of Visits  63   per re-cert 12/12/22   Date for PT Re-Evaluation  02/13/20    Authorization Type  U[employer]:  60 VL PT/OT/ST- follows caladar year    Authorization - Visit Number  45    Authorization - Number of Visits  60    PT Start Time  0802    PT Stop Time  0845    PT Time Calculation (min)  43 min    Equipment Utilized During Treatment  Gait belt    Activity Tolerance  Patient tolerated treatment well    Behavior During Therapy  WFL for tasks assessed/performed       Past Medical History:  Diagnosis Date  . Arthritis   . Diabetes mellitus without complication ([place])   . Edema    in left leg in 1980s on left ankle   . Hyperlipidemia   . Hypertension   . PCOS (polycystic ovarian syndrome)   . Superficial thrombophlebitis   . Swelling   . Venous insufficiency   . Vitamin D deficiency     Past Surgical History:  Procedure Laterality Date  . BREAST BIOPSY Right   . JOINT REPLACEMENT  2011    right hip   . left ankle surgery     . TOTAL HIP ARTHROPLASTY Left 12/02/2015   Procedure: LEFT TOTAL HIP ARTHROPLASTY ANTERIOR APPROACH;  Surgeon: [name], MD;  Location: [facility];  Service: Orthopedics;  Laterality: Left;    There were no vitals filed for this visit.  Subjective Assessment - 01/16/20 0805    Subjective  No new complains. No falls. Did join [employer], including water classes.    Pertinent History  spinal cord CVA 01/2019, diabetes mellitus, HTN, PCOS, vitamin D deficiency, sp right (2011) and left (2017) hip replacement, peripheral edema, venous insufficiency.    How  long can you walk comfortably?  only household distances, states could not walk around track in therapy gym with cane    Diagnostic tests  MRI showed an abnormal focus in the conus medullaris and lower spinal cord adjacent to T12-L1.    Patient Stated Goals  "wants to be 99.99% better, wants to be the best she can be    Currently in Pain?  No/denies    Pain Score  0-No pain            [facility] Adult PT Treatment/Exercise - 01/16/20 0808      Transfers   Transfers  Sit to Stand;Stand to Sit    Sit to Stand  6: Modified independent (Device/Increase time);With upper extremity assist;Without upper extremity assist;From chair/3-in-1;From bed    Stand to Sit  6: Modified independent (Device/Increase time);Without upper extremity assist;With upper extremity assist;To bed;To chair/3-in-1      Ambulation/Gait   Ambulation/Gait  Yes    Ambulation/Gait Assistance  5: Supervision    Ambulation/Gait Assistance Details  no balance issues noted. continues with antalgic gait pattern.    Ambulation Distance (Feet)  500 Feet   x1, plus around gym with session   Assistive device  Straight cane    Gait Pattern  Step-through pattern;Decreased stride length;Decreased stance time - left;Decreased step length - right;Decreased weight shift to left;Decreased trunk rotation;Narrow base of support;Lateral hip instability;Trendelenburg    Ambulation Surface  Level;Indoor;Unlevel;Outdoor;Paved    Curb  5: Supervision    Curb Details (indicate cue type and reason)  x1 with session outdoors      Neuro Re-ed    Neuro Re-ed Details   for strengthening/muscle re-ed: alternating forward step ups on BOUS (blue side up) for 10 reps each LE with contralateral LE marching, light UE support on bars; on inverted BOSU- single leg stance on center with contralateral LE fwd/lateral/back kicks for 2 sets of 5 reps each side with UE support on bars; in parallel bars with green band around LE's above knees- side stepping in squat  position left<>right, fwd/bwd diagonal stepping in squat position and with wide stance stretching the band with knees bent/squat position fwd/bwd walking- all of these for 3 laps each way with light UE support on bars for balance.          Balance Exercises - 01/16/20 0837      Balance Exercises: Standing   Balance Beam  standing across blue foam beam: alternating fwd stepping to floor/back onto beam, then alternating bwd stepping to floor/back onto beam for ~10 reps each with no UE support. cues on posture, base of support, weight shifting and increased step length/height.           PT Short Term Goals - 09/15/19 0949      PT SHORT TERM GOAL #1   Title  Patient will be independent with progression of HEP with focus on strength and balance in order to build upon functional gains in therapy. ALL STGs DUE 09/15/2019.    Time  4    Period  Weeks    Status  Partially Met    Target Date  09/15/19      PT SHORT TERM GOAL #2   Title  Patient will improve DGI to at least 19/24 for decreased fall risk.    Baseline  DGI 16/24 08/18/2019; DGI 18/24 09/11/2019    Time  4    Period  Weeks    Status  Partially Met    Target Date  09/15/19      PT SHORT TERM GOAL #3   Title  Pt will ambulate at least 550 ft in 3 minute walk test, with cane, for improved gait endurance and efficiency.    Baseline  507 ft in 3 minute walk 08/18/2019, 493 (2nd trial) in 3MWT with SPC on 09/15/19 - felt weakness in R hip    Time  4    Period  Weeks    Status  Not Met    Target Date  09/15/19      PT SHORT TERM GOAL #4   Title  Pt will ambulate household distances, 50-100 ft, no device with minimal to no R lateral hip sway, for improved return to independent mobility.    Baseline  minimal sway 09/11/2019; multiple bouts of 50-167ft in gym    Time  4    Period  Weeks    Status  Achieved    Target Date  09/15/19        PT Long Term Goals - 01/08/20 0818      PT LONG TERM GOAL #1   Title  Patient will  be independent with final HEP with focus on strength and balance in order to build upon functional gains in therapy.  ALL LTGs DUE 01/19/20    Baseline  10/10/19: met with current program, will need to be updated as pt progresses and need to work toward community fitness plan    Time  5   written for 3 week POC - however 5 weeks due to delay in scheduling   Period  Weeks    Status  On-going      PT LONG TERM GOAL #2   Title  Pt will improve DGI score to at least 21/24 for decreased fall risk.    Time  5    Period  Weeks    Status  New      PT LONG TERM GOAL #3   Title  Pt will negotiate curb step and obstacle, no cane, independently, to demonstrate improved strength and balance for improved mobility.    Baseline  10/13/19: continues to need min guard to supervision with cues    Time  5    Period  Weeks    Status  On-going      PT LONG TERM GOAL #4   Title  Pt will undergo 6MWT with cane in order to improve functional mobility in the community and gait endurance.    Baseline  3MWT on 01/08/20 - 472'    Time  5    Period  Weeks    Status  On-going      PT LONG TERM GOAL #5   Title  Patient will ambulate at least 800'modified independently, over indoor level/outdoor unlevel surfaces using cane with step through gait pattern in order to improve community mobility.    Baseline  10/10/19: pt able to go 500 feet with min guard assist with cane    Time  5    Period  Weeks    Status  On-going            Plan - 01/16/20 0807    Clinical Impression Statement  Today's skilled session continued to focus on gait on various surfaces/curbs with straight cane, LE strengthening and balance reactions. The pt is progressing toward goals and should benefit from continued PT to progress toward unmet goals.    Personal Factors and Comorbidities  Past/Current Experience;Comorbidity 3+    Comorbidities  spinal cord CVA 01/2019, diabetes mellitus, HTN, PCOS, vitamin D deficiency, sp right (2011) and left  (2017) hip replacement, peripheral edema, venous insufficiency.    Examination-Activity Limitations  Sit;Squat;Stairs;Stand;Transfers;Locomotion Level    Examination-Participation Restrictions  Meal Prep;Driving;Community Activity    Stability/Clinical Decision Making  Evolving/Moderate complexity    Rehab Potential  Good    PT Frequency  2x / week    PT Duration  3 weeks    PT Treatment/Interventions  ADLs/Self Care Home Management;DME Instruction;Gait training;Stair training;Functional mobility training;Therapeutic activities;Therapeutic exercise;Balance training;Patient/family education;Neuromuscular re-education;Energy conservation;Aquatic Therapy;Orthotic Fit/Training    PT Next Visit Plan  continue to work on curb negotiation without device and SPC, balance reaction on compliant surfaces and LE strengthening with emphasis on right hip strengthening. gait outdoors    PT Home Exercise Plan  [id number] - plus seated hamstring stretch and seated forward flexion low back stretch, supine hip flexor stretch    Consulted and Agree with Plan of Care  Patient       Patient will benefit from skilled therapeutic intervention in order to improve the following deficits and impairments:  Abnormal gait, Decreased activity tolerance, Decreased balance, Decreased endurance, Decreased coordination, Decreased mobility, Decreased range of motion, Difficulty walking, Decreased strength, Impaired sensation  Visit Diagnosis:  Unsteadiness on feet  Muscle weakness (generalized)  Difficulty in walking, not elsewhere classified  Other abnormalities of gait and mobility     Problem List Patient Active Problem List   Diagnosis Date Noted  . Dysesthesia 03/29/2019  . Gait disturbance 03/29/2019  . Spinal cord infarction ([place]) 02/20/2019  . Cauda equina syndrome ([place]) 02/13/2019  . DM2 (diabetes mellitus, type 2) ([place]) 02/13/2019  . Syncope 02/13/2019  . Essential hypertension 02/13/2019  . Class 3 severe  obesity with serious comorbidity and body mass index (BMI) of 45.0 to 49.9 in adult ([place]) 10/04/2017  . Class 3 severe obesity without serious comorbidity with body mass index (BMI) of 45.0 to 49.9 in adult ([place]) 04/12/2017  . Vitamin D deficiency 04/12/2017  . Other fatigue 12/31/2016  . SOB (shortness of breath) on exertion 12/31/2016  . Type 2 diabetes mellitus without complication, without long-term current use of insulin ([place]) 12/31/2016  . Morbid obesity ([place]) 12/31/2016  . OA (osteoarthritis) of hip 12/02/2015    Willow Ora, PTA, [facility] [address] [place], [place] [address] [phone] 01/16/20, 12:54 PM   Name: Laura Blackwell MRN: [id number] Date of Birth: [birth year]

## 2020-01-19 ENCOUNTER — Ambulatory Visit: Payer: 59 | Admitting: Physical Therapy

## 2020-01-19 ENCOUNTER — Encounter: Payer: Self-pay | Admitting: Physical Therapy

## 2020-01-19 ENCOUNTER — Other Ambulatory Visit: Payer: Self-pay

## 2020-01-19 DIAGNOSIS — R2681 Unsteadiness on feet: Secondary | ICD-10-CM

## 2020-01-19 DIAGNOSIS — R2689 Other abnormalities of gait and mobility: Secondary | ICD-10-CM

## 2020-01-19 DIAGNOSIS — M6281 Muscle weakness (generalized): Secondary | ICD-10-CM

## 2020-01-19 DIAGNOSIS — R262 Difficulty in walking, not elsewhere classified: Secondary | ICD-10-CM

## 2020-01-19 NOTE — Therapy (Signed)
[facility] [address] [place], [place], [address] Phone: [phone]   Fax:  [phone]  Physical Therapy Treatment  Patient Details  Name: Laura Blackwell MRN: [id number] Date of Birth: [birth year] Referring Provider (PT): Carol Ada, MD   Encounter Date: 01/19/2020  PT End of Session - 01/19/20 0806    Visit Number  61    Number of Visits  63   per re-cert 7/91/50   Date for PT Re-Evaluation  02/13/20    Authorization Type  UHC:  60 VL PT/OT/ST- follows cal[place] year    Authorization - Visit Number  92    Authorization - Number of Visits  60    PT Start Time  0801    PT Stop Time  0840    PT Time Calculation (min)  39 min    Equipment Utilized During Treatment  Gait belt    Activity Tolerance  Patient tolerated treatment well    Behavior During Therapy  WFL for tasks assessed/performed       Past Medical History:  Diagnosis Date  . Arthritis   . Diabetes mellitus without complication ([place])   . Edema    in left leg in 1980s on left ankle   . Hyperlipidemia   . Hypertension   . PCOS (polycystic ovarian syndrome)   . Superficial thrombophlebitis   . Swelling   . Venous insufficiency   . Vitamin D deficiency     Past Surgical History:  Procedure Laterality Date  . BREAST BIOPSY Right   . JOINT REPLACEMENT  2011    right hip   . left ankle surgery     . TOTAL HIP ARTHROPLASTY Left 12/02/2015   Procedure: LEFT TOTAL HIP ARTHROPLASTY ANTERIOR APPROACH;  Surgeon: [name], MD;  Location: [facility];  Service: Orthopedics;  Laterality: Left;    There were no vitals filed for this visit.  Subjective Assessment - 01/19/20 0805    Subjective  No new complaints. Did go to the [facility] and walked around the gym (they do not have a track at this location). Did 4 laps in 26 minutes with cane assistance.    Pertinent History  spinal cord CVA 01/2019, diabetes mellitus, HTN, PCOS, vitamin D deficiency, sp right  (2011) and left (2017) hip replacement, peripheral edema, venous insufficiency.    How long can you walk comfortably?  only household distances, states could not walk around track in therapy gym with cane    Diagnostic tests  MRI showed an abnormal focus in the conus medullaris and lower spinal cord adjacent to T12-L1.    Patient Stated Goals  "wants to be 99.99% better, wants to be the best she can be    Currently in Pain?  No/denies    Pain Score  0-No pain           [facility] Adult PT Treatment/Exercise - 01/19/20 0807      Transfers   Transfers  Sit to Stand;Stand to Sit    Sit to Stand  6: Modified independent (Device/Increase time);With upper extremity assist;Without upper extremity assist;From chair/3-in-1;From bed    Stand to Sit  6: Modified independent (Device/Increase time);Without upper extremity assist;With upper extremity assist;To bed;To chair/3-in-1      Ambulation/Gait   Ambulation/Gait  Yes    Ambulation/Gait Assistance  5: Supervision    Ambulation/Gait Assistance Details  no toe scuffing or balance issues noted with gait    Ambulation Distance (Feet)  500 Feet  x1, plus around gym with session   Assistive device  Straight cane    Gait Pattern  Step-through pattern;Decreased stride length;Decreased stance time - left;Decreased step length - right;Decreased weight shift to left;Decreased trunk rotation;Narrow base of support;Lateral hip instability;Trendelenburg    Ambulation Surface  Level;Indoor;Unlevel;Outdoor;Paved      High Level Balance   High Level Balance Activities  Side stepping;Marching forwards;Marching backwards;Tandem walking   tandem fwd/bwd   High Level Balance Comments  on red mat next to counter with intermittent touch for balance, 3 laps each/each way, min guard assist with cues on form/technique.      Knee/Hip Exercises: Machines for Strengthening   Cybex Leg Press  70# bil LE's x 15 reps with cues on full extension/flexion; right LE only 50# for  15 reps          Balance Exercises - 01/19/20 0840      Balance Exercises: Standing   Step Ups  Forward;UE support 2;Limitations    Step Ups Limitations  alternating fwd step ups onto BOSU with contralateral march for 10 reps each side with light UE support on bars.     Other Standing Exercises  on inverted BOSU: SLS on center with fwd/lateral/bwd kicks for 1 sets of 5 reps each way bil LE's. UE support on bars with cues on form and technique.           PT Short Term Goals - 09/15/19 0949      PT SHORT TERM GOAL #1   Title  Patient will be independent with progression of HEP with focus on strength and balance in order to build upon functional gains in therapy. ALL STGs DUE 09/15/2019.    Time  4    Period  Weeks    Status  Partially Met    Target Date  09/15/19      PT SHORT TERM GOAL #2   Title  Patient will improve DGI to at least 19/24 for decreased fall risk.    Baseline  DGI 16/24 08/18/2019; DGI 18/24 09/11/2019    Time  4    Period  Weeks    Status  Partially Met    Target Date  09/15/19      PT SHORT TERM GOAL #3   Title  Pt will ambulate at least 550 ft in 3 minute walk test, with cane, for improved gait endurance and efficiency.    Baseline  507 ft in 3 minute walk 08/18/2019, 493 (2nd trial) in 3MWT with SPC on 09/15/19 - felt weakness in R hip    Time  4    Period  Weeks    Status  Not Met    Target Date  09/15/19      PT SHORT TERM GOAL #4   Title  Pt will ambulate household distances, 50-100 ft, no device with minimal to no R lateral hip sway, for improved return to independent mobility.    Baseline  minimal sway 09/11/2019; multiple bouts of 50-159ft in gym    Time  4    Period  Weeks    Status  Achieved    Target Date  09/15/19        PT Long Term Goals - 01/08/20 0818      PT LONG TERM GOAL #1   Title  Patient will be independent with final HEP with focus on strength and balance in order to build upon functional gains in therapy. ALL LTGs DUE  01/19/20  Baseline  10/10/19: met with current program, will need to be updated as pt progresses and need to work toward community fitness plan    Time  5   written for 3 week POC - however 5 weeks due to delay in scheduling   Period  Weeks    Status  On-going      PT LONG TERM GOAL #2   Title  Pt will improve DGI score to at least 21/24 for decreased fall risk.    Time  5    Period  Weeks    Status  New      PT LONG TERM GOAL #3   Title  Pt will negotiate curb step and obstacle, no cane, independently, to demonstrate improved strength and balance for improved mobility.    Baseline  10/13/19: continues to need min guard to supervision with cues    Time  5    Period  Weeks    Status  On-going      PT LONG TERM GOAL #4   Title  Pt will undergo 6MWT with cane in order to improve functional mobility in the community and gait endurance.    Baseline  3MWT on 01/08/20 - 472'    Time  5    Period  Weeks    Status  On-going      PT LONG TERM GOAL #5   Title  Patient will ambulate at least 800'modified independently, over indoor level/outdoor unlevel surfaces using cane with step through gait pattern in order to improve community mobility.    Baseline  10/10/19: pt able to go 500 feet with min guard assist with cane    Time  5    Period  Weeks    Status  On-going            Plan - 01/19/20 0807    Clinical Impression Statement  Today's skilled session continued to focus on activity tolerance, curb negotiation with no AD and LE strengthening with rest breaks taken as needed. The pt is making steady progress toward goals and is on target to meet goals next week.    Personal Factors and Comorbidities  Past/Current Experience;Comorbidity 3+    Comorbidities  spinal cord CVA 01/2019, diabetes mellitus, HTN, PCOS, vitamin D deficiency, sp right (2011) and left (2017) hip replacement, peripheral edema, venous insufficiency.    Examination-Activity Limitations   Sit;Squat;Stairs;Stand;Transfers;Locomotion Level    Examination-Participation Restrictions  Meal Prep;Driving;Community Activity    Stability/Clinical Decision Making  Evolving/Moderate complexity    Rehab Potential  Good    PT Frequency  2x / week    PT Duration  3 weeks    PT Treatment/Interventions  ADLs/Self Care Home Management;DME Instruction;Gait training;Stair training;Functional mobility training;Therapeutic activities;Therapeutic exercise;Balance training;Patient/family education;Neuromuscular re-education;Energy conservation;Aquatic Therapy;Orthotic Fit/Training    PT Next Visit Plan  begin to finalize HEP and checK LTGs over pt's remaining 2 visits    PT Home Exercise Plan  [id number] - plus seated hamstring stretch and seated forward flexion low back stretch, supine hip flexor stretch    Consulted and Agree with Plan of Care  Patient       Patient will benefit from skilled therapeutic intervention in order to improve the following deficits and impairments:  Abnormal gait, Decreased activity tolerance, Decreased balance, Decreased endurance, Decreased coordination, Decreased mobility, Decreased range of motion, Difficulty walking, Decreased strength, Impaired sensation  Visit Diagnosis: Unsteadiness on feet  Muscle weakness (generalized)  Difficulty in walking, not elsewhere classified  Other abnormalities  of gait and mobility     Problem List Patient Active Problem List   Diagnosis Date Noted  . Dysesthesia 03/29/2019  . Gait disturbance 03/29/2019  . Spinal cord infarction ([place]) 02/20/2019  . Cauda equina syndrome ([place]) 02/13/2019  . DM2 (diabetes mellitus, type 2) ([place]) 02/13/2019  . Syncope 02/13/2019  . Essential hypertension 02/13/2019  . Class 3 severe obesity with serious comorbidity and body mass index (BMI) of 45.0 to 49.9 in adult ([place]) 10/04/2017  . Class 3 severe obesity without serious comorbidity with body mass index (BMI) of 45.0 to 49.9 in adult ([place])  04/12/2017  . Vitamin D deficiency 04/12/2017  . Other fatigue 12/31/2016  . SOB (shortness of breath) on exertion 12/31/2016  . Type 2 diabetes mellitus without complication, without long-term current use of insulin ([place]) 12/31/2016  . Morbid obesity ([place]) 12/31/2016  . OA (osteoarthritis) of hip 12/02/2015    Willow Ora, PTA, [facility] [address], [place], [place] [address] [phone] 01/19/20, 4:23 PM   Name: SHERMAINE RIVET MRN: [id number] Date of Birth: 07-16-1956

## 2020-01-23 ENCOUNTER — Other Ambulatory Visit: Payer: Self-pay

## 2020-01-23 ENCOUNTER — Ambulatory Visit: Payer: 59 | Admitting: Physical Therapy

## 2020-01-23 ENCOUNTER — Encounter: Payer: Self-pay | Admitting: Physical Therapy

## 2020-01-23 DIAGNOSIS — M6281 Muscle weakness (generalized): Secondary | ICD-10-CM

## 2020-01-23 DIAGNOSIS — R262 Difficulty in walking, not elsewhere classified: Secondary | ICD-10-CM

## 2020-01-23 DIAGNOSIS — R2689 Other abnormalities of gait and mobility: Secondary | ICD-10-CM

## 2020-01-23 DIAGNOSIS — R2681 Unsteadiness on feet: Secondary | ICD-10-CM | POA: Diagnosis not present

## 2020-01-23 NOTE — Therapy (Signed)
[facility] [address] [place], [place], [address] Phone: [phone]   Fax:  [phone]  Physical Therapy Treatment  Patient Details  Name: Laura Blackwell MRN: [id number] Date of Birth: [birth year] Referring Provider (PT): Carol Ada, MD   Encounter Date: 01/23/2020  PT End of Session - 01/23/20 1301    Visit Number  53    Number of Visits  63   per re-cert 02/10/23   Date for PT Re-Evaluation  02/13/20    Authorization Type  UHC:  60 VL PT/OT/ST- follows caladar year    Authorization - Visit Number  33    Authorization - Number of Visits  60    PT Start Time  1018    PT Stop Time  1100    PT Time Calculation (min)  42 min    Equipment Utilized During Treatment  Gait belt    Activity Tolerance  Patient tolerated treatment well    Behavior During Therapy  WFL for tasks assessed/performed       Past Medical History:  Diagnosis Date  . Arthritis   . Diabetes mellitus without complication ([place])   . Edema    in left leg in 1980s on left ankle   . Hyperlipidemia   . Hypertension   . PCOS (polycystic ovarian syndrome)   . Superficial thrombophlebitis   . Swelling   . Venous insufficiency   . Vitamin D deficiency     Past Surgical History:  Procedure Laterality Date  . BREAST BIOPSY Right   . JOINT REPLACEMENT  2011    right hip   . left ankle surgery     . TOTAL HIP ARTHROPLASTY Left 12/02/2015   Procedure: LEFT TOTAL HIP ARTHROPLASTY ANTERIOR APPROACH;  Surgeon: [name], MD;  Location: [facility];  Service: Orthopedics;  Laterality: Left;    There were no vitals filed for this visit.  Subjective Assessment - 01/23/20 1020    Subjective  Is having increased lower back pain. Has slept with the pillow in between her legs ever since she was instructed to last session. At the beginning of June, starts the water aerobics 3 times a week. On Tuesday and Thursdays - is going to walk and do her exercises  or the bike.    Pertinent History  spinal cord CVA 01/2019, diabetes mellitus, HTN, PCOS, vitamin D deficiency, sp right (2011) and left (2017) hip replacement, peripheral edema, venous insufficiency.    How long can you walk comfortably?  only household distances, states could not walk around track in therapy gym with cane    Diagnostic tests  MRI showed an abnormal focus in the conus medullaris and lower spinal cord adjacent to T12-L1.    Patient Stated Goals  "wants to be 99.99% better, wants to be the best she can be    Currently in Pain?  Yes    Pain Score  8    7.5/8   Pain Location  Back    Pain Orientation  Mid;Lower    Pain Descriptors / Indicators  Aching    Pain Type  Acute pain    Aggravating Factors   bending over    Pain Relieving Factors  hasn't tried heat or ice yet - just trying to sleep with pillow between the leg.                        [facility] Adult PT Treatment/Exercise - 01/23/20 0001  Ambulation/Gait   Ambulation/Gait  Yes    Ambulation/Gait Assistance  5: Supervision    Ambulation/Gait Assistance Details  no AD throughout session    Assistive device  None    Gait Pattern  Step-through pattern;Decreased stride length;Decreased stance time - left;Decreased step length - right;Decreased weight shift to left;Decreased trunk rotation;Narrow base of support;Lateral hip instability;Trendelenburg    Ambulation Surface  Indoor;Level    Stairs  Yes    Stairs Assistance  4: Min guard    Stair Management Technique  One rail Right;Alternating pattern;Forwards    Number of Stairs  8    Height of Stairs  4    Curb  5: Supervision    Curb Details (indicate cue type and reason)  x3 reps with pt ascending with LLE and descending with RLE (pt turns sideways slightly while descending)      Neuro Re-ed    Neuro Re-ed Details   Standing on red compliant mat: alternating stepping over three 2" obstacles with UE support and fingertip support down and back 5 reps  with cues for reciprocal pattern and increased foot clearance. Lateral stepping over 2" black foam beam: x10 reps B, beginning with BUE support and progressing to a couple reps with no UE support with close min guard, cues for weight shift.       Exercises   Exercises  Other Exercises    Other Exercises   Supine knee to chest stretch: 3 x 30 seconds B, lower trunk rotations x10 reps with a couple second hold for low back stretch - pt reporting decr in low back pain afterwards, added to HEP       Knee/Hip Exercises: Standing   Lateral Step Up  Both;1 set;10 reps;Hand Hold: 2;Hand Hold: 1;Step Height: 6"    Lateral Step Up Limitations  pt with incr difficulty performing on RLE    Other Standing Knee Exercises  standing at bottom of stair case: Forward step ups on 6" step with single UE support x5 reps B, and then trying 3 reps with LLE with no UE support, 3 reps RLE with fingertip support - pt with incr difficulty with RLE                PT Short Term Goals - 09/15/19 0949      PT SHORT TERM GOAL #1   Title  Patient will be independent with progression of HEP with focus on strength and balance in order to build upon functional gains in therapy. ALL STGs DUE 09/15/2019.    Time  4    Period  Weeks    Status  Partially Met    Target Date  09/15/19      PT SHORT TERM GOAL #2   Title  Patient will improve DGI to at least 19/24 for decreased fall risk.    Baseline  DGI 16/24 08/18/2019; DGI 18/24 09/11/2019    Time  4    Period  Weeks    Status  Partially Met    Target Date  09/15/19      PT SHORT TERM GOAL #3   Title  Pt will ambulate at least 550 ft in 3 minute walk test, with cane, for improved gait endurance and efficiency.    Baseline  507 ft in 3 minute walk 08/18/2019, 493 (2nd trial) in 3MWT with SPC on 09/15/19 - felt weakness in R hip    Time  4    Period  Weeks    Status  Not Met    Target Date  09/15/19      PT SHORT TERM GOAL #4   Title  Pt will ambulate household  distances, 50-100 ft, no device with minimal to no R lateral hip sway, for improved return to independent mobility.    Baseline  minimal sway 09/11/2019; multiple bouts of 50-122ft in gym    Time  4    Period  Weeks    Status  Achieved    Target Date  09/15/19        PT Long Term Goals - 01/08/20 0818      PT LONG TERM GOAL #1   Title  Patient will be independent with final HEP with focus on strength and balance in order to build upon functional gains in therapy. ALL LTGs DUE 01/19/20    Baseline  10/10/19: met with current program, will need to be updated as pt progresses and need to work toward community fitness plan    Time  5   written for 3 week POC - however 5 weeks due to delay in scheduling   Period  Weeks    Status  On-going      PT LONG TERM GOAL #2   Title  Pt will improve DGI score to at least 21/24 for decreased fall risk.    Time  5    Period  Weeks    Status  New      PT LONG TERM GOAL #3   Title  Pt will negotiate curb step and obstacle, no cane, independently, to demonstrate improved strength and balance for improved mobility.    Baseline  10/13/19: continues to need min guard to supervision with cues    Time  5    Period  Weeks    Status  On-going      PT LONG TERM GOAL #4   Title  Pt will undergo 6MWT with cane in order to improve functional mobility in the community and gait endurance.    Baseline  3MWT on 01/08/20 - 472'    Time  5    Period  Weeks    Status  On-going      PT LONG TERM GOAL #5   Title  Patient will ambulate at least 800'modified independently, over indoor level/outdoor unlevel surfaces using cane with step through gait pattern in order to improve community mobility.    Baseline  10/10/19: pt able to go 500 feet with min guard assist with cane    Time  5    Period  Weeks    Status  On-going            Plan - 01/23/20 1302    Clinical Impression Statement  Pt reporting increased midline low back pain today. Educated and had pt  perform supine single knee to chest stretch and lower trunk rotations with pt reporting a decr in pain afterwards and throughout session - provided as HEP to perform daily. Remainder of session focused on  BLE strengthening, balance strategies, and curb/stair training. Seated rest breaks taken as needed. Pt to be discharged at next session with pt in agreement and has community fitness plan in [facility]    Personal Factors and Comorbidities  Past/Current Experience;Comorbidity 3+    Comorbidities  spinal cord CVA 01/2019, diabetes mellitus, HTN, PCOS, vitamin D deficiency, sp right (2011) and left (2017) hip replacement, peripheral edema, venous insufficiency.    Examination-Activity Limitations  Sit;Squat;Stairs;Stand;Transfers;Locomotion Level  Examination-Participation Restrictions  Meal Prep;Driving;Community Activity    Stability/Clinical Decision Making  Evolving/Moderate complexity    Rehab Potential  Good    PT Frequency  2x / week    PT Duration  3 weeks    PT Treatment/Interventions  ADLs/Self Care Home Management;DME Instruction;Gait training;Stair training;Functional mobility training;Therapeutic activities;Therapeutic exercise;Balance training;Patient/family education;Neuromuscular re-education;Energy conservation;Aquatic Therapy;Orthotic Fit/Training    PT Next Visit Plan  begin to finalize HEP and checK LTGs over pt's remaining 2 visits    PT Home Exercise Plan  [email or web address] - plus seated hamstring stretch and seated forward flexion low back stretch, supine hip flexor stretch    Consulted and Agree with Plan of Care  Patient       Patient will benefit from skilled therapeutic intervention in order to improve the following deficits and impairments:  Abnormal gait, Decreased activity tolerance, Decreased balance, Decreased endurance, Decreased coordination, Decreased mobility, Decreased range of motion, Difficulty walking, Decreased strength, Impaired sensation  Visit  Diagnosis: Unsteadiness on feet  Muscle weakness (generalized)  Difficulty in walking, not elsewhere classified  Other abnormalities of gait and mobility     Problem List Patient Active Problem List   Diagnosis Date Noted  . Dysesthesia 03/29/2019  . Gait disturbance 03/29/2019  . Spinal cord infarction ([place]) 02/20/2019  . Cauda equina syndrome ([place]) 02/13/2019  . DM2 (diabetes mellitus, type 2) ([place]) 02/13/2019  . Syncope 02/13/2019  . Essential hypertension 02/13/2019  . Class 3 severe obesity with serious comorbidity and body mass index (BMI) of 45.0 to 49.9 in adult ([place]) 10/04/2017  . Class 3 severe obesity without serious comorbidity with body mass index (BMI) of 45.0 to 49.9 in adult ([place]) 04/12/2017  . Vitamin D deficiency 04/12/2017  . Other fatigue 12/31/2016  . SOB (shortness of breath) on exertion 12/31/2016  . Type 2 diabetes mellitus without complication, without long-term current use of insulin ([place]) 12/31/2016  . Morbid obesity ([place]) 12/31/2016  . OA (osteoarthritis) of hip 12/02/2015    Arliss Journey, PT, DPT  01/23/2020, 1:12 PM  [facility] [address] [place], [place], [address] Phone: [phone]   Fax:  [phone]  Name: VAIDA KERCHNER MRN: [id number] Date of Birth: Jan 08, 1956

## 2020-01-23 NOTE — Patient Instructions (Signed)
Access Code: [email or web address] URL: [email or web address] Date: 01/08/2020 Prepared by: Janann August  Exercises Standing Hip Abduction with Counter Support - 2 x daily - 7 x weekly - 5 reps - 3 sets Standing Marching - 2 x daily - 7 x weekly - 10 reps - 2 sets Mini Squat - 1 x daily - 7 x weekly - 10 reps - 2 sets Clamshell - 2 x daily - 7 x weekly - 10 reps - 2 sets Backward Walking with Counter Support - 2 x daily - 7 x weekly - 2 reps Tandem Walking with Counter Support - 2 x daily - 7 x weekly - 2 sets Side Stepping with Resistance at Thighs - 2 x daily - 7 x weekly - 2 sets Single Leg Stance with Support - 2 x daily - 7 x weekly - 3 reps - 1 sets - 10 sec hold Alternating Heel Raises - 1 x daily - 5 x weekly - 10 reps - 2 sets  New additions to HEP:  Hooklying Single Knee to Chest Stretch - 2 x daily - 7 x weekly - 3 sets - 30 hold Lower Trunk Rotations - 2 x daily - 7 x weekly - 1 sets - 10 reps

## 2020-01-26 ENCOUNTER — Other Ambulatory Visit: Payer: Self-pay

## 2020-01-26 ENCOUNTER — Ambulatory Visit: Payer: 59 | Admitting: Physical Therapy

## 2020-01-26 DIAGNOSIS — R2689 Other abnormalities of gait and mobility: Secondary | ICD-10-CM

## 2020-01-26 DIAGNOSIS — R2681 Unsteadiness on feet: Secondary | ICD-10-CM | POA: Diagnosis not present

## 2020-01-26 DIAGNOSIS — R29818 Other symptoms and signs involving the nervous system: Secondary | ICD-10-CM

## 2020-01-26 DIAGNOSIS — R262 Difficulty in walking, not elsewhere classified: Secondary | ICD-10-CM

## 2020-01-26 DIAGNOSIS — M6281 Muscle weakness (generalized): Secondary | ICD-10-CM

## 2020-01-26 NOTE — Therapy (Signed)
[facility] [address] [place], [place], [address] Phone: [phone]   Fax:  [phone]  Physical Therapy Treatment/Discharge Summary  Patient Details  Name: Laura Blackwell MRN: [id number] Date of Birth: [birth year] Referring Provider (PT): Carol Ada, MD   Encounter Date: 01/26/2020  PT End of Session - 01/26/20 1121    Visit Number  55    Number of Visits  101   per re-cert 8/37/29   Date for PT Re-Evaluation  02/13/20    Authorization Type  UHC:  60 VL PT/OT/ST- follows calad[place] year    Authorization - Visit Number  78    Authorization - Number of Visits  60    PT Start Time  0800    PT Stop Time  0845    PT Time Calculation (min)  45 min    Equipment Utilized During Treatment  Gait belt    Activity Tolerance  Patient tolerated treatment well    Behavior During Therapy  WFL for tasks assessed/performed       Past Medical History:  Diagnosis Date  . Arthritis   . Diabetes mellitus without complication ([place])   . Edema    in left leg in 1980s on left ankle   . Hyperlipidemia   . Hypertension   . PCOS (polycystic ovarian syndrome)   . Superficial thrombophlebitis   . Swelling   . Venous insufficiency   . Vitamin D deficiency     Past Surgical History:  Procedure Laterality Date  . BREAST BIOPSY Right   . JOINT REPLACEMENT  2011    right hip   . left ankle surgery     . TOTAL HIP ARTHROPLASTY Left 12/02/2015   Procedure: LEFT TOTAL HIP ARTHROPLASTY ANTERIOR APPROACH;  Surgeon: [name], MD;  Location: [facility];  Service: Orthopedics;  Laterality: Left;    There were no vitals filed for this visit.  Subjective Assessment - 01/26/20 0802    Subjective  Has been doing some exercise machines at the [facility] and things are going well. Back is feeling much better.    Pertinent History  spinal cord CVA 01/2019, diabetes mellitus, HTN, PCOS, vitamin D deficiency, sp right (2011) and left (2017) hip  replacement, peripheral edema, venous insufficiency.    How long can you walk comfortably?  only household distances, states could not walk around track in therapy gym with cane    Diagnostic tests  MRI showed an abnormal focus in the conus medullaris and lower spinal cord adjacent to T12-L1.    Patient Stated Goals  "wants to be 99.99% better, wants to be the best she can be    Currently in Pain?  No/denies         [facility] PT Assessment - 01/26/20 0826      Ambulation/Gait   Ambulation/Gait Assistance  5: Supervision;4: Min guard;6: Modified independent (Device/Increase time)    Ambulation/Gait Assistance Details  min guard assist with gait outdoors when performing curb, supervision with no AD indoors on level surfaces. mod I with SPC over indoor level and outdoor surfaces    Assistive device  None;Straight cane    Curb Details (indicate cue type and reason)  x3 reps and then x2 reps with walking forward with momentum with no AD       6 Minute Walk- Baseline   HR (bpm)  91    02 Sat (%RA)  97 %    Modified Borg Scale for Dyspnea  0- Nothing at  all      6 Minute walk- Post Test   HR (bpm)  125    02 Sat (%RA)  95 %    Modified Borg Scale for Dyspnea  7- Severe shortness of breath or very hard breathing    Perceived Rate of Exertion (Borg)  15- Hard      6 minute walk test results    Aerobic Endurance Distance Walked  769    Endurance additional comments  5MWT with SPC      Dynamic Gait Index   Level Surface  Mild Impairment    Change in Gait Speed  Mild Impairment    Gait with Horizontal Head Turns  Normal    Gait with Vertical Head Turns  Normal    Gait and Pivot Turn  Mild Impairment    Step Over Obstacle  Moderate Impairment    Step Around Obstacles  Normal    Steps  Mild Impairment    Total Score  18    DGI comment:  18/24                    [facility] Adult PT Treatment/Exercise - 01/26/20 9937      Ambulation/Gait   Ambulation/Gait  Yes    Gait Pattern   Step-through pattern;Decreased stride length;Decreased stance time - left;Decreased step length - right;Decreased weight shift to left;Decreased trunk rotation;Narrow base of support;Lateral hip instability;Trendelenburg    Ambulation Surface  Level;Indoor;Unlevel;Outdoor;Paved    Stairs  Yes    Stairs Assistance  4: Min guard;5: Supervision    Stair Management Technique  Alternating pattern;Two rails;Forwards    Number of Stairs  8    Height of Stairs  4    Curb  5: Supervision      Therapeutic Activites    Therapeutic Activities  Other Therapeutic Activities    Other Therapeutic Activities  discussed with pt post-discharge community fitness plan - pt reporting going to the [facility] and doing water aerobics, utilizing aerobic and strengthening equipment, walking around track, and performing HEP consistently                PT Short Term Goals - 09/15/19 0949      PT SHORT TERM GOAL #1   Title  Patient will be independent with progression of HEP with focus on strength and balance in order to build upon functional gains in therapy. ALL STGs DUE 09/15/2019.    Time  4    Period  Weeks    Status  Partially Met    Target Date  09/15/19      PT SHORT TERM GOAL #2   Title  Patient will improve DGI to at least 19/24 for decreased fall risk.    Baseline  DGI 16/24 08/18/2019; DGI 18/24 09/11/2019    Time  4    Period  Weeks    Status  Partially Met    Target Date  09/15/19      PT SHORT TERM GOAL #3   Title  Pt will ambulate at least 550 ft in 3 minute walk test, with cane, for improved gait endurance and efficiency.    Baseline  507 ft in 3 minute walk 08/18/2019, 493 (2nd trial) in 3MWT with SPC on 09/15/19 - felt weakness in R hip    Time  4    Period  Weeks    Status  Not Met    Target Date  09/15/19      PT SHORT TERM  GOAL #4   Title  Pt will ambulate household distances, 50-100 ft, no device with minimal to no R lateral hip sway, for improved return to independent mobility.     Baseline  minimal sway 09/11/2019; multiple bouts of 50-143ft in gym    Time  4    Period  Weeks    Status  Achieved    Target Date  09/15/19        PT Long Term Goals - 01/26/20 0805      PT LONG TERM GOAL #1   Title  Patient will be independent with final HEP with focus on strength and balance in order to build upon functional gains in therapy. ALL LTGs DUE 01/19/20    Baseline  feels good about final HEP - [facility]    Time  5   written for 3 week POC - however 5 weeks due to delay in scheduling   Period  Weeks    Status  Achieved      PT LONG TERM GOAL #2   Title  Pt will improve DGI score to at least 21/24 for decreased fall risk.    Baseline  18/24 with no AD    Time  5    Period  Weeks    Status  Not Met      PT LONG TERM GOAL #3   Title  Pt will negotiate curb step and obstacle, no cane, independently, to demonstrate improved strength and balance for improved mobility.    Baseline  performs with no AD with supervision    Time  5    Period  Weeks    Status  Partially Met      PT LONG TERM GOAL #4   Title  Pt will undergo 6MWT with cane in order to improve functional mobility in the community and gait endurance.    Baseline  performed 5MWT with SPC    Time  5    Period  Weeks    Status  Partially Met      PT LONG TERM GOAL #5   Title  Patient will ambulate at least 800'modified independently, over indoor level/outdoor unlevel surfaces using cane with step through gait pattern in order to improve community mobility.    Baseline  800' mod Ievel indoor surfaces using cane and outdoors, min guard outdoors with no AD    Time  5    Period  Weeks    Status  Achieved           PHYSICAL THERAPY DISCHARGE SUMMARY  Visits from Start of Care: 625 Current functional level related to goals / functional outcomes: See LTGs.   Remaining deficits: Impaired balance, decr BLE strength, decr endurance, gait abnormalities.     Education / Equipment: HEP  Plan: Patient agrees to discharge.  Patient goals were partially met. Patient is being discharged due to meeting the stated rehab goals.  ?????        Plan - 01/26/20 1132    Clinical Impression Statement  Focus of today's skilled session was assessing LTGs for D/C. Pt has met 2 out of 5 LTGs in regards to ambulating mod I with SPC and being independent with HEP. Pt partially met LTGs #3 and #4 - able to perform a curb with no AD with supervision and pt able to perform 5MWT today with SPC. Pt scored an 18/24 on the DGI today with no AD indicating pt is  at a higher risk for falls. Pt is very pleased with her progress with therapy and verbalizes understanding of community fitness plan at the [facility] post D/C. At this time pt will be discharged from PT.    Personal Factors and Comorbidities  Past/Current Experience;Comorbidity 3+    Comorbidities  spinal cord CVA 01/2019, diabetes mellitus, HTN, PCOS, vitamin D deficiency, sp right (2011) and left (2017) hip replacement, peripheral edema, venous insufficiency.    Examination-Activity Limitations  Sit;Squat;Stairs;Stand;Transfers;Locomotion Level    Examination-Participation Restrictions  Meal Prep;Driving;Community Activity    Stability/Clinical Decision Making  Evolving/Moderate complexity    Rehab Potential  Good    PT Frequency  2x / week    PT Duration  3 weeks    PT Treatment/Interventions  ADLs/Self Care Home Management;DME Instruction;Gait training;Stair training;Functional mobility training;Therapeutic activities;Therapeutic exercise;Balance training;Patient/family education;Neuromuscular re-education;Energy conservation;Aquatic Therapy;Orthotic Fit/Training    PT Next Visit Plan  D/C    PT Home Exercise Plan  [email or web address] - plus seated hamstring stretch and seated forward flexion low back stretch, supine hip flexor stretch    Consulted and Agree with Plan of Care  Patient       Patient will benefit from skilled  therapeutic intervention in order to improve the following deficits and impairments:  Abnormal gait, Decreased activity tolerance, Decreased balance, Decreased endurance, Decreased coordination, Decreased mobility, Decreased range of motion, Difficulty walking, Decreased strength, Impaired sensation  Visit Diagnosis: Unsteadiness on feet  Difficulty in walking, not elsewhere classified  Muscle weakness (generalized)  Other abnormalities of gait and mobility  Other symptoms and signs involving the nervous system     Problem List Patient Active Problem List   Diagnosis Date Noted  . Dysesthesia 03/29/2019  . Gait disturbance 03/29/2019  . Spinal cord infarction ([place]) 02/20/2019  . Cauda equina syndrome ([place]) 02/13/2019  . DM2 (diabetes mellitus, type 2) ([place]) 02/13/2019  . Syncope 02/13/2019  . Essential hypertension 02/13/2019  . Class 3 severe obesity with serious comorbidity and body mass index (BMI) of 45.0 to 49.9 in adult ([place]) 10/04/2017  . Class 3 severe obesity without serious comorbidity with body mass index (BMI) of 45.0 to 49.9 in adult ([place]) 04/12/2017  . Vitamin D deficiency 04/12/2017  . Other fatigue 12/31/2016  . SOB (shortness of breath) on exertion 12/31/2016  . Type 2 diabetes mellitus without complication, without long-term current use of insulin ([place]) 12/31/2016  . Morbid obesity ([place]) 12/31/2016  . OA (osteoarthritis) of hip 12/02/2015    Arliss Journey, PT, DPT  01/26/2020, 11:32 AM  [facility] [address] [place], [place], [address] Phone: [phone]   Fax:  [phone]  Name: Laura Blackwell MRN: [id number] Date of Birth: December 02, 1955

## 2020-02-12 ENCOUNTER — Other Ambulatory Visit: Payer: Self-pay | Admitting: Family Medicine

## 2020-02-12 ENCOUNTER — Other Ambulatory Visit (HOSPITAL_COMMUNITY)
Admission: RE | Admit: 2020-02-12 | Discharge: 2020-02-12 | Disposition: A | Discharge status: Home / Self Care | Payer: 59 | Source: Ambulatory Visit | Attending: Family Medicine | Admitting: Family Medicine

## 2020-02-12 DIAGNOSIS — Z124 Encounter for screening for malignant neoplasm of cervix: Secondary | ICD-10-CM | POA: Insufficient documentation

## 2020-02-14 LAB — CYTOLOGY - PAP
Comment: NEGATIVE
Diagnosis: NEGATIVE
High risk HPV: NEGATIVE

## 2020-04-01 ENCOUNTER — Ambulatory Visit: Payer: 59 | Admitting: Neurology

## 2020-04-01 ENCOUNTER — Encounter: Payer: Self-pay | Admitting: Neurology

## 2020-04-01 VITALS — BP 147/95 | HR 91 | Ht 67.0 in | Wt 291.0 lb

## 2020-04-01 DIAGNOSIS — R208 Other disturbances of skin sensation: Secondary | ICD-10-CM | POA: Diagnosis not present

## 2020-04-01 DIAGNOSIS — G959 Disease of spinal cord, unspecified: Secondary | ICD-10-CM | POA: Diagnosis not present

## 2020-04-01 DIAGNOSIS — R200 Anesthesia of skin: Secondary | ICD-10-CM | POA: Insufficient documentation

## 2020-04-01 DIAGNOSIS — E119 Type 2 diabetes mellitus without complications: Secondary | ICD-10-CM

## 2020-04-01 MED ORDER — PREGABALIN 100 MG PO CAPS: 100.0000 mg | ORAL_CAPSULE | Freq: Two times a day (BID) | ORAL | 5 refills | Status: DC

## 2020-04-01 MED ORDER — ALPRAZOLAM 0.5 MG PO TABS: ORAL_TABLET | ORAL | 0 refills | Status: DC

## 2020-04-01 NOTE — Progress Notes (Signed)
[facility]  PATIENT: Laura Blackwell DOB: 1955/11/16  REFERRING DOCTOR OR PCP: Carol Ada, MD SOURCE: Patient, Notes from Dr. Tamala Julian, imaging and lab reports, multiple MRIs of the spine and brain personally reviewed  _________________________________   HISTORICAL  CHIEF COMPLAINT:  Chief Complaint  Patient presents with  . Follow-up    RM 12, alone. Last seen 10/03/19. Last fall 10/719. Denies hitting head, she did fracture right elbow. She was placed in cast, no surgery preformed.   . Gait Problem    Ambulates with cane. She has been trying to walk without cane sometimes.     HISTORY OF PRESENT ILLNESS:  Laura Blackwell is a 64 year old woman with a spinal cord lesion.  Update 04/01/2020: She has abnormal sensation with a burning sensation and cold sensation in her left buttock down to her left leg. Initially, her right side was worse but it has improved.  She continues to have near-total numbness in the perineum, though she feels there was some recovery on the right.  She notes the right side had more dysesthesias but now they are milder.   She takes gabapentin 300 mg bid (skips pm dose as she does not like taking med's).    Of note, she is able to feel more in her legs and has less numbness on the right and very slightly less numbness on the left.    She has edema in her left leg since an operation many years ago.  She usually wears compression stockings (did not today due to exam).    She can walk without a cane but does better with it.   She has bowel and urinary urgency.  However, she rarely has incontinence now..   She is on hyoscyamine.   From initial consult 03/29/2019: Laura Blackwell is a 64 year old woman who had the onset of lower back pain and leg weakness 02/10/2019. She was at work, when she stood up she fell flat on her face.    She did not get better over the next 2 days and also had urinary incontinence and presented to the ED  02/13/19.  She was noted to have no sensation in the perineum and sacral area.  She had incontinence of bowel and bladder. She had some left leg weakness.    She was admitted and MRI showed an abnormal focus in the conus medullaris and lower spinal cord adjacent to T12-L1.  This was felt to be a demyelinating lesion versus stroke and she received 5 days of high-dose IV steroids.  While in the hospital she had a lumbar puncture.  Oligoclonal bands were not present.  She notes improving since she received the steroids.   She is now able to walk some steps without a cane but uses a walker due to poor balance.   In PT she practiced using a cane.   She still has numbness and also feels a cold sensation in her legs/groin/buttocks.    She never had any numbness or weakness in the arms or chest/abdomen.   She notes some improvement in sensation but still has near total numbness in the groin/buttocks.   She now is starting to get a signal that she needs to use the bathroom.   She has not had incontinence the last few weeks.     She does not recall any infections in th presenting few weeks and had no vaccinations.   n She has had DM x 7 years and has been on insulin x  1 year.   She denies any numbness in her feet or eye or kidney issues related to DM   The following MRIs and CTs were reviewed: 02/13/2019: MRI of the lumbar spine showed abnormal signal in the conus medullaris.  There is severe facet hypertrophy with 5 mm of anterolisthesis and moderately severe spinal stenosis at L4-L5.  Mild degenerative changes at the other levels. 02/14/2019: MRI of the thoracic (with and without) and lumbar spine (with contrast)  There is a lesion of the conus medullaris and distal thoracic spinal cord.  It does not enhance.   02/16/2019: MRI of the brain and cervical spine.  The brain was normal for age.  The spinal cord was normal.  There was some degenerative changes noted at the C5-C6 with left foraminal narrowing. 02/18/2019:  CT angiogram of the head and neck were normal.  NMO-IgG Ab was negative.   HIV negative.   ESR mildly elevated at 32.  CSF showed no oligoclonal bands.   CSF proteins and glucose mildly elevated.  7 WBC.   CSF VDRL negative.      REVIEW OF SYSTEMS: Constitutional: No fevers, chills, sweats, or change in appetite Eyes: No visual changes, double vision, eye pain Ear, nose and throat: No hearing loss, ear pain, nasal congestion, sore throat Cardiovascular: No chest pain, palpitations Respiratory: No shortness of breath at rest or with exertion.   No wheezes GastrointestinaI: No nausea, vomiting, diarrhea.  She has fecal urgency  genitourinary: No dysuria, urinary retention.  Urgency as above  musculoskeletal: No neck pain, back pain Integumentary: No rash, pruritus, skin lesions Neurological: as above Psychiatric: No depression at this time.  No anxiety Endocrine: No palpitations, diaphoresis, change in appetite, change in weigh or increased thirst.  She has insulin-dependent type 2 diabetes. Hematologic/Lymphatic: No anemia, purpura, petechiae. Allergic/Immunologic: No itchy/runny eyes, nasal congestion, recent allergic reactions, rashes  ALLERGIES: No Known Allergies  HOME MEDICATIONS:  Current Outpatient Medications:  .  amLODipine (NORVASC) 5 MG tablet, Take 5 mg by mouth daily. , Disp: , Rfl:  .  aspirin EC 81 MG tablet, Take 81 mg by mouth daily., Disp: , Rfl:  .  atorvastatin (LIPITOR) 20 MG tablet, Take 20 mg by mouth daily. , Disp: , Rfl:  .  Cholecalciferol 125 MCG (5000 UT) capsule, Take 5,000 Units by mouth every Monday, Wednesday, and Friday., Disp: , Rfl:  .  Cyanocobalamin (VITAMIN B-12 PO), Take 1 capsule by mouth every Monday, Wednesday, and Friday., Disp: , Rfl:  .  furosemide (LASIX) 20 MG tablet, Take 20 mg by mouth 2 (two) times daily. , Disp: , Rfl:  .  gabapentin (NEURONTIN) 300 MG capsule, One po qAm, one po q evening and one po qHS, Disp: 90 capsule, Rfl:  11 .  glucose blood (ONETOUCH VERIO) test strip, 1 each by Other route 2 (two) times daily. Use as instructed, Disp: , Rfl:  .  hyoscyamine (LEVBID) 0.375 MG 12 hr tablet, Take 1 tablet (0.375 mg total) by mouth 2 (two) times daily., Disp: 60 tablet, Rfl: 5 .  Insulin Pen Needle (BD PEN NEEDLE NANO U/F) 32G X 4 MM MISC, 1 each by Does not apply route daily., Disp: 100 each, Rfl: 0 .  losartan (COZAAR) 100 MG tablet, Take 100 mg by mouth every morning. , Disp: , Rfl:  .  metFORMIN (GLUCOPHAGE) 500 MG tablet, Take 1,000 mg by mouth 2 (two) times daily with a meal. , Disp: , Rfl:  .  SOLIQUA 100-33  UNT-MCG/ML SOPN, Inject 32 Units into the skin daily before breakfast. , Disp: , Rfl: 2 .  Thiamine HCl (VITAMIN B-1 PO), Take 1 capsule by mouth every Monday, Wednesday, and Friday., Disp: , Rfl:  .  VITAMIN A PO, Take 1 capsule by mouth every Monday, Wednesday, and Friday., Disp: , Rfl:  .  ALPRAZolam (XANAX) 0.5 MG tablet, Take 2 or three before MRI, Disp: 3 tablet, Rfl: 0 .  glimepiride (AMARYL) 4 MG tablet, Take 1 tablet (4 mg total) by mouth 2 (two) times a day for 30 days., Disp: 60 tablet, Rfl: 0 .  pregabalin (LYRICA) 100 MG capsule, Take 1 capsule (100 mg total) by mouth 2 (two) times daily., Disp: 60 capsule, Rfl: 5  PAST MEDICAL HISTORY: Past Medical History:  Diagnosis Date  . Arthritis   . Diabetes mellitus without complication ([place])   . Edema    in left leg in 1980s on left ankle   . Hyperlipidemia   . Hypertension   . PCOS (polycystic ovarian syndrome)   . Superficial thrombophlebitis   . Swelling   . Venous insufficiency   . Vitamin D deficiency     PAST SURGICAL HISTORY: Past Surgical History:  Procedure Laterality Date  . BREAST BIOPSY Right   . JOINT REPLACEMENT  2011    right hip   . left ankle surgery     . TOTAL HIP ARTHROPLASTY Left 12/02/2015   Procedure: LEFT TOTAL HIP ARTHROPLASTY ANTERIOR APPROACH;  Surgeon: [name], MD;  Location: [facility];  Service:  Orthopedics;  Laterality: Left;    FAMILY HISTORY: Family History  Problem Relation Age of Onset  . Diabetes Mother   . Hypertension Mother     SOCIAL HISTORY:  Social History   Socioeconomic History  . Marital status: Single    Spouse name: Not on file  . Number of children: Not on file  . Years of education: Not on file  . Highest education level: Not on file  Occupational History  . Occupation: Therapist, art  Tobacco Use  . Smoking status: Former Smoker    Quit date: 10/01/2002    Years since quitting: 17.5  . Smokeless tobacco: Never Used  Substance and Sexual Activity  . Alcohol use: No  . Drug use: No  . Sexual activity: Not on file  Other Topics Concern  . Not on file  Social History Narrative   Lives with son who is 34 (03/29/19)   Caffeine use: Hot tea daily   Right handed    Social Determinants of Health   Financial Resource Strain:   . Difficulty of Paying Living Expenses:   Food Insecurity:   . Worried About Charity fundraiser in the Last Year:   . Arboriculturist in the Last Year:   Transportation Needs:   . Film/video editor (Medical):   Marland Kitchen Lack of Transportation (Non-Medical):   Physical Activity:   . Days of Exercise per Week:   . Minutes of Exercise per Session:   Stress:   . Feeling of Stress :   Social Connections:   . Frequency of Communication with Friends and Family:   . Frequency of Social Gatherings with Friends and Family:   . Attends Religious Services:   . Active Member of Clubs or Organizations:   . Attends Archivist Meetings:   Marland Kitchen Marital Status:   Intimate Partner Violence:   . Fear of Current or Ex-Partner:   . Emotionally Abused:   .  Physically Abused:   . Sexually Abused:      PHYSICAL EXAM  Vitals:   04/01/20 0953  BP: (!) 147/95  Pulse: 91  Weight: (!) 291 lb (132 kg)  Height: 5' 7"  (1.702 m)    Body mass index is 45.58 kg/m.   General: The patient is well-developed and  well-nourished and in no acute distress  HEENT:  Head is [place]/AT.  Sclera are anicteric.    Skin: Extremities are without rash.  She has severe edema in the left leg (old)  Musculoskeletal:  Back is nontender  Neurologic Exam  Mental status: The patient is alert and oriented x 3 at the time of the examination. The patient has apparent normal recent and remote memory, with an apparently normal attention span and concentration ability.   Speech is normal.  Cranial nerves: Extraocular movements are full.  Color vision is symmetric.  Normal facial strength.  Trapezius and sternocleidomastoid strength is normal. No dysarthria is noted. No obvious hearing deficits are noted.  Motor:  Muscle bulk is normal.   Tone is normal. Strength is  5 / 5 in all 4 extremities though functional tests show reduced gastrocnemius (S1/S2) bilateral.    Sensory: Sensory testing is intact to pinprick, soft touch and vibration sensation in the arms.  Reduced S1 and lower sensation to touch and temperature, worse on left (absent S1 and lower).   Reduced vibration sensation in right foot relative left.     Coordination: Cerebellar testing reveals good finger-nose-finger and heel-to-shin bilaterally.  Gait and station: Station is normal.   Gait is mildly wide with reduced stride but she can walk without a cane.  . Romberg is negative.   Reflexes: Deep tendon reflexes are symmetric and normal bilaterally.      DIAGNOSTIC DATA (LABS, IMAGING, TESTING) - I reviewed patient records, labs, notes, testing and imaging myself where available.  Lab Results  Component Value Date   WBC 8.0 06/06/2019   HGB 12.9 06/06/2019   HCT 38.6 06/06/2019   MCV 81 06/06/2019   PLT 275 06/06/2019      Component Value Date/Time   NA 139 02/15/2019 0454   NA 141 05/04/2017 0818   K 3.7 02/15/2019 0454   CL 105 02/15/2019 0454   CO2 24 02/15/2019 0454   GLUCOSE 114 (H) 02/15/2019 0454   BUN 11 02/15/2019 0454   BUN 11 05/04/2017  0818   CREATININE 0.54 02/15/2019 0454   CALCIUM 9.5 02/15/2019 0454   PROT 6.2 (L) 02/14/2019 0809   PROT 6.9 05/04/2017 0818   ALBUMIN 3.1 (L) 02/14/2019 0809   ALBUMIN 4.1 05/04/2017 0818   AST 42 (H) 02/14/2019 0809   ALT 26 02/14/2019 0809   ALKPHOS 57 02/14/2019 0809   BILITOT 1.2 02/14/2019 0809   BILITOT 0.2 05/04/2017 0818   GFRNONAA >60 02/15/2019 0454   GFRAA >60 02/15/2019 0454   Lab Results  Component Value Date   CHOL 163 02/18/2019   HDL 60 02/18/2019   LDLCALC 81 02/18/2019   TRIG 110 02/18/2019   CHOLHDL 2.7 02/18/2019   Lab Results  Component Value Date   HGBA1C 7.5 (H) 02/14/2019   Lab Results  Component Value Date   QZRAQTMA26 333 12/31/2016   Lab Results  Component Value Date   TSH 0.797 06/06/2019       ASSESSMENT AND PLAN    1. Disease of spinal cord ([place])   2. Dysesthesia   3. Type 2 diabetes mellitus without complication, without  long-term current use of insulin ([place])   4. Numbness     1.   We discussed that the spinal cord lesion is most likely a spinal cord infarct.  However, transverse myelitis is still in the differential diagnosis.  Check MRI of the brain to determine if any subclinical progression that would make MS a possibility.  2.   She would prefer to take the medicine twice a day compared to 3 times a day.  I will switch the gabapentin to pregabalin 100 mg twice daily.  If this does not help, consider lamotrigine for her dysesthetic pain. 3.   Continue hyoscyamine 0.375 bid to try to help both bowel and bladder  4.   RTC 6 months or sooner if new or worsening symptoms   Raquelle Pietro A. Felecia Shelling, MD, [facility] 12/06/1857, 0:93 PM Certified in Neurology, Clinical Neurophysiology, Sleep Medicine and Neuroimaging  [facility] [address] [place], [place] [address] [phone]

## 2020-04-04 ENCOUNTER — Telehealth: Payer: Self-pay | Admitting: Neurology

## 2020-04-04 NOTE — Telephone Encounter (Signed)
[employer] Auth: [id number] (exp. 04/04/20 to 05/19/20) & [id number] (exp. 04/04/20 to 05/19/20) order sent to [facility]. They will reach out to the patient to schedule.

## 2020-04-30 ENCOUNTER — Other Ambulatory Visit: Payer: Self-pay | Admitting: *Deleted

## 2020-04-30 DIAGNOSIS — G959 Disease of spinal cord, unspecified: Secondary | ICD-10-CM

## 2020-04-30 DIAGNOSIS — R208 Other disturbances of skin sensation: Secondary | ICD-10-CM

## 2020-04-30 NOTE — Telephone Encounter (Signed)
I spoke with the patient and she states she would like to go to [facility] due to her insurance is in network with them and no GI. When you get a chance can you put new orders in for [facility]. Thank you!!!

## 2020-04-30 NOTE — Telephone Encounter (Signed)
New orders placed, thank you!

## 2020-04-30 NOTE — Telephone Encounter (Signed)
Patient is scheduled at [facility] for 05/17/20 arrival time is 2:30 pm. Patient is aware of time and day. I did give her their number of [phone] incase she needed to rescheduled for any reason.. she also wanted me to cancel her GI appt so I messaged Jennifer at GI and she canceled her appts.

## 2020-05-17 ENCOUNTER — Other Ambulatory Visit: Payer: Self-pay

## 2020-05-17 ENCOUNTER — Ambulatory Visit (HOSPITAL_COMMUNITY)
Admission: RE | Admit: 2020-05-17 | Discharge: 2020-05-17 | Disposition: A | Discharge status: Home / Self Care | Payer: 59 | Source: Ambulatory Visit | Attending: Neurology | Admitting: Neurology

## 2020-05-17 ENCOUNTER — Encounter (HOSPITAL_COMMUNITY): Payer: Self-pay

## 2020-05-17 DIAGNOSIS — G959 Disease of spinal cord, unspecified: Secondary | ICD-10-CM

## 2020-05-17 DIAGNOSIS — R208 Other disturbances of skin sensation: Secondary | ICD-10-CM | POA: Diagnosis present

## 2020-05-17 MED ORDER — GADOBUTROL 1 MMOL/ML IV SOLN
10.0000 mL | Freq: Once | INTRAVENOUS | Status: AC | PRN
  Administered 2020-05-17: 10 mL via INTRAVENOUS

## 2020-05-18 ENCOUNTER — Other Ambulatory Visit: Payer: 59

## 2020-08-19 ENCOUNTER — Encounter: Payer: Self-pay | Admitting: *Deleted

## 2020-08-19 ENCOUNTER — Telehealth: Payer: Self-pay | Admitting: Neurology

## 2020-08-19 NOTE — Telephone Encounter (Signed)
Pt. states she is having lower half issues. She states she needs a jury duty letter explaining how disabilities effect her ability to serve. She states she needs this by the end of Jan. Please advise.

## 2020-08-19 NOTE — Telephone Encounter (Signed)
Ok to write this

## 2020-08-19 NOTE — Telephone Encounter (Signed)
Called pt back to further discuss. She was summoned to serve [employer] 10/23/2020. Has hx spinal stroke. Hard for her to control bowel/bladder because of this. Needs letter explaining disability and how it affects her ability to serve. Juror number: [id number] (would like this on letter). Does not need letter until end of January.

## 2020-08-19 NOTE — Telephone Encounter (Addendum)
Letter printed, MD signed. Ready for pick up. Called pt, she would like it mailed. Verified address on file. I placed in mail.

## 2020-09-11 ENCOUNTER — Other Ambulatory Visit (HOSPITAL_COMMUNITY): Payer: Self-pay | Admitting: Orthopedic Surgery

## 2020-09-18 ENCOUNTER — Telehealth: Payer: Self-pay | Admitting: Neurology

## 2020-09-18 NOTE — Telephone Encounter (Signed)
Pt called wanting to discuss with provider a fall she had on the 15th. Pt fell on her buttocks trying to get into bed. Ever since the fall she has been feeling more numbness on her L side. Pt states she is scheduled for surgery on Feb 10th for her ankle. Please advise.

## 2020-09-18 NOTE — Telephone Encounter (Signed)
I spoke to Ms. Laura Blackwell.  She fell a few days ago.  She had felt baseline prior to the fall (some numbness in the left leg) but after the fall has had much more numbness and some weakness.  It is bad enough that it is affecting her ability to walk.  Of note, she actually felt she had been improving over since her last visit with me about 6 months ago.  I will see if we can get her worked into the schedule for tomorrow so we can better decide what further evaluation and treatment is indicated.

## 2020-09-19 ENCOUNTER — Ambulatory Visit: Payer: Self-pay | Admitting: Neurology

## 2020-09-25 ENCOUNTER — Other Ambulatory Visit: Payer: Self-pay | Admitting: Family Medicine

## 2020-09-25 DIAGNOSIS — Z1231 Encounter for screening mammogram for malignant neoplasm of breast: Secondary | ICD-10-CM

## 2020-10-01 IMAGING — MR MR HEAD WO/W CM
14 of 16 series · 42 of 48 positions shown · IV contrast (Gadavist)
Comparison: MRI head 02/15/2020

CLINICAL DATA: Numbness and tingling, paresthesia. Spinal cord
lesion. Rule out multiple sclerosis.

EXAM:
MRI HEAD WITHOUT AND WITH CONTRAST
TECHNIQUE: Multiplanar, multiecho pulse sequences of the brain and surrounding
structures were obtained without and with intravenous contrast.
CONTRAST:  10mL GADAVIST GADOBUTROL 1 MMOL/ML IV SOLN

[Series 5: DWI · axial · 3.0mm · 0.88mm/px · z∈[-131,+21]mm · 8 of 104 slices shown (1 of 4)]
[im 1/104]
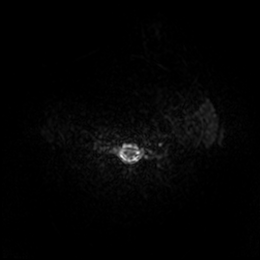
[im 15/104]
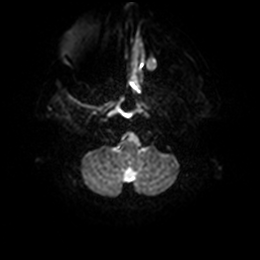
[im 30/104]
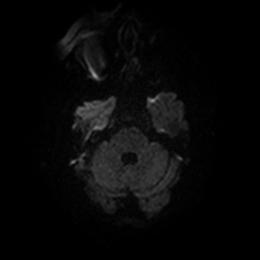
[im 45/104]
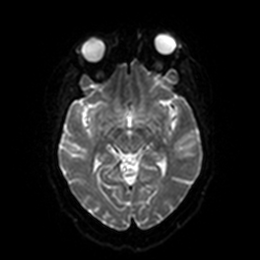
[im 59/104]
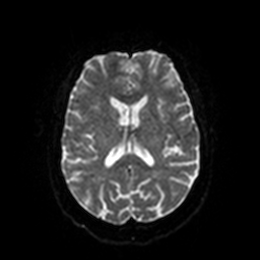
[im 74/104]
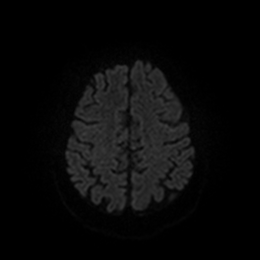
[im 89/104]
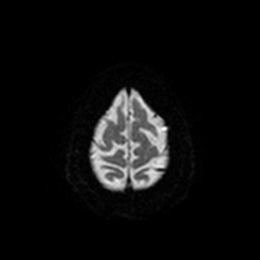
[im 104/104]
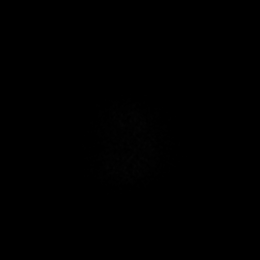

[Series 6: DWI · axial · 3.0mm · 0.88mm/px · z∈[-131,+15]mm · 3 of 50 slices shown (2 of 4)]
[im 1/50]
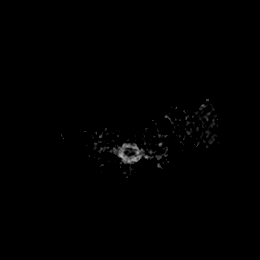
[im 25/50]
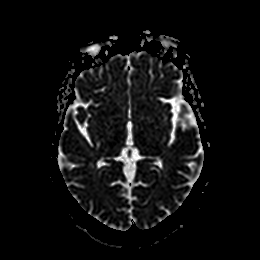
[im 50/50]
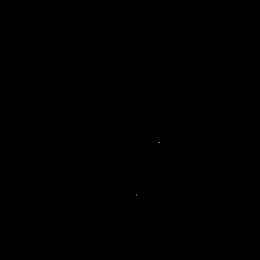

[Series 7: DWI · coronal · 4.0mm · 0.88mm/px · 5 of 72 slices shown (3 of 4)]
[im 1/72]
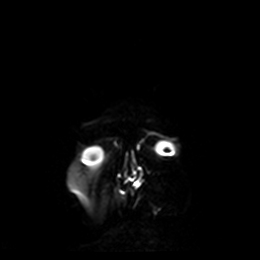
[im 18/72]
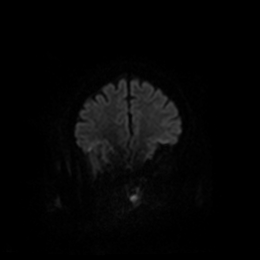
[im 36/72]
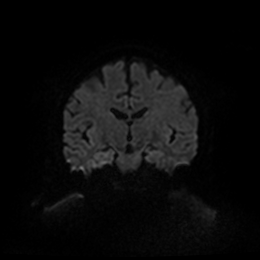
[im 54/72]
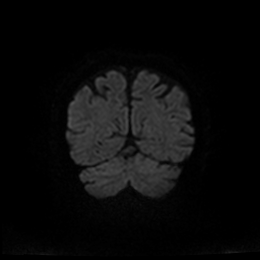
[im 72/72]
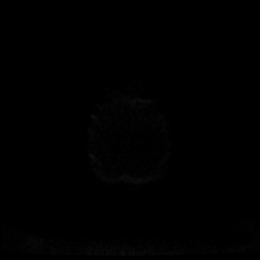

[Series 8: DWI · coronal · 4.0mm · 0.88mm/px · 2 of 36 slices shown (4 of 4)]
[im 1/36]
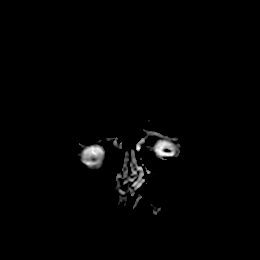
[im 36/36]
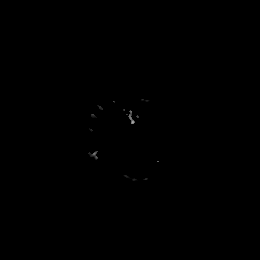

[Series 9: T1 · sagittal · 5.0mm · 0.75mm/px · 2 of 25 slices shown]
[im 1/25]
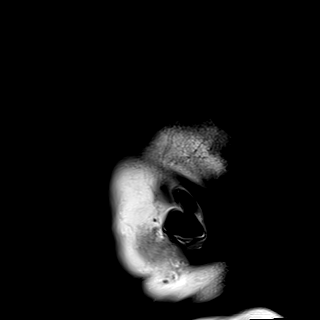
[im 25/25]
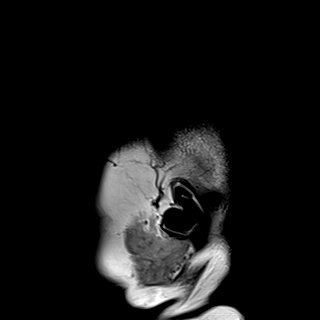

[Series 10: T2 · axial · 5.0mm · 0.72mm/px · z∈[-126,+17]mm · 2 of 25 slices shown]
[im 1/25]
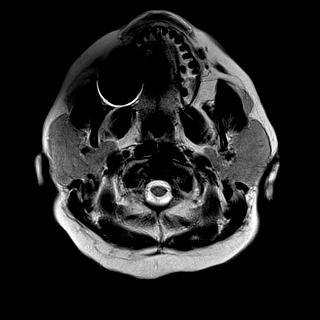
[im 25/25]
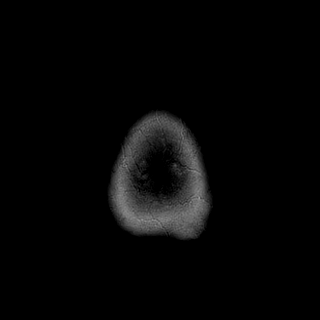

[Series 11: FLAIR · axial · 5.0mm · 0.45mm/px · z∈[-127,+16]mm · 2 of 25 slices shown]
[im 1/25]
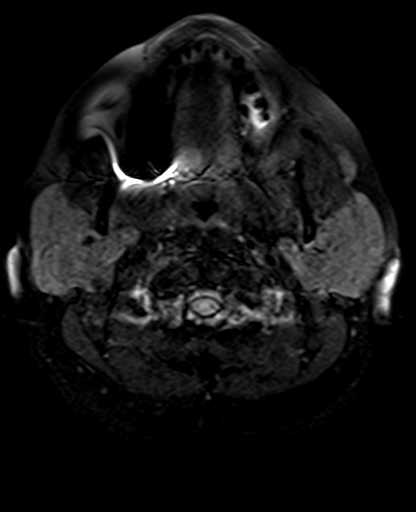
[im 25/25]
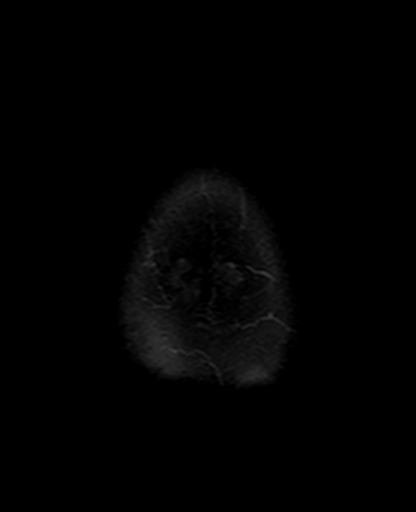

[Series 16: mag_images · axial · 3.0mm · 0.90mm/px · z∈[-132,+20]mm · 3 of 52 slices shown]
[im 1/52]
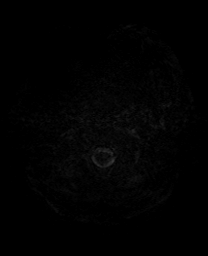
[im 26/52]
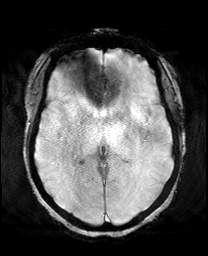
[im 52/52]
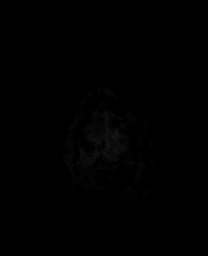

[Series 17: pha_images · axial · 3.0mm · 0.90mm/px · z∈[-132,+20]mm · 3 of 52 slices shown]
[im 1/52]
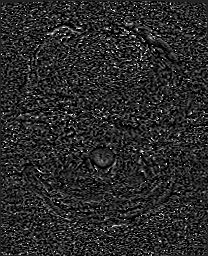
[im 26/52]
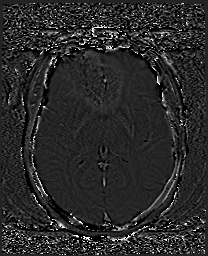
[im 52/52]
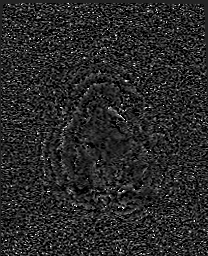

[Series 18: swi_images · axial · 3.0mm · 0.90mm/px · z∈[-132,+20]mm · 3 of 52 slices shown]
[im 1/52]
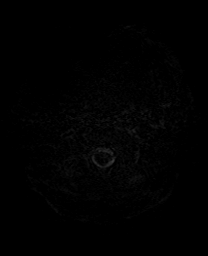
[im 26/52]
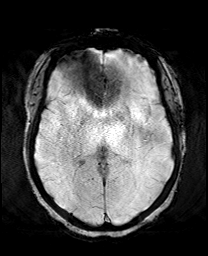
[im 52/52]
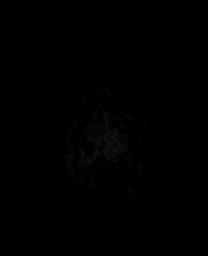

[Series 19: mip_images(sw) · axial · 24.0mm · 0.90mm/px · z∈[-121,+10]mm · 3 of 45 slices shown]
[im 1/45]
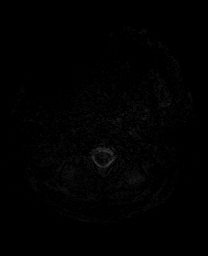
[im 23/45]
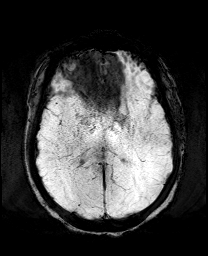
[im 45/45]
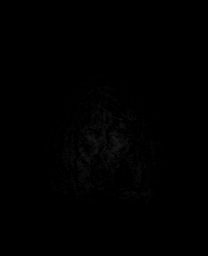

[Series 21: T2 post-contrast · coronal · 5.0mm · 0.72mm/px · 2 of 28 slices shown]
[im 1/28]
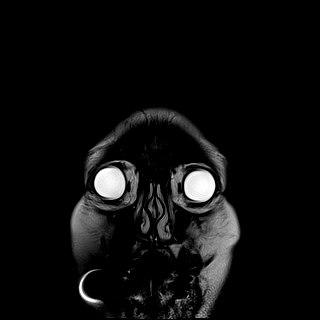
[im 28/28]
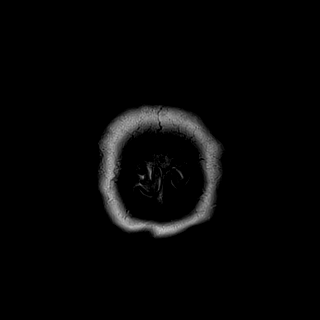

[Series 23: T1 post-contrast · coronal · 5.0mm · 0.34mm/px · 2 of 28 slices shown (1 of 2)]
[im 1/28]
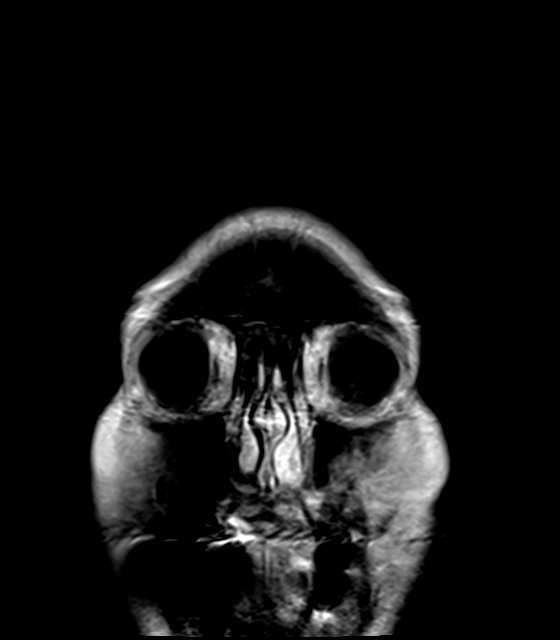
[im 28/28]
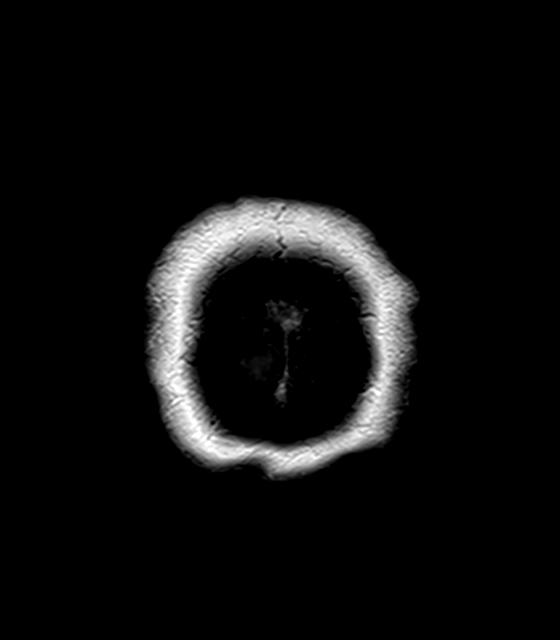

[Series 24: T1 post-contrast · sagittal · 5.0mm · 0.72mm/px · 2 of 25 slices shown (2 of 2)]
[im 1/25]
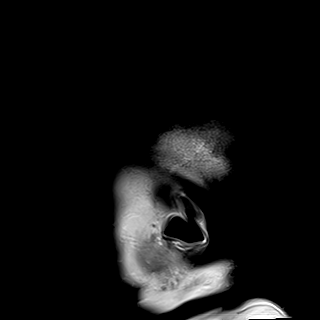
[im 25/25]
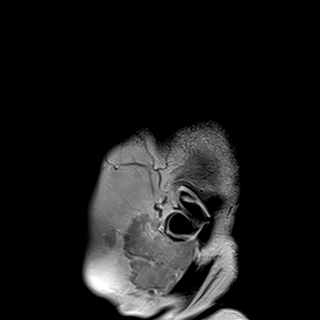

[42 of 48 positions shown; findings below may reference images not displayed]

FINDINGS: Brain: No acute infarction, hemorrhage, hydrocephalus, extra-axial
collection or mass lesion.

Normal white matter. No evidence of demyelinating disease. Normal
enhancement postcontrast administration.

Vascular: Normal arterial flow voids

Skull and upper cervical spine: No focal skeletal lesion.

Sinuses/Orbits: Mild mucosal edema paranasal sinuses. Negative
orbit.

Other: None
IMPRESSION: Normal MRI brain with contrast. No evidence of demyelinating
disease.

## 2020-10-03 ENCOUNTER — Encounter (HOSPITAL_BASED_OUTPATIENT_CLINIC_OR_DEPARTMENT_OTHER): Payer: Self-pay | Admitting: Orthopedic Surgery

## 2020-10-03 ENCOUNTER — Encounter: Payer: Self-pay | Admitting: Family Medicine

## 2020-10-03 ENCOUNTER — Ambulatory Visit: Payer: 59 | Admitting: Family Medicine

## 2020-10-03 ENCOUNTER — Other Ambulatory Visit: Payer: Self-pay

## 2020-10-03 VITALS — BP 152/96 | HR 92 | Ht 67.0 in

## 2020-10-03 DIAGNOSIS — G959 Disease of spinal cord, unspecified: Secondary | ICD-10-CM

## 2020-10-03 DIAGNOSIS — R200 Anesthesia of skin: Secondary | ICD-10-CM

## 2020-10-03 DIAGNOSIS — R269 Unspecified abnormalities of gait and mobility: Secondary | ICD-10-CM | POA: Diagnosis not present

## 2020-10-03 DIAGNOSIS — R208 Other disturbances of skin sensation: Secondary | ICD-10-CM | POA: Diagnosis not present

## 2020-10-03 NOTE — Progress Notes (Signed)
Chief Complaint  Patient presents with  . Follow-up    RM 1 alone  Pt stated she had fall Jan 15 and her L side is completely weak and numb      HISTORY OF PRESENT ILLNESS: 10/03/20  Laura Blackwell is a 65 y.o. female here today for follow up for a spinal cord lesion of T12-L1 in 01/2019. Concerns for spinal cord infarct versus transverse myelitis versus MS. Follow up imaging 05/17/2020 was unchanged. Workup shows MS less likely.   She called 1/19 with concerns of weakness and fall 1/15. She reports trying to get into her bed. She was bearing weight on the left leg and feels that her leg just gave out on her. She had felt at her baseline prior to fall. Seemed to be improving over last 6 months. Dr Felecia Shelling scheduled visit for 1/20 but was cancelled by patient because she was feeling better.    She feels that she can barely put weight on left leg. She is using her walker but still feels she is having a hard time. She reports left sided numbness had improved to about 25 %. After fall numbness was around 90 %, mostly in the perineal area but also throughout left through, down leg and to bottom of left foot. Now numbness is improving. She can feel sensations equally with exception of left foot. Gabapentin helps, using 300 BID. Pregabalin 128m BID was not effective.   She reports that she is having right ankle surgery next week. She has had to use left side more due to pain. She has had right hip replacement.   Bowel and bladder habits are unchanged. Hyoscamine helps.    HISTORY (copied from Dr SGarth Bignessprevious note)  Laura Blackwell a 65year old woman with a spinal cord lesion.  Update 04/01/2020: She has abnormal sensation with a burning sensation and cold sensation in her left buttock down to her left leg. Initially, her right side was worse but it has improved.  She continues to have near-total numbness in the perineum, though she feels there was some recovery on the right.   She notes the right side had more dysesthesias but now they are milder.   She takes gabapentin 300 mg bid (skips pm dose as she does not like taking med's).    Of note, she is able to feel more in her legs and has less numbness on the right and very slightly less numbness on the left.    She has edema in her left leg since an operation many years ago.  She usually wears compression stockings (did not today due to exam).    She can walk without a cane but does better with it.   She has bowel and urinary urgency.  However, she rarely has incontinence now..   She is on hyoscyamine.   From initial consult 03/29/2019: Laura Blackwell a 65year old woman who had the onset of lower back pain and leg weakness 02/10/2019. She was at work, when she stood up she fell flat on her face.    She did not get better over the next 2 days and also had urinary incontinence and presented to the ED 02/13/19.  She was noted to have no sensation in the perineum and sacral area.  She had incontinence of bowel and bladder. She had some left leg weakness.    She was admitted and MRI showed an abnormal focus in the conus medullaris and lower spinal cord adjacent to  T12-L1.  This was felt to be a demyelinating lesion versus stroke and she received 5 days of high-dose IV steroids.  While in the hospital she had a lumbar puncture.  Oligoclonal bands were not present.  She notes improving since she received the steroids.   She is now able to walk some steps without a cane but uses a walker due to poor balance.   In PT she practiced using a cane.   She still has numbness and also feels a cold sensation in her legs/groin/buttocks.    She never had any numbness or weakness in the arms or chest/abdomen.   She notes some improvement in sensation but still has near total numbness in the groin/buttocks.   She now is starting to get a signal that she needs to use the bathroom.   She has not had incontinence the last few weeks.      She does not recall any infections in th presenting few weeks and had no vaccinations.   n She has had DM x 7 years and has been on insulin x 1 year.   She denies any numbness in her feet or eye or kidney issues related to DM   The following MRIs and CTs were reviewed: 02/13/2019: MRI of the lumbar spine showed abnormal signal in the conus medullaris.  There is severe facet hypertrophy with 5 mm of anterolisthesis and moderately severe spinal stenosis at L4-L5.  Mild degenerative changes at the other levels. 02/14/2019: MRI of the thoracic (with and without) and lumbar spine (with contrast)  There is a lesion of the conus medullaris and distal thoracic spinal cord.  It does not enhance.   02/16/2019: MRI of the brain and cervical spine.  The brain was normal for age.  The spinal cord was normal.  There was some degenerative changes noted at the C5-C6 with left foraminal narrowing. 02/18/2019: CT angiogram of the head and neck were normal.  NMO-IgG Ab was negative.   HIV negative.   ESR mildly elevated at 32.  CSF showed no oligoclonal bands.   CSF proteins and glucose mildly elevated.  7 WBC.   CSF VDRL negative.      REVIEW OF SYSTEMS: Out of a complete 14 system review of symptoms, the patient complains only of the following symptoms, right ankle and hip pain, dysesthesias, numbness of left foot, and all other reviewed systems are negative.    ALLERGIES: No Known Allergies   HOME MEDICATIONS: Outpatient Medications Prior to Visit  Medication Sig Dispense Refill  . amLODipine (NORVASC) 5 MG tablet Take 5 mg by mouth daily.     Marland Kitchen aspirin EC 81 MG tablet Take 81 mg by mouth daily.    Marland Kitchen atorvastatin (LIPITOR) 20 MG tablet Take 20 mg by mouth daily.     . Cholecalciferol 125 MCG (5000 UT) capsule Take 5,000 Units by mouth every Monday, Wednesday, and Friday.    . Cyanocobalamin (VITAMIN B-12 PO) Take 1 capsule by mouth every Monday, Wednesday, and Friday.    . furosemide (LASIX) 20 MG  tablet Take 20 mg by mouth 2 (two) times daily.     Marland Kitchen gabapentin (NEURONTIN) 300 MG capsule One po qAm, one po q evening and one po qHS 90 capsule 11  . glimepiride (AMARYL) 4 MG tablet Take 1 tablet (4 mg total) by mouth 2 (two) times a day for 30 days. 60 tablet 0  . glucose blood test strip 1 each by Other route 2 (two) times  daily. Use as instructed    . hyoscyamine (LEVBID) 0.375 MG 12 hr tablet Take 1 tablet (0.375 mg total) by mouth 2 (two) times daily. 60 tablet 5  . Insulin Pen Needle (BD PEN NEEDLE NANO U/F) 32G X 4 MM MISC 1 each by Does not apply route daily. 100 each 0  . losartan (COZAAR) 100 MG tablet Take 100 mg by mouth every morning.     . metFORMIN (GLUCOPHAGE) 500 MG tablet Take 1,000 mg by mouth 2 (two) times daily with a meal.     . SOLIQUA 100-33 UNT-MCG/ML SOPN Inject 32 Units into the skin daily before breakfast.   2  . Thiamine HCl (VITAMIN B-1 PO) Take 1 capsule by mouth every Monday, Wednesday, and Friday.    . pregabalin (LYRICA) 100 MG capsule Take 1 capsule (100 mg total) by mouth 2 (two) times daily. 60 capsule 5  . ALPRAZolam (XANAX) 0.5 MG tablet Take 2 or three before MRI 3 tablet 0  . VITAMIN A PO Take 1 capsule by mouth every Monday, Wednesday, and Friday.     No facility-administered medications prior to visit.     PAST MEDICAL HISTORY: Past Medical History:  Diagnosis Date  . Arthritis   . Diabetes mellitus without complication ([place])   . Edema    in left leg in 1980s on left ankle   . Hyperlipidemia   . Hypertension   . PCOS (polycystic ovarian syndrome)   . Superficial thrombophlebitis   . Swelling   . Venous insufficiency   . Vitamin D deficiency      PAST SURGICAL HISTORY: Past Surgical History:  Procedure Laterality Date  . BREAST BIOPSY Right   . JOINT REPLACEMENT  2011    right hip   . left ankle surgery     . TOTAL HIP ARTHROPLASTY Left 12/02/2015   Procedure: LEFT TOTAL HIP ARTHROPLASTY ANTERIOR APPROACH;  Surgeon: [name],  MD;  Location: [facility];  Service: Orthopedics;  Laterality: Left;     FAMILY HISTORY: Family History  Problem Relation Age of Onset  . Diabetes Mother   . Hypertension Mother      SOCIAL HISTORY: Social History   Socioeconomic History  . Marital status: Single    Spouse name: Not on file  . Number of children: Not on file  . Years of education: Not on file  . Highest education level: Not on file  Occupational History  . Occupation: Therapist, art  Tobacco Use  . Smoking status: Former Smoker    Quit date: 10/01/2002    Years since quitting: 18.0  . Smokeless tobacco: Never Used  Substance and Sexual Activity  . Alcohol use: No  . Drug use: No  . Sexual activity: Not on file  Other Topics Concern  . Not on file  Social History Narrative   Lives with son who is 74 (03/29/19)   Caffeine use: Hot tea daily   Right handed    Social Determinants of Health   Financial Resource Strain: Not on file  Food Insecurity: Not on file  Transportation Needs: Not on file  Physical Activity: Not on file  Stress: Not on file  Social Connections: Not on file  Intimate Partner Violence: Not on file      PHYSICAL EXAM  Vitals:   10/03/20 0957  BP: (!) 152/96  Pulse: 92  Height: 5' 7"  (1.702 m)   Body mass index is 45.58 kg/m.   Generalized: Well developed, in no acute distress  Cardiology: normal rate and rhythm, no murmur auscultated  Respiratory: clear to auscultation bilaterally    Neurological examination  Mentation: Alert oriented to time, place, history taking. Follows all commands speech and language fluent Cranial nerve II-XII: Pupils were equal round reactive to light. Extraocular movements were full, visual field were full on confrontational test. Facial sensation and strength were normal. Uvula tongue midline. Head turning and shoulder shrug  were normal and symmetric. Motor: The motor testing reveals 5 over 5 strength of all 4 extremities. Bilateral  hip flexion, lower extension and flexion all 5/5. Good symmetric motor tone is noted throughout.  Sensory: Sensory testing is intact to soft touch on all 4 extremities. Pinprick testing intact bilaterally, vibratory decreased in left ankle.  No evidence of extinction is noted.  Coordination: Cerebellar testing reveals good finger-nose-finger and heel-to-shin bilaterally.  Gait and station: Gait is wide but stable with walker. Slight right limp.  Reflexes: Deep tendon reflexes are symmetric but brisk in bilateral pattellar      DIAGNOSTIC DATA (LABS, IMAGING, TESTING) - I reviewed patient records, labs, notes, testing and imaging myself where available.  Lab Results  Component Value Date   WBC 8.0 06/06/2019   HGB 12.9 06/06/2019   HCT 38.6 06/06/2019   MCV 81 06/06/2019   PLT 275 06/06/2019      Component Value Date/Time   NA 139 02/15/2019 0454   NA 141 05/04/2017 0818   K 3.7 02/15/2019 0454   CL 105 02/15/2019 0454   CO2 24 02/15/2019 0454   GLUCOSE 114 (H) 02/15/2019 0454   BUN 11 02/15/2019 0454   BUN 11 05/04/2017 0818   CREATININE 0.54 02/15/2019 0454   CALCIUM 9.5 02/15/2019 0454   PROT 6.2 (L) 02/14/2019 0809   PROT 6.9 05/04/2017 0818   ALBUMIN 3.1 (L) 02/14/2019 0809   ALBUMIN 4.1 05/04/2017 0818   AST 42 (H) 02/14/2019 0809   ALT 26 02/14/2019 0809   ALKPHOS 57 02/14/2019 0809   BILITOT 1.2 02/14/2019 0809   BILITOT 0.2 05/04/2017 0818   GFRNONAA >60 02/15/2019 0454   GFRAA >60 02/15/2019 0454   Lab Results  Component Value Date   CHOL 163 02/18/2019   HDL 60 02/18/2019   LDLCALC 81 02/18/2019   TRIG 110 02/18/2019   CHOLHDL 2.7 02/18/2019   Lab Results  Component Value Date   HGBA1C 7.5 (H) 02/14/2019   Lab Results  Component Value Date   [id number] 829 12/31/2016   Lab Results  Component Value Date   TSH 0.797 06/06/2019    No flowsheet data found.   No flowsheet data found.   ASSESSMENT AND PLAN  65 y.o. year old female  has a  past medical history of Arthritis, Diabetes mellitus without complication ([place]), Edema, Hyperlipidemia, Hypertension, PCOS (polycystic ovarian syndrome), Superficial thrombophlebitis, Swelling, Venous insufficiency, and Vitamin D deficiency. here with   Disease of spinal cord ([place])  Dysesthesia  Gait disturbance  Numbness  Porshia is doing fairly well, today.  She did have a fall on 09/14/2018.  Following the fall she felt that left-sided weakness and numbness was much worse than it had been over the past 6 months.  Fortunately, over the past week symptoms have slowly improved.  Neuro examination is unremarkable today.  No new areas of numbness or weakness.  She has right ankle surgery planned with orthopedics next week.  Last imaging in 05/2021 was stable.  We have discussed the option of reordering MRI.  Neuro examination is unremarkable,  therefore, we will hold off at this time.  She will monitor symptoms very closely and notify me for any worsening.  A1c has been well managed.  I do not feel that steroid management would be ideal with upcoming surgery planned.  Bowel and bladder habits are unchanged.  We have discussed need for continuous use of walker and fall precautions.  I will update Dr. Felecia Shelling on her progress.  May alter plan per his review.  She has scheduled follow-up with him in August.  She will call us immediately for any change in symptoms.  Healthy lifestyle habits encouraged.  She verbalizes understanding and agreement with this plan.  No orders of the defined types were placed in this encounter.    No orders of the defined types were placed in this encounter.     I spent 40 minutes of face-to-face and non-face-to-face time with patient.  This included previsit chart review, lab review, study review, order entry, electronic health record documentation, patient education.    Debbora Presto, MSN, FNP-C 10/03/2020, 10:35 AM  [facility] [address], [place], [place] [address] [phone]

## 2020-10-03 NOTE — Progress Notes (Signed)
Consider imaging if symptoms worsen.  I have read the note, and I agree with the clinical assessment and plan.  Gershom Brobeck A. Felecia Shelling, MD, PhD, [facility] Certified in Neurology, Clinical Neurophysiology, Sleep Medicine, Pain Medicine and Neuroimaging  [facility] [address], [place], [place] [address] [phone]

## 2020-10-03 NOTE — Progress Notes (Signed)
Patient states she fell 1/15 and has weakness and neuropathy in her left and leg. Claiborne Billings at Dr. Nona Dell office made aware to verify patient is able to have right sided surgery.

## 2020-10-03 NOTE — Patient Instructions (Addendum)
Below is our plan:  We will continue to monitor symptoms closely. Continue gabapentin 300g twice daily. Please be very cautious. Fall precautions as discussed. Use your walker at all times until stronger. Follow up closely with orthopedics.   Please make sure you are staying well hydrated. I recommend 50-60 ounces daily. Well balanced diet and regular exercise encouraged.    Please continue follow up with care team as directed.   Follow up with Dr Felecia Shelling in August as already scheduled.   You may receive a survey regarding today's visit. I encourage you to leave honest feed back as I do use this information to improve patient care. Thank you for seeing me today!    Fall Prevention in the Home, Adult Falls can cause injuries and can happen to people of all ages. There are many things you can do to make your home safe and to help prevent falls. Ask for help when making these changes. What actions can I take to prevent falls? General Instructions  Use good lighting in all rooms. Replace any light bulbs that burn out.  Turn on the lights in dark areas. Use night-lights.  Keep items that you use often in easy-to-reach places. Lower the shelves around your home if needed.  Set up your furniture so you have a clear path. Avoid moving your furniture around.  Do not have throw rugs or other things on the floor that can make you trip.  Avoid walking on wet floors.  If any of your floors are uneven, fix them.  Add color or contrast paint or tape to clearly mark and help you see: ? Grab bars or handrails. ? First and last steps of staircases. ? Where the edge of each step is.  If you use a stepladder: ? Make sure that it is fully opened. Do not climb a closed stepladder. ? Make sure the sides of the stepladder are locked in place. ? Ask someone to hold the stepladder while you use it.  Know where your pets are when moving through your home. What can I do in the bathroom?  Keep the  floor dry. Clean up any water on the floor right away.  Remove soap buildup in the tub or shower.  Use nonskid mats or decals on the floor of the tub or shower.  Attach bath mats securely with double-sided, nonslip rug tape.  If you need to sit down in the shower, use a plastic, nonslip stool.  Install grab bars by the toilet and in the tub and shower. Do not use towel bars as grab bars.      What can I do in the bedroom?  Make sure that you have a light by your bed that is easy to reach.  Do not use any sheets or blankets for your bed that hang to the floor.  Have a firm chair with side arms that you can use for support when you get dressed. What can I do in the kitchen?  Clean up any spills right away.  If you need to reach something above you, use a step stool with a grab bar.  Keep electrical cords out of the way.  Do not use floor polish or wax that makes floors slippery. What can I do with my stairs?  Do not leave any items on the stairs.  Make sure that you have a light switch at the top and the bottom of the stairs.  Make sure that there are handrails on both  sides of the stairs. Fix handrails that are broken or loose.  Install nonslip stair treads on all your stairs.  Avoid having throw rugs at the top or bottom of the stairs.  Choose a carpet that does not hide the edge of the steps on the stairs.  Check carpeting to make sure that it is firmly attached to the stairs. Fix carpet that is loose or worn. What can I do on the outside of my home?  Use bright outdoor lighting.  Fix the edges of walkways and driveways and fix any cracks.  Remove anything that might make you trip as you walk through a door, such as a raised step or threshold.  Trim any bushes or trees on paths to your home.  Check to see if handrails are loose or broken and that both sides of all steps have handrails.  Install guardrails along the edges of any raised decks and  porches.  Clear paths of anything that can make you trip, such as tools or rocks.  Have leaves, snow, or ice cleared regularly.  Use sand or salt on paths during winter.  Clean up any spills in your garage right away. This includes grease or oil spills. What other actions can I take?  Wear shoes that: ? Have a low heel. Do not wear high heels. ? Have rubber bottoms. ? Feel good on your feet and fit well. ? Are closed at the toe. Do not wear open-toe sandals.  Use tools that help you move around if needed. These include: ? Canes. ? Walkers. ? Scooters. ? Crutches.  Review your medicines with your doctor. Some medicines can make you feel dizzy. This can increase your chance of falling. Ask your doctor what else you can do to help prevent falls. Where to find more information  Centers for Disease Control and Prevention, [employer]AD[employer]: http://www.wolf.info/  National Institute on Aging: http://kim-miller.com/ Contact a doctor if:  You are afraid of falling at home.  You feel weak, drowsy, or dizzy at home.  You fall at home. Summary  There are many simple things that you can do to make your home safe and to help prevent falls.  Ways to make your home safe include removing things that can make you trip and installing grab bars in the bathroom.  Ask for help when making these changes in your home. This information is not intended to replace advice given to you by your health care provider. Make sure you discuss any questions you have with your health care provider. Document Revised: 03/20/2020 Document Reviewed: 03/20/2020 Elsevier Patient Education  Laura Blackwell.    Spinal Cord Infarction  A spinal cord infarction is a loss of blood supply to your spinal cord. It is sometimes called a spinal cord stroke. A spinal cord infarction can cause damage that prevents signals from traveling back and forth between your spinal cord and the rest of your body. The main blood vessel that supplies  your spinal cord is called the anterior spinal artery. When this artery is blocked, the lower body is usually affected. What are the causes? This condition may be caused by:  Narrowing and thickening of arteries that supply your spinal cord (arteriosclerosis). The arteries may become narrow enough to block your blood supply.  A clot that forms in an artery farther away and then breaks loose and blocks the arteries that supply your spinal cord.  Aortic dissection. Aortic dissection happens when there is a tear in the wall of  the body's main blood vessel (aorta). What increases the risk? You are more likely to develop this condition if you:  Have high blood pressure.  Are a smoker.  Have high cholesterol.  Have heart disease.  Have a family history of stroke or heart disease. What are the signs or symptoms? Symptoms generally appear in your body at and below the area of infarction within minutes or a few hours of the infarction. They can vary from person to person. Symptoms may include:  Sudden and severe back pain.  Muscle spasms.  Aching pain down through your legs.  Weakness in your legs.  Loss of movement in your legs (paralysis).  Inability to feel pain and temperature in your lower body.  Inability to control urination or bowel movements (incontinence). How is this diagnosed? Your health care provider may suspect spinal cord infarction based on the symptoms that suddenly appear in your lower body. A health care provider who specializes in the brain and spinal cord (neurologist) may examine you for:  Muscle weakness.  Areas of numbness.  Abnormal responses when your muscles are tested (absent reflexes). You may have tests to confirm the diagnosis. These may include:  An MRI of your spinal cord. This is the most important test for diagnosing spinal cord infarction.  A CT of your spinal cord if an MRI is not possible.  Blood tests.  Tests to measure how well your  nerves send messages to and from your spinal cord (electromyography). How is this treated? Treatment depends on your symptoms. It may include:  Blood-thinning medicines (anticoagulants) to improve circulation and prevent blood clots.  Medicines to improve bladder control.  Muscle relaxants to relieve muscle spasms.  Physical and occupational therapy to help you recover from weakness or paralysis.  Treatment for high blood pressure and high cholesterol. Recovery depends on the severity of the condition and how quickly treatment was started. Spinal cord damage can be permanent. If recovery is possible, it can take months or years.   Follow these instructions at home: Medicines  Take over-the-counter and prescription medicines only as told by your health care provider.  Do not drive or use heavy machinery while taking prescription pain medicine.  If you are taking prescription pain medicine, take actions to prevent or treat constipation. Your health care provider may recommend that you: ? Drink enough fluid to keep your urine pale yellow. ? Eat foods that are high in fiber, such as fresh fruits and vegetables, whole grains, and beans. ? Limit foods that are high in fat and processed sugars, such as fried or sweet foods. ? Take an over-the-counter or prescription medicine for constipation. Activity  Ask your health care provider what activities are safe for you.  Do daily exercises as directed by your physical therapist. General instructions  Maintain a healthy weight.  Eat a heart-healthy diet. Include lots of fiber to prevent constipation.  Make sure you have a good support system at home. Let someone know if you are struggling with anxiety or depression.  Do not use any products that contain nicotine or tobacco, such as cigarettes and e-cigarettes. If you need help quitting, ask your health care provider.  Work closely with all your health care providers. These may include  therapists to help you: ? Exercise. ? Take care of yourself at home. ? Manage bowel and bladder problems. ? Cope with mental health conditions. ? Manage pain.  Keep all follow-up visits as told by your health care provider. This is  important.   Contact a health care provider if:  You have chills or a fever.  Your symptoms change or get worse.  You need more support at home. Get help right away if:  You have chest pain.  You have trouble breathing. Summary  A spinal cord infarction is a loss of blood supply to your spinal cord. It is sometimes called a spinal cord stroke.  A spinal cord infarction can cause damage that prevents signals from traveling back and forth between your spinal cord and the rest of your body.  Symptoms generally appear in your lower body within minutes or a few hours of the infarction.  Recovery depends on the severity of the condition and how quickly treatment was started.  Work closely with all your health care providers and keep follow-up appointments as told. This is important. This information is not intended to replace advice given to you by your health care provider. Make sure you discuss any questions you have with your health care provider. Document Revised: 09/28/2017 Document Reviewed: 09/28/2017 Elsevier Patient Education  2021 [employer].

## 2020-10-07 ENCOUNTER — Other Ambulatory Visit (HOSPITAL_COMMUNITY): Payer: 59

## 2020-10-08 ENCOUNTER — Other Ambulatory Visit (HOSPITAL_COMMUNITY)
Admission: RE | Admit: 2020-10-08 | Discharge: 2020-10-08 | Disposition: A | Discharge status: Home / Self Care | Payer: 59 | Source: Ambulatory Visit | Attending: Orthopedic Surgery | Admitting: Orthopedic Surgery

## 2020-10-08 ENCOUNTER — Encounter (HOSPITAL_BASED_OUTPATIENT_CLINIC_OR_DEPARTMENT_OTHER)
Admission: RE | Admit: 2020-10-08 | Discharge: 2020-10-08 | Disposition: A | Discharge status: Home / Self Care | Payer: 59 | Source: Ambulatory Visit | Attending: Orthopedic Surgery | Admitting: Orthopedic Surgery

## 2020-10-08 DIAGNOSIS — M6701 Short Achilles tendon (acquired), right ankle: Secondary | ICD-10-CM | POA: Diagnosis not present

## 2020-10-08 DIAGNOSIS — Z20822 Contact with and (suspected) exposure to covid-19: Secondary | ICD-10-CM | POA: Insufficient documentation

## 2020-10-08 DIAGNOSIS — M67873 Other specified disorders of tendon, right ankle and foot: Secondary | ICD-10-CM | POA: Diagnosis not present

## 2020-10-08 DIAGNOSIS — Z7982 Long term (current) use of aspirin: Secondary | ICD-10-CM | POA: Diagnosis not present

## 2020-10-08 DIAGNOSIS — Z01818 Encounter for other preprocedural examination: Secondary | ICD-10-CM | POA: Diagnosis present

## 2020-10-08 DIAGNOSIS — Z79899 Other long term (current) drug therapy: Secondary | ICD-10-CM | POA: Diagnosis not present

## 2020-10-08 DIAGNOSIS — Z794 Long term (current) use of insulin: Secondary | ICD-10-CM | POA: Diagnosis not present

## 2020-10-08 DIAGNOSIS — M19071 Primary osteoarthritis, right ankle and foot: Secondary | ICD-10-CM | POA: Diagnosis not present

## 2020-10-08 DIAGNOSIS — Z7984 Long term (current) use of oral hypoglycemic drugs: Secondary | ICD-10-CM | POA: Diagnosis not present

## 2020-10-08 DIAGNOSIS — Z96643 Presence of artificial hip joint, bilateral: Secondary | ICD-10-CM | POA: Diagnosis not present

## 2020-10-08 DIAGNOSIS — Z87891 Personal history of nicotine dependence: Secondary | ICD-10-CM | POA: Diagnosis not present

## 2020-10-08 DIAGNOSIS — Z01812 Encounter for preprocedural laboratory examination: Secondary | ICD-10-CM | POA: Insufficient documentation

## 2020-10-08 LAB — BASIC METABOLIC PANEL
Anion gap: 8 (ref 5–15)
BUN: 15 mg/dL (ref 8–23)
CO2: 25 mmol/L (ref 22–32)
Calcium: 9.9 mg/dL (ref 8.9–10.3)
Chloride: 107 mmol/L (ref 98–111)
Creatinine, Ser: 0.54 mg/dL (ref 0.44–1.00)
GFR, Estimated: >60 mL/min (ref >60)
Glucose, Bld: 106 mg/dL — ABNORMAL HIGH (ref 70–99)
Potassium: 4.8 mmol/L (ref 3.5–5.1)
Sodium: 140 mmol/L (ref 135–145)

## 2020-10-08 LAB — SARS CORONAVIRUS 2 (TAT 6-24 HRS): SARS Coronavirus 2: NEGATIVE

## 2020-10-08 NOTE — Progress Notes (Signed)
Anesthesia consult per Dr. Elgie Congo. Will proceed with surgery as scheduled.

## 2020-10-08 NOTE — Progress Notes (Signed)

## 2020-10-10 ENCOUNTER — Ambulatory Visit (HOSPITAL_BASED_OUTPATIENT_CLINIC_OR_DEPARTMENT_OTHER): Payer: 59 | Admitting: Certified Registered"

## 2020-10-10 ENCOUNTER — Ambulatory Visit (HOSPITAL_BASED_OUTPATIENT_CLINIC_OR_DEPARTMENT_OTHER)
Admission: RE | Admit: 2020-10-10 | Discharge: 2020-10-10 | Disposition: A | Discharge status: Home / Self Care | Payer: 59 | Attending: Orthopedic Surgery | Admitting: Orthopedic Surgery

## 2020-10-10 ENCOUNTER — Encounter (HOSPITAL_BASED_OUTPATIENT_CLINIC_OR_DEPARTMENT_OTHER): Payer: Self-pay | Admitting: Orthopedic Surgery

## 2020-10-10 ENCOUNTER — Encounter (HOSPITAL_BASED_OUTPATIENT_CLINIC_OR_DEPARTMENT_OTHER)
Admission: RE | Disposition: A | Discharge status: Home / Self Care | Payer: Self-pay | Source: Home / Self Care | Attending: Orthopedic Surgery

## 2020-10-10 ENCOUNTER — Other Ambulatory Visit: Payer: Self-pay

## 2020-10-10 DIAGNOSIS — Z794 Long term (current) use of insulin: Secondary | ICD-10-CM | POA: Insufficient documentation

## 2020-10-10 DIAGNOSIS — Z87891 Personal history of nicotine dependence: Secondary | ICD-10-CM | POA: Insufficient documentation

## 2020-10-10 DIAGNOSIS — Z7982 Long term (current) use of aspirin: Secondary | ICD-10-CM | POA: Insufficient documentation

## 2020-10-10 DIAGNOSIS — Z96643 Presence of artificial hip joint, bilateral: Secondary | ICD-10-CM | POA: Insufficient documentation

## 2020-10-10 DIAGNOSIS — Z7984 Long term (current) use of oral hypoglycemic drugs: Secondary | ICD-10-CM | POA: Insufficient documentation

## 2020-10-10 DIAGNOSIS — M67873 Other specified disorders of tendon, right ankle and foot: Secondary | ICD-10-CM | POA: Diagnosis not present

## 2020-10-10 DIAGNOSIS — M6701 Short Achilles tendon (acquired), right ankle: Secondary | ICD-10-CM | POA: Insufficient documentation

## 2020-10-10 DIAGNOSIS — M19071 Primary osteoarthritis, right ankle and foot: Secondary | ICD-10-CM | POA: Insufficient documentation

## 2020-10-10 DIAGNOSIS — Z79899 Other long term (current) drug therapy: Secondary | ICD-10-CM | POA: Insufficient documentation

## 2020-10-10 HISTORY — DX: Polyneuropathy, unspecified: G62.9

## 2020-10-10 HISTORY — PX: FOOT ARTHRODESIS: SHX1655

## 2020-10-10 HISTORY — DX: Acute infarction of spinal cord (embolic) (nonembolic): G95.11

## 2020-10-10 HISTORY — PX: GASTROC RECESSION EXTREMITY: SHX6262

## 2020-10-10 LAB — GLUCOSE, CAPILLARY
Glucose-Capillary: 83 mg/dL (ref 70–99)
Glucose-Capillary: 144 mg/dL — ABNORMAL HIGH (ref 70–99)

## 2020-10-10 SURGERY — RECESSION, TENDON, GASTROCNEMIUS
Anesthesia: General | Site: Leg Lower | Laterality: Right

## 2020-10-10 MED ORDER — OXYCODONE HCL 5 MG PO TABS
5.0000 mg | ORAL_TABLET | ORAL | 0 refills | Status: AC | PRN
Start: 2020-10-10 — End: 2020-10-15

## 2020-10-10 MED ORDER — KETOROLAC TROMETHAMINE 30 MG/ML IJ SOLN: 30.0000 mg | Freq: Once | INTRAMUSCULAR | Status: DC | PRN

## 2020-10-10 MED ORDER — ACETAMINOPHEN 325 MG PO TABS: 325.0000 mg | ORAL_TABLET | ORAL | Status: DC | PRN

## 2020-10-10 MED ORDER — ONDANSETRON HCL 4 MG/2ML IJ SOLN
INTRAMUSCULAR | Status: DC | PRN
  Administered 2020-10-10: 4 mg via INTRAVENOUS

## 2020-10-10 MED ORDER — DEXAMETHASONE SODIUM PHOSPHATE 10 MG/ML IJ SOLN
INTRAMUSCULAR | Status: DC | PRN
  Administered 2020-10-10: 4 mg via INTRAVENOUS

## 2020-10-10 MED ORDER — SODIUM CHLORIDE 0.9 % IV SOLN
INTRAVENOUS | Status: DC
Start: 2020-10-10 — End: 2020-10-10

## 2020-10-10 MED ORDER — FENTANYL CITRATE (PF) 100 MCG/2ML IJ SOLN
25.0000 ug | INTRAMUSCULAR | Status: DC | PRN
Start: 2020-10-10 — End: 2020-10-10

## 2020-10-10 MED ORDER — ONDANSETRON HCL 4 MG/2ML IJ SOLN: 4.0000 mg | Freq: Once | INTRAMUSCULAR | Status: DC | PRN

## 2020-10-10 MED ORDER — PROPOFOL 10 MG/ML IV BOLUS
INTRAVENOUS | Status: DC | PRN
  Administered 2020-10-10: 200 mg via INTRAVENOUS

## 2020-10-10 MED ORDER — RIVAROXABAN 10 MG PO TABS: 10.0000 mg | ORAL_TABLET | Freq: Every day | ORAL | 0 refills | Status: DC

## 2020-10-10 MED ORDER — SENNA 8.6 MG PO TABS: 2.0000 | ORAL_TABLET | Freq: Two times a day (BID) | ORAL | 0 refills | Status: DC

## 2020-10-10 MED ORDER — MEPERIDINE HCL 25 MG/ML IJ SOLN: 6.2500 mg | INTRAMUSCULAR | Status: DC | PRN

## 2020-10-10 MED ORDER — CEFAZOLIN SODIUM-DEXTROSE 2-4 GM/100ML-% IV SOLN
2.0000 g | INTRAVENOUS | Status: AC
  Administered 2020-10-10: 3 g via INTRAVENOUS

## 2020-10-10 MED ORDER — VANCOMYCIN HCL 500 MG IV SOLR
INTRAVENOUS | Status: DC | PRN
  Administered 2020-10-10: 500 mg via TOPICAL

## 2020-10-10 MED ORDER — ACETAMINOPHEN 160 MG/5ML PO SOLN: 325.0000 mg | ORAL | Status: DC | PRN

## 2020-10-10 MED ORDER — FENTANYL CITRATE (PF) 100 MCG/2ML IJ SOLN
INTRAMUSCULAR | Status: AC
  Filled 2020-10-10: qty 2

## 2020-10-10 MED ORDER — PHENYLEPHRINE HCL (PRESSORS) 10 MG/ML IV SOLN
INTRAVENOUS | Status: DC | PRN
  Administered 2020-10-10: 40 ug via INTRAVENOUS
  Administered 2020-10-10: 80 ug via INTRAVENOUS

## 2020-10-10 MED ORDER — ROPIVACAINE HCL 5 MG/ML IJ SOLN
INTRAMUSCULAR | Status: DC | PRN
Start: 2020-10-10 — End: 2020-10-10
  Administered 2020-10-10 (×6): 5 mL via PERINEURAL

## 2020-10-10 MED ORDER — FENTANYL CITRATE (PF) 100 MCG/2ML IJ SOLN
INTRAMUSCULAR | Status: DC | PRN
  Administered 2020-10-10: 25 ug via INTRAVENOUS
  Administered 2020-10-10: 50 ug via INTRAVENOUS

## 2020-10-10 MED ORDER — CEFAZOLIN SODIUM-DEXTROSE 2-4 GM/100ML-% IV SOLN
INTRAVENOUS | Status: AC
  Filled 2020-10-10: qty 100

## 2020-10-10 MED ORDER — OXYCODONE HCL 5 MG/5ML PO SOLN: 5.0000 mg | Freq: Once | ORAL | Status: DC | PRN

## 2020-10-10 MED ORDER — PROPOFOL 500 MG/50ML IV EMUL
INTRAVENOUS | Status: DC | PRN
Start: 2020-10-10 — End: 2020-10-10
  Administered 2020-10-10: 25 ug/kg/min via INTRAVENOUS

## 2020-10-10 MED ORDER — MIDAZOLAM HCL 2 MG/2ML IJ SOLN
2.0000 mg | Freq: Once | INTRAMUSCULAR | Status: AC
  Administered 2020-10-10: 2 mg via INTRAVENOUS

## 2020-10-10 MED ORDER — FENTANYL CITRATE (PF) 100 MCG/2ML IJ SOLN
100.0000 ug | Freq: Once | INTRAMUSCULAR | Status: AC
  Administered 2020-10-10: 100 ug via INTRAVENOUS

## 2020-10-10 MED ORDER — LACTATED RINGERS IV SOLN: INTRAVENOUS | Status: DC

## 2020-10-10 MED ORDER — CLONIDINE HCL (ANALGESIA) 100 MCG/ML EP SOLN
EPIDURAL | Status: DC | PRN
  Administered 2020-10-10: 100 ug

## 2020-10-10 MED ORDER — ROPIVACAINE HCL 7.5 MG/ML IJ SOLN
INTRAMUSCULAR | Status: DC | PRN
  Administered 2020-10-10 (×4): 5 mL via PERINEURAL

## 2020-10-10 MED ORDER — DEXMEDETOMIDINE (PRECEDEX) IN NS 20 MCG/5ML (4 MCG/ML) IV SYRINGE
PREFILLED_SYRINGE | INTRAVENOUS | Status: DC | PRN
  Administered 2020-10-10: 8 ug via INTRAVENOUS

## 2020-10-10 MED ORDER — LIDOCAINE HCL (CARDIAC) PF 100 MG/5ML IV SOSY
PREFILLED_SYRINGE | INTRAVENOUS | Status: DC | PRN
  Administered 2020-10-10: 30 mg via INTRAVENOUS

## 2020-10-10 MED ORDER — DOCUSATE SODIUM 100 MG PO CAPS: 100.0000 mg | ORAL_CAPSULE | Freq: Two times a day (BID) | ORAL | 0 refills | Status: DC

## 2020-10-10 MED ORDER — MIDAZOLAM HCL 2 MG/2ML IJ SOLN
INTRAMUSCULAR | Status: AC
  Filled 2020-10-10: qty 2

## 2020-10-10 MED ORDER — OXYCODONE HCL 5 MG PO TABS: 5.0000 mg | ORAL_TABLET | Freq: Once | ORAL | Status: DC | PRN

## 2020-10-10 MED ORDER — CLONIDINE HCL (ANALGESIA) 100 MCG/ML EP SOLN
EPIDURAL | Status: DC | PRN
  Administered 2020-10-10 (×2): 100 ug

## 2020-10-10 MED ORDER — DEXAMETHASONE SODIUM PHOSPHATE 10 MG/ML IJ SOLN
INTRAMUSCULAR | Status: DC | PRN
  Administered 2020-10-10 (×2): 10 mg

## 2020-10-10 MED ORDER — 0.9 % SODIUM CHLORIDE (POUR BTL) OPTIME
TOPICAL | Status: DC | PRN
  Administered 2020-10-10: 200 mL

## 2020-10-10 MED ORDER — VANCOMYCIN HCL 500 MG IV SOLR
INTRAVENOUS | Status: AC
  Filled 2020-10-10: qty 500

## 2020-10-10 SURGICAL SUPPLY — 79 items
APL PRP STRL LF DISP 70% ISPRP (MISCELLANEOUS) ×6
BANDAGE ESMARK 6X9 LF (GAUZE/BANDAGES/DRESSINGS) IMPLANT
BIT DRILL 4.8X200 CANN (BIT) ×2 IMPLANT
BIT DRILL 7/64X5 DISP (BIT) ×2 IMPLANT
BIT DRILL CANN 3.5MM (DRILL) ×1 IMPLANT
BIT TREPHINE CORING 8 (BIT) IMPLANT
BLADE AVERAGE 25X9 (BLADE) IMPLANT
BLADE MICRO SAGITTAL (BLADE) IMPLANT
BLADE SURG 15 STRL LF DISP TIS (BLADE) ×9 IMPLANT
BLADE SURG 15 STRL SS (BLADE) ×20
BNDG CMPR 9X6 STRL LF SNTH (GAUZE/BANDAGES/DRESSINGS)
BNDG COHESIVE 4X5 TAN STRL (GAUZE/BANDAGES/DRESSINGS) ×4 IMPLANT
BNDG COHESIVE 6X5 TAN STRL LF (GAUZE/BANDAGES/DRESSINGS) ×4 IMPLANT
BNDG ESMARK 6X9 LF (GAUZE/BANDAGES/DRESSINGS)
CHLORAPREP W/TINT 26 (MISCELLANEOUS) ×6 IMPLANT
COVER BACK TABLE 60X90IN (DRAPES) ×4 IMPLANT
COVER WAND RF STERILE (DRAPES) IMPLANT
CUFF TOURN SGL QUICK 34 (TOURNIQUET CUFF)
CUFF TOURN SGL QUICK 42 (TOURNIQUET CUFF) ×2 IMPLANT
CUFF TRNQT CYL 34X4.125X (TOURNIQUET CUFF) IMPLANT
DRAPE EXTREMITY T 121X128X90 (DISPOSABLE) ×4 IMPLANT
DRAPE OEC MINIVIEW 54X84 (DRAPES) ×4 IMPLANT
DRAPE U-SHAPE 47X51 STRL (DRAPES) ×4 IMPLANT
DRILL CANN 3.5MM (DRILL) ×4
DRSG MEPITEL 4X7.2 (GAUZE/BANDAGES/DRESSINGS) ×4 IMPLANT
DRSG PAD ABDOMINAL 8X10 ST (GAUZE/BANDAGES/DRESSINGS) ×8 IMPLANT
ELECT REM PT RETURN 9FT ADLT (ELECTROSURGICAL) ×4
ELECTRODE REM PT RTRN 9FT ADLT (ELECTROSURGICAL) ×3 IMPLANT
GAUZE SPONGE 4X4 12PLY STRL (GAUZE/BANDAGES/DRESSINGS) ×4 IMPLANT
GLOVE ECLIPSE 8.0 STRL XLNG CF (GLOVE) ×4 IMPLANT
GLOVE SRG 8 PF TXTR STRL LF DI (GLOVE) ×7 IMPLANT
GLOVE SURG ENC MOIS LTX SZ8 (GLOVE) ×4 IMPLANT
GLOVE SURG SS PI 7.0 STRL IVOR (GLOVE) ×2 IMPLANT
GLOVE SURG UNDER POLY LF SZ7 (GLOVE) ×2 IMPLANT
GLOVE SURG UNDER POLY LF SZ8 (GLOVE) ×12
GOWN STRL REUS W/ TWL LRG LVL3 (GOWN DISPOSABLE) ×3 IMPLANT
GOWN STRL REUS W/ TWL XL LVL3 (GOWN DISPOSABLE) ×6 IMPLANT
GOWN STRL REUS W/TWL LRG LVL3 (GOWN DISPOSABLE) ×4
GOWN STRL REUS W/TWL XL LVL3 (GOWN DISPOSABLE) ×8
K-WIRE 1.8 (WIRE) ×4
K-WIRE FX200X1.8XTROC TIP (WIRE) ×3
KIT ARCUS SIZING TEMPLATE STRL (KITS) IMPLANT
KIT STAPLE ARCUS 16X13 STRL (Staple) ×2 IMPLANT
KWIRE FX200X1.8XTROC TIP (WIRE) ×1 IMPLANT
LOOP VESSEL MAXI BLUE (MISCELLANEOUS) IMPLANT
LOOP VESSEL MINI RED (MISCELLANEOUS) IMPLANT
NDL SAFETY ECLIPSE 18X1.5 (NEEDLE) IMPLANT
NEEDLE HYPO 18GX1.5 SHARP (NEEDLE)
NEEDLE HYPO 22GX1.5 SAFETY (NEEDLE) ×4 IMPLANT
PACK BASIN DAY SURGERY FS (CUSTOM PROCEDURE TRAY) ×4 IMPLANT
PAD CAST 4YDX4 CTTN HI CHSV (CAST SUPPLIES) ×3 IMPLANT
PADDING CAST COTTON 4X4 STRL (CAST SUPPLIES) ×4
PADDING CAST COTTON 6X4 STRL (CAST SUPPLIES) ×4 IMPLANT
PENCIL SMOKE EVACUATOR (MISCELLANEOUS) ×4 IMPLANT
PIN GUIDE DRILL TIP 2.8X300 (DRILL) ×2 IMPLANT
PUTTY DBM STAGRAFT 2CC (Putty) ×2 IMPLANT
SANITIZER HAND PURELL 535ML FO (MISCELLANEOUS) ×4 IMPLANT
SCREW CANN 5.0X30MM (Screw) ×2 IMPLANT
SCREW CANNULATED 6.5X50MM (Screw) ×2 IMPLANT
SCREW CANNULATED 6.5X65 KNEE (Screw) ×2 IMPLANT
SHEET MEDIUM DRAPE 40X70 STRL (DRAPES) ×6 IMPLANT
SLEEVE SCD COMPRESS KNEE MED (MISCELLANEOUS) ×4 IMPLANT
SPLINT FAST PLASTER 5X30 (CAST SUPPLIES) ×20
SPLINT PLASTER CAST FAST 5X30 (CAST SUPPLIES) ×60 IMPLANT
SPONGE LAP 18X18 RF (DISPOSABLE) ×4 IMPLANT
SPONGE SURGIFOAM ABS GEL 12-7 (HEMOSTASIS) IMPLANT
STOCKINETTE 6  STRL (DRAPES) ×4
STOCKINETTE 6 STRL (DRAPES) ×3 IMPLANT
SUCTION FRAZIER HANDLE 10FR (MISCELLANEOUS) ×4
SUCTION TUBE FRAZIER 10FR DISP (MISCELLANEOUS) ×3 IMPLANT
SUT ETHILON 3 0 PS 1 (SUTURE) ×6 IMPLANT
SUT MNCRL AB 3-0 PS2 18 (SUTURE) ×4 IMPLANT
SUT VIC AB 0 SH 27 (SUTURE) IMPLANT
SUT VIC AB 2-0 SH 27 (SUTURE) ×4
SUT VIC AB 2-0 SH 27XBRD (SUTURE) ×3 IMPLANT
SYR BULB EAR ULCER 3OZ GRN STR (SYRINGE) ×4 IMPLANT
TOWEL GREEN STERILE FF (TOWEL DISPOSABLE) ×8 IMPLANT
TUBE CONNECTING 20X1/4 (TUBING) ×4 IMPLANT
UNDERPAD 30X36 HEAVY ABSORB (UNDERPADS AND DIAPERS) ×4 IMPLANT

## 2020-10-10 NOTE — Anesthesia Procedure Notes (Signed)
Procedure Name: LMA Insertion Date/Time: 10/10/2020 10:26 AM Performed by: Signe Colt, CRNA Pre-anesthesia Checklist: Patient identified, Emergency Drugs available, Suction available and Patient being monitored Patient Re-evaluated:Patient Re-evaluated prior to induction Oxygen Delivery Method: Circle System Utilized Preoxygenation: Pre-oxygenation with 100% oxygen Induction Type: IV induction Ventilation: Mask ventilation without difficulty LMA: LMA inserted LMA Size: 4.0 Number of attempts: 1 Airway Equipment and Method: bite block Placement Confirmation: positive ETCO2 Tube secured with: Tape Dental Injury: Teeth and Oropharynx as per pre-operative assessment

## 2020-10-10 NOTE — Anesthesia Preprocedure Evaluation (Addendum)
Anesthesia Evaluation  Patient identified by MRN, date of birth, ID band Patient awake    Reviewed: Allergy & Precautions, NPO status , Patient's Chart, lab work & pertinent test results  Airway Mallampati: II       Dental no notable dental hx.    Pulmonary sleep apnea and Continuous Positive Airway Pressure Ventilation , former smoker,    Pulmonary exam normal        Cardiovascular hypertension, Pt. on medications Normal cardiovascular exam     Neuro/Psych  Neuromuscular disease negative psych ROS   GI/Hepatic   Endo/Other  diabetes, Type 2, Insulin Dependent, Oral Hypoglycemic AgentsMorbid obesity  Renal/GU   negative genitourinary   Musculoskeletal   Abdominal (+) + obese,   Peds  Hematology   Anesthesia Other Findings   Reproductive/Obstetrics                            Anesthesia Physical Anesthesia Plan  ASA: III  Anesthesia Plan: General   Post-op Pain Management:  Regional for Post-op pain   Induction: Intravenous  PONV Risk Score and Plan: 3 and Ondansetron and Midazolam  Airway Management Planned: LMA  Additional Equipment: None  Intra-op Plan:   Post-operative Plan: Extubation in OR  Informed Consent: I have reviewed the patients History and Physical, chart, labs and discussed the procedure including the risks, benefits and alternatives for the proposed anesthesia with the patient or authorized representative who has indicated his/her understanding and acceptance.     Dental advisory given  Plan Discussed with: CRNA  Anesthesia Plan Comments:         Anesthesia Quick Evaluation

## 2020-10-10 NOTE — Anesthesia Procedure Notes (Addendum)
Anesthesia Regional Block: Popliteal block   Pre-Anesthetic Checklist: ,, timeout performed, Correct Patient, Correct Site, Correct Laterality, Correct Procedure, Correct Position, site marked, Risks and benefits discussed,  Surgical consent,  Pre-op evaluation,  At surgeon's request and post-op pain management  Laterality: Lower and Right  Prep: chloraprep       Needles:  Injection technique: Single-shot  Needle Type: Echogenic Stimulator Needle     Needle Length: 9cm  Needle Gauge: 20   Needle insertion depth: 2.5 cm   Additional Needles:   Procedures:,,,, ultrasound used (permanent image in chart),,,,  Narrative:  Start time: 10/10/2020 9:10 AM End time: 10/10/2020 9:17 AM Injection made incrementally with aspirations every 5 mL.  Performed by: Personally  Anesthesiologist: Lyn Hollingshead, MD

## 2020-10-10 NOTE — Op Note (Signed)
10/10/2020  12:24 PM  PATIENT:  Laura Blackwell  65 y.o. female  PRE-OPERATIVE DIAGNOSIS: 1.  Right stage III posterior tibial tendon dysfunction with talonavicular and subtalar joint arthritis 2.  Short right Achilles tendon  POST-OPERATIVE DIAGNOSIS: Same  Procedure(s): 1.  Right gastrocnemius recession 2.  Right subtalar joint arthrodesis 3.  Right talonavicular joint arthrodesis 4.  AP, lateral and Harris heel radiographs of the right foot  SURGEON:  Wylene Simmer, MD  ASSISTANT: Mechele Claude, PA-C  ANESTHESIA:   General, regional  EBL:  minimal   TOURNIQUET:   Total Tourniquet Time Documented: Thigh (Right) - 84 minutes Total: Thigh (Right) - 84 minutes  COMPLICATIONS:  None apparent  DISPOSITION:  Extubated, awake and stable to recovery.  INDICATION FOR PROCEDURE: The patient is a 65 year old female with a past medical history significant for diabetes.  She has a long history of right forefoot pain and has stage III posterior tibial tendon dysfunction with arthritis at both the talonavicular and subtalar joints.  She also has a short Achilles tendon.  She has failed nonoperative treatment to date including activity modification, oral anti-inflammatories, bracing and physical therapy.  She presents now for operative treatment of these painful and limiting right foot and ankle conditions.  The risks and benefits of the alternative treatment options have been discussed in detail.  The patient wishes to proceed with surgery and specifically understands risks of bleeding, infection, nerve damage, blood clots, need for additional surgery, amputation and death.  PROCEDURE IN DETAIL:  After pre operative consent was obtained, and the correct operative site was identified, the patient was brought to the operating room and placed supine on the OR table.  Anesthesia was administered.  Pre-operative antibiotics were administered.  A surgical timeout was taken.  The right lower  extremity was prepped and draped in standard sterile fashion.  The right lower extremity was elevated and the tourniquet inflated to 350 mmHg.  A longitudinal incision was made at the medial calf.  Dissection was carried down through the subcutaneous tissues.  The superficial fascia was incised.  The gastrocnemius and plantaris tendons were identified.  Both were divided under direct vision from medial to lateral taking care to protect the sural nerve posteriorly.  The wound was irrigated and sprinkled with vancomycin powder.  The wound was then closed with Monocryl and nylon.  Attention was turned to the lateral aspect of the hindfoot.  A curvilinear incision was made over the sinus Tarsi.  Dissection was carried down through the subcutaneous tissues.  The subtalar joint was entered and the posterior facet visualized.  Remaining articular cartilage and subchondral bone was removed with curettes and a rondure.  The wound was irrigated copiously.  A small drill bit was used to perforate both sides of the joint leaving the resultant bone graft in place.  A small curved osteotome was used to further break up the remaining subchondral bone.  Attention was turned to the dorsum of the hindfoot.  A longitudinal incision was made.  Dissection was carried down to the subcutaneous space tissues.  The interval between the tibialis anterior and extensor houses longus was developed.  Care was taken to protect the neurovascular bundle.  The talonavicular joint was identified.  The joint capsule was elevated medially and laterally.  The remaining articular cartilage and subchondral bone was removed from both sides of the joint.  The wound was irrigated copiously.  A small drill bit was used to perforate both sides of the joint  leaving the resultant bone graft in place.  The subchondral bone was further broken up with 1/4 inch curved osteotome.  The subtalar joint was reduced.  A guidepin for the [employer] 6.5 mm  cannulated screws was advanced across the joint from the calcaneal tuberosity into the lateral aspect of the talar dome.  Radiographs confirmed appropriate reduction of the subtalar joint and appropriate position of the guidepin.  The guidepin was overdrilled.  A partially-threaded 6.5 millimeter screw was inserted and was noted to have excellent purchase and compressed the joint appropriately.  Attention was then turned to the dorsal incision where a guidepin was inserted from the neck of the talus across the subtalar joint to the anterior process of the calcaneus.  Radiographs confirmed appropriate position of this guidepin.  It was overdrilled and a fully threaded 6.5 millimeter screw inserted.  It was noted to have excellent purchase.  The talonavicular joint was then reduced.  A guidepin was placed medially across the talonavicular joint.  Radiographs confirmed appropriate position of the pin.  It was overdrilled.  A 5 mm partially-threaded screw with a washer was inserted.  This was tightened and had excellent purchase.  It compressed the talonavicular joint appropriately.  A 16 mm arcus staple was placed across the talonavicular joint laterally.  Final AP, lateral and Harris heel radiographs confirmed appropriate reduction of the talonavicular and subtalar joints in appropriate position and length of all hardware.  DBM and cancellous chips were packed into the lateral incongruity at the talonavicular joint.  Vancomycin powder were sprinkled in the wound after irrigating copiously.  Subcutaneous tissues were approximated with Vicryl and Monocryl.  Skin incisions were closed with nylon.  Sterile dressings were applied followed by a well-padded short leg splint.  The tourniquet was released after application of the dressings.  The patient was awakened from anesthesia and transported to the recovery room in stable condition.   FOLLOW UP PLAN: Nonweightbearing on the right lower extremity.  Follow-up in the  office in 2 weeks for suture removal and conversion to a short leg cast.  Plan 6 weeks of nonweightbearing immobilization.   RADIOGRAPHS: AP, lateral and Harris heel radiographs of the right foot were obtained intraoperatively.  These show interval arthrodesis of the talonavicular and subtalar joints.  Hardware is appropriately positioned and of the appropriate lengths.  No other acute injuries are noted.    Mechele Claude PA-C was present and scrubbed for the duration of the operative case. His assistance was essential in positioning the patient, prepping and draping, gaining and maintaining exposure, performing the operation, closing and dressing the wounds and applying the splint.

## 2020-10-10 NOTE — Anesthesia Procedure Notes (Signed)
Anesthesia Regional Block: Adductor canal block   Pre-Anesthetic Checklist: ,, timeout performed, Correct Patient, Correct Site, Correct Laterality, Correct Procedure, Correct Position, site marked, Risks and benefits discussed,  Surgical consent,  Pre-op evaluation,  At surgeon's request and post-op pain management  Laterality: Lower and Right  Prep: chloraprep       Needles:  Injection technique: Single-shot  Needle Type: Echogenic Stimulator Needle     Needle Length: 9cm  Needle Gauge: 20   Needle insertion depth: 4 cm   Additional Needles:   Procedures:,,,, ultrasound used (permanent image in chart),,,,  Narrative:  Start time: 10/10/2020 9:00 AM End time: 10/10/2020 9:08 AM Injection made incrementally with aspirations every 5 mL. Anesthesiologist: Lyn Hollingshead, MD

## 2020-10-10 NOTE — Anesthesia Postprocedure Evaluation (Signed)
Anesthesia Post Note  Patient: Laura Blackwell  Procedure(s) Performed: Gastroc recession (Right Leg Lower) Right talonavicular and subtalar joint arthrodesis (Right Foot)     Patient location during evaluation: Phase II Anesthesia Type: General Level of consciousness: awake Pain management: pain level controlled Vital Signs Assessment: post-procedure vital signs reviewed and stable Respiratory status: spontaneous breathing Cardiovascular status: stable Postop Assessment: no apparent nausea or vomiting Anesthetic complications: no   No complications documented.  Last Vitals:  Vitals:   10/10/20 1230 10/10/20 1234  BP: 130/89 122/73  Pulse: 98 91  Resp: 14 19  Temp:    SpO2: 96% 99%    Last Pain:  Vitals:   10/10/20 1242  TempSrc:   PainSc: 0-No pain        RLE Motor Response: Purposeful movement;No tremor (10/10/20 1242) RLE Sensation: Numbness;No pain;No tingling;Decreased (10/10/20 1242)      Huston Foley

## 2020-10-10 NOTE — Transfer of Care (Signed)
Immediate Anesthesia Transfer of Care Note  Patient: Laura Blackwell  Procedure(s) Performed: Gastroc recession (Right Leg Lower) Right talonavicular and subtalar joint arthrodesis (Right Foot)  Patient Location: PACU  Anesthesia Type:GA combined with regional for post-op pain  Level of Consciousness: drowsy and patient cooperative  Airway & Oxygen Therapy: Patient Spontanous Breathing and Patient connected to face mask oxygen  Post-op Assessment: Report given to RN and Post -op Vital signs reviewed and stable  Post vital signs: Reviewed and stable  Last Vitals:  Vitals Value Taken Time  BP 124/77 10/10/20 1217  Temp    Pulse 91 10/10/20 1218  Resp 13 10/10/20 1218  SpO2 100 % 10/10/20 1218  Vitals shown include unvalidated device data.  Last Pain:  Vitals:   10/10/20 0843  TempSrc: Oral  PainSc: 0-No pain      Patients Stated Pain Goal: 5 (30/05/11 0211)  Complications: No complications documented.

## 2020-10-10 NOTE — Addendum Note (Signed)
Addendum  created 10/10/20 1600 by Lyn Hollingshead, MD   Child order released for a procedure order, Clinical Note Signed, Image imported, Intraprocedure Blocks edited, Intraprocedure Meds edited

## 2020-10-10 NOTE — Discharge Instructions (Addendum)
Wylene Simmer, MD [facility]  Please read the following information regarding your care after surgery.  Medications  You only need a prescription for the narcotic pain medicine (ex. oxycodone, Percocet, Norco).  All of the other medicines listed below are available over the counter. X Aleve 2 pills twice a day for the first 3 days after surgery. X acetominophen (Tylenol) 650 mg every 4-6 hours as you need for minor to moderate pain X oxycodone as prescribed for severe pain  Narcotic pain medicine (ex. oxycodone, Percocet, Vicodin) will cause constipation.  To prevent this problem, take the following medicines while you are taking any pain medicine. X docusate sodium (Colace) 100 mg twice a day      X senna (Senokot) 2 tablets twice a day  X To help prevent blood clots, take Xarelto as prescribed for two weeks after surgery.  Start Xarelto the day after surgery.  You should also get up every hour while you are awake to move around.    Weight Bearing X Do not bear any weight on the operated leg or foot.  Cast / Splint / Dressing X Keep your splint, cast or dressing clean and dry.  Dont put anything (coat hanger, pencil, etc) down inside of it.  If it gets damp, use a hair dryer on the cool setting to dry it.  If it gets soaked, call the office to schedule an appointment for a cast change.   After your dressing, cast or splint is removed; you may shower, but do not soak or scrub the wound.  Allow the water to run over it, and then gently pat it dry.  Swelling It is normal for you to have swelling where you had surgery.  To reduce swelling and pain, keep your toes above your nose for at least 3 days after surgery.  It may be necessary to keep your foot or leg elevated for several weeks.  If it hurts, it should be elevated.  Follow Up Call my office at [phone] when you are discharged from the hospital or surgery center to schedule an appointment to be seen two weeks after  surgery.  Call my office at [phone] if you develop a fever >101.5 F, nausea, vomiting, bleeding from the surgical site or severe pain.     Post Anesthesia Home Care Instructions  Activity: Get plenty of rest for the remainder of the day. A responsible individual must stay with you for 24 hours following the procedure.  For the next 24 hours, DO NOT: -Drive a car -Paediatric nurse -Drink alcoholic beverages -Take any medication unless instructed by your physician -Make any legal decisions or sign important papers.  Meals: Start with liquid foods such as gelatin or soup. Progress to regular foods as tolerated. Avoid greasy, spicy, heavy foods. If nausea and/or vomiting occur, drink only clear liquids until the nausea and/or vomiting subsides. Call your physician if vomiting continues.  Special Instructions/Symptoms: Your throat may feel dry or sore from the anesthesia or the breathing tube placed in your throat during surgery. If this causes discomfort, gargle with warm salt water. The discomfort should disappear within 24 hours.  If you had a scopolamine patch placed behind your ear for the management of post- operative nausea and/or vomiting:  1. The medication in the patch is effective for 72 hours, after which it should be removed.  Wrap patch in a tissue and discard in the trash. Wash hands thoroughly with soap and water. 2. You may remove the patch  earlier than 72 hours if you experience unpleasant side effects which may include dry mouth, dizziness or visual disturbances. 3. Avoid touching the patch. Wash your hands with soap and water after contact with the patch.    Regional Anesthesia Blocks  1. Numbness or the inability to move the "blocked" extremity may last from 3-48 hours after placement. The length of time depends on the medication injected and your individual response to the medication. If the numbness is not going away after 48 hours, call your surgeon.  2. The  extremity that is blocked will need to be protected until the numbness is gone and the  Strength has returned. Because you cannot feel it, you will need to take extra care to avoid injury. Because it may be weak, you may have difficulty moving it or using it. You may not know what position it is in without looking at it while the block is in effect.  3. For blocks in the legs and feet, returning to weight bearing and walking needs to be done carefully. You will need to wait until the numbness is entirely gone and the strength has returned. You should be able to move your leg and foot normally before you try and bear weight or walk. You will need someone to be with you when you first try to ensure you do not fall and possibly risk injury.  4. Bruising and tenderness at the needle site are common side effects and will resolve in a few days.  5. Persistent numbness or new problems with movement should be communicated to the surgeon or the [place] [phone] [place] [phone]).

## 2020-10-10 NOTE — H&P (Signed)
Laura Blackwell is an 65 y.o. female.   Chief Complaint: Right foot pain HPI: The patient is a 65 year old female with a past medical history significant for diabetes.  She has a long history of right foot pain and has developed stage III posterior tibial tendon dysfunction.  She has failed nonoperative treatment to date and presents now for right talonavicular and subtalar joint arthrodesis as well as gastrocnemius recession.  Past Medical History:  Diagnosis Date  . Arthritis   . Diabetes mellitus without complication ([place])   . Edema    in left leg in 1980s on left ankle   . Hyperlipidemia   . Hypertension   . Neuropathy   . PCOS (polycystic ovarian syndrome)   . Spinal cord stroke ([place])    01/2019  . Superficial thrombophlebitis   . Swelling   . Venous insufficiency   . Vitamin D deficiency     Past Surgical History:  Procedure Laterality Date  . BREAST BIOPSY Right   . JOINT REPLACEMENT  2011    right hip   . left ankle surgery     . TOTAL HIP ARTHROPLASTY Left 12/02/2015   Procedure: LEFT TOTAL HIP ARTHROPLASTY ANTERIOR APPROACH;  Surgeon: [name], MD;  Location: [facility];  Service: Orthopedics;  Laterality: Left;    Family History  Problem Relation Age of Onset  . Diabetes Mother   . Hypertension Mother    Social History:  reports that she quit smoking about 18 years ago. She has never used smokeless tobacco. She reports that she does not drink alcohol and does not use drugs.  Allergies:  Allergies  Allergen Reactions  . Jardiance [Empagliflozin]     Yeast infection    Medications Prior to Admission  Medication Sig Dispense Refill  . amLODipine (NORVASC) 5 MG tablet Take 5 mg by mouth daily.     Marland Kitchen aspirin EC 81 MG tablet Take 81 mg by mouth daily.    Marland Kitchen atorvastatin (LIPITOR) 20 MG tablet Take 20 mg by mouth daily.     . Cholecalciferol 125 MCG (5000 UT) capsule Take 5,000 Units by mouth every Monday, Wednesday, and Friday.    . Cyanocobalamin (VITAMIN  B-12 PO) Take 1 capsule by mouth every Monday, Wednesday, and Friday.    . furosemide (LASIX) 20 MG tablet Take 20 mg by mouth 2 (two) times daily.     Marland Kitchen gabapentin (NEURONTIN) 300 MG capsule One po qAm, one po q evening and one po qHS 90 capsule 11  . glimepiride (AMARYL) 4 MG tablet Take 1 tablet (4 mg total) by mouth 2 (two) times a day for 30 days. 60 tablet 0  . glucose blood test strip 1 each by Other route 2 (two) times daily. Use as instructed    . hyoscyamine (LEVBID) 0.375 MG 12 hr tablet Take 1 tablet (0.375 mg total) by mouth 2 (two) times daily. 60 tablet 5  . Insulin Pen Needle (BD PEN NEEDLE NANO U/F) 32G X 4 MM MISC 1 each by Does not apply route daily. 100 each 0  . losartan (COZAAR) 100 MG tablet Take 100 mg by mouth every morning.     . metFORMIN (GLUCOPHAGE) 500 MG tablet Take 1,000 mg by mouth 2 (two) times daily with a meal.     . SOLIQUA 100-33 UNT-MCG/ML SOPN Inject 32 Units into the skin daily before breakfast.   2    Results for orders placed or performed during the hospital encounter of 10/10/20 (from  the past 48 hour(s))  Basic metabolic panel per protocol     Status: Abnormal   Collection Time: 10/08/20 12:30 PM  Result Value Ref Range   Sodium 140 135 - 145 mmol/L   Potassium 4.8 3.5 - 5.1 mmol/L   Chloride 107 98 - 111 mmol/L   CO2 25 22 - 32 mmol/L   Glucose, Bld 106 (H) 70 - 99 mg/dL    Comment: Glucose reference range applies only to samples taken after fasting for at least 8 hours.   BUN 15 8 - 23 mg/dL   Creatinine, Ser 0.54 0.44 - 1.00 mg/dL   Calcium 9.9 8.9 - 10.3 mg/dL   GFR, Estimated >60 >60 mL/min    Comment: (NOTE) Calculated using the CKD-EPI Creatinine Equation (2021)    Anion gap 8 5 - 15    Comment: Performed at [facility] [address] [place], [place] [address]  Glucose, capillary     Status: None   Collection Time: Oct 31, 2020  8:47 AM  Result Value Ref Range   Glucose-Capillary 83 70 - 99 mg/dL    Comment: Glucose  reference range applies only to samples taken after fasting for at least 8 hours.   No results found.  Review of Systems no recent fever, chills, nausea, vomiting or changes in her appetite  Blood pressure 130/69, pulse 86, temperature 97.7 F (36.5 C), temperature source Oral, resp. rate 12, height 5\' 6"  (1.676 m), weight 132.3 kg, SpO2 100 %. Physical Exam  Well-nourished well-developed woman in no apparent distress.  Alert and oriented x4.  Normal mood and affect.  Gait is antalgic to the right.  The right foot has some collapse of the longitudinal arch.  Decreased range of motion in inversion and eversion.  Heel cord is tight.  Pulses are palpable in the foot.  Intact sensibility to light touch dorsally and plantarly at the forefoot.   Assessment/Plan Right talonavicular and subtalar joint arthritis with a tight heel cord -to the operating room today for surgical treatment.  The risks and benefits of the alternative treatment options have been discussed in detail.  The patient wishes to proceed with surgery and specifically understands risks of bleeding, infection, nerve damage, blood clots, need for additional surgery, amputation and death.   Wylene Simmer, MD 10-31-20, 10:01 AM

## 2020-10-10 NOTE — Progress Notes (Signed)
Assisted Dr. Jillyn Hidden with right, ultrasound guided, popliteal/saphenous block. Side rails up, monitors on throughout procedure. See vital signs in flow sheet. Tolerated Procedure well.

## 2020-10-11 ENCOUNTER — Encounter (HOSPITAL_BASED_OUTPATIENT_CLINIC_OR_DEPARTMENT_OTHER): Payer: Self-pay | Admitting: Orthopedic Surgery

## 2020-10-29 ENCOUNTER — Other Ambulatory Visit: Payer: Self-pay | Admitting: Neurology

## 2020-11-20 ENCOUNTER — Other Ambulatory Visit: Payer: Self-pay | Admitting: Neurology

## 2020-12-09 ENCOUNTER — Ambulatory Visit
Admission: RE | Admit: 2020-12-09 | Discharge: 2020-12-09 | Disposition: A | Discharge status: Home / Self Care | Payer: 59 | Source: Ambulatory Visit | Attending: Family Medicine | Admitting: Family Medicine

## 2020-12-09 ENCOUNTER — Inpatient Hospital Stay: Admission: RE | Admit: 2020-12-09 | Payer: 59 | Source: Ambulatory Visit

## 2020-12-09 ENCOUNTER — Telehealth: Payer: Self-pay | Admitting: Neurology

## 2020-12-09 ENCOUNTER — Other Ambulatory Visit: Payer: Self-pay

## 2020-12-09 DIAGNOSIS — Z1231 Encounter for screening mammogram for malignant neoplasm of breast: Secondary | ICD-10-CM

## 2020-12-09 NOTE — Telephone Encounter (Signed)
Patient called the office about cost of form relayed $50.00  Turned around time - 7-10 business days .  Drop of times for form Monday - Friday  - 8 am  - 4:30 pm.

## 2020-12-10 ENCOUNTER — Other Ambulatory Visit: Payer: Self-pay | Admitting: Family Medicine

## 2020-12-10 DIAGNOSIS — R928 Other abnormal and inconclusive findings on diagnostic imaging of breast: Secondary | ICD-10-CM

## 2020-12-11 ENCOUNTER — Ambulatory Visit
Admission: RE | Admit: 2020-12-11 | Discharge: 2020-12-11 | Disposition: A | Discharge status: Home / Self Care | Payer: 59 | Source: Ambulatory Visit | Attending: Family Medicine | Admitting: Family Medicine

## 2020-12-11 ENCOUNTER — Telehealth: Payer: Self-pay | Admitting: *Deleted

## 2020-12-11 ENCOUNTER — Other Ambulatory Visit: Payer: Self-pay

## 2020-12-11 ENCOUNTER — Other Ambulatory Visit: Payer: Self-pay | Admitting: Family Medicine

## 2020-12-11 DIAGNOSIS — R928 Other abnormal and inconclusive findings on diagnostic imaging of breast: Secondary | ICD-10-CM

## 2020-12-11 DIAGNOSIS — Z0289 Encounter for other administrative examinations: Secondary | ICD-10-CM

## 2020-12-11 DIAGNOSIS — R921 Mammographic calcification found on diagnostic imaging of breast: Secondary | ICD-10-CM

## 2020-12-11 NOTE — Telephone Encounter (Signed)
Pt [place] form on [employer].

## 2020-12-12 NOTE — Telephone Encounter (Signed)
Spoke to pt about progress [employer] form that we received.  We have not filled out.  Pt explained that initially done after visit in hosptial after 2020 (spinal infarction).  We have never filled out.  She stated they need progress on how she is.  She stated that she has bladder and bowel incontinence, feet feel cold, L > R side/leg numbness, paresthesia's.  AL/NP out of office till next week, will speak to re: completion.

## 2020-12-18 ENCOUNTER — Telehealth: Payer: Self-pay | Admitting: Family Medicine

## 2020-12-18 NOTE — Telephone Encounter (Signed)
I called [facility], Dr. Tamala Julian.  They did fill out [place] in 2020 (did not look to see what all was innvolved).  I LMVM for pt that will fill out what we can, may require FCE by PT.  Dr. Felecia Shelling is out until next week.  I completed what I could.

## 2020-12-18 NOTE — Telephone Encounter (Signed)
I would be happy to review paperwork and see if it is something we can help with. If not, we can refer her to PCP.

## 2020-12-18 NOTE — Telephone Encounter (Signed)
Pt called stating that back in 2020 her pcp did fill out the initial paperwork for her that she is needing filled out but now that she is seen by MD here she is needing the MD to fill out the forms since she is being treated here. Please advise.

## 2020-12-19 NOTE — Telephone Encounter (Signed)
To medical records.

## 2020-12-19 NOTE — Telephone Encounter (Signed)
Completed and returned

## 2020-12-20 ENCOUNTER — Ambulatory Visit
Admission: RE | Admit: 2020-12-20 | Discharge: 2020-12-20 | Disposition: A | Discharge status: Home / Self Care | Payer: 59 | Source: Ambulatory Visit | Attending: Family Medicine | Admitting: Family Medicine

## 2020-12-20 ENCOUNTER — Other Ambulatory Visit: Payer: Self-pay

## 2020-12-20 DIAGNOSIS — R921 Mammographic calcification found on diagnostic imaging of breast: Secondary | ICD-10-CM

## 2020-12-23 ENCOUNTER — Telehealth: Payer: Self-pay | Admitting: *Deleted

## 2020-12-23 DIAGNOSIS — Z0271 Encounter for disability determination: Secondary | ICD-10-CM

## 2020-12-23 NOTE — Telephone Encounter (Signed)
Pt [place] form @ front desk for p/u

## 2021-04-02 ENCOUNTER — Encounter: Payer: Self-pay | Admitting: Neurology

## 2021-04-02 ENCOUNTER — Ambulatory Visit (INDEPENDENT_AMBULATORY_CARE_PROVIDER_SITE_OTHER): Payer: 59 | Admitting: Neurology

## 2021-04-02 ENCOUNTER — Other Ambulatory Visit: Payer: Self-pay

## 2021-04-02 VITALS — BP 148/92 | HR 81 | Ht 66.0 in | Wt 265.5 lb

## 2021-04-02 DIAGNOSIS — R208 Other disturbances of skin sensation: Secondary | ICD-10-CM | POA: Diagnosis not present

## 2021-04-02 DIAGNOSIS — G959 Disease of spinal cord, unspecified: Secondary | ICD-10-CM | POA: Diagnosis not present

## 2021-04-02 DIAGNOSIS — R269 Unspecified abnormalities of gait and mobility: Secondary | ICD-10-CM

## 2021-04-02 DIAGNOSIS — R200 Anesthesia of skin: Secondary | ICD-10-CM

## 2021-04-02 MED ORDER — GABAPENTIN 300 MG PO CAPS: ORAL_CAPSULE | ORAL | 11 refills | Status: DC

## 2021-04-02 NOTE — Progress Notes (Signed)
[facility]  PATIENT: Laura Blackwell DOB: 06/07/1956  REFERRING DOCTOR OR PCP: Carol Ada, MD SOURCE: Patient, Notes from Dr. Tamala Julian, imaging and lab reports, multiple MRIs of the spine and brain personally reviewed  _________________________________   HISTORICAL  CHIEF COMPLAINT:  Chief Complaint  Patient presents with   Follow-up    RM 1, alone. Last seen 10/03/20 by AL,NP. Follow up for spinal cord lesion. No new sx. Had fall 08/2020. Had operation on right ankle and was weak at the time. Numbness returned after this. Numbness slowly going away. Having burning sensation in left leg/calf/ankle/foot, right foot since March, intermittently.     HISTORY OF PRESENT ILLNESS:  Laura Blackwell is a 65 year old woman with a spinal cord lesion.  Update 04/02/2021: She continues to have a burning sensation in the left buttock and down.   The right side is better.    Initially, her right side was worse but it has improved.  She has near-total numbness in the perineum, though there was some recovery on the right.    She takes gabapentin 300 mg bid (skips pm dose as she does not like taking med's).  It has not helped    Of note, she is able to feel more in her legs and has less numbness on the right and very slightly less numbness on the left.    She has edema in her left leg since an operation many years ago.  She usually wears compression stockings (did not today due to exam).    She can walk without a cane but does better with it.   She has bowel and urinary urgency and has rare incontinence now..   She is on hyoscyamine and take it only if going out of the house  a while   Transverse myelitis history Vaeda Westall  had the onset of lower back pain and leg weakness 02/10/2019. She was at work, when she stood up she fell flat on her face.    She did not get better over the next 2 days and also had urinary incontinence and presented to the ED 02/13/19.  She was  noted to have no sensation in the perineum and sacral area.  She had incontinence of bowel and bladder. She had some left leg weakness.    She was admitted and MRI showed an abnormal focus in the conus medullaris and lower spinal cord adjacent to T12-L1.  This was felt to be a demyelinating lesion versus stroke and she received 5 days of high-dose IV steroids.  While in the hospital she had a lumbar puncture.  Oligoclonal bands were not present.   She has had DM x 7 years and has been on insulin x 1 year.   She denies any numbness in her feet or eye or kidney issues related to DM  Imaging: 02/13/2019: MRI of the lumbar spine showed abnormal signal in the conus medullaris.  There is severe facet hypertrophy with 5 mm of anterolisthesis and moderately severe spinal stenosis at L4-L5.  Mild degenerative changes at the other levels. 02/14/2019: MRI of the thoracic (with and without) and lumbar spine (with contrast)  There is a lesion of the conus medullaris and distal thoracic spinal cord.  It does not enhance.   02/16/2019: MRI of the brain and cervical spine.  The brain was normal for age.  The spinal cord was normal.  There was some degenerative changes noted at the C5-C6 with left foraminal narrowing. 02/18/2019: CT angiogram  of the head and neck were normal.  05/17/2020 brain MRI was normal  05/17/2020 thoracic spine MRI showed abnormal lesion at L1/conus as before.  No enhancement.    Laboratory tests: NMO-IgG Ab was negative.   HIV negative.   ESR mildly elevated at 32.  CSF showed no oligoclonal bands.   CSF proteins and glucose mildly elevated.  7 WBC.   CSF VDRL negative.      REVIEW OF SYSTEMS: Constitutional: No fevers, chills, sweats, or change in appetite Eyes: No visual changes, double vision, eye pain Ear, nose and throat: No hearing loss, ear pain, nasal congestion, sore throat Cardiovascular: No chest pain, palpitations Respiratory:  No shortness of breath at rest or with exertion.    No wheezes GastrointestinaI: No nausea, vomiting, diarrhea.  She has fecal urgency  genitourinary:  No dysuria, urinary retention.  Urgency as above  musculoskeletal:  No neck pain, back pain Integumentary: No rash, pruritus, skin lesions Neurological: as above Psychiatric: No depression at this time.  No anxiety Endocrine: No palpitations, diaphoresis, change in appetite, change in weigh or increased thirst.  She has insulin-dependent type 2 diabetes. Hematologic/Lymphatic:  No anemia, purpura, petechiae. Allergic/Immunologic: No itchy/runny eyes, nasal congestion, recent allergic reactions, rashes  ALLERGIES: Allergies  Allergen Reactions   Jardiance [Empagliflozin]     Yeast infection    HOME MEDICATIONS:  Current Outpatient Medications:    atorvastatin (LIPITOR) 20 MG tablet, Take 20 mg by mouth daily. , Disp: , Rfl:    Cholecalciferol 125 MCG (5000 UT) capsule, Take 5,000 Units by mouth every Monday, Wednesday, and Friday., Disp: , Rfl:    Cyanocobalamin (VITAMIN B-12 PO), Take 1 capsule by mouth every Monday, Wednesday, and Friday., Disp: , Rfl:    docusate sodium (COLACE) 100 MG capsule, Take 1 capsule (100 mg total) by mouth 2 (two) times daily. While taking narcotic pain medicine., Disp: 30 capsule, Rfl: 0   furosemide (LASIX) 20 MG tablet, Take 20 mg by mouth 2 (two) times daily. , Disp: , Rfl:    glucose blood test strip, 1 each by Other route 2 (two) times daily. Use as instructed, Disp: , Rfl:    hyoscyamine (LEVBID) 0.375 MG 12 hr tablet, TAKE 1 TABLET (0.375 MG TOTAL) BY MOUTH 2 (TWO) TIMES DAILY., Disp: 60 tablet, Rfl: 5   Insulin Pen Needle (BD PEN NEEDLE NANO U/F) 32G X 4 MM MISC, 1 each by Does not apply route daily., Disp: 100 each, Rfl: 0   metFORMIN (GLUCOPHAGE) 500 MG tablet, Take 1,000 mg by mouth 2 (two) times daily with a meal. , Disp: , Rfl:    SOLIQUA 100-33 UNT-MCG/ML SOPN, Inject 32 Units into the skin daily before breakfast. , Disp: , Rfl: 2   telmisartan  (MICARDIS) 80 MG tablet, Take 80 mg by mouth daily., Disp: , Rfl:    gabapentin (NEURONTIN) 300 MG capsule, TAKE 1 CAPSULE BY MOUTH QAM AND TWO TABLETS BY MOUTH QHS, Disp: 90 capsule, Rfl: 11   glimepiride (AMARYL) 4 MG tablet, Take 1 tablet (4 mg total) by mouth 2 (two) times a day for 30 days., Disp: 60 tablet, Rfl: 0  PAST MEDICAL HISTORY: Past Medical History:  Diagnosis Date   Arthritis    Diabetes mellitus without complication ([place])    Edema    in left leg in 1980s on left ankle    Hyperlipidemia    Hypertension    Neuropathy    PCOS (polycystic ovarian syndrome)    Spinal cord stroke (  [place])    01/2019   Superficial thrombophlebitis    Swelling    Venous insufficiency    Vitamin D deficiency     PAST SURGICAL HISTORY: Past Surgical History:  Procedure Laterality Date   BREAST BIOPSY Right    FOOT ARTHRODESIS Right 10/10/2020   Procedure: Right talonavicular and subtalar joint arthrodesis;  Surgeon: Wylene Simmer, MD;  Location: [facility];  Service: Orthopedics;  Laterality: Right;   GASTROC RECESSION EXTREMITY Right 10/10/2020   Procedure: Gastroc recession;  Surgeon: Wylene Simmer, MD;  Location: [facility];  Service: Orthopedics;  Laterality: Right;   JOINT REPLACEMENT  2011    right hip    left ankle surgery      TOTAL HIP ARTHROPLASTY Left 12/02/2015   Procedure: LEFT TOTAL HIP ARTHROPLASTY ANTERIOR APPROACH;  Surgeon: [name], MD;  Location: [facility];  Service: Orthopedics;  Laterality: Left;    FAMILY HISTORY: Family History  Problem Relation Age of Onset   Diabetes Mother    Hypertension Mother     SOCIAL HISTORY:  Social History   Socioeconomic History   Marital status: Single    Spouse name: Not on file   Number of children: Not on file   Years of education: Not on file   Highest education level: Not on file  Occupational History   Occupation: Registration Specialist  Tobacco Use   Smoking status: Former    Types:  Cigarettes    Quit date: 10/01/2002    Years since quitting: 18.5   Smokeless tobacco: Never  Substance and Sexual Activity   Alcohol use: No   Drug use: No   Sexual activity: Not on file  Other Topics Concern   Not on file  Social History Narrative   Lives with son who is 4 (03/29/19)   Caffeine use: Hot tea daily   Right handed    Social Determinants of Health   Financial Resource Strain: Not on file  Food Insecurity: Not on file  Transportation Needs: Not on file  Physical Activity: Not on file  Stress: Not on file  Social Connections: Not on file  Intimate Partner Violence: Not on file     PHYSICAL EXAM  Vitals:   04/02/21 0948  BP: (!) 148/92  Pulse: 81  Weight: 265 lb 8 oz (120.4 kg)  Height: $Remove'5\' 6"'ExuwuGz$  (1.676 m)    Body mass index is 42.85 kg/m.   General: The patient is well-developed and well-nourished and in no acute distress  HEENT:  Head is [place]/AT.  Sclera are anicteric.    Skin: Extremities are without rash.  She has severe edema in the left leg (old)  Musculoskeletal:  Back is nontender  Neurologic Exam  Mental status: The patient is alert and oriented x 3 at the time of the examination. The patient has apparent normal recent and remote memory, with an apparently normal attention span and concentration ability.   Speech is normal.  Cranial nerves: Extraocular movements are full.  Color vision is symmetric.  Normal facial strength.  Trapezius and sternocleidomastoid strength is normal. No dysarthria is noted. No obvious hearing deficits are noted.  Motor:  Muscle bulk is normal.   Tone is normal. Strength is  5 / 5 in all 4 extremities.  However, with functional testing she has reduced gastrocnemius strength on the left.  Sensory: Sensory testing is intact to pinprick, soft touch and vibration sensation in the arms.  Reduced S1 and lower sensation to touch and  temperature, worse on left (absent S1 and lower).   Reduced vibration sensation in right foot  relative left.     Coordination: Cerebellar testing reveals good finger-nose-finger and heel-to-shin bilaterally.  Gait and station: Station is normal.   Gait is mildly wide with reduced stride but she can walk without a cane.  She does not elevate the heels as she walks.. Romberg is negative.   Reflexes: Deep tendon reflexes are symmetric and normal bilaterally.      DIAGNOSTIC DATA (LABS, IMAGING, TESTING) - I reviewed patient records, labs, notes, testing and imaging myself where available.  Lab Results  Component Value Date   WBC 8.0 06/06/2019   HGB 12.9 06/06/2019   HCT 38.6 06/06/2019   MCV 81 06/06/2019   PLT 275 06/06/2019      Component Value Date/Time   NA 140 10/08/2020 1230   NA 141 05/04/2017 0818   K 4.8 10/08/2020 1230   CL 107 10/08/2020 1230   CO2 25 10/08/2020 1230   GLUCOSE 106 (H) 10/08/2020 1230   BUN 15 10/08/2020 1230   BUN 11 05/04/2017 0818   CREATININE 0.54 10/08/2020 1230   CALCIUM 9.9 10/08/2020 1230   PROT 6.2 (L) 02/14/2019 0809   PROT 6.9 05/04/2017 0818   ALBUMIN 3.1 (L) 02/14/2019 0809   ALBUMIN 4.1 05/04/2017 0818   AST 42 (H) 02/14/2019 0809   ALT 26 02/14/2019 0809   ALKPHOS 57 02/14/2019 0809   BILITOT 1.2 02/14/2019 0809   BILITOT 0.2 05/04/2017 0818   GFRNONAA >60 10/08/2020 1230   GFRAA >60 02/15/2019 0454   Lab Results  Component Value Date   CHOL 163 02/18/2019   HDL 60 02/18/2019   LDLCALC 81 02/18/2019   TRIG 110 02/18/2019   CHOLHDL 2.7 02/18/2019   Lab Results  Component Value Date   HGBA1C 7.5 (H) 02/14/2019   Lab Results  Component Value Date   XYVOPFYT24 462 12/31/2016   Lab Results  Component Value Date   TSH 0.797 06/06/2019       ASSESSMENT AND PLAN    1. Disease of spinal cord ([place])   2. Dysesthesia   3. Gait disturbance   4. Numbness      1.   She has symptoms from S1 to be low, left greater than right due to the lesion in the lower spinal cord.  We discussed that the spinal cord  lesion could be a spinal cord infarct or idiopathic transverse myelitis.  Of note, it did not enhance at presentation as would have been expected if inflammatory.  No new lesion in 2021 makes MS less likely 2.   She would prefer to take medicine twice a day compared to 3 times a day.  Advised to increase gabapentin to 300 mg in am and 600 mg at night.  If this does not help, consider lamotrigine for her dysesthetic pain. 3.   Continue hyoscyamine 0.375 bid to try to help both bowel and bladder  4.   RTC 6 months or sooner if new or worsening symptoms   Xavion Muscat A. Felecia Shelling, MD, [facility] 04/05/3816, 7:11 PM Certified in Neurology, Clinical Neurophysiology, Sleep Medicine and Neuroimaging  [facility] [address] [place], [place] [address] [phone]

## 2021-04-30 ENCOUNTER — Encounter: Payer: Self-pay | Admitting: *Deleted

## 2021-06-17 ENCOUNTER — Telehealth: Payer: Self-pay | Admitting: *Deleted

## 2021-06-17 NOTE — Telephone Encounter (Signed)
Pt [place] form faxed on 06/10/2021

## 2021-07-08 ENCOUNTER — Ambulatory Visit
Admission: RE | Admit: 2021-07-08 | Discharge: 2021-07-08 | Disposition: A | Discharge status: Home / Self Care | Payer: 59 | Source: Ambulatory Visit | Attending: Family Medicine | Admitting: Family Medicine

## 2021-07-08 ENCOUNTER — Other Ambulatory Visit: Payer: Self-pay | Admitting: Family Medicine

## 2021-07-08 DIAGNOSIS — M25569 Pain in unspecified knee: Secondary | ICD-10-CM

## 2021-07-08 DIAGNOSIS — R0789 Other chest pain: Secondary | ICD-10-CM

## 2021-08-12 ENCOUNTER — Other Ambulatory Visit: Payer: Self-pay | Admitting: *Deleted

## 2021-08-12 MED ORDER — GABAPENTIN 300 MG PO CAPS: ORAL_CAPSULE | ORAL | 2 refills | Status: DC

## 2021-10-08 ENCOUNTER — Ambulatory Visit: Payer: 59 | Admitting: Family Medicine

## 2021-11-06 ENCOUNTER — Other Ambulatory Visit: Payer: Self-pay | Admitting: Family Medicine

## 2021-11-06 DIAGNOSIS — Z1231 Encounter for screening mammogram for malignant neoplasm of breast: Secondary | ICD-10-CM

## 2021-11-12 ENCOUNTER — Other Ambulatory Visit: Payer: Self-pay | Admitting: Neurology

## 2021-12-10 ENCOUNTER — Ambulatory Visit: Payer: 59

## 2021-12-10 ENCOUNTER — Ambulatory Visit
Admission: RE | Admit: 2021-12-10 | Discharge: 2021-12-10 | Disposition: A | Discharge status: Home / Self Care | Payer: Medicare Other | Source: Ambulatory Visit | Attending: Family Medicine | Admitting: Family Medicine

## 2021-12-10 DIAGNOSIS — Z1231 Encounter for screening mammogram for malignant neoplasm of breast: Secondary | ICD-10-CM

## 2021-12-24 ENCOUNTER — Encounter: Payer: Self-pay | Admitting: Family Medicine

## 2021-12-24 ENCOUNTER — Ambulatory Visit (INDEPENDENT_AMBULATORY_CARE_PROVIDER_SITE_OTHER): Payer: Medicare Other | Admitting: Family Medicine

## 2021-12-24 VITALS — BP 134/83 | HR 90 | Ht 66.0 in | Wt 267.4 lb

## 2021-12-24 DIAGNOSIS — G959 Disease of spinal cord, unspecified: Secondary | ICD-10-CM | POA: Diagnosis not present

## 2021-12-24 DIAGNOSIS — R208 Other disturbances of skin sensation: Secondary | ICD-10-CM

## 2021-12-24 DIAGNOSIS — R269 Unspecified abnormalities of gait and mobility: Secondary | ICD-10-CM | POA: Diagnosis not present

## 2021-12-24 MED ORDER — GABAPENTIN 300 MG PO CAPS
ORAL_CAPSULE | ORAL | 1 refills | Status: DC
Start: 2021-12-24 — End: 2022-05-12

## 2021-12-24 NOTE — Patient Instructions (Addendum)
Below is our plan: ? ?We will continue current treatment plan. Continue gabapentin '300mg'$  in am and '600mg'$  in the evenings. May consider trying to take hyoscyamine every night. You can take it up to twice daily.  ? ?Please make sure you are staying well hydrated. I recommend 50-60 ounces daily. Well balanced diet and regular exercise encouraged. Consistent sleep schedule with 6-8 hours recommended.  ? ?Please continue follow up with care team as directed.  ? ?Follow up with me in 6 months  ? ?You may receive a survey regarding today's visit. I encourage you to leave honest feed back as I do use this information to improve patient care. Thank you for seeing me today!  ? ? ?

## 2021-12-24 NOTE — Progress Notes (Signed)
? ? ?Chief Complaint  ?Patient presents with  ? Follow-up  ?  Rm 16, alone. Here for 6 month f/u. Cramping mainly in R leg, intermittent. Having bowel and urinary incontinence. Numbness in L side from buttocks to feet affecting balance.   ? ? ?HISTORY OF PRESENT ILLNESS: ? ?12/24/21 ALL: ?Laura Blackwell is a 66 y.o. female here today for follow up for a spinal cord lesion of T12-L1 in 01/2019. Concerns for spinal cord infarct versus transverse myelitis versus MS. Follow up imaging 05/17/2020 was unchanged. Workup shows MS less likely. Brain MRI normal 05/2020. Thoracic MRI 05/2020 showed abnormal signal at L1 unchanged from 2020. ? ?She reports symptoms are unchanged. Gait is fairly stable. She does keep a cane with her to use in case she feels unsteady. She occasionally uses a walker. She does feel that dysesthesias are better.  She continues to have more numbness of left perineal area and left leg. Gabapentin helps, using 319m in am and 6022mat bedtime. Pregabalin 10056mID was not effective.  ? ?Bowel and bladder habits are unchanged. She has an accident, could be urine or bowel about once a month. Hyoscamine helps. She does not take it daily. She recently had a colonoscopy. She had polyps with previous study but reports this one was normal. She tried to eat plenty of fiber.  ? ?She reports CBGs are stable. Fasting readings are usually low 100s. Last A1C 6.8. [facility] ?HISTORY (copied from Dr SatGarth Bignessevious note) ? ?Laura Blackwell a [age]ar old woman with a spinal cord lesion. ?  ?Update 04/02/2021: ?She continues to have a burning sensation in the left buttock and down.   The right side is better.    Initially, her right side was worse but it has improved.  She has near-total numbness in the perineum, though there was some recovery on the right.    She takes gabapentin 300 mg bid (skips pm dose as she does not like taking med's).  It has not helped    Of note, she is  able to feel more in her legs and has less numbness on the right and very slightly less numbness on the left.   ?  ?She has edema in her left leg since an operation many years ago.  She usually wears compression stockings (did not today due to exam).   ?  ?She can walk without a cane but does better with it.  ?  ?She has bowel and urinary urgency and has rare incontinence now..   She is on hyoscyamine and take it only if going out of the house  a while ?  ?  ?Transverse myelitis history ?Laura Blackwell the onset of lower back pain and leg weakness 02/10/2019. She was at work, when she stood up she fell flat on her face.    She did not get better over the next 2 days and also had urinary incontinence and presented to the ED 02/13/19.  She was noted to have no sensation in the perineum and sacral area.  She had incontinence of bowel and bladder. She had some left leg weakness.    She was admitted and MRI showed an abnormal focus in the conus medullaris and lower spinal cord adjacent to T12-L1.  This was felt to be a demyelinating lesion versus stroke and she received 5 days of high-dose IV steroids.  While in the hospital she had a lumbar  puncture.  Oligoclonal bands were not present. ?  ?She has had DM x 7 years and has been on insulin x 1 year.   She denies any numbness in her feet or eye or kidney issues related to DM ?  ?Imaging: ?02/13/2019: MRI of the lumbar spine showed abnormal signal in the conus medullaris.  There is severe facet hypertrophy with 5 mm of anterolisthesis and moderately severe spinal stenosis at L4-L5.  Mild degenerative changes at the other levels. ?02/14/2019: MRI of the thoracic (with and without) and lumbar spine (with contrast)  There is a lesion of the conus medullaris and distal thoracic spinal cord.  It does not enhance.   ?02/16/2019: MRI of the brain and cervical spine.  The brain was normal for age.  The spinal cord was normal.  There was some degenerative changes noted at the  C5-C6 with left foraminal narrowing. ?02/18/2019: CT angiogram of the head and neck were normal. ?  ?05/17/2020 brain MRI was normal ?  ?05/17/2020 thoracic spine MRI showed abnormal lesion at L1/conus as before.  No enhancement.   ?  ?Laboratory tests: ?NMO-IgG Ab was negative.   HIV negative.   ESR mildly elevated at 32.  CSF showed no oligoclonal bands.   CSF proteins and glucose mildly elevated.  7 WBC.   CSF VDRL negative.  ? ?REVIEW OF SYSTEMS: Out of a complete 14 system review of symptoms, the patient complains only of the following symptoms,  dysesthesias, numbness of left foot, intermittent urinary and bowel incontinence and all other reviewed systems are negative. ? ?ALLERGIES: ?Allergies  ?Allergen Reactions  ? Jardiance [Empagliflozin]   ?  Yeast infection  ? ? ?HOME MEDICATIONS: ?Outpatient Medications Prior to Visit  ?Medication Sig Dispense Refill  ? atorvastatin (LIPITOR) 20 MG tablet Take 20 mg by mouth daily.     ? docusate sodium (COLACE) 100 MG capsule Take 1 capsule (100 mg total) by mouth 2 (two) times daily. While taking narcotic pain medicine. 30 capsule 0  ? furosemide (LASIX) 20 MG tablet Take 20 mg by mouth 2 (two) times daily.     ? glucose blood test strip 1 each by Other route 2 (two) times daily. Use as instructed    ? hyoscyamine (LEVBID) 0.375 MG 12 hr tablet TAKE 1 TABLET TWICE A DAY 180 tablet 0  ? Insulin Pen Needle (BD PEN NEEDLE NANO U/F) 32G X 4 MM MISC 1 each by Does not apply route daily. 100 each 0  ? metFORMIN (GLUCOPHAGE) 500 MG tablet Take 1,000 mg by mouth 2 (two) times daily with a meal.     ? MOUNJARO 2.5 MG/0.5ML Pen Inject into the skin.    ? telmisartan (MICARDIS) 80 MG tablet Take 80 mg by mouth daily.    ? gabapentin (NEURONTIN) 300 MG capsule TAKE 1 CAPSULE BY MOUTH QAM AND TWO TABLETS BY MOUTH QHS 270 capsule 2  ? Cholecalciferol 125 MCG (5000 UT) capsule Take 5,000 Units by mouth every Monday, Wednesday, and Friday.    ? Cyanocobalamin (VITAMIN B-12 PO) Take 1  capsule by mouth every Monday, Wednesday, and Friday.    ? glimepiride (AMARYL) 4 MG tablet Take 1 tablet (4 mg total) by mouth 2 (two) times a day for 30 days. 60 tablet 0  ? SOLIQUA 100-33 UNT-MCG/ML SOPN Inject 32 Units into the skin daily before breakfast.   2  ? ?No facility-administered medications prior to visit.  ? ? ? ?PAST MEDICAL HISTORY: ?Past Medical History:  ?  Diagnosis Date  ? Arthritis   ? Diabetes mellitus without complication ([place])   ? Edema   ? in left leg in 1980s on left ankle   ? Hyperlipidemia   ? Hypertension   ? Neuropathy   ? PCOS (polycystic ovarian syndrome)   ? Spinal cord stroke ([place])   ? 01/2019  ? Superficial thrombophlebitis   ? Swelling   ? Venous insufficiency   ? Vitamin D deficiency   ? ? ? ?PAST SURGICAL HISTORY: ?Past Surgical History:  ?Procedure Laterality Date  ? BREAST BIOPSY Right   ? FOOT ARTHRODESIS Right 10/10/2020  ? Procedure: Right talonavicular and subtalar joint arthrodesis;  Surgeon: Wylene Simmer, MD;  Location: [facility];  Service: Orthopedics;  Laterality: Right;  ? GASTROC RECESSION EXTREMITY Right 10/10/2020  ? Procedure: Gastroc recession;  Surgeon: Wylene Simmer, MD;  Location: [facility];  Service: Orthopedics;  Laterality: Right;  ? JOINT REPLACEMENT  2011   ? right hip   ? left ankle surgery     ? TOTAL HIP ARTHROPLASTY Left 12/02/2015  ? Procedure: LEFT TOTAL HIP ARTHROPLASTY ANTERIOR APPROACH;  Surgeon: [name], MD;  [facility];  Service: Orthopedics;  Laterality: Left;  ? ? ? ?FAMILY HISTORY: ?Family History  ?Problem Relation Age of Onset  ? Diabetes Mother   ? Hypertension Mother   ? ? ? ?SOCIAL HISTORY: ?Social History  ? ?Socioeconomic History  ? Marital status: Single  ?  Spouse name: Not on file  ? Number of children: Not on file  ? Years of education: Not on file  ? Highest education level: Not on file  ?Occupational History  ? Occupation: Therapist, art  ?Tobacco Use  ? Smoking status: Former  ?   Types: Cigarettes  ?  Quit date: 10/01/2002  ?  Years since quitting: 19.2  ? Smokeless tobacco: Never  ?Substance and Sexual Activity  ? Alcohol use: No  ? Drug use: No  ? Sexual activity: Not on file  ?Oth

## 2022-04-02 ENCOUNTER — Ambulatory Visit: Payer: 59 | Admitting: Neurology

## 2022-05-12 ENCOUNTER — Other Ambulatory Visit: Payer: Self-pay

## 2022-05-12 MED ORDER — GABAPENTIN 300 MG PO CAPS: ORAL_CAPSULE | ORAL | 0 refills | Status: DC

## 2022-06-23 ENCOUNTER — Encounter: Payer: Self-pay | Admitting: Neurology

## 2022-06-23 ENCOUNTER — Ambulatory Visit (INDEPENDENT_AMBULATORY_CARE_PROVIDER_SITE_OTHER): Payer: Medicare Other | Admitting: Neurology

## 2022-06-23 VITALS — BP 127/81 | HR 91 | Ht 66.0 in | Wt 273.8 lb

## 2022-06-23 DIAGNOSIS — G959 Disease of spinal cord, unspecified: Secondary | ICD-10-CM

## 2022-06-23 DIAGNOSIS — R200 Anesthesia of skin: Secondary | ICD-10-CM

## 2022-06-23 DIAGNOSIS — R269 Unspecified abnormalities of gait and mobility: Secondary | ICD-10-CM | POA: Diagnosis not present

## 2022-06-23 DIAGNOSIS — G373 Acute transverse myelitis in demyelinating disease of central nervous system: Secondary | ICD-10-CM | POA: Diagnosis not present

## 2022-06-23 DIAGNOSIS — R208 Other disturbances of skin sensation: Secondary | ICD-10-CM | POA: Diagnosis not present

## 2022-06-23 MED ORDER — GABAPENTIN 300 MG PO CAPS: ORAL_CAPSULE | ORAL | 3 refills | Status: DC

## 2022-06-23 MED ORDER — HYOSCYAMINE SULFATE ER 0.375 MG PO TB12: 0.3750 mg | ORAL_TABLET | Freq: Two times a day (BID) | ORAL | 3 refills | Status: DC

## 2022-06-23 NOTE — Progress Notes (Signed)
[facility]  PATIENT: Laura Blackwell DOB: [birth year]  REFERRING DOCTOR OR PCP: Carol Ada, MD SOURCE: Patient, Notes from Dr. Tamala Julian, imaging and lab reports, multiple MRIs of the spine and brain personally reviewed  _________________________________   HISTORICAL  CHIEF COMPLAINT:  Chief Complaint  Patient presents with   Follow-up    Pt in room #1 and alone. Pt here today for f/u with her disease of spinal cord.    HISTORY OF PRESENT ILLNESS:  Lisvet Blackwell is a 66 year old woman with a spinal cord lesion.  Update 06/23/2022: She has a T12/L1 transverse myelitis (idiopathic) since 01/2019.      She continues to have a burning sensation in the left buttock and down.   The right side is better.    Initially, her right side was worse but it has improved.  She has near-total numbness in the perineum, though there was some recovery on the right.    She takes gabapentin 300 mg qAM and 600 mg po qHS without much benefit.     She is reluctant to increase the dose or try a different medication (I suggested lamotrigine).    Of note, she is able to feel more in her legs and has less numbness on the right and very slightly less numbness on the left.    She has mild left leg weakness.   She needs to pull self up stairs and left leg neds to be lifted at times on curbs.    She has edema in her left leg since an operation many years ago.  She usually wears compression stockings (did not today due to exam).    She was on Mounjoro for DM and did not lose much weight.   She stopped it.   She also tok Ozempic x 1 month. She has lost some weight and is eating better.    She can walk without a cane but does better with it.   She has bowel and urinary urgency and has rare incontinence now (once or twice a month).  The incontinence occurs with very little warning.   She is on hyoscyamine and take it only if going out of the house  a while.   She sometimes wears Depends  outside the house.   She always makes sure she has wipes/etc    Transverse myelitis history [name]  had the onset of lower back pain and leg weakness 02/10/2019. She was at work, when she stood up she fell flat on her face.    She did not get better over the next 2 days and also had urinary incontinence and presented to the ED 02/13/19.  She was noted to have no sensation in the perineum and sacral area.  She had incontinence of bowel and bladder. She had some left leg weakness.    She was admitted and MRI showed an abnormal focus in the conus medullaris and lower spinal cord adjacent to T12-L1.  This was felt to be a demyelinating lesion versus stroke and she received 5 days of high-dose IV steroids.  While in the hospital she had a lumbar puncture.  Oligoclonal bands were not present.   She has had DM x 7 years and has been on insulin x 1 year.   She denies any numbness in her feet or eye or kidney issues related to DM  Imaging: 02/13/2019: MRI of the lumbar spine showed abnormal signal in the conus medullaris.  There is severe facet hypertrophy with  5 mm of anterolisthesis and moderately severe spinal stenosis at L4-L5.  Mild degenerative changes at the other levels. 02/14/2019: MRI of the thoracic (with and without) and lumbar spine (with contrast)  There is a lesion of the conus medullaris and distal thoracic spinal cord.  It does not enhance.   02/16/2019: MRI of the brain and cervical spine.  The brain was normal for age.  The spinal cord was normal.  There was some degenerative changes noted at the C5-C6 with left foraminal narrowing. 02/18/2019: CT angiogram of the head and neck were normal.  05/17/2020 brain MRI was normal  05/17/2020 thoracic spine MRI showed abnormal lesion at L1/conus as before.  No enhancement.    Laboratory tests: NMO-IgG Ab was negative.   HIV negative.   ESR mildly elevated at 32.  CSF showed no oligoclonal bands.   CSF proteins and glucose mildly elevated.  7  WBC.   CSF VDRL negative.      REVIEW OF SYSTEMS: Constitutional: No fevers, chills, sweats, or change in appetite Eyes: No visual changes, double vision, eye pain Ear, nose and throat: No hearing loss, ear pain, nasal congestion, sore throat Cardiovascular: No chest pain, palpitations Respiratory:  No shortness of breath at rest or with exertion.   No wheezes GastrointestinaI: No nausea, vomiting, diarrhea.  She has fecal urgency  genitourinary:  No dysuria, urinary retention.  Urgency as above  musculoskeletal:  No neck pain, back pain Integumentary: No rash, pruritus, skin lesions Neurological: as above Psychiatric: No depression at this time.  No anxiety Endocrine: No palpitations, diaphoresis, change in appetite, change in weigh or increased thirst.  She has insulin-dependent type 2 diabetes. Hematologic/Lymphatic:  No anemia, purpura, petechiae. Allergic/Immunologic: No itchy/runny eyes, nasal congestion, recent allergic reactions, rashes  ALLERGIES: Allergies  Allergen Reactions   Jardiance [Empagliflozin]     Yeast infection    HOME MEDICATIONS:  Current Outpatient Medications:    atorvastatin (LIPITOR) 20 MG tablet, Take 20 mg by mouth daily. , Disp: , Rfl:    docusate sodium (COLACE) 100 MG capsule, Take 1 capsule (100 mg total) by mouth 2 (two) times daily. While taking narcotic pain medicine., Disp: 30 capsule, Rfl: 0   furosemide (LASIX) 20 MG tablet, Take 20 mg by mouth 2 (two) times daily. , Disp: , Rfl:    glucose blood test strip, 1 each by Other route 2 (two) times daily. Use as instructed, Disp: , Rfl:    Insulin Pen Needle (BD PEN NEEDLE NANO U/F) 32G X 4 MM MISC, 1 each by Does not apply route daily., Disp: 100 each, Rfl: 0   metFORMIN (GLUCOPHAGE) 500 MG tablet, Take 1,000 mg by mouth 2 (two) times daily with a meal. , Disp: , Rfl:    MOUNJARO 2.5 MG/0.5ML Pen, Inject into the skin., Disp: , Rfl:    telmisartan (MICARDIS) 80 MG tablet, Take 80 mg by mouth  daily., Disp: , Rfl:    gabapentin (NEURONTIN) 300 MG capsule, TAKE 1 CAPSULE BY MOUTH QAM AND TWO TABLETS BY MOUTH QHS, Disp: 270 capsule, Rfl: 3   hyoscyamine (LEVBID) 0.375 MG 12 hr tablet, Take 1 tablet (0.375 mg total) by mouth 2 (two) times daily., Disp: 180 tablet, Rfl: 3  PAST MEDICAL HISTORY: Past Medical History:  Diagnosis Date   Arthritis    Diabetes mellitus without complication ([place])    Edema    in left leg in 1980s on left ankle    Hyperlipidemia    Hypertension  Neuropathy    PCOS (polycystic ovarian syndrome)    Spinal cord stroke (HCC)    01/2019   Superficial thrombophlebitis    Swelling    Venous insufficiency    Vitamin D deficiency     PAST SURGICAL HISTORY: Past Surgical History:  Procedure Laterality Date   BREAST BIOPSY Right    FOOT ARTHRODESIS Right 10/10/2020   Procedure: Right talonavicular and subtalar joint arthrodesis;  Surgeon: Wylene Simmer, MD;  Location: [facility];  Service: Orthopedics;  Laterality: Right;   GASTROC RECESSION EXTREMITY Right 10/10/2020   Procedure: Gastroc recession;  Surgeon: Wylene Simmer, MD;  Location: [facility];  Service: Orthopedics;  Laterality: Right;   JOINT REPLACEMENT  2011    right hip    left ankle surgery      TOTAL HIP ARTHROPLASTY Left 12/02/2015   Procedure: LEFT TOTAL HIP ARTHROPLASTY ANTERIOR APPROACH;  Surgeon: [name], MD;  Location: [facility];  Service: Orthopedics;  Laterality: Left;    FAMILY HISTORY: Family History  Problem Relation Age of Onset   Diabetes Mother    Hypertension Mother     SOCIAL HISTORY:  Social History   Socioeconomic History   Marital status: Single    Spouse name: Not on file   Number of children: Not on file   Years of education: Not on file   Highest education level: Not on file  Occupational History   Occupation: Registration Specialist  Tobacco Use   Smoking status: Former    Types: Cigarettes    Quit date: 10/01/2002    Years  since quitting: 19.7   Smokeless tobacco: Never  Substance and Sexual Activity   Alcohol use: No   Drug use: No   Sexual activity: Not on file  Other Topics Concern   Not on file  Social History Narrative   Lives with son who is 106 (03/29/19)   Caffeine use: Hot tea daily   Right handed    Social Determinants of Health   Financial Resource Strain: Not on file  Food Insecurity: Not on file  Transportation Needs: Not on file  Physical Activity: Not on file  Stress: Not on file  Social Connections: Not on file  Intimate Partner Violence: Not on file     PHYSICAL EXAM  Vitals:   06/23/22 0957  BP: 127/81  Pulse: 91  Weight: 273 lb 12.8 oz (124.2 kg)  Height: _0  (1.676 m)    Body mass index is 44.19 kg/m.   General: The patient is well-developed and well-nourished and in no acute distress  HEENT:  Head is [place]/AT.  Sclera are anicteric.    Skin: Extremities are without rash.  She has severe edema in the left leg (old)  Musculoskeletal:  Back is nontender  Neurologic Exam  Mental status: The patient is alert and oriented x 3 at the time of the examination. The patient has apparent normal recent and remote memory, with an apparently normal attention span and concentration ability.   Speech is normal.  Cranial nerves: Extraocular movements are full.  Color vision is symmetric.  Normal facial strength.  Trapezius and sternocleidomastoid strength is normal. No dysarthria is noted. No obvious hearing deficits are noted.  Motor:  Muscle bulk is normal.   Tone is normal. Strength is  5 / 5 in all 4 extremities.  However, with functional testing she has reduced gastrocnemius strength on the left.  Sensory: Sensory testing is intact to pinprick, soft touch and vibration  sensation in the arms.  Reduced S1/S2 sensation to touch and temperature, worse on left (absent S1 and lower).   Reduced vibration sensation in right foot relative left.     Coordination: Cerebellar testing  reveals good finger-nose-finger and heel-to-shin bilaterally.  Gait and station: Station is normal.   She is able to walk in the room without a cane.  The left leg has a mild foot drop.. Romberg is negative.   Reflexes: Deep tendon reflexes are symmetric and normal bilaterally.      DIAGNOSTIC DATA (LABS, IMAGING, TESTING) - I reviewed patient records, labs, notes, testing and imaging myself where available.  Lab Results  Component Value Date   WBC 8.0 06/06/2019   HGB 12.9 06/06/2019   HCT 38.6 06/06/2019   MCV 81 06/06/2019   PLT 275 06/06/2019      Component Value Date/Time   NA 140 10/08/2020 1230   NA 141 05/04/2017 0818   K 4.8 10/08/2020 1230   CL 107 10/08/2020 1230   CO2 25 10/08/2020 1230   GLUCOSE 106 (H) 10/08/2020 1230   BUN 15 10/08/2020 1230   BUN 11 05/04/2017 0818   CREATININE 0.54 10/08/2020 1230   CALCIUM 9.9 10/08/2020 1230   PROT 6.2 (L) 02/14/2019 0809   PROT 6.9 05/04/2017 0818   ALBUMIN 3.1 (L) 02/14/2019 0809   ALBUMIN 4.1 05/04/2017 0818   AST 42 (H) 02/14/2019 0809   ALT 26 02/14/2019 0809   ALKPHOS 57 02/14/2019 0809   BILITOT 1.2 02/14/2019 0809   BILITOT 0.2 05/04/2017 0818   GFRNONAA >60 10/08/2020 1230   GFRAA >60 02/15/2019 0454   Lab Results  Component Value Date   CHOL 163 02/18/2019   HDL 60 02/18/2019   LDLCALC 81 02/18/2019   TRIG 110 02/18/2019   CHOLHDL 2.7 02/18/2019   Lab Results  Component Value Date   HGBA1C 7.5 (H) 02/14/2019   Lab Results  Component Value Date   MHWKGSUP10 315 12/31/2016   Lab Results  Component Value Date   TSH 0.797 06/06/2019       ASSESSMENT AND PLAN    1. Disease of spinal cord ([place])   2. Transverse myelitis ([place])   3. Dysesthesia   4. Gait disturbance   5. Numbness       1.   She had idiopathic transverse myelitis or spinal cord infarction at T12/L1.  She has sensory symptoms at and below the S1 dermatome, worse on the left associated with dysesthesias she has symptoms  from S1 to be low, left greater than right due to the lesion in the lower spinal cord.   No new lesion on follow-up brain MRIs makes MS less likely 2.  She has only partial benefit from gabapentin.  If this does not help, consider lamotrigine for her dysesthetic pain. 3.   Continue hyoscyamine 0.375 bid (and ok to go to tid if traveling) to try to help both bowel and bladder  4.   RTC 6 months or sooner if new or worsening symptoms  40-minute office visit with the majority of the time spent face-to-face for history and physical, discussion/counseling and decision-making.  Additional time with record review and documentation.  Palyn Scrima A. Felecia Shelling, MD, [facility] 94/58/5929, 24:46 PM Certified in Neurology, Clinical Neurophysiology, Sleep Medicine and Neuroimaging  [facility] [address] [place], [place] [address] [phone]

## 2022-07-08 ENCOUNTER — Ambulatory Visit: Payer: Medicare Other | Admitting: Physical Therapy

## 2022-08-19 ENCOUNTER — Telehealth: Payer: Self-pay | Admitting: Neurology

## 2022-08-19 NOTE — Telephone Encounter (Signed)
Received form to be updated and completed for the pt. Form completed and placed in box for Dr Felecia Shelling to sign.

## 2022-08-20 NOTE — Telephone Encounter (Signed)
Gave completed/signed form back to medical records to process for pt. 

## 2022-10-07 ENCOUNTER — Ambulatory Visit (INDEPENDENT_AMBULATORY_CARE_PROVIDER_SITE_OTHER): Payer: Medicare Other | Admitting: Podiatry

## 2022-10-07 ENCOUNTER — Other Ambulatory Visit: Payer: Self-pay | Admitting: Podiatry

## 2022-10-07 VITALS — BP 128/78

## 2022-10-07 DIAGNOSIS — B351 Tinea unguium: Secondary | ICD-10-CM

## 2022-10-07 DIAGNOSIS — Z79899 Other long term (current) drug therapy: Secondary | ICD-10-CM

## 2022-10-07 NOTE — Progress Notes (Signed)
Subjective:  Patient ID: Laura Blackwell, female    DOB: 1956/04/04,  MRN: [id number]  Chief Complaint  Patient presents with   Nail Problem    Nail discoloration     67 y.o. female presents with the above complaint.  Patient presents with thickened elongated dystrophic toenails x 2 consistently onychomycosis.  She wanted to discuss treatment options for it.  She is tried over-the-counter options including Epsom salts or Listerine.  She states that it has been bothering her and discoloration.  She wanted discuss aggressive options she has not seen MRIs prior to seeing me she is a diabetic.   Review of Systems: Negative except as noted in the HPI. Denies N/V/F/Ch.  Past Medical History:  Diagnosis Date   Arthritis    Diabetes mellitus without complication ([place])    Edema    in left leg in 1980s on left ankle    Hyperlipidemia    Hypertension    Neuropathy    PCOS (polycystic ovarian syndrome)    Spinal cord stroke (HCC)    01/2019   Superficial thrombophlebitis    Swelling    Venous insufficiency    Vitamin D deficiency     Current Outpatient Medications:    atorvastatin (LIPITOR) 20 MG tablet, Take 20 mg by mouth daily. , Disp: , Rfl:    docusate sodium (COLACE) 100 MG capsule, Take 1 capsule (100 mg total) by mouth 2 (two) times daily. While taking narcotic pain medicine., Disp: 30 capsule, Rfl: 0   furosemide (LASIX) 20 MG tablet, Take 20 mg by mouth 2 (two) times daily. , Disp: , Rfl:    gabapentin (NEURONTIN) 300 MG capsule, TAKE 1 CAPSULE BY MOUTH QAM AND TWO TABLETS BY MOUTH QHS, Disp: 270 capsule, Rfl: 3   glucose blood test strip, 1 each by Other route 2 (two) times daily. Use as instructed, Disp: , Rfl:    hyoscyamine (LEVBID) 0.375 MG 12 hr tablet, Take 1 tablet (0.375 mg total) by mouth 2 (two) times daily., Disp: 180 tablet, Rfl: 3   Insulin Pen Needle (BD PEN NEEDLE NANO U/F) 32G X 4 MM MISC, 1 each by Does not apply route daily., Disp: 100 each, Rfl: 0    metFORMIN (GLUCOPHAGE) 500 MG tablet, Take 1,000 mg by mouth 2 (two) times daily with a meal. , Disp: , Rfl:    MOUNJARO 2.5 MG/0.5ML Pen, Inject into the skin., Disp: , Rfl:    telmisartan (MICARDIS) 80 MG tablet, Take 80 mg by mouth daily., Disp: , Rfl:   Social History   Tobacco Use  Smoking Status Former   Types: Cigarettes   Quit date: 10/01/2002   Years since quitting: 20.0  Smokeless Tobacco Never    Allergies  Allergen Reactions   Jardiance [Empagliflozin]     Yeast infection   Objective:   Vitals:   10/07/22 0955  BP: 128/78   There is no height or weight on file to calculate BMI. Constitutional Well developed. Well nourished.  Vascular Dorsalis pedis pulses palpable bilaterally. Posterior tibial pulses palpable bilaterally. Capillary refill normal to all digits.  No cyanosis or clubbing noted. Pedal hair growth normal.  Neurologic Normal speech. Oriented to person, place, and time. Epicritic sensation to light touch grossly present bilaterally.  Dermatologic Nails thickened and again dystrophic mycotic toenails x 2 mild pain on palpation Skin within normal limits  Orthopedic: Normal joint ROM without pain or crepitus bilaterally. No visible deformities. No bony tenderness.   Radiographs: None Assessment:  1. Long-term use of high-risk medication   2. Onychomycosis   3. Nail fungus    Plan:  Patient was evaluated and treated and all questions answered.  Bilateral hallux onychomycosis -Educated the patient on the etiology of onychomycosis and various treatment options associated with improving the fungal load.  I explained to the patient that there is 3 treatment options available to treat the onychomycosis including topical, p.o., laser treatment.  Patient elected to undergo p.o. options with Lamisil/terbinafine therapy.  In order for me to start the medication therapy, I explained to the patient the importance of evaluating the liver and obtaining the  liver function test.  Once the liver function test comes back normal I will start him on 74-monthcourse of Lamisil therapy.  Patient understood all risk and would like to proceed with Lamisil therapy.  I have asked the patient to immediately stop the Lamisil therapy if she has any reactions to it and call the office or go to the emergency room right away.  Patient states understanding   No follow-ups on file.

## 2022-10-08 LAB — HEPATIC FUNCTION PANEL
ALT: 8 IU/L (ref 0–32)
AST: 12 IU/L (ref 0–40)
Albumin: 4.4 g/dL (ref 3.9–4.9)
Alkaline Phosphatase: 81 IU/L (ref 44–121)
Bilirubin Total: <0.2 mg/dL (ref 0.0–1.2)
Bilirubin, Direct: <0.1 mg/dL (ref 0.00–0.40)
Total Protein: 7.1 g/dL (ref 6.0–8.5)

## 2022-10-08 MED ORDER — TERBINAFINE HCL 250 MG PO TABS: 250.0000 mg | ORAL_TABLET | Freq: Every day | ORAL | 0 refills | Status: DC

## 2022-10-08 NOTE — Addendum Note (Signed)
Addended by: Boneta Lucks on: 10/08/2022 04:44 PM   Modules accepted: Orders

## 2022-10-27 ENCOUNTER — Other Ambulatory Visit: Payer: Self-pay | Admitting: Family Medicine

## 2022-10-27 DIAGNOSIS — Z1231 Encounter for screening mammogram for malignant neoplasm of breast: Secondary | ICD-10-CM

## 2022-12-14 ENCOUNTER — Ambulatory Visit
Admission: RE | Admit: 2022-12-14 | Discharge: 2022-12-14 | Disposition: A | Discharge status: Home / Self Care | Payer: Medicare Other | Source: Ambulatory Visit | Attending: Family Medicine | Admitting: Family Medicine

## 2022-12-14 DIAGNOSIS — Z1231 Encounter for screening mammogram for malignant neoplasm of breast: Secondary | ICD-10-CM

## 2023-02-05 ENCOUNTER — Ambulatory Visit (INDEPENDENT_AMBULATORY_CARE_PROVIDER_SITE_OTHER): Payer: Medicare Other | Admitting: Podiatry

## 2023-02-05 ENCOUNTER — Telehealth: Payer: Self-pay | Admitting: Podiatry

## 2023-02-05 ENCOUNTER — Other Ambulatory Visit: Payer: Self-pay | Admitting: Podiatry

## 2023-02-05 DIAGNOSIS — Z79899 Other long term (current) drug therapy: Secondary | ICD-10-CM

## 2023-02-05 DIAGNOSIS — B351 Tinea unguium: Secondary | ICD-10-CM

## 2023-02-05 NOTE — Progress Notes (Signed)
Subjective:  Patient ID: Laura Blackwell, female    DOB: [birth year],  MRN: [id number]  Chief Complaint  Patient presents with   Nail Problem    Nail fungus follow-up, patient denies any pain, TX: Lamisil,      67 y.o. female presents with the above complaint.  Patient presents with thickened elongated dystrophic toenails x 2 consistently onychomycosis.  She wanted to discuss treatment options for it.  She is tried over-the-counter options including Epsom salts or Listerine.  She states that it has been bothering her and discoloration.  She wanted discuss aggressive options she has not seen MRIs prior to seeing me she is a diabetic.   Review of Systems: Negative except as noted in the HPI. Denies N/V/F/Ch.  Past Medical History:  Diagnosis Date   Arthritis    Diabetes mellitus without complication (HCC)    Edema    in left leg in 1980s on left ankle    Hyperlipidemia    Hypertension    Neuropathy    PCOS (polycystic ovarian syndrome)    Spinal cord stroke (HCC)    01/2019   Superficial thrombophlebitis    Swelling    Venous insufficiency    Vitamin D deficiency     Current Outpatient Medications:    atorvastatin (LIPITOR) 20 MG tablet, Take 20 mg by mouth daily. , Disp: , Rfl:    docusate sodium (COLACE) 100 MG capsule, Take 1 capsule (100 mg total) by mouth 2 (two) times daily. While taking narcotic pain medicine., Disp: 30 capsule, Rfl: 0   furosemide (LASIX) 20 MG tablet, Take 20 mg by mouth 2 (two) times daily. , Disp: , Rfl:    gabapentin (NEURONTIN) 300 MG capsule, TAKE 1 CAPSULE BY MOUTH QAM AND TWO TABLETS BY MOUTH QHS, Disp: 270 capsule, Rfl: 3   glucose blood test strip, 1 each by Other route 2 (two) times daily. Use as instructed, Disp: , Rfl:    hyoscyamine (LEVBID) 0.375 MG 12 hr tablet, Take 1 tablet (0.375 mg total) by mouth 2 (two) times daily., Disp: 180 tablet, Rfl: 3   Insulin Pen Needle (BD PEN NEEDLE NANO U/F) 32G X 4 MM MISC, 1 each by Does not apply  route daily., Disp: 100 each, Rfl: 0   metFORMIN (GLUCOPHAGE) 500 MG tablet, Take 1,000 mg by mouth 2 (two) times daily with a meal. , Disp: , Rfl:    MOUNJARO 2.5 MG/0.5ML Pen, Inject into the skin., Disp: , Rfl:    telmisartan (MICARDIS) 80 MG tablet, Take 80 mg by mouth daily., Disp: , Rfl:    terbinafine (LAMISIL) 250 MG tablet, Take 1 tablet (250 mg total) by mouth daily., Disp: 90 tablet, Rfl: 0  Social History   Tobacco Use  Smoking Status Former   Types: Cigarettes   Quit date: 10/01/2002   Years since quitting: 20.3  Smokeless Tobacco Never    Allergies  Allergen Reactions   Jardiance [Empagliflozin]     Yeast infection   Objective:   There were no vitals filed for this visit.  There is no height or weight on file to calculate BMI. Constitutional Well developed. Well nourished.  Vascular Dorsalis pedis pulses palpable bilaterally. Posterior tibial pulses palpable bilaterally. Capillary refill normal to all digits.  No cyanosis or clubbing noted. Pedal hair growth normal.  Neurologic Normal speech. Oriented to person, place, and time. Epicritic sensation to light touch grossly present bilaterally.  Dermatologic Nails thickened and again dystrophic mycotic toenails x 2 mild pain  on palpation Skin within normal limits  Orthopedic: Normal joint ROM without pain or crepitus bilaterally. No visible deformities. No bony tenderness.   Radiographs: None Assessment:   1. Long-term use of high-risk medication   2. Onychomycosis   3. Nail fungus     Plan:  Patient was evaluated and treated and all questions answered.  Bilateral hallux onychomycosis~second round -Educated the patient on the etiology of onychomycosis and various treatment options associated with improving the fungal load.  I explained to the patient that there is 3 treatment options available to treat the onychomycosis including topical, p.o., laser treatment.  Patient elected to undergo p.o. options  with second round of Lamisil/terbinafine therapy.  In order for me to start the medication therapy, I explained to the patient the importance of evaluating the liver and obtaining the liver function test.  Once the liver function test comes back normal I will start him on second round of 28-month course of Lamisil therapy.  Patient understood all risk and would like to proceed with Lamisil therapy.  I have asked the patient to immediately stop the Lamisil therapy if she has any reactions to it and call the office or go to the emergency room right away.  Patient states understanding   No follow-ups on file.

## 2023-02-05 NOTE — Telephone Encounter (Signed)
Patient was seen this morning and got an order for bloodwork at [facility].  She went to [facility] ([facility] which she said was [facility] and the line was really backed up.  She is now heading over to [facility] to have the test done  I can't see the form in the system yet to see if a new order needs completed for [facility], or if they'll honor the one she already has in her hand from this morning.  Please advise.

## 2023-02-05 NOTE — Addendum Note (Signed)
Addended byLucia Estelle D on: 02/05/2023 12:51 PM   Modules accepted: Orders

## 2023-02-05 NOTE — Telephone Encounter (Signed)
Reached out to patient after hearing back from Dr. Allena Katz.  She WILL need a new order reflecting [facility] instead of [facility].    Called patient, but she already left the lab.  So, found out which [facility] she went to, and faxed an updated order for LFT's to them.

## 2023-02-06 LAB — HEPATIC FUNCTION PANEL
ALT: 10 IU/L (ref 0–32)
AST: 12 IU/L (ref 0–40)
Albumin: 4.2 g/dL (ref 3.9–4.9)
Alkaline Phosphatase: 97 IU/L (ref 44–121)
Bilirubin Total: 0.2 mg/dL (ref 0.0–1.2)
Bilirubin, Direct: 0.1 mg/dL (ref 0.00–0.40)
Total Protein: 6.9 g/dL (ref 6.0–8.5)

## 2023-02-09 MED ORDER — TERBINAFINE HCL 250 MG PO TABS: 250.0000 mg | ORAL_TABLET | Freq: Every day | ORAL | 0 refills | Status: DC

## 2023-02-09 NOTE — Addendum Note (Signed)
Addended by: Nicholes Rough on: 02/09/2023 08:08 AM   Modules accepted: Orders

## 2023-03-29 ENCOUNTER — Other Ambulatory Visit: Payer: Self-pay | Admitting: Family Medicine

## 2023-03-29 DIAGNOSIS — E2839 Other primary ovarian failure: Secondary | ICD-10-CM

## 2023-05-24 ENCOUNTER — Other Ambulatory Visit: Payer: Self-pay | Admitting: Neurology

## 2023-05-26 NOTE — Telephone Encounter (Signed)
Last seen on 06/23/22 Follow up scheduled on 06/24/23

## 2023-06-09 ENCOUNTER — Ambulatory Visit (INDEPENDENT_AMBULATORY_CARE_PROVIDER_SITE_OTHER): Payer: Medicare Other | Admitting: Podiatry

## 2023-06-09 ENCOUNTER — Encounter: Payer: Self-pay | Admitting: Podiatry

## 2023-06-09 VITALS — BP 128/83 | HR 87

## 2023-06-09 DIAGNOSIS — Z5321 Procedure and treatment not carried out due to patient leaving prior to being seen by health care provider: Secondary | ICD-10-CM

## 2023-06-09 NOTE — Progress Notes (Signed)
No charge as patient left before being seen

## 2023-06-24 ENCOUNTER — Ambulatory Visit: Payer: Medicare Other | Admitting: Neurology

## 2023-06-24 ENCOUNTER — Encounter: Payer: Self-pay | Admitting: Neurology

## 2023-06-24 VITALS — BP 135/94 | HR 88 | Ht 66.0 in | Wt 275.5 lb

## 2023-06-24 DIAGNOSIS — G959 Disease of spinal cord, unspecified: Secondary | ICD-10-CM

## 2023-06-24 DIAGNOSIS — R200 Anesthesia of skin: Secondary | ICD-10-CM

## 2023-06-24 DIAGNOSIS — R32 Unspecified urinary incontinence: Secondary | ICD-10-CM

## 2023-06-24 MED ORDER — GABAPENTIN 300 MG PO CAPS: ORAL_CAPSULE | ORAL | 3 refills | Status: DC

## 2023-06-24 NOTE — Progress Notes (Signed)
[facility]  PATIENT: Laura Blackwell DOB: [birth year]  REFERRING DOCTOR OR PCP: Merri Brunette, MD SOURCE: Patient, Notes from Dr. Katrinka Blazing, imaging and lab reports, multiple MRIs of the spine and brain personally reviewed  _________________________________   HISTORICAL  CHIEF COMPLAINT:  Chief Complaint  Patient presents with   Room 11    Pt is here Alone. Pt states she is doing well. Pt states that she would like to discuss her getting up 4-6 times at night due to her having to go to the bathroom.     HISTORY OF PRESENT ILLNESS:  Laura Blackwell is a 67 year old woman with a spinal cord lesion.  Update 06/24/2023: She has a T12/L1 transverse myelitis (idiopathic) since 01/2019.      Gabapentin has helped the burning sensation in the left buttock and down.   The right side is milder.    Initially, her right side was worse but it has improved.  She has near-total numbness in the perineum, though there was some recovery on the right.    She takes gabapentin 300 mg qAM and 600 mg po qHS.     She is reluctant to increase the dose or try a different medication (I suggested lamotrigine).    Of note, she is able to feel more in her legs and has less numbness on the right and very slightly less numbness on the left.    She is walking about the same.   She does not always use the cane but always has one in car if she feels weaker.   She needs to hold tigtly on rails on stairs.   She has left leg weakness and some foot drop -- worse if walking >100 feet. .   The left leg needs to be lifted at times on curbs.    She has bowel and urinary urgency and has rare incontinence now (once or twice a week).  The incontinence occurs with very little warning.   She is on hyoscyamine and take it only if going out of the house.   She sometimes wears Depends outside the house.   She always makes sure she has wipes/etc   She was on Mounjoro for DM and is now on a higher dose with some  benefit for weight.  She has edema in her left leg since an operation many years ago.  She usually wears compression stockings (did not today due to exam).    Transverse myelitis history Laura Blackwell  had the onset of lower back pain and leg weakness 02/10/2019. She was at work, when she stood up she fell flat on her face.    She did not get better over the next 2 days and also had urinary incontinence and presented to the ED 02/13/19.  She was noted to have no sensation in the perineum and sacral area.  She had incontinence of bowel and bladder. She had some left leg weakness.    She was admitted and MRI showed an abnormal focus in the conus medullaris and lower spinal cord adjacent to T12-L1.  This was felt to be a demyelinating lesion versus stroke and she received 5 days of high-dose IV steroids.  While in the hospital she had a lumbar puncture.  Oligoclonal bands were not present.   She has had DM x 7 years and has been on insulin x 1 year.   She denies any numbness in her feet or eye or kidney issues related to DM  Imaging:  02/13/2019: MRI of the lumbar spine showed abnormal signal in the conus medullaris.  There is severe facet hypertrophy with 5 mm of anterolisthesis and moderately severe spinal stenosis at L4-L5.  Mild degenerative changes at the other levels. 02/14/2019: MRI of the thoracic (with and without) and lumbar spine (with contrast)  There is a lesion of the conus medullaris and distal thoracic spinal cord.  It does not enhance.   02/16/2019: MRI of the brain and cervical spine.  The brain was normal for age.  The spinal cord was normal.  There was some degenerative changes noted at the C5-C6 with left foraminal narrowing. 02/18/2019: CT angiogram of the head and neck were normal.  05/17/2020 brain MRI was normal  05/17/2020 thoracic spine MRI showed abnormal lesion at L1/conus as before.  No enhancement.    Laboratory tests (2020): NMO-IgG Ab was negative.   HIV negative.   ESR  mildly elevated at 32.  CSF showed no oligoclonal bands.   CSF proteins and glucose mildly elevated.  7 WBC.   CSF VDRL negative.      REVIEW OF SYSTEMS: Constitutional: No fevers, chills, sweats, or change in appetite Eyes: No visual changes, double vision, eye pain Ear, nose and throat: No hearing loss, ear pain, nasal congestion, sore throat Cardiovascular: No chest pain, palpitations Respiratory:  No shortness of breath at rest or with exertion.   No wheezes GastrointestinaI: No nausea, vomiting, diarrhea.  She has fecal urgency  genitourinary:  No dysuria, urinary retention.  Urgency as above  musculoskeletal:  No neck pain, back pain Integumentary: No rash, pruritus, skin lesions Neurological: as above Psychiatric: No depression at this time.  No anxiety Endocrine: No palpitations, diaphoresis, change in appetite, change in weigh or increased thirst.  She has insulin-dependent type 2 diabetes. Hematologic/Lymphatic:  No anemia, purpura, petechiae. Allergic/Immunologic: No itchy/runny eyes, nasal congestion, recent allergic reactions, rashes  ALLERGIES: Allergies  Allergen Reactions   Jardiance [Empagliflozin]     Yeast infection    HOME MEDICATIONS:  Current Outpatient Medications:    atorvastatin (LIPITOR) 20 MG tablet, Take 20 mg by mouth daily. , Disp: , Rfl:    furosemide (LASIX) 20 MG tablet, Take 20 mg by mouth 2 (two) times daily. , Disp: , Rfl:    glucose blood test strip, 1 each by Other route 2 (two) times daily. Use as instructed, Disp: , Rfl:    hyoscyamine (LEVBID) 0.375 MG 12 hr tablet, Take 1 tablet (0.375 mg total) by mouth 2 (two) times daily., Disp: 180 tablet, Rfl: 3   Insulin Pen Needle (BD PEN NEEDLE NANO U/F) 32G X 4 MM MISC, 1 each by Does not apply route daily., Disp: 100 each, Rfl: 0   metFORMIN (GLUCOPHAGE) 500 MG tablet, Take 1,000 mg by mouth 2 (two) times daily with a meal. , Disp: , Rfl:    MOUNJARO 2.5 MG/0.5ML Pen, Inject 12.5 mg into the skin  once a week., Disp: , Rfl:    telmisartan (MICARDIS) 80 MG tablet, Take 80 mg by mouth daily., Disp: , Rfl:    docusate sodium (COLACE) 100 MG capsule, Take 1 capsule (100 mg total) by mouth 2 (two) times daily. While taking narcotic pain medicine., Disp: 30 capsule, Rfl: 0   gabapentin (NEURONTIN) 300 MG capsule, TAKE 1 CAPSULE BY MOUTH IN THE  MORNING, ONE IN AFTERNOON AND 2 CAPSULES BY MOUTH  AT BEDTIME, Disp: 360 capsule, Rfl: 3   terbinafine (LAMISIL) 250 MG tablet, Take 1 tablet (250 mg  total) by mouth daily., Disp: 90 tablet, Rfl: 0   terbinafine (LAMISIL) 250 MG tablet, Take 1 tablet (250 mg total) by mouth daily. (Patient not taking: Reported on 06/24/2023), Disp: 90 tablet, Rfl: 0  PAST MEDICAL HISTORY: Past Medical History:  Diagnosis Date   Arthritis    Diabetes mellitus without complication (HCC)    Edema    in left leg in 1980s on left ankle    Hyperlipidemia    Hypertension    Neuropathy    PCOS (polycystic ovarian syndrome)    Spinal cord stroke (HCC)    01/2019   Superficial thrombophlebitis    Swelling    Venous insufficiency    Vitamin D deficiency     PAST SURGICAL HISTORY: Past Surgical History:  Procedure Laterality Date   BREAST BIOPSY Right    FOOT ARTHRODESIS Right 10/10/2020   Procedure: Right talonavicular and subtalar joint arthrodesis;  Surgeon: Toni Arthurs, MD;  Location: [facility];  Service: Orthopedics;  Laterality: Right;   GASTROC RECESSION EXTREMITY Right 10/10/2020   Procedure: Gastroc recession;  Surgeon: Toni Arthurs, MD;  Location: [facility];  Service: Orthopedics;  Laterality: Right;   JOINT REPLACEMENT  2011    right hip    left ankle surgery      TOTAL HIP ARTHROPLASTY Left 12/02/2015   Procedure: LEFT TOTAL HIP ARTHROPLASTY ANTERIOR APPROACH;  Surgeon: Ollen Gross, MD;  Location: [facility];  Service: Orthopedics;  Laterality: Left;    FAMILY HISTORY: Family History  Problem Relation Age of Onset    Diabetes Mother    Hypertension Mother     SOCIAL HISTORY:  Social History   Socioeconomic History   Marital status: Single    Spouse name: Not on file   Number of children: Not on file   Years of education: Not on file   Highest education level: Not on file  Occupational History   Occupation: Registration Specialist  Tobacco Use   Smoking status: Former    Current packs/day: 0.00    Types: Cigarettes    Quit date: 10/01/2002    Years since quitting: 20.7   Smokeless tobacco: Never  Substance and Sexual Activity   Alcohol use: No   Drug use: No   Sexual activity: Not on file  Other Topics Concern   Not on file  Social History Narrative   Lives with son who is 26 (03/29/19)   Caffeine use: Hot tea daily   Right handed    Social Determinants of Health   Financial Resource Strain: Not on file  Food Insecurity: Not on file  Transportation Needs: Not on file  Physical Activity: Not on file  Stress: Not on file  Social Connections: Not on file  Intimate Partner Violence: Not on file     PHYSICAL EXAM  Vitals:   06/24/23 1007  BP: (!) 135/94  Pulse: 88  Weight: 275 lb 8 oz (125 kg)  Height: 5\' 6"  (1.676 m)     Body mass index is 44.47 kg/m.   General: The patient is well-developed and well-nourished and in no acute distress  HEENT:  Head is Brooke/AT.  Sclera are anicteric.    Skin: Extremities are without rash.  She has severe edema in the left leg (old)  Musculoskeletal:  Back is nontender  Neurologic Exam  Mental status: The patient is alert and oriented x 3 at the time of the examination. The patient has apparent normal recent and remote memory, with an apparently  normal attention span and concentration ability.   Speech is normal.  Cranial nerves: Extraocular movements are full.  Color vision is symmetric.  Normal facial strength.  Trapezius and sternocleidomastoid strength is normal. No dysarthria is noted. No obvious hearing deficits are  noted.  Motor:  Muscle bulk is normal.   Tone is normal. Strength is  5 / 5 in all 4 extremities.  However, with functional testing she has reduced gastrocnemius strength on the left.  Sensory: Sensory testing is intact to pinprick, soft touch and vibration sensation in the arms.  Reduced S1/S2 sensation to touch and temperature, worse on left (absent S1 and lower).   Reduced vibration sensation in right foot relative left.     Coordination: Cerebellar testing reveals good finger-nose-finger and heel-to-shin bilaterally.  Gait and station: Station is normal.   She is able to walk in the room without a cane.   She has a mild left foot drop.  Romberg is negative.  Reflexes: Deep tendon reflexes are symmetric and normal bilaterally.      DIAGNOSTIC DATA (LABS, IMAGING, TESTING) - I reviewed patient records, labs, notes, testing and imaging myself where available.  Lab Results  Component Value Date   WBC 8.0 06/06/2019   HGB 12.9 06/06/2019   HCT 38.6 06/06/2019   MCV 81 06/06/2019   PLT 275 06/06/2019      Component Value Date/Time   NA 140 10/08/2020 1230   NA 141 05/04/2017 0818   K 4.8 10/08/2020 1230   CL 107 10/08/2020 1230   CO2 25 10/08/2020 1230   GLUCOSE 106 (H) 10/08/2020 1230   BUN 15 10/08/2020 1230   BUN 11 05/04/2017 0818   CREATININE 0.54 10/08/2020 1230   CALCIUM 9.9 10/08/2020 1230   PROT 6.9 02/05/2023 1204   ALBUMIN 4.2 02/05/2023 1204   AST 12 02/05/2023 1204   ALT 10 02/05/2023 1204   ALKPHOS 97 02/05/2023 1204   BILITOT 0.2 02/05/2023 1204   GFRNONAA >60 10/08/2020 1230   GFRAA >60 02/15/2019 0454   Lab Results  Component Value Date   CHOL 163 02/18/2019   HDL 60 02/18/2019   LDLCALC 81 02/18/2019   TRIG 110 02/18/2019   CHOLHDL 2.7 02/18/2019   Lab Results  Component Value Date   HGBA1C 7.5 (H) 02/14/2019   Lab Results  Component Value Date   VITAMINB12 513 12/31/2016   Lab Results  Component Value Date   TSH 0.797 06/06/2019        ASSESSMENT AND PLAN    1. Disease of spinal cord (HCC)   2. Urinary incontinence, unspecified type   3. Perineal numbness       1.   She had idiopathic transverse myelitis or spinal cord infarction at T12/L1.  She has sensory symptoms at and below the S1 dermatome, worse on the left associated with dysesthesias she has symptoms from S1 to be low, left greater than right due to the lesion in the lower spinal cord.   No new lesion on follow-up brain MRIs makes MS less likely 2.  She has only partial benefit from gabapentin and I will increase 300 mg to 1-1-2 pills from 1-2. 3.   Refer to urogynecology.   For now will continue hyoscyamine 0.375 bid (and ok to go to [place] if traveling) to try to help both bowel and bladder  4.   RTC 6 months or sooner if new or worsening symptoms  40-minute office visit with the majority of the time  spent face-to-face for history and physical, discussion/counseling and decision-making.  Additional time with record review and documentation.  Keri Tavella A. Epimenio Foot, MD, [facility] 06/24/2023, 10:26 AM Certified in Neurology, Clinical Neurophysiology, Sleep Medicine and Neuroimaging  [facility] [address], [address] [place], [place] [address] [phone]

## 2023-07-14 ENCOUNTER — Telehealth: Payer: Self-pay | Admitting: Neurology

## 2023-07-14 NOTE — Telephone Encounter (Signed)
[place] can look into this?

## 2023-07-14 NOTE — Telephone Encounter (Signed)
Pt is f/u on her referral re: what Dr Epimenio Foot informed her he would initiate for her re: her Pelvic floor. Pt was [facility] Dr Epimenio Foot [facility] the order would be sent but she has not been contacted by anyone, please look into and reach out to pt.

## 2023-08-31 NOTE — Telephone Encounter (Signed)
Pt scheduled 09/28/23 at 8:am [facility]

## 2023-09-28 ENCOUNTER — Encounter: Payer: Self-pay | Admitting: Obstetrics and Gynecology

## 2023-09-28 ENCOUNTER — Ambulatory Visit (INDEPENDENT_AMBULATORY_CARE_PROVIDER_SITE_OTHER): Payer: Medicare Other | Admitting: Obstetrics and Gynecology

## 2023-09-28 VITALS — BP 119/80 | HR 94 | Ht 65.0 in | Wt 269.8 lb

## 2023-09-28 DIAGNOSIS — N3281 Overactive bladder: Secondary | ICD-10-CM | POA: Diagnosis not present

## 2023-09-28 DIAGNOSIS — M6289 Other specified disorders of muscle: Secondary | ICD-10-CM | POA: Diagnosis not present

## 2023-09-28 DIAGNOSIS — N8110 Cystocele, unspecified: Secondary | ICD-10-CM | POA: Diagnosis not present

## 2023-09-28 DIAGNOSIS — N952 Postmenopausal atrophic vaginitis: Secondary | ICD-10-CM

## 2023-09-28 DIAGNOSIS — N3941 Urge incontinence: Secondary | ICD-10-CM | POA: Diagnosis not present

## 2023-09-28 MED ORDER — VIBEGRON 75 MG PO TABS: 75.0000 mg | ORAL_TABLET | Freq: Every day | ORAL | 5 refills | Status: DC

## 2023-09-28 NOTE — Progress Notes (Unsigned)
[facility] New Patient Evaluation and Consultation  Referring Provider: Asa Lente, MD PCP: Merri Brunette, MD Date of Service: 09/28/2023  SUBJECTIVE Chief Complaint: New Patient (Initial Visit) (Laura Blackwell is a 68 y.o. female is here for transverse myelitis with urinary incontinence)  History of Present Illness: Laura Blackwell is a 68 y.o. Black or African-American female seen in consultation at the request of Dr. Epimenio Foot for evaluation of incontinence.    Review of records significant for:   Note from Dr. Epimenio Foot 06/24/23: She has bowel and urinary urgency and has rare incontinence now (once or twice a week).  The incontinence occurs with very little warning.   She is on hyoscyamine and take it only if going out of the house.   She sometimes wears Depends outside the house.   She always makes sure she has wipes/etc.  Sensory: Sensory testing is intact to pinprick, soft touch and vibration sensation in the arms.  Reduced S1/S2 sensation to touch and temperature, worse on left (absent S1 and lower).   Reduced vibration sensation in right foot relative left.     MRI 2021:   IMPRESSION: Abnormal signal in the spinal cord at L1 without abnormal enhancement unchanged from the prior study. Possible chronic demyelinating disease including multiple sclerosis and transverse myelitis. No other cord lesion identified.   Mild lumbar degenerative change without significant spinal stenosis.  Electronically Signed   By: Marlan Palau M.D.   On: 05/18/2020 09:52  Urinary Symptoms: Leaks urine with with a full bladder, with movement to the bathroom, and with urgency Leaks 0-1 time(s) per days.  Pad use: 1-2 adult diapers per day.   Patient is bothered by UI symptoms.  Day time voids 4-5.  Nocturia: 4-5 times per night to void. Voiding dysfunction:  empties bladder well.  Patient does not use a catheter to empty bladder.  When urinating, patient feels  difficulty starting urine stream Drinks: Spring Water, Fresh juice (Celery juice) per day  UTIs: 0 UTI's in the last year.   Denies history of blood in urine, kidney or bladder stones, pyelonephritis, bladder cancer, and kidney cancer No results found for the last 90 days.   Pelvic Organ Prolapse Symptoms:                  Patient Denies a feeling of a bulge the vaginal area. Patient Denies seeing a bulge.    Bowel Symptom: Bowel movements: 1-3 time(s) per day Stool consistency: soft  Straining: no.  Splinting: no.  Incomplete evacuation: no.  Patient Admits to accidental bowel leakage / fecal incontinence  Occurs: 2-3 time(s) per year  Consistency with leakage: soft  Bowel regimen: fiber and miralax Last colonoscopy: Date Nov. 2024, Results WNL [facility] Colonoscopy          Upcoming     Colonoscopy (Every 10 Years) Next due on 10/12/2028    10/12/2018  Outside Claim: PR COLSC FLX W/RMVL OF TUMOR POLYP LESION SNARE TQ   Only the first 1 history entries have been loaded, but more history exists.                Sexual Function Sexually active: no.  Sexual orientation: Straight Pain with sex: No  Pelvic Pain Denies pelvic pain    Past Medical History:  Past Medical History:  Diagnosis Date  . Arthritis   . Diabetes mellitus without complication (HCC)   . Edema    in left leg in 1980s on left  ankle   . Hyperlipidemia   . Hypertension   . Neuropathy   . PCOS (polycystic ovarian syndrome)   . Spinal cord stroke (HCC)    01/2019  . Superficial thrombophlebitis   . Swelling   . Venous insufficiency   . Vitamin D deficiency      Past Surgical History:   Past Surgical History:  Procedure Laterality Date  . BREAST BIOPSY Right   . FOOT ARTHRODESIS Right 10/10/2020   Procedure: Right talonavicular and subtalar joint arthrodesis;  Surgeon: Toni Arthurs, MD;  Location: [facility];  Service: Orthopedics;  Laterality: Right;  . GASTROC RECESSION  EXTREMITY Right 10/10/2020   Procedure: Gastroc recession;  Surgeon: Toni Arthurs, MD;  Location: [facility];  Service: Orthopedics;  Laterality: Right;  . JOINT REPLACEMENT  2011    right hip   . left ankle surgery     . TOTAL HIP ARTHROPLASTY Left 12/02/2015   Procedure: LEFT TOTAL HIP ARTHROPLASTY ANTERIOR APPROACH;  Surgeon: Ollen Gross, MD;  Location: [facility];  Service: Orthopedics;  Laterality: Left;     Past OB/GYN History: [id number] Vaginal deliveries: 2,  Forceps/ Vacuum deliveries: 0, Cesarean section: 0 Menopausal: Yes, at age 68 Contraception: None. Last pap smear was 2020.  Any history of abnormal pap smears: no. HM PAP   This patient has no relevant Health Maintenance data.     Medications: Patient has a current medication list which includes the following prescription(s): atorvastatin, furosemide, gabapentin, glucose blood, hyoscyamine, insulin pen needle, metformin, mounjaro, telmisartan, and vibegron.   Allergies: Patient is allergic to jardiance [empagliflozin].   Social History:  Social History   Tobacco Use  . Smoking status: Former    Current packs/day: 0.00    Types: Cigarettes    Quit date: 10/01/2002    Years since quitting: 21.0  . Smokeless tobacco: Never  Vaping Use  . Vaping status: Never Used  Substance Use Topics  . Alcohol use: No  . Drug use: No    Relationship status: single Patient lives alone.   Patient is not employed. Regular exercise: Yes: Aquatic exercises and some strength training History of abuse: No  Family History:   Family History  Problem Relation Age of Onset  . Diabetes Mother   . Hypertension Mother      Review of Systems: Review of Systems  Constitutional:  Negative for chills and fever.       +Weight Gain  Respiratory:  Negative for cough and shortness of breath.   Cardiovascular:  Negative for chest pain and palpitations.  Gastrointestinal:  Negative for abdominal pain, blood in stool,  constipation and diarrhea.  Skin:  Negative for rash.  Neurological:  Negative for weakness.  Psychiatric/Behavioral:  Negative for depression and suicidal ideas.      OBJECTIVE Physical Exam: Vitals:   09/28/23 1006  BP: 119/80  Pulse: 94  Weight: 269 lb 12.8 oz (122.4 kg)  Height: 5\' 5"  (1.651 m)    Physical Exam Vitals reviewed. Exam conducted with a chaperone present.  Constitutional:      Appearance: Normal appearance.  Pulmonary:     Effort: Pulmonary effort is normal.  Abdominal:     Palpations: Abdomen is soft.  Skin:    General: Skin is warm and dry.  Neurological:     General: No focal deficit present.     Mental Status: She is alert and oriented to person, place, and time.  Psychiatric:  Mood and Affect: Mood normal.        Behavior: Behavior normal. Behavior is cooperative.        Thought Content: Thought content normal.     GU / Detailed Urogynecologic Evaluation:  Pelvic Exam: Normal external female genitalia; Bartholin's and Skene's glands normal in appearance; urethral meatus normal in appearance, no urethral masses or discharge.   CST: negative  Speculum exam reveals normal vaginal mucosa with atrophy. Cervix normal appearance. Uterus normal single, nontender. Adnexa normal adnexa.     With apex supported, anterior compartment defect was present  Pelvic floor strength I/V, poor muscle coordination   Pelvic floor musculature: Right levator non-tender, Right obturator non-tender, Left levator non-tender, Left obturator non-tender  POP-Q:   POP-Q  -1                                            Aa   -1                                           Ba  -4                                              C   3.5                                            Gh  3                                            Pb  9                                            tvl   -2                                            Ap  -2                                             Bp  -7                                              D      Rectal Exam:  Normal external exam  Post-Void Residual (PVR) by Bladder Scan: In order to evaluate bladder emptying, we discussed obtaining a postvoid residual and patient agreed to this procedure.  Procedure: The ultrasound unit was placed on the patient's abdomen in the suprapubic region  after the patient had voided.    Post Void Residual - 09/28/23 1042       Post Void Residual   Post Void Residual 34 mL              Laboratory Results: Lab Results  Component Value Date   BILIRUBINUR NEGATIVE 11/26/2015   PROTEINUR NEGATIVE 11/26/2015   UROBILINOGEN 0.2 04/21/2010   LEUKOCYTESUR TRACE (A) 11/26/2015    Lab Results  Component Value Date   CREATININE 0.54 10/08/2020   CREATININE 0.54 02/15/2019   CREATININE 0.69 02/14/2019    Lab Results  Component Value Date   HGBA1C 7.5 (H) 02/14/2019    Lab Results  Component Value Date   HGB 12.9 06/06/2019     ASSESSMENT AND PLAN Ms. Zou is a 68 y.o. with:  1. Urge incontinence   2. OAB (overactive bladder)   3. Pelvic floor dysfunction in female   4. Prolapse of anterior vaginal wall   5. Vaginal atrophy    We discussed the symptoms of overactive bladder (OAB), which include urinary urgency, urinary frequency, nocturia, with or without urge incontinence.  While we do not know the exact etiology of OAB, several treatment options exist. We discussed management including behavioral therapy (decreasing bladder irritants, urge suppression strategies, timed voids, bladder retraining), physical therapy, medication; for refractory cases posterior tibial nerve stimulation, sacral neuromodulation, and intravesical botulinum toxin injection. For anticholinergic medications, we discussed the potential side effects of anticholinergics including dry eyes, dry mouth, constipation, cognitive impairment and urinary retention. For Beta-3 agonist  medication, we discussed the potential side effect of elevated blood pressure which is more likely to occur in individuals with uncontrolled hypertension. Patient was very motivated and believes heavily in physical therapy. I think it is reasonable to start with pelvic floor PT to assist in her muscle coordination and strengthening the muscles. Due to her prior spinal stroke she has a hx of PT and reports she takes it very seriously. Referral placed for pelvic floor PT with Eulis Foster. For medication management I have started her on Gemtesa 75mg  daily. She is not a good candidate for anticholinergics due to her age being over the age of 70 and she has not had good symptom relief with hycosamine. Due to her history of spinal stroke, would also like to avoid Myrbetriq as this could cause an increase in blood pressure. Samples of Gemtesa 75mg  daily given today as we may need a prior authorization.  Patient has poor muscle coordination and decreased sensation on the left side of the vagina. Pelvic floor PT will be helpful for this.  Patient has stage II/IV anterior vaginal prolapse with stage I/IV uterine prolapse. Patient had initially reported she was asymptomatic but when we were talking she said she can feel a bulge when she sits on the toilet. We discussed starting with PT and if it is more bothersome we can do a pessary. She is open to the idea of a pessary if needed, as she is very holistic. We discussed that surgical repair would be my last suggestion due to her history.   Patient to use coconut oil or vitamin E cream in the vagina for vaginal atrophy. She is not currently planning to be sexually active but would like to keep that option open in the future.   Patient to follow up in 6 weeks for medication check or sooner if needed.     Selmer Dominion, NP

## 2023-09-28 NOTE — Patient Instructions (Addendum)
Today we talked about ways to manage bladder urgency such as altering your diet to avoid irritative beverages and foods (bladder diet) as well as attempting to decrease stress and other exacerbating factors.  You can chew 2-3 tums before you have bladder irritants to reduce the acidity making it to the bladder.   The Most Bothersome Foods* The Least Bothersome Foods*  Coffee - Regular & Decaf Tea - caffeinated Carbonated beverages - cola, non-colas, diet & caffeine-free Alcohols - Beer, Red Wine, White Wine, [address] - Grapefruit, [place], Orange, [employer] - Cranberry, Grapefruit, Orange, Pineapple Vegetables - Tomato & Tomato Products Flavor Enhancers - Hot peppers, Spicy foods, Chili, Horseradish, Vinegar, Monosodium glutamate (MSG) Artificial Sweeteners - NutraSweet, Sweet 'N Low, Equal (sweetener), Saccharin Ethnic foods - [place], [place], [place] food [employer] - low-fat & whole Fruits - Bananas, Blueberries, Honeydew melon, Pears, Raisins, Watermelon Vegetables - Broccoli, [address] Sprouts, [place], Carrots, Cauliflower, [place], Cucumber, Mushrooms, Peas, Radishes, Squash, Zucchini, White potatoes, Sweet potatoes & yams Poultry - Chicken, Eggs, [place], [employer] - Beef, Diplomatic Services operational officer, Lamb Seafood - Shrimp, [place] fish, Salmon Grains - Oat, Rice Snacks - Pretzels, Popcorn  *Lenward Chancellor et al. Diet and its role in interstitial cystitis/bladder pain syndrome (IC/BPS) and comorbid conditions. [employer]. BJU Int. 2012 Jan 11.    You can use coconut oil in the vagina or vitamin E oil for lubrication. Use this at night for lubrication.   You have stage 2 out of 4 anterior vaginal prolapse (Bladder wall). We will start with pelvic floor PT.   For the overactive bladder symptoms stop the Hycosamine and start Vibegron

## 2023-09-29 ENCOUNTER — Encounter: Payer: Self-pay | Admitting: Obstetrics and Gynecology

## 2023-10-12 ENCOUNTER — Other Ambulatory Visit: Payer: Self-pay

## 2023-10-12 DIAGNOSIS — N3281 Overactive bladder: Secondary | ICD-10-CM

## 2023-10-12 DIAGNOSIS — N3941 Urge incontinence: Secondary | ICD-10-CM

## 2023-10-12 MED ORDER — VIBEGRON 75 MG PO TABS: 75.0000 mg | ORAL_TABLET | Freq: Every day | ORAL | 3 refills | Status: AC

## 2023-10-12 NOTE — Progress Notes (Signed)
90 day sent in per the patients request to [facility]

## 2023-11-01 ENCOUNTER — Other Ambulatory Visit: Payer: Self-pay | Admitting: Family Medicine

## 2023-11-01 DIAGNOSIS — Z0000 Encounter for general adult medical examination without abnormal findings: Secondary | ICD-10-CM

## 2023-11-09 ENCOUNTER — Encounter: Payer: Self-pay | Admitting: Obstetrics and Gynecology

## 2023-11-09 ENCOUNTER — Ambulatory Visit (INDEPENDENT_AMBULATORY_CARE_PROVIDER_SITE_OTHER): Payer: Medicare Other | Admitting: Obstetrics and Gynecology

## 2023-11-09 ENCOUNTER — Telehealth: Payer: Self-pay | Admitting: Neurology

## 2023-11-09 VITALS — BP 139/83 | HR 80

## 2023-11-09 DIAGNOSIS — N3281 Overactive bladder: Secondary | ICD-10-CM

## 2023-11-09 DIAGNOSIS — N3941 Urge incontinence: Secondary | ICD-10-CM | POA: Diagnosis not present

## 2023-11-09 NOTE — Patient Instructions (Signed)
 Continue Gemtesa Continue using the coconut oil in the vagina  For the fruit continue to stop it at 6pm

## 2023-11-09 NOTE — Progress Notes (Signed)
 [facility] Return Visit  SUBJECTIVE  History of Present Illness: Ellajane Stong Archibald is a 68 y.o. female seen in follow-up for OAB. Plan at last visit was start PT and start Gemtesa 75mg  daily. Patient has a PT evaluation in April as that was the first available with Elnita Maxwell.   Patient reports she feels like the Gemtesa 75mg  is helping her.     Patient reports she has also been working on weight loss and has lost approximately 20lbs since her last appointment. She is doing a fruit heavy diet with no meat or flour based products.   Patient also endorses she has had fecal incontinence since she was last here related to the high amounts of fiber intake.   Past Medical History: Patient  has a past medical history of Arthritis, Diabetes mellitus without complication (HCC), Edema, Hyperlipidemia, Hypertension, Neuropathy, PCOS (polycystic ovarian syndrome), Spinal cord stroke (HCC), Superficial thrombophlebitis, Swelling, Venous insufficiency, and Vitamin D deficiency.   Past Surgical History: She  has a past surgical history that includes left ankle surgery ; Joint replacement (2011 ); Total hip arthroplasty (Left, 12/02/2015); Breast biopsy (Right); Gastroc recession extremity (Right, 10/10/2020); and Foot arthrodesis (Right, 10/10/2020).   Medications: She has a current medication list which includes the following prescription(s): atorvastatin, furosemide, gabapentin, glucose blood, hyoscyamine, insulin pen needle, metformin, mounjaro, telmisartan, and vibegron.   Allergies: Patient is allergic to jardiance [empagliflozin].   Social History: Patient  reports that she quit smoking about 21 years ago. Her smoking use included cigarettes. She has never used smokeless tobacco. She reports that she does not drink alcohol and does not use drugs.     OBJECTIVE     Physical Exam: Vitals:   11/09/23 0855  BP: 139/83  Pulse: 80   Gen: No apparent distress, A&O x 3.  Detailed  Urogynecologic Evaluation:  Deferred.    ASSESSMENT AND PLAN    Ms. Amspacher is a 68 y.o. with:  1. Urge incontinence   2. OAB (overactive bladder)    Patient's Leslye Peer is reportedly 100$ for a 90 day supply. This is expensive but currently within her affordable range. She will continue Gemtesa 75mg  daily.  Patient is set to start PT in April. Will follow up with patient after she has had some time to do therapy and see how her symptoms are.   Patient to follow up in 3 months or sooner if needed.   Selmer Dominion, NP

## 2023-11-09 NOTE — Telephone Encounter (Signed)
 Took paperwork from patient for [employer] and Pt said [employer]. Pt aware form will not be processed until payment receieved.

## 2023-11-10 ENCOUNTER — Telehealth: Payer: Self-pay | Admitting: *Deleted

## 2023-11-10 NOTE — Telephone Encounter (Signed)
 Pt [place] form faxed on 11/10/2023

## 2023-12-01 ENCOUNTER — Ambulatory Visit
Admission: RE | Admit: 2023-12-01 | Discharge: 2023-12-01 | Disposition: A | Discharge status: Home / Self Care | Payer: Medicare Other | Source: Ambulatory Visit | Attending: Family Medicine | Admitting: Family Medicine

## 2023-12-01 DIAGNOSIS — E2839 Other primary ovarian failure: Secondary | ICD-10-CM

## 2023-12-13 ENCOUNTER — Ambulatory Visit: Payer: Medicare Other | Attending: Obstetrics and Gynecology | Admitting: Physical Therapy

## 2023-12-13 ENCOUNTER — Other Ambulatory Visit: Payer: Self-pay

## 2023-12-13 ENCOUNTER — Encounter: Payer: Self-pay | Admitting: Physical Therapy

## 2023-12-13 DIAGNOSIS — M6281 Muscle weakness (generalized): Secondary | ICD-10-CM | POA: Diagnosis present

## 2023-12-13 DIAGNOSIS — R278 Other lack of coordination: Secondary | ICD-10-CM | POA: Diagnosis present

## 2023-12-13 DIAGNOSIS — M6289 Other specified disorders of muscle: Secondary | ICD-10-CM | POA: Diagnosis present

## 2023-12-13 NOTE — Therapy (Signed)
 OUTPATIENT PHYSICAL THERAPY FEMALE PELVIC EVALUATION   Patient Name: Laura Blackwell MRN: [id number] DOB:1956-06-10, 68 y.o., female Today's Date: 12/13/2023  END OF SESSION:  PT End of Session - 12/13/23 1108     Visit Number 1    Date for PT Re-Evaluation 03/06/24    Authorization Type [employer] medicare    PT Start Time 1100    PT Stop Time 1140    PT Time Calculation (min) 40 min    Activity Tolerance Patient tolerated treatment well    Behavior During Therapy WFL for tasks assessed/performed             Past Medical History:  Diagnosis Date   Arthritis    Diabetes mellitus without complication (HCC)    Edema    in left leg in 1980s on left ankle    Hyperlipidemia    Hypertension    Neuropathy    PCOS (polycystic ovarian syndrome)    Spinal cord stroke (HCC)    01/2019   Superficial thrombophlebitis    Swelling    Venous insufficiency    Vitamin D deficiency    Past Surgical History:  Procedure Laterality Date   BREAST BIOPSY Right    FOOT ARTHRODESIS Right 10/10/2020   Procedure: Right talonavicular and subtalar joint arthrodesis;  Surgeon: Toni Arthurs, MD;  Location: [facility];  Service: Orthopedics;  Laterality: Right;   GASTROC RECESSION EXTREMITY Right 10/10/2020   Procedure: Gastroc recession;  Surgeon: Toni Arthurs, MD;  Location: [facility];  Service: Orthopedics;  Laterality: Right;   JOINT REPLACEMENT  2011    right hip    left ankle surgery      TOTAL HIP ARTHROPLASTY Left 12/02/2015   Procedure: LEFT TOTAL HIP ARTHROPLASTY ANTERIOR APPROACH;  Surgeon: Ollen Gross, MD;  Location: [facility];  Service: Orthopedics;  Laterality: Left;   Patient Active Problem List   Diagnosis Date Noted   Disease of spinal cord (HCC) 04/01/2020   Numbness 04/01/2020   Dysesthesia 03/29/2019   Gait disturbance 03/29/2019   Spinal cord infarction (HCC) 02/20/2019   Cauda equina syndrome (HCC) 02/13/2019   DM2 (diabetes mellitus, type 2)  (HCC) 02/13/2019   Syncope 02/13/2019   Essential hypertension 02/13/2019   Class 3 severe obesity with serious comorbidity and body mass index (BMI) of 45.0 to 49.9 in adult (HCC) 10/04/2017   Class 3 severe obesity without serious comorbidity with body mass index (BMI) of 45.0 to 49.9 in adult (HCC) 04/12/2017   Vitamin D deficiency 04/12/2017   Other fatigue 12/31/2016   SOB (shortness of breath) on exertion 12/31/2016   Type 2 diabetes mellitus without complication, without long-term current use of insulin (HCC) 12/31/2016   Morbid obesity (HCC) 12/31/2016   OA (osteoarthritis) of hip 12/02/2015    PCP: Merri Brunette, MD  REFERRING PROVIDER: Selmer Dominion, NP   REFERRING DIAG:  N39.41 (ICD-10-CM) - Urge incontinence  N32.81 (ICD-10-CM) - OAB (overactive bladder)  M62.89 (ICD-10-CM) - Pelvic floor dysfunction in female  N81.10 (ICD-10-CM) - Prolapse of anterior vaginal wall    THERAPY DIAG:  Muscle weakness (generalized) - Plan: PT plan of care cert/re-cert  Other lack of coordination - Plan: PT plan of care cert/re-cert  Pelvic floor dysfunction in female - Plan: PT plan of care cert/re-cert  Rationale for Evaluation and Treatment: Rehabilitation  ONSET DATE: 02/10/2019  SUBJECTIVE:  SUBJECTIVE STATEMENT: Patient had a spinal stroke on 02/10/2019 and is when the symptoms have begun. Patient gets a burning sensation in the buttocks and not able to sit on her buttocks. Patient notices her bladder was bulging. Patient gets more control as time goes on. She has no control with urine and fecal. Patient feels her nerves are regenerating she is getting more control. Nerve pain on left side of buttocks and left calf.  Fluid intake:   PAIN:  Are you having pain? No  PRECAUTIONS: None  RED  FLAGS: None   WEIGHT BEARING RESTRICTIONS: No  FALLS:  Has patient fallen in last 6 months? No  OCCUPATION: retired  ACTIVITY LEVEL : goes to the [employer] 3 days per week  PLOF: Independent  PATIENT GOALS: improve the leakage and gain control of the bladder to improve the quality of life  PERTINENT HISTORY:  Diabetes; PCOS; Hypertension; bil. Hip replacement; spinal stroke Sexual abuse: No  BOWEL MOVEMENT: Pain with bowel movement: No Type of bowel movement:Type (Bristol Stool Scale) Type 1 or Type 4, Frequency daily multiple times, Strain once in awhile, and Splinting none Fully empty rectum: No, will wipe the anus to help the stool comes out Leakage: Yes: happens as she is walking to the bathroom Pads: No, wear them for a rode trip Fiber supplement/laxative No  URINATION: Pain with urination: No Fully empty bladder: Yes: most of the time, sometimes she will have to urinate again before she leaves the bathroom Stream: Strong and Weak Urgency: Yes  Frequency: average, will not drink fluid when she has to go out Leakage: Urge to void and Walking to the bathroom Pads: Yes: only when she is going on a road trip  INTERCOURSE: not active   PREGNANCY: Vaginal deliveries 2 Tearing No   PROLAPSE: Bulge   OBJECTIVE:  Note: Objective measures were completed at Evaluation unless otherwise noted.  DIAGNOSTIC FINDINGS:  stage II/IV anterior vaginal prolapse with stage I/IV uterine prolapse   PATIENT SURVEYS:  CRAIQ-7: 38 UIQ-7: 38 PFIQ-7: 77  COGNITION: Overall cognitive status: Within functional limits for tasks assessed     SENSATION: Light touch: Deficits decreased sensation of the bottom of the left foot   GAIT: Assistive device utilized: None  POSTURE: No Significant postural limitations   LUMBARAROM/PROM:lumbar ROM is full   LOWER EXTREMITY ROM:  Passive ROM Right eval Left eval  Hip internal rotation 10 10   (Blank rows = not tested)  LOWER  EXTREMITY MMT:  MMT Right eval Left eval  Hip flexion 4/5 4/5  Hip extension 4/5 4/5  Hip abduction 3-/5 3/5  Hip adduction 5/5 5/5   (Blank rows = not tested) PALPATION:   General: Patient has lymphedema in the left leg  Pelvic Alignment: ASIS are equal  Abdominal: some difficulty with contracting the lower abdomen due to trying to lift the rib cage.                 External Perineal Exam: perineal area intact, tightness along the perineal body                             Internal Pelvic Floor: decreased sensation of the left side of the vulvar area  Patient confirms identification and approves PT to assess internal pelvic floor and treatment Yes  PELVIC MMT:   MMT eval  Vaginal 2/5 with tightness on the sides of the introitus  Internal Anal Sphincter 2/5  External Anal Sphincter 2/5  Puborectalis 2/5 and needed taping of the  muscles to come forward  Diastasis Recti none  (Blank rows = not tested)        TONE: average  PROLAPSE: Anterior wall weakness coming to the introitus when patient bears down  TODAY'S TREATMENT:                                                                                                                              DATE: 12/13/23  EVAL Examination completed, findings reviewed, pt educated on POC, HEP, and female pelvic floor anatomy, reasoning with pelvic floor assessment internally with pt consent, and abdominal massage. Pt motivated to participate in PT and agreeable to attempt recommendations.     PATIENT EDUCATION:  12/13/23 Education details: Access Code: [email or web address] Person educated: Patient Education method: Facilities manager, Actor cues, Verbal cues, and Handouts Education comprehension: verbalized understanding, returned demonstration, verbal cues required, tactile cues required, and needs further education  HOME EXERCISE PROGRAM: 12/13/23 Access Code: [email or web address] URL: [email or web address] Date: 12/13/2023 Prepared by:  Marsha Skeen  Exercises - Supine Pelvic Floor Contraction  - 3 x daily - 7 x weekly - 10 reps - 10 sec hold  ASSESSMENT:  CLINICAL IMPRESSION: Patient is a 68 y.o. female who was seen today for physical therapy evaluation and treatment for fecal incontinence, urge incontinence, anterior wall prolapse, and pelvic floor dysfunction. Patient had a spinal stroke on  02/10/2019 and since then she had issues with urinary and fecal leakage and prolapse. She continues to have nerve function to return slowly and this helps her pelvic floor issues. Patient has fecal leakage as she is walking to the bathroom after the urge. She will leak urine with the urge to urinate and as she is walking to the bathroom. Her pelvic floor strength is 2/5 with difficulty in feeling the contraction. She has decreased sensation of the left vulvar area. Her puborectalis does not come forward well. Patient will wear a pad when going on a long road trip. She will limit her going out in the community due to the fear of leakage. Patient will benefit from skilled therapy to improve pelvic floor strength and coordination to reduce her leakage.   OBJECTIVE IMPAIRMENTS: decreased coordination, decreased endurance, decreased strength, and increased fascial restrictions.   ACTIVITY LIMITATIONS: continence, toileting, and locomotion level  PARTICIPATION LIMITATIONS: driving, shopping, and community activity  PERSONAL FACTORS: Age, Time since onset of injury/illness/exacerbation, and 3+ comorbidities: Diabetes; PCOS; Hypertension; bil. Hip replacement; spinal stroke  are also affecting patient's functional outcome.   REHAB POTENTIAL: Good  CLINICAL DECISION MAKING: Unstable/unpredictable  EVALUATION COMPLEXITY: High   GOALS: Goals reviewed with patient? Yes  SHORT TERM GOALS: Target date: 01/10/24  Patient independent with initial HEP for pelvic floor contraction.  Baseline: Goal status: INITIAL  2.  Patient is able to feel the  pelvic floor contraction to understands how to strengthen.  Baseline:  Goal status: INITIAL  3.  Patient was educated in the urge to void.  Baseline:  Goal status: INITIAL   LONG TERM GOALS: Target date: 03/06/24  Patient independent with advanced HEP for core and pelvic floor strength.  Baseline:  Goal status: INITIAL  2.  Patient rectal strength increased >/= 3/5 with the puborectalis coming forward to reduce her fecal leakage.  Baseline:  Goal status: INITIAL  3.  Patient is able to have the urge to urinate and walk to the bathroom with minimal to no urinary leakage.  Baseline:  Goal status: INITIAL  4.  Patient is able to have the urge to defecate and walk to the bathroom with minimal to no fecal leakage.  Baseline:  Goal status: INITIAL  5.  PFIQ-7 score goes from  77 to </= 25 points due to feeling she is able to go out in the community without the fear of leakage.  Baseline:  Goal status: INITIAL   PLAN:  PT FREQUENCY: 1-2x/week  PT DURATION: 12 weeks  PLANNED INTERVENTIONS: 97110-Therapeutic exercises, 97530- Therapeutic activity, 97112- Neuromuscular re-education, 97535- Self Care, 40981- Manual therapy, G0283- Electrical stimulation (unattended), Patient/Family education, and Biofeedback  PLAN FOR NEXT SESSION: RUSI to show her how the pelvic floor moves, diaphragmatic breathing, pelvic floor strength exercises   Marsha Skeen, PT 12/13/23 4:06 PM

## 2023-12-15 ENCOUNTER — Ambulatory Visit
Admission: RE | Admit: 2023-12-15 | Discharge: 2023-12-15 | Disposition: A | Discharge status: Home / Self Care | Source: Ambulatory Visit | Attending: Family Medicine | Admitting: Family Medicine

## 2023-12-15 DIAGNOSIS — Z0000 Encounter for general adult medical examination without abnormal findings: Secondary | ICD-10-CM

## 2023-12-20 ENCOUNTER — Encounter: Payer: Self-pay | Admitting: Physical Therapy

## 2023-12-20 ENCOUNTER — Ambulatory Visit: Admitting: Physical Therapy

## 2023-12-20 DIAGNOSIS — M6281 Muscle weakness (generalized): Secondary | ICD-10-CM

## 2023-12-20 DIAGNOSIS — R278 Other lack of coordination: Secondary | ICD-10-CM

## 2023-12-20 DIAGNOSIS — M6289 Other specified disorders of muscle: Secondary | ICD-10-CM

## 2023-12-20 NOTE — Therapy (Signed)
 OUTPATIENT PHYSICAL THERAPY FEMALE PELVIC TREATMENT   Patient Name: Laura Blackwell MRN: [id number] DOB:May 20, 1956, 68 y.o., female Today's Date: 12/20/2023  END OF SESSION:  PT End of Session - 12/20/23 1017     Visit Number 2    Date for PT Re-Evaluation 03/06/24    Authorization Type [employer] medicare    Authorization Time Period 12/13/2023 - 03/06/2024    Authorization - Visit Number 1    Authorization - Number of Visits 12    Progress Note Due on Visit 10    PT Start Time 1015    PT Stop Time 1055    PT Time Calculation (min) 40 min    Activity Tolerance Patient tolerated treatment well    Behavior During Therapy WFL for tasks assessed/performed             Past Medical History:  Diagnosis Date   Arthritis    Diabetes mellitus without complication (HCC)    Edema    in left leg in 1980s on left ankle    Hyperlipidemia    Hypertension    Neuropathy    PCOS (polycystic ovarian syndrome)    Spinal cord stroke (HCC)    01/2019   Superficial thrombophlebitis    Swelling    Venous insufficiency    Vitamin D  deficiency    Past Surgical History:  Procedure Laterality Date   BREAST BIOPSY Right    FOOT ARTHRODESIS Right 10/10/2020   Procedure: Right talonavicular and subtalar joint arthrodesis;  Surgeon: Amada Backer, MD;  Location: [facility];  Service: Orthopedics;  Laterality: Right;   GASTROC RECESSION EXTREMITY Right 10/10/2020   Procedure: Gastroc recession;  Surgeon: Amada Backer, MD;  Location: [facility];  Service: Orthopedics;  Laterality: Right;   JOINT REPLACEMENT  2011    right hip    left ankle surgery      TOTAL HIP ARTHROPLASTY Left 12/02/2015   Procedure: LEFT TOTAL HIP ARTHROPLASTY ANTERIOR APPROACH;  Surgeon: Liliane Rei, MD;  Location: [facility];  Service: Orthopedics;  Laterality: Left;   Patient Active Problem List   Diagnosis Date Noted   Disease of spinal cord (HCC) 04/01/2020   Numbness 04/01/2020   Dysesthesia  03/29/2019   Gait disturbance 03/29/2019   Spinal cord infarction (HCC) 02/20/2019   Cauda equina syndrome (HCC) 02/13/2019   DM2 (diabetes mellitus, type 2) (HCC) 02/13/2019   Syncope 02/13/2019   Essential hypertension 02/13/2019   Class 3 severe obesity with serious comorbidity and body mass index (BMI) of 45.0 to 49.9 in adult (HCC) 10/04/2017   Class 3 severe obesity without serious comorbidity with body mass index (BMI) of 45.0 to 49.9 in adult (HCC) 04/12/2017   Vitamin D  deficiency 04/12/2017   Other fatigue 12/31/2016   SOB (shortness of breath) on exertion 12/31/2016   Type 2 diabetes mellitus without complication, without long-term current use of insulin  (HCC) 12/31/2016   Morbid obesity (HCC) 12/31/2016   OA (osteoarthritis) of hip 12/02/2015    PCP: Faustina Hood, MD  REFERRING PROVIDER: Zuleta, Kaitlin G, NP   REFERRING DIAG:  N39.41 (ICD-10-CM) - Urge incontinence  N32.81 (ICD-10-CM) - OAB (overactive bladder)  M62.89 (ICD-10-CM) - Pelvic floor dysfunction in female  N81.10 (ICD-10-CM) - Prolapse of anterior vaginal wall    THERAPY DIAG:  Other lack of coordination  Muscle weakness (generalized)  Pelvic floor dysfunction in female  Rationale for Evaluation and Treatment: Rehabilitation  ONSET DATE: 02/10/2019  SUBJECTIVE:  SUBJECTIVE STATEMENT: I am not sure if I did not do my exercise right.    PAIN:  Are you having pain? No  PRECAUTIONS: None  RED FLAGS: None   WEIGHT BEARING RESTRICTIONS: No  FALLS:  Has patient fallen in last 6 months? No  OCCUPATION: retired  ACTIVITY LEVEL : goes to the [employer] 3 days per week  PLOF: Independent  PATIENT GOALS: improve the leakage and gain control of the bladder to improve the quality of life  PERTINENT HISTORY:   Diabetes; PCOS; Hypertension; bil. Hip replacement; spinal stroke Sexual abuse: No  BOWEL MOVEMENT: Pain with bowel movement: No Type of bowel movement:Type (Bristol Stool Scale) Type 1 or Type 4, Frequency daily multiple times, Strain once in awhile, and Splinting none Fully empty rectum: No, will wipe the anus to help the stool comes out Leakage: Yes: happens as she is walking to the bathroom Pads: No, wear them for a rode trip Fiber supplement/laxative No  URINATION: Pain with urination: No Fully empty bladder: Yes: most of the time, sometimes she will have to urinate again before she leaves the bathroom Stream: Strong and Weak Urgency: Yes  Frequency: average, will not drink fluid when she has to go out Leakage: Urge to void and Walking to the bathroom Pads: Yes: only when she is going on a road trip  INTERCOURSE: not active   PREGNANCY: Vaginal deliveries 2 Tearing No   PROLAPSE: Bulge   OBJECTIVE:  Note: Objective measures were completed at Evaluation unless otherwise noted.  DIAGNOSTIC FINDINGS:  stage II/IV anterior vaginal prolapse with stage I/IV uterine prolapse   PATIENT SURVEYS:  CRAIQ-7: 38 UIQ-7: 38 PFIQ-7: 77  COGNITION: Overall cognitive status: Within functional limits for tasks assessed     SENSATION: Light touch: Deficits decreased sensation of the bottom of the left foot   GAIT: Assistive device utilized: None  POSTURE: No Significant postural limitations   LUMBARAROM/PROM:lumbar ROM is full   LOWER EXTREMITY ROM:  Passive ROM Right eval Left eval  Hip internal rotation 10 10   (Blank rows = not tested)  LOWER EXTREMITY MMT:  MMT Right eval Left eval  Hip flexion 4/5 4/5  Hip extension 4/5 4/5  Hip abduction 3-/5 3/5  Hip adduction 5/5 5/5   (Blank rows = not tested) PALPATION:   General: Patient has lymphedema in the left leg  Pelvic Alignment: ASIS are equal  Abdominal: some difficulty with contracting the lower  abdomen due to trying to lift the rib cage.                 External Perineal Exam: perineal area intact, tightness along the perineal body                             Internal Pelvic Floor: decreased sensation of the left side of the vulvar area  Patient confirms identification and approves PT to assess internal pelvic floor and treatment Yes  PELVIC MMT:   MMT eval  Vaginal 2/5 with tightness on the sides of the introitus  Internal Anal Sphincter 2/5  External Anal Sphincter 2/5  Puborectalis 2/5 and needed taping of the  muscles to come forward  Diastasis Recti none  (Blank rows = not tested)        TONE: average  PROLAPSE: Anterior wall weakness coming to the introitus when patient bears down  TODAY'S TREATMENT:  12/20/23 Neuromuscular re-education: Pelvic floor contraction training: Using the RUSI  with pelvic floor program with ultrasound head above the pubic bone Hold pelvic floor 10.7 sec for 6 mm Working on not pushing the pelvic floor downward instead lifting the pelvic floor upward and feeling the contraction Ball squeeze with pelvic floor contraction in supine Bridge with ball squeeze with pelvic floor contraction Supine with pressing ball into hip to engage the lower abdomen and pelvic floor.  Self-care: Educated patient on different supports on the prolapse like pessary, surgery, or external support.                                                                                                                                 DATE: 12/13/23  EVAL Examination completed, findings reviewed, pt educated on POC, HEP, and female pelvic floor anatomy, reasoning with pelvic floor assessment internally with pt consent, and abdominal massage. Pt motivated to participate in PT and agreeable to attempt recommendations.     PATIENT EDUCATION:  12/20/23 Education details: Access Code: [email or web address], educated patient several different options for treatment of pelvic floor  prolapse Person educated: Patient Education method: Demonstration, Actor cues, Verbal cues, and Handouts Education comprehension: verbalized understanding, returned demonstration, verbal cues required, tactile cues required, and needs further education  HOME EXERCISE PROGRAM: 12/20/23 Access Code: [email or web address] URL: [email or web address] Date: 12/20/2023 Prepared by: Marsha Skeen  Exercises - Supine Pelvic Floor Contraction  - 3 x daily - 7 x weekly - 10 reps - 10 sec hold - Supine Hip Adduction Isometric with Ball  - 1 x daily - 7 x weekly - 1 sets - 10 reps - Supine Bridge with Mini Swiss Ball Between Knees  - 1 x daily - 7 x weekly - 1 sets - 10 reps - Hooklying Isometric Hip Flexion  - 1 x daily - 7 x weekly - 3 sets - 10 reps  ASSESSMENT:  CLINICAL IMPRESSION: Patient is a 68 y.o. female who was seen today for physical therapy  treatment for fecal incontinence, urge incontinence, anterior wall prolapse, and pelvic floor dysfunction. Patient had a spinal stroke on  02/10/2019 and since then she had issues with urinary and fecal leakage and prolapse. Patient was able to move her pelvic floor 6 mm using the RUSI. She needed visual and verbal cues to not bear down and hold her breath with a pelvic floor contraction. She was able to feel the bladder lift by the end of the treatment for the first time. Patient will benefit from skilled therapy to improve pelvic floor strength and coordination to reduce her leakage.   OBJECTIVE IMPAIRMENTS: decreased coordination, decreased endurance, decreased strength, and increased fascial restrictions.   ACTIVITY LIMITATIONS: continence, toileting, and locomotion level  PARTICIPATION LIMITATIONS: driving, shopping, and community activity  PERSONAL FACTORS: Age, Time since onset of injury/illness/exacerbation, and 3+ comorbidities: Diabetes; PCOS; Hypertension; bil. Hip replacement; spinal stroke  are also affecting patient's functional outcome.    REHAB POTENTIAL: Good  CLINICAL DECISION MAKING: Unstable/unpredictable  EVALUATION COMPLEXITY: High   GOALS: Goals reviewed with patient? Yes  SHORT TERM GOALS: Target date: 01/10/24  Patient independent with initial HEP for pelvic floor contraction.  Baseline: Goal status: Met 12/20/23  2.  Patient is able to feel the pelvic floor contraction to understands how to strengthen.  Baseline:  Goal status: INITIAL  3.  Patient was educated in the urge to void.  Baseline:  Goal status: INITIAL   LONG TERM GOALS: Target date: 03/06/24  Patient independent with advanced HEP for core and pelvic floor strength.  Baseline:  Goal status: INITIAL  2.  Patient rectal strength increased >/= 3/5 with the puborectalis coming forward to reduce her fecal leakage.  Baseline:  Goal status: INITIAL  3.  Patient is able to have the urge to urinate and walk to the bathroom with minimal to no urinary leakage.  Baseline:  Goal status: INITIAL  4.  Patient is able to have the urge to defecate and walk to the bathroom with minimal to no fecal leakage.  Baseline:  Goal status: INITIAL  5.  PFIQ-7 score goes from  77 to </= 25 points due to feeling she is able to go out in the community without the fear of leakage.  Baseline:  Goal status: INITIAL   PLAN:  PT FREQUENCY: 1-2x/week  PT DURATION: 12 weeks  PLANNED INTERVENTIONS: 97110-Therapeutic exercises, 97530- Therapeutic activity, 97112- Neuromuscular re-education, 97535- Self Care, 16109- Manual therapy, G0283- Electrical stimulation (unattended), Patient/Family education, and Biofeedback  PLAN FOR NEXT SESSION: RUSI to show her how the pelvic floor moves with the anus, diaphragmatic breathing, pelvic floor strength exercises   Marsha Skeen, PT 12/20/23 11:00 AM

## 2023-12-27 ENCOUNTER — Ambulatory Visit: Admitting: Physical Therapy

## 2023-12-27 ENCOUNTER — Encounter: Payer: Self-pay | Admitting: Physical Therapy

## 2023-12-27 DIAGNOSIS — R278 Other lack of coordination: Secondary | ICD-10-CM

## 2023-12-27 DIAGNOSIS — M6289 Other specified disorders of muscle: Secondary | ICD-10-CM

## 2023-12-27 DIAGNOSIS — M6281 Muscle weakness (generalized): Secondary | ICD-10-CM | POA: Diagnosis not present

## 2023-12-27 NOTE — Patient Instructions (Signed)
 Urge Incontinence  Ideal urination frequency is every 2-4 wakeful hours, which equates to 5-8 times within a 24-hour period.   Urge incontinence is leakage that occurs when the bladder muscle contracts, creating a sudden need to go before getting to the bathroom.   Going too often when your bladder isn't actually full can disrupt the body's automatic signals to store and hold urine longer, which will increase urgency/frequency.  In this case, the bladder "is running the show" and strategies can be learned to retrain this pattern.   One should be able to control the first urge to urinate, at around .  The bladder can hold up to a "grande latte," or . To help you gain control, practice the Urge Drill below when urgency strikes.  This drill will help retrain your bladder signals and allow you to store and hold urine longer.  The overall goal is to stretch out your time between voids to reach a more manageable voiding schedule.    Practice your "quick flicks" often throughout the day (each waking hour) even when you don't need feel the urge to go.  This will help strengthen your pelvic floor muscles, making them more effective in controlling leakage.  Urge Drill  When you feel an urge to go, follow these steps to regain control: Stop what you are doing and be still Take one deep breath, directing your air into your abdomen Think an affirming thought, such as "I've got this." Do 5 quick flicks of your pelvic floor Hit heels on the ground hard 3 times.  Walk with control to the bathroom to void, or delay voiding Repeat as above if urge comes again  [facility] [address] [place], [place] [address] Phone # [phone] Fax [phone]

## 2023-12-27 NOTE — Therapy (Signed)
 OUTPATIENT PHYSICAL THERAPY FEMALE PELVIC TREATMENT   Patient Name: Laura Blackwell MRN: [id number] DOB:August 30, 1956, 68 y.o., female Today's Date: 12/27/2023  END OF SESSION:  PT End of Session - 12/27/23 1019     Visit Number 3    Date for PT Re-Evaluation 03/06/24    Authorization Type [employer] medicare    Authorization Time Period 12/13/2023 - 03/06/2024    Authorization - Visit Number 2    Authorization - Number of Visits 12    Progress Note Due on Visit 10    PT Start Time 1015    PT Stop Time 1055    PT Time Calculation (min) 40 min    Activity Tolerance Patient tolerated treatment well    Behavior During Therapy WFL for tasks assessed/performed             Past Medical History:  Diagnosis Date   Arthritis    Diabetes mellitus without complication (HCC)    Edema    in left leg in 1980s on left ankle    Hyperlipidemia    Hypertension    Neuropathy    PCOS (polycystic ovarian syndrome)    Spinal cord stroke (HCC)    01/2019   Superficial thrombophlebitis    Swelling    Venous insufficiency    Vitamin D  deficiency    Past Surgical History:  Procedure Laterality Date   BREAST BIOPSY Right    FOOT ARTHRODESIS Right 10/10/2020   Procedure: Right talonavicular and subtalar joint arthrodesis;  Surgeon: Amada Backer, MD;  Location: [facility];  Service: Orthopedics;  Laterality: Right;   GASTROC RECESSION EXTREMITY Right 10/10/2020   Procedure: Gastroc recession;  Surgeon: Amada Backer, MD;  Location: [facility];  Service: Orthopedics;  Laterality: Right;   JOINT REPLACEMENT  2011    right hip    left ankle surgery      TOTAL HIP ARTHROPLASTY Left 12/02/2015   Procedure: LEFT TOTAL HIP ARTHROPLASTY ANTERIOR APPROACH;  Surgeon: Liliane Rei, MD;  Location: [facility];  Service: Orthopedics;  Laterality: Left;   Patient Active Problem List   Diagnosis Date Noted   Disease of spinal cord (HCC) 04/01/2020   Numbness 04/01/2020   Dysesthesia  03/29/2019   Gait disturbance 03/29/2019   Spinal cord infarction (HCC) 02/20/2019   Cauda equina syndrome (HCC) 02/13/2019   DM2 (diabetes mellitus, type 2) (HCC) 02/13/2019   Syncope 02/13/2019   Essential hypertension 02/13/2019   Class 3 severe obesity with serious comorbidity and body mass index (BMI) of 45.0 to 49.9 in adult (HCC) 10/04/2017   Class 3 severe obesity without serious comorbidity with body mass index (BMI) of 45.0 to 49.9 in adult (HCC) 04/12/2017   Vitamin D  deficiency 04/12/2017   Other fatigue 12/31/2016   SOB (shortness of breath) on exertion 12/31/2016   Type 2 diabetes mellitus without complication, without long-term current use of insulin  (HCC) 12/31/2016   Morbid obesity (HCC) 12/31/2016   OA (osteoarthritis) of hip 12/02/2015    PCP: Faustina Hood, MD  REFERRING PROVIDER: Zuleta, Kaitlin G, NP   REFERRING DIAG:  N39.41 (ICD-10-CM) - Urge incontinence  N32.81 (ICD-10-CM) - OAB (overactive bladder)  M62.89 (ICD-10-CM) - Pelvic floor dysfunction in female  N81.10 (ICD-10-CM) - Prolapse of anterior vaginal wall    THERAPY DIAG:  Other lack of coordination  Muscle weakness (generalized)  Pelvic floor dysfunction in female  Rationale for Evaluation and Treatment: Rehabilitation  ONSET DATE: 02/10/2019  SUBJECTIVE:  SUBJECTIVE STATEMENT: I went to [employer] this past weekend. I had to stop 2 times to urinate for a 3 hour ride. No urine in the diaper after 3 hour drive and made it to the bathroom. When patient wipes the anus the rest of the urine will come out. No fecal leakage since last visit.    PAIN:  Are you having pain? No  PRECAUTIONS: None  RED FLAGS: None   WEIGHT BEARING RESTRICTIONS: No  FALLS:  Has patient fallen in last 6 months?  No  OCCUPATION: retired  ACTIVITY LEVEL : goes to the [employer] 3 days per week  PLOF: Independent  PATIENT GOALS: improve the leakage and gain control of the bladder to improve the quality of life  PERTINENT HISTORY:  Diabetes; PCOS; Hypertension; bil. Hip replacement; spinal stroke Sexual abuse: No  BOWEL MOVEMENT: Pain with bowel movement: No Type of bowel movement:Type (Bristol Stool Scale) Type 1 or Type 4, Frequency daily multiple times, Strain once in awhile, and Splinting none Fully empty rectum: No, will wipe the anus to help the stool comes out Leakage: Yes: happens as she is walking to the bathroom Pads: No, wear them for a rode trip Fiber supplement/laxative No  URINATION: Pain with urination: No Fully empty bladder: Yes: most of the time, sometimes she will have to urinate again before she leaves the bathroom Stream: Strong and Weak Urgency: Yes  Frequency: average, will not drink fluid when she has to go out Leakage: Urge to void and Walking to the bathroom Pads: Yes: only when she is going on a road trip  INTERCOURSE: not active   PREGNANCY: Vaginal deliveries 2 Tearing No   PROLAPSE: Bulge   OBJECTIVE:  Note: Objective measures were completed at Evaluation unless otherwise noted.  DIAGNOSTIC FINDINGS:  stage II/IV anterior vaginal prolapse with stage I/IV uterine prolapse   PATIENT SURVEYS:  CRAIQ-7: 38 UIQ-7: 38 PFIQ-7: 77  COGNITION: Overall cognitive status: Within functional limits for tasks assessed     SENSATION: Light touch: Deficits decreased sensation of the bottom of the left foot   GAIT: Assistive device utilized: None  POSTURE: No Significant postural limitations   LUMBARAROM/PROM:lumbar ROM is full   LOWER EXTREMITY ROM:  Passive ROM Right eval Left eval  Hip internal rotation 10 10   (Blank rows = not tested)  LOWER EXTREMITY MMT:  MMT Right eval Left eval  Hip flexion 4/5 4/5  Hip extension 4/5 4/5  Hip  abduction 3-/5 3/5  Hip adduction 5/5 5/5   (Blank rows = not tested) PALPATION:   General: Patient has lymphedema in the left leg  Pelvic Alignment: ASIS are equal  Abdominal: some difficulty with contracting the lower abdomen due to trying to lift the rib cage.                 External Perineal Exam: perineal area intact, tightness along the perineal body                             Internal Pelvic Floor: decreased sensation of the left side of the vulvar area  Patient confirms identification and approves PT to assess internal pelvic floor and treatment Yes  PELVIC MMT:   MMT eval  Vaginal 2/5 with tightness on the sides of the introitus  Internal Anal Sphincter 2/5  External Anal Sphincter 2/5  Puborectalis 2/5 and needed taping of the  muscles to come forward  Diastasis Recti none  (  Blank rows = not tested)        TONE: average  PROLAPSE: Anterior wall weakness coming to the introitus when patient bears down  TODAY'S TREATMENT:  12/27/23 Neuromuscular re-education: Pelvic floor contraction training: Bridge with ball squeeze with pelvic floor contraction Supine with pressing ball into hip to engage the lower abdomen and pelvic floor. Supine with ball squeeze bilateral shoulder extension with red band  Down training: Educated patient on how to use the urge to void, how to take her mind off the urge to urinate, how to do the quick flicks Exercises: Strengthening: Sitting hip abduction with red band 20 x Standing side step with red band around knees Sitting hip flexion with red band  Therapeutic activities: Functional strengthening activities: Going from sitting on mat then going to the floor to get on the ground safely and no urinary leakage  12/20/23 Neuromuscular re-education: Pelvic floor contraction training: Using the RUSI with pelvic floor program with ultrasound head above the pubic bone Hold pelvic floor 10.7 sec for 6 mm Working on not pushing the  pelvic floor downward instead lifting the pelvic floor upward and feeling the contraction Ball squeeze with pelvic floor contraction in supine Bridge with ball squeeze with pelvic floor contraction Supine with pressing ball into hip to engage the lower abdomen and pelvic floor.  Self-care: Educated patient on different supports on the prolapse like pessary, surgery, or external support.                                                                                                                                 DATE: 12/13/23  EVAL Examination completed, findings reviewed, pt educated on POC, HEP, and female pelvic floor anatomy, reasoning with pelvic floor assessment internally with pt consent, and abdominal massage. Pt motivated to participate in PT and agreeable to attempt recommendations.     PATIENT EDUCATION:  12/20/23 Education details: Access Code: [email or web address], educated patient several different options for treatment of pelvic floor prolapse Person educated: Patient Education method: Demonstration, Actor cues, Verbal cues, and Handouts Education comprehension: verbalized understanding, returned demonstration, verbal cues required, tactile cues required, and needs further education  HOME EXERCISE PROGRAM: 12/20/23 Access Code: [email or web address] URL: [email or web address] Date: 12/20/2023 Prepared by: Marsha Skeen  Exercises - Supine Pelvic Floor Contraction  - 3 x daily - 7 x weekly - 10 reps - 10 sec hold - Supine Hip Adduction Isometric with Ball  - 1 x daily - 7 x weekly - 1 sets - 10 reps - Supine Bridge with Mini Swiss Ball Between Knees  - 1 x daily - 7 x weekly - 1 sets - 10 reps - Hooklying Isometric Hip Flexion  - 1 x daily - 7 x weekly - 3 sets - 10 reps  ASSESSMENT:  CLINICAL IMPRESSION: Patient is a 68 y.o. female who was seen today for physical therapy  treatment for fecal incontinence, urge incontinence, anterior wall prolapse, and pelvic  floor dysfunction.  Patient had a spinal stroke on  02/10/2019 and since then she had issues with urinary and fecal leakage and prolapse. Patient is able to feel the pelvic floor contract now. She has not had fecal leakage since last visit. She will wipe her anus to fully empty her bladder. She is working on strengthening of the legs and pelvic floor now. She was educated on the urge to void.  Patient will benefit from skilled therapy to improve pelvic floor strength and coordination to reduce her leakage.   OBJECTIVE IMPAIRMENTS: decreased coordination, decreased endurance, decreased strength, and increased fascial restrictions.   ACTIVITY LIMITATIONS: continence, toileting, and locomotion level  PARTICIPATION LIMITATIONS: driving, shopping, and community activity  PERSONAL FACTORS: Age, Time since onset of injury/illness/exacerbation, and 3+ comorbidities: Diabetes; PCOS; Hypertension; bil. Hip replacement; spinal stroke  are also affecting patient's functional outcome.   REHAB POTENTIAL: Good  CLINICAL DECISION MAKING: Unstable/unpredictable  EVALUATION COMPLEXITY: High   GOALS: Goals reviewed with patient? Yes  SHORT TERM GOALS: Target date: 01/10/24  Patient independent with initial HEP for pelvic floor contraction.  Baseline: Goal status: Met 12/20/23  2.  Patient is able to feel the pelvic floor contraction to understands how to strengthen.  Baseline:  Goal status: Met 12/27/23  3.  Patient was educated in the urge to void.  Baseline:  Goal status: Met 12/27/23   LONG TERM GOALS: Target date: 03/06/24  Patient independent with advanced HEP for core and pelvic floor strength.  Baseline:  Goal status: INITIAL  2.  Patient rectal strength increased >/= 3/5 with the puborectalis coming forward to reduce her fecal leakage.  Baseline:  Goal status: INITIAL  3.  Patient is able to have the urge to urinate and walk to the bathroom with minimal to no urinary leakage.  Baseline:  Goal status:  INITIAL  4.  Patient is able to have the urge to defecate and walk to the bathroom with minimal to no fecal leakage.  Baseline:  Goal status: INITIAL  5.  PFIQ-7 score goes from  77 to </= 25 points due to feeling she is able to go out in the community without the fear of leakage.  Baseline:  Goal status: INITIAL   PLAN:  PT FREQUENCY: 1-2x/week  PT DURATION: 12 weeks  PLANNED INTERVENTIONS: 97110-Therapeutic exercises, 97530- Therapeutic activity, 97112- Neuromuscular re-education, 97535- Self Care, [id number]- Manual therapy, [id number]- Electrical stimulation (unattended), Patient/Family education, and Biofeedback  PLAN FOR NEXT SESSION: RUSI to show her how the pelvic floor moves with the anus, diaphragmatic breathing, pelvic floor strength exercises   Marsha Skeen, PT 12/27/23 11:01 AM

## 2024-02-04 ENCOUNTER — Encounter: Payer: Self-pay | Admitting: Physical Therapy

## 2024-02-04 ENCOUNTER — Ambulatory Visit: Attending: Obstetrics and Gynecology | Admitting: Physical Therapy

## 2024-02-04 DIAGNOSIS — R278 Other lack of coordination: Secondary | ICD-10-CM | POA: Insufficient documentation

## 2024-02-04 DIAGNOSIS — M6289 Other specified disorders of muscle: Secondary | ICD-10-CM | POA: Insufficient documentation

## 2024-02-04 DIAGNOSIS — M6281 Muscle weakness (generalized): Secondary | ICD-10-CM | POA: Insufficient documentation

## 2024-02-04 NOTE — Therapy (Signed)
 OUTPATIENT PHYSICAL THERAPY FEMALE PELVIC TREATMENT   Patient Name: Laura Blackwell MRN: [id number] DOB:12-Aug-1956, 68 y.o., female Today's Date: 02/04/2024  END OF SESSION:  PT End of Session - 02/04/24 1059     Visit Number 4    Date for PT Re-Evaluation 03/06/24    Authorization Type [employer] medicare    Authorization Time Period 12/13/2023 - 03/06/2024    Authorization - Visit Number 3    Authorization - Number of Visits 12    Progress Note Due on Visit 10    PT Start Time 1100    PT Stop Time 1140    PT Time Calculation (min) 40 min    Activity Tolerance Patient tolerated treatment well    Behavior During Therapy WFL for tasks assessed/performed             Past Medical History:  Diagnosis Date   Arthritis    Diabetes mellitus without complication (HCC)    Edema    in left leg in 1980s on left ankle    Hyperlipidemia    Hypertension    Neuropathy    PCOS (polycystic ovarian syndrome)    Spinal cord stroke (HCC)    01/2019   Superficial thrombophlebitis    Swelling    Venous insufficiency    Vitamin D  deficiency    Past Surgical History:  Procedure Laterality Date   BREAST BIOPSY Right    FOOT ARTHRODESIS Right 10/10/2020   Procedure: Right talonavicular and subtalar joint arthrodesis;  Surgeon: Amada Backer, MD;  Location: [facility];  Service: Orthopedics;  Laterality: Right;   GASTROC RECESSION EXTREMITY Right 10/10/2020   Procedure: Gastroc recession;  Surgeon: Amada Backer, MD;  Location: [facility];  Service: Orthopedics;  Laterality: Right;   JOINT REPLACEMENT  2011    right hip    left ankle surgery      TOTAL HIP ARTHROPLASTY Left 12/02/2015   Procedure: LEFT TOTAL HIP ARTHROPLASTY ANTERIOR APPROACH;  Surgeon: Liliane Rei, MD;  Location: [facility];  Service: Orthopedics;  Laterality: Left;   Patient Active Problem List   Diagnosis Date Noted   Disease of spinal cord (HCC) 04/01/2020   Numbness 04/01/2020   Dysesthesia  03/29/2019   Gait disturbance 03/29/2019   Spinal cord infarction (HCC) 02/20/2019   Cauda equina syndrome (HCC) 02/13/2019   DM2 (diabetes mellitus, type 2) (HCC) 02/13/2019   Syncope 02/13/2019   Essential hypertension 02/13/2019   Class 3 severe obesity with serious comorbidity and body mass index (BMI) of 45.0 to 49.9 in adult 10/04/2017   Class 3 severe obesity without serious comorbidity with body mass index (BMI) of 45.0 to 49.9 in adult 04/12/2017   Vitamin D  deficiency 04/12/2017   Other fatigue 12/31/2016   SOB (shortness of breath) on exertion 12/31/2016   Type 2 diabetes mellitus without complication, without long-term current use of insulin  (HCC) 12/31/2016   Morbid obesity (HCC) 12/31/2016   OA (osteoarthritis) of hip 12/02/2015    PCP: Faustina Hood, MD  REFERRING PROVIDER: Zuleta, Kaitlin G, NP   REFERRING DIAG:  N39.41 (ICD-10-CM) - Urge incontinence  N32.81 (ICD-10-CM) - OAB (overactive bladder)  M62.89 (ICD-10-CM) - Pelvic floor dysfunction in female  N81.10 (ICD-10-CM) - Prolapse of anterior vaginal wall    THERAPY DIAG:  Other lack of coordination  Muscle weakness (generalized)  Pelvic floor dysfunction in female  Rationale for Evaluation and Treatment: Rehabilitation  ONSET DATE: 02/10/2019  SUBJECTIVE:  SUBJECTIVE STATEMENT: I am having more control.    PAIN:  Are you having pain? No  PRECAUTIONS: None  RED FLAGS: None   WEIGHT BEARING RESTRICTIONS: No  FALLS:  Has patient fallen in last 6 months? No  OCCUPATION: retired  ACTIVITY LEVEL : goes to the [employer] 3 days per week  PLOF: Independent  PATIENT GOALS: improve the leakage and gain control of the bladder to improve the quality of life  PERTINENT HISTORY:  Diabetes; PCOS; Hypertension; bil. Hip  replacement; spinal stroke Sexual abuse: No  BOWEL MOVEMENT: Pain with bowel movement: No Type of bowel movement:Type (Bristol Stool Scale) Type 1 or Type 4, Frequency daily multiple times, Strain once in awhile, and Splinting none Fully empty rectum: No, will wipe the anus to help the stool comes out Leakage: Yes: happens as she is walking to the bathroom Pads: No, wear them for a rode trip Fiber supplement/laxative No  URINATION: Pain with urination: No Fully empty bladder: Yes: most of the time, sometimes she will have to urinate again before she leaves the bathroom Stream: Strong and Weak Urgency: Yes  Frequency: average, will not drink fluid when she has to go out Leakage: Urge to void and Walking to the bathroom Pads: Yes: only when she is going on a road trip  INTERCOURSE: not active   PREGNANCY: Vaginal deliveries 2 Tearing No   PROLAPSE: Bulge   OBJECTIVE:  Note: Objective measures were completed at Evaluation unless otherwise noted.  DIAGNOSTIC FINDINGS:  stage II/IV anterior vaginal prolapse with stage I/IV uterine prolapse   PATIENT SURVEYS:  CRAIQ-7: 38 UIQ-7: 38 PFIQ-7: 77  COGNITION: Overall cognitive status: Within functional limits for tasks assessed     SENSATION: Light touch: Deficits decreased sensation of the bottom of the left foot   GAIT: Assistive device utilized: None  POSTURE: No Significant postural limitations   LUMBARAROM/PROM:lumbar ROM is full   LOWER EXTREMITY ROM:  Passive ROM Right eval Left eval  Hip internal rotation 10 10   (Blank rows = not tested)  LOWER EXTREMITY MMT:  MMT Right eval Left eval  Hip flexion 4/5 4/5  Hip extension 4/5 4/5  Hip abduction 3-/5 3/5  Hip adduction 5/5 5/5   (Blank rows = not tested) PALPATION:   General: Patient has lymphedema in the left leg  Pelvic Alignment: ASIS are equal  Abdominal: some difficulty with contracting the lower abdomen due to trying to lift the rib  cage.                 External Perineal Exam: perineal area intact, tightness along the perineal body                             Internal Pelvic Floor: decreased sensation of the left side of the vulvar area  Patient confirms identification and approves PT to assess internal pelvic floor and treatment Yes  PELVIC MMT:   MMT eval  Vaginal 2/5 with tightness on the sides of the introitus  Internal Anal Sphincter 2/5  External Anal Sphincter 2/5  Puborectalis 2/5 and needed taping of the  muscles to come forward  Diastasis Recti none  (Blank rows = not tested)        TONE: average  PROLAPSE: Anterior wall weakness coming to the introitus when patient bears down  TODAY'S TREATMENT:  02/04/24 Manual: Soft tissue mobilization: Circular massage to the abdomen to work on peristalic motion of the  intestines and educated patient on how to perform at home Manual work to the diaphragm to elongate NExercises: Strengthening: Bridge with ball squeeze with pelvic floor contraction  with VC to press heels into mat Dead bug 20 x with verbal cues to contract the abdominals Straight leg raise with abdominal contraction 10 x each and breath out as she lifts her leg Hip adduction 2 x 10 laying on her side  12/27/23 Neuromuscular re-education: Pelvic floor contraction training: Bridge with ball squeeze with pelvic floor contraction Supine with pressing ball into hip to engage the lower abdomen and pelvic floor. Supine with ball squeeze bilateral shoulder extension with red band  Down training: Educated patient on how to use the urge to void, how to take her mind off the urge to urinate, how to do the quick flicks Exercises: Strengthening: Sitting hip abduction with red band 20 x Standing side step with red band around knees Sitting hip flexion with red band  Therapeutic activities: Functional strengthening activities: Going from sitting on mat then going to the floor to get on the ground  safely and no urinary leakage  12/20/23 Neuromuscular re-education: Pelvic floor contraction training: Using the RUSI with pelvic floor program with ultrasound head above the pubic bone Hold pelvic floor 10.7 sec for 6 mm Working on not pushing the pelvic floor downward instead lifting the pelvic floor upward and feeling the contraction Ball squeeze with pelvic floor contraction in supine Bridge with ball squeeze with pelvic floor contraction Supine with pressing ball into hip to engage the lower abdomen and pelvic floor.  Self-care: Educated patient on different supports on the prolapse like pessary, surgery, or external support.                                                                                                                                 DATE: 12/13/23  EVAL Examination completed, findings reviewed, pt educated on POC, HEP, and female pelvic floor anatomy, reasoning with pelvic floor assessment internally with pt consent, and abdominal massage. Pt motivated to participate in PT and agreeable to attempt recommendations.     PATIENT EDUCATION:  02/04/24 Education details: Access Code: [email or web address], educated patient several different options for treatment of pelvic floor prolapse Person educated: Patient Education method: Demonstration, Actor cues, Verbal cues, and Handouts Education comprehension: verbalized understanding, returned demonstration, verbal cues required, tactile cues required, and needs further education  HOME EXERCISE PROGRAM: 02/04/24 Access Code: [email or web address] URL: [email or web address] Date: 02/04/2024 Prepared by: Marsha Skeen  Exercises - Supine Pelvic Floor Contraction  - 3 x daily - 7 x weekly - 10 reps - 10 sec hold - Supine Bridge with Mini Swiss Ball Between Knees  - 1 x daily - 7 x weekly - 1 sets - 10 reps - Seated March  - 1 x daily - 3 x weekly - 3 sets - 10 reps - Side Stepping with Resistance at Thighs  - 1 x daily -  3 x weekly - 1  sets - 10 reps - Dead Bug  - 1 x daily - 3 x weekly - 2 sets - 10 reps - Supine Pelvic Tilt with Straight Leg Raise  - 1 x daily - 3 x weekly - 2 sets - 10 reps - Sidelying Hip Adduction  - 1 x daily - 3 x weekly - 2 sets - 10 reps  Patient Education - Abdominal Massage for Constipation  ASSESSMENT:  CLINICAL IMPRESSION: Patient is a 68 y.o. female who was seen today for physical therapy  treatment for fecal incontinence, urge incontinence, anterior wall prolapse, and pelvic floor dysfunction. Patient had a spinal stroke on  02/10/2019 and since then she had issues with urinary and fecal leakage and prolapse. Fecal leakage is 60% . Patient has had 2 fecal leakage 2 times. I will urinate more when I urinate. Urinary leakage is 75% better. It is not every hour instead 4 times during the day.  Patient reports that my bladder is hanging out 50% of the time. Patient is not wearing her urinary leakage underwear as often.  Patient is able to wait 2 hours to urinate. Patient will benefit from skilled therapy to improve pelvic floor strength and coordination to reduce her leakage.   OBJECTIVE IMPAIRMENTS: decreased coordination, decreased endurance, decreased strength, and increased fascial restrictions.   ACTIVITY LIMITATIONS: continence, toileting, and locomotion level  PARTICIPATION LIMITATIONS: driving, shopping, and community activity  PERSONAL FACTORS: Age, Time since onset of injury/illness/exacerbation, and 3+ comorbidities: Diabetes; PCOS; Hypertension; bil. Hip replacement; spinal stroke are also affecting patient's functional outcome.   REHAB POTENTIAL: Good  CLINICAL DECISION MAKING: Unstable/unpredictable  EVALUATION COMPLEXITY: High   GOALS: Goals reviewed with patient? Yes  SHORT TERM GOALS: Target date: 01/10/24  Patient independent with initial HEP for pelvic floor contraction.  Baseline: Goal status: Met 12/20/23  2.  Patient is able to feel the pelvic floor contraction to  understands how to strengthen.  Baseline:  Goal status: Met 12/27/23  3.  Patient was educated in the urge to void.  Baseline:  Goal status: Met 12/27/23   LONG TERM GOALS: Target date: 03/06/24  Patient independent with advanced HEP for core and pelvic floor strength.  Baseline:  Goal status: INITIAL  2.  Patient rectal strength increased >/= 3/5 with the puborectalis coming forward to reduce her fecal leakage.  Baseline:  Goal status: INITIAL  3.  Patient is able to have the urge to urinate and walk to the bathroom with minimal to no urinary leakage.  Baseline:  Goal status: INITIAL  4.  Patient is able to have the urge to defecate and walk to the bathroom with minimal to no fecal leakage.  Baseline:  Goal status: INITIAL  5.  PFIQ-7 score goes from  77 to </= 25 points due to feeling she is able to go out in the community without the fear of leakage.  Baseline:  Goal status: INITIAL   PLAN:  PT FREQUENCY: 1-2x/week  PT DURATION: 12 weeks  PLANNED INTERVENTIONS: 97110-Therapeutic exercises, 97530- Therapeutic activity, 97112- Neuromuscular re-education, 97535- Self Care, [id number]- Manual therapy, [id number]- Electrical stimulation (unattended), Patient/Family education, and Biofeedback  PLAN FOR NEXT SESSION:  diaphragmatic breathing, pelvic floor strength exercises; see what MD says   Marsha Skeen, PT 02/04/24 11:41 AM

## 2024-02-09 ENCOUNTER — Encounter: Payer: Self-pay | Admitting: Physical Therapy

## 2024-02-09 ENCOUNTER — Ambulatory Visit: Admitting: Physical Therapy

## 2024-02-09 DIAGNOSIS — R278 Other lack of coordination: Secondary | ICD-10-CM

## 2024-02-09 DIAGNOSIS — M6281 Muscle weakness (generalized): Secondary | ICD-10-CM

## 2024-02-09 DIAGNOSIS — M6289 Other specified disorders of muscle: Secondary | ICD-10-CM

## 2024-02-09 NOTE — Therapy (Signed)
 OUTPATIENT PHYSICAL THERAPY FEMALE PELVIC TREATMENT   Patient Name: Laura Blackwell MRN: [id number] DOB:[birth year], 68 y.o., female Today's Date: 02/09/2024  END OF SESSION:  PT End of Session - 02/09/24 0927     Visit Number 5    Date for PT Re-Evaluation 03/06/24    Authorization Type [employer] medicare    Authorization Time Period 12/13/2023 - 03/06/2024    Authorization - Visit Number 4    Authorization - Number of Visits 12    Progress Note Due on Visit 10    PT Start Time 0930    PT Stop Time 1010    PT Time Calculation (min) 40 min    Activity Tolerance Patient tolerated treatment well    Behavior During Therapy WFL for tasks assessed/performed             Past Medical History:  Diagnosis Date   Arthritis    Diabetes mellitus without complication (HCC)    Edema    in left leg in 1980s on left ankle    Hyperlipidemia    Hypertension    Neuropathy    PCOS (polycystic ovarian syndrome)    Spinal cord stroke (HCC)    01/2019   Superficial thrombophlebitis    Swelling    Venous insufficiency    Vitamin D  deficiency    Past Surgical History:  Procedure Laterality Date   BREAST BIOPSY Right    FOOT ARTHRODESIS Right 10/10/2020   Procedure: Right talonavicular and subtalar joint arthrodesis;  Surgeon: Amada Backer, MD;  Location: [facility];  Service: Orthopedics;  Laterality: Right;   GASTROC RECESSION EXTREMITY Right 10/10/2020   Procedure: Gastroc recession;  Surgeon: Amada Backer, MD;  Location:  SURGERY CENTER;  Service: Orthopedics;  Laterality: Right;   JOINT REPLACEMENT  2011    right hip    left ankle surgery      TOTAL HIP ARTHROPLASTY Left 12/02/2015   Procedure: LEFT TOTAL HIP ARTHROPLASTY ANTERIOR APPROACH;  Surgeon: Liliane Rei, MD;  Location: [facility];  Service: Orthopedics;  Laterality: Left;   Patient Active Problem List   Diagnosis Date Noted   Disease of spinal cord (HCC) 04/01/2020   Numbness 04/01/2020   Dysesthesia  03/29/2019   Gait disturbance 03/29/2019   Spinal cord infarction (HCC) 02/20/2019   Cauda equina syndrome (HCC) 02/13/2019   DM2 (diabetes mellitus, type 2) (HCC) 02/13/2019   Syncope 02/13/2019   Essential hypertension 02/13/2019   Class 3 severe obesity with serious comorbidity and body mass index (BMI) of 45.0 to 49.9 in adult 10/04/2017   Class 3 severe obesity without serious comorbidity with body mass index (BMI) of 45.0 to 49.9 in adult 04/12/2017   Vitamin D  deficiency 04/12/2017   Other fatigue 12/31/2016   SOB (shortness of breath) on exertion 12/31/2016   Type 2 diabetes mellitus without complication, without long-term current use of insulin  (HCC) 12/31/2016   Morbid obesity (HCC) 12/31/2016   OA (osteoarthritis) of hip 12/02/2015    PCP: Faustina Hood, MD  REFERRING PROVIDER: Zuleta, Kaitlin G, NP   REFERRING DIAG:  N39.41 (ICD-10-CM) - Urge incontinence  N32.81 (ICD-10-CM) - OAB (overactive bladder)  M62.89 (ICD-10-CM) - Pelvic floor dysfunction in female  N81.10 (ICD-10-CM) - Prolapse of anterior vaginal wall    THERAPY DIAG:  Other lack of coordination  Muscle weakness (generalized)  Pelvic floor dysfunction in female  Rationale for Evaluation and Treatment: Rehabilitation  ONSET DATE: 02/10/2019  SUBJECTIVE:  SUBJECTIVE STATEMENT: I felt good after last visit and I felt I could do more. I got up 2 times last night at 2:30 AM and 7:00 compared 4-6 times. I see the doctor tomorrow.    PAIN:  Are you having pain? No  PRECAUTIONS: None  RED FLAGS: None   WEIGHT BEARING RESTRICTIONS: No  FALLS:  Has patient fallen in last 6 months? No  OCCUPATION: retired  ACTIVITY LEVEL : goes to the [employer] 3 days per week  PLOF: Independent  PATIENT GOALS: improve the leakage  and gain control of the bladder to improve the quality of life  PERTINENT HISTORY:  Diabetes; PCOS; Hypertension; bil. Hip replacement; spinal stroke Sexual abuse: No  BOWEL MOVEMENT: Pain with bowel movement: No Type of bowel movement:Type (Bristol Stool Scale) Type 1 or Type 4, Frequency daily multiple times, Strain once in awhile, and Splinting none Fully empty rectum: No, will wipe the anus to help the stool comes out Leakage: Yes: happens as she is walking to the bathroom Pads: No, wear them for a rode trip Fiber supplement/laxative No  URINATION: Pain with urination: No Fully empty bladder: Yes: most of the time, sometimes she will have to urinate again before she leaves the bathroom Stream: Strong and Weak Urgency: Yes  Frequency: average, will not drink fluid when she has to go out Leakage: Urge to void and Walking to the bathroom Pads: Yes: only when she is going on a road trip  INTERCOURSE: not active   PREGNANCY: Vaginal deliveries 2 Tearing No   PROLAPSE: Bulge   OBJECTIVE:  Note: Objective measures were completed at Evaluation unless otherwise noted.  DIAGNOSTIC FINDINGS:  stage II/IV anterior vaginal prolapse with stage I/IV uterine prolapse   PATIENT SURVEYS:  CRAIQ-7: 38 UIQ-7: 38 PFIQ-7: 77  COGNITION: Overall cognitive status: Within functional limits for tasks assessed     SENSATION: Light touch: Deficits decreased sensation of the bottom of the left foot   GAIT: Assistive device utilized: None  POSTURE: No Significant postural limitations   LUMBARAROM/PROM:lumbar ROM is full   LOWER EXTREMITY ROM:  Passive ROM Right eval Left eval  Hip internal rotation 10 10   (Blank rows = not tested)  LOWER EXTREMITY MMT:  MMT Right eval Left eval  Hip flexion 4/5 4/5  Hip extension 4/5 4/5  Hip abduction 3-/5 3/5  Hip adduction 5/5 5/5   (Blank rows = not tested) PALPATION:   General: Patient has lymphedema in the left  leg  Pelvic Alignment: ASIS are equal  Abdominal: some difficulty with contracting the lower abdomen due to trying to lift the rib cage.                 External Perineal Exam: perineal area intact, tightness along the perineal body                             Internal Pelvic Floor: decreased sensation of the left side of the vulvar area  Patient confirms identification and approves PT to assess internal pelvic floor and treatment Yes  PELVIC MMT:   MMT eval 02/09/24  Vaginal 2/5 with tightness on the sides of the introitus   Internal Anal Sphincter 2/5 4/5  External Anal Sphincter 2/5 4/5  Puborectalis 2/5 and needed taping of the  muscles to come forward 4/5  Diastasis Recti none   (Blank rows = not tested)        TONE: average  PROLAPSE: Anterior wall weakness coming to the introitus when patient bears down  TODAY'S TREATMENT:  02/09/24 Manual: Internal pelvic floor techniques: No emotional/communication barriers or cognitive limitation. Patient is motivated to learn. Patient understands and agrees with treatment goals and plan. PT explains patient will be examined in standing, sitting, and lying down to see how their muscles and joints work. When they are ready, they will be asked to remove their underwear so PT can examine their perineum. The patient is also given the option of providing their own chaperone as one is not provided in our facility. The patient also has the right and is explained the right to defer or refuse any part of the evaluation or treatment including the internal exam. With the patient's consent, PT will use one gloved finger to gently assess the muscles of the pelvic floor, seeing how well it contracts and relaxes and if there is muscle symmetry. After, the patient will get dressed and PT and patient will discuss exam findings and plan of care. PT and patient discuss plan of care, schedule, attendance policy and HEP activities.  Therapist gloved finger in  the rectum performing manual work to the puborectalis and superior transverse perineum to lengthen for contraction Neuromuscular re-education: Pelvic floor contraction training: Therapist gloved finger in the rectum working on quick contractions: 10 x in 13 sec, 13.31 sec, 10.71 sec, 10.95 sec,9.53 Therapist gloved finger in the rectum working on endurance: Hold 33 sec,27 sec, 41.45 sec Exercises: Strengthening: Bridge with ball squeeze with pelvic floor contraction  with VC to press heels into mat Supine with pressing ball into hip to engage the lower abdomen and pelvic floor. Yellow band for hip adduction  and holding 1 # 15 x each  Sit to stand holding 10 # with pelvic floor contraction    02/04/24 Manual: Soft tissue mobilization: Circular massage to the abdomen to work on peristalic motion of the intestines and educated patient on how to perform at home Manual work to the diaphragm to elongate NExercises: Strengthening: Bridge with ball squeeze with pelvic floor contraction  with VC to press heels into mat Dead bug 20 x with verbal cues to contract the abdominals Straight leg raise with abdominal contraction 10 x each and breath out as she lifts her leg Hip adduction 2 x 10 laying on her side  12/27/23 Neuromuscular re-education: Pelvic floor contraction training: Bridge with ball squeeze with pelvic floor contraction Supine with pressing ball into hip to engage the lower abdomen and pelvic floor. Supine with ball squeeze bilateral shoulder extension with red band  Down training: Educated patient on how to use the urge to void, how to take her mind off the urge to urinate, how to do the quick flicks Exercises: Strengthening: Sitting hip abduction with red band 20 x Standing side step with red band around knees Sitting hip flexion with red band  Therapeutic activities: Functional strengthening activities: Going from sitting on mat then going to the floor to get on the ground  safely and no urinary leakage  12/20/23 Neuromuscular re-education: Pelvic floor contraction training: Using the RUSI with pelvic floor program with ultrasound head above the pubic bone Hold pelvic floor 10.7 sec for 6 mm Working on not pushing the pelvic floor downward instead lifting the pelvic floor upward and feeling the contraction Ball squeeze with pelvic floor contraction in supine Bridge with ball squeeze with pelvic floor contraction Supine with pressing ball into hip to engage the lower abdomen and pelvic floor.  Self-care: Educated patient on different supports on the prolapse like pessary, surgery, or external support.                                                                                                                                 DATE: 12/13/23  EVAL Examination completed, findings reviewed, pt educated on POC, HEP, and female pelvic floor anatomy, reasoning with pelvic floor assessment internally with pt consent, and abdominal massage. Pt motivated to participate in PT and agreeable to attempt recommendations.     PATIENT EDUCATION:  02/04/24 Education details: Access Code: [email or web address], educated patient several different options for treatment of pelvic floor prolapse Person educated: Patient Education method: Demonstration, Actor cues, Verbal cues, and Handouts Education comprehension: verbalized understanding, returned demonstration, verbal cues required, tactile cues required, and needs further education  HOME EXERCISE PROGRAM: 02/04/24 Access Code: [email or web address] URL: [email or web address] Date: 02/04/2024 Prepared by: Marsha Skeen  Exercises - Supine Pelvic Floor Contraction  - 3 x daily - 7 x weekly - 10 reps - 10 sec hold - Supine Bridge with Mini Swiss Ball Between Knees  - 1 x daily - 7 x weekly - 1 sets - 10 reps - Seated March  - 1 x daily - 3 x weekly - 3 sets - 10 reps - Side Stepping with Resistance at Thighs  - 1 x daily - 3 x weekly - 1  sets - 10 reps - Dead Bug  - 1 x daily - 3 x weekly - 2 sets - 10 reps - Supine Pelvic Tilt with Straight Leg Raise  - 1 x daily - 3 x weekly - 2 sets - 10 reps - Sidelying Hip Adduction  - 1 x daily - 3 x weekly - 2 sets - 10 reps  Patient Education - Abdominal Massage for Constipation  ASSESSMENT:  CLINICAL IMPRESSION: Patient is a 68 y.o. female who was seen today for physical therapy  treatment for fecal incontinence, urge incontinence, anterior wall prolapse, and pelvic floor dysfunction. Patient had a spinal stroke on  02/10/2019 and since then she had issues with urinary and fecal leakage and prolapse.  Patient has  fecal leakage when she takes a laxative. Patient woke up 2 times to urinate instead of  6 times. Bladder does not stick out as much.  Urinary leakage is when walking to the bathroom is 75% better. Patient has difficulty with feeling the anal contraction but able to walk to the bathroom without fecal leakage. Rectal strength increased to 4/5 and she is not able to feel the contraction after the manual work.  Patient will benefit from skilled therapy to improve pelvic floor strength and coordination to reduce her leakage.   OBJECTIVE IMPAIRMENTS: decreased coordination, decreased endurance, decreased strength, and increased fascial restrictions.   ACTIVITY LIMITATIONS: continence, toileting, and locomotion level  PARTICIPATION LIMITATIONS: driving, shopping, and community activity  PERSONAL FACTORS: Age, Time since onset of injury/illness/exacerbation, and 3+ comorbidities:  Diabetes; PCOS; Hypertension; bil. Hip replacement; spinal stroke are also affecting patient's functional outcome.   REHAB POTENTIAL: Good  CLINICAL DECISION MAKING: Unstable/unpredictable  EVALUATION COMPLEXITY: High   GOALS: Goals reviewed with patient? Yes  SHORT TERM GOALS: Target date: 01/10/24  Patient independent with initial HEP for pelvic floor contraction.  Baseline: Goal status: Met  12/20/23  2.  Patient is able to feel the pelvic floor contraction to understands how to strengthen.  Baseline:  Goal status: Met 12/27/23  3.  Patient was educated in the urge to void.  Baseline:  Goal status: Met 12/27/23   LONG TERM GOALS: Target date: 03/06/24  Patient independent with advanced HEP for core and pelvic floor strength.  Baseline:  Goal status: INITIAL  2.  Patient rectal strength increased >/= 3/5 with the puborectalis coming forward to reduce her fecal leakage.  Baseline:  Goal status: ongoing 02/09/24  3.  Patient is able to have the urge to urinate and walk to the bathroom with minimal to no urinary leakage.  Baseline: improved by 75%.  Goal status: ongoing 02/09/24  4.  Patient is able to have the urge to defecate and walk to the bathroom with minimal to no fecal leakage.  Baseline: She has difficulty with feeling the contraction of the anus Goal status: ongoing 02/09/24  5.  PFIQ-7 score goes from  77 to </= 25 points due to feeling she is able to go out in the community without the fear of leakage.  Baseline:  Goal status: INITIAL   PLAN:  PT FREQUENCY: 1-2x/week  PT DURATION: 12 weeks  PLANNED INTERVENTIONS: 97110-Therapeutic exercises, 97530- Therapeutic activity, 97112- Neuromuscular re-education, 97535- Self Care, 16109- Manual therapy, [id number]- Electrical stimulation (unattended), Patient/Family education, and Biofeedback  PLAN FOR NEXT SESSION:  work on vaginal introitus,  pelvic floor strength exercises; see what MD says   Marsha Skeen, PT 02/09/24 9:31 AM

## 2024-02-10 ENCOUNTER — Encounter: Payer: Self-pay | Admitting: Obstetrics and Gynecology

## 2024-02-10 ENCOUNTER — Ambulatory Visit (INDEPENDENT_AMBULATORY_CARE_PROVIDER_SITE_OTHER): Admitting: Obstetrics and Gynecology

## 2024-02-10 VITALS — BP 131/80 | HR 91

## 2024-02-10 DIAGNOSIS — N3281 Overactive bladder: Secondary | ICD-10-CM

## 2024-02-10 DIAGNOSIS — M6289 Other specified disorders of muscle: Secondary | ICD-10-CM | POA: Diagnosis not present

## 2024-02-10 DIAGNOSIS — N3941 Urge incontinence: Secondary | ICD-10-CM

## 2024-02-10 DIAGNOSIS — N8110 Cystocele, unspecified: Secondary | ICD-10-CM

## 2024-02-10 NOTE — Progress Notes (Signed)
 [facility] Return Visit  SUBJECTIVE  History of Present Illness: Laura Blackwell is a 68 y.o. female seen in follow-up for OAB and FI. Plan at last visit was do physical therapy. She has been seeing Marsha Skeen and reports she is really feeling a difference.   Patient reports she has done so well with PT that she has stopped taking the Gemtesa  75mg  daily and is doing okay without medication.   Patient states she wants me to check where her bladder is in exam today.   Past Medical History: Patient  has a past medical history of Arthritis, Diabetes mellitus without complication (HCC), Edema, Hyperlipidemia, Hypertension, Neuropathy, PCOS (polycystic ovarian syndrome), Spinal cord stroke (HCC), Superficial thrombophlebitis, Swelling, Venous insufficiency, and Vitamin D  deficiency.   Past Surgical History: She  has a past surgical history that includes left ankle surgery ; Joint replacement (2011 ); Total hip arthroplasty (Left, 12/02/2015); Breast biopsy (Right); Gastroc recession extremity (Right, 10/10/2020); and Foot arthrodesis (Right, 10/10/2020).   Medications: She has a current medication list which includes the following prescription(s): atorvastatin , furosemide , gabapentin , glucose blood, hyoscyamine , insulin  pen needle, metformin , mounjaro, telmisartan, and vibegron .   Allergies: Patient is allergic to jardiance [empagliflozin].   Social History: Patient  reports that she quit smoking about 21 years ago. Her smoking use included cigarettes. She has never used smokeless tobacco. She reports that she does not drink alcohol and does not use drugs.     OBJECTIVE     Physical Exam: Vitals:   02/10/24 0844  BP: 131/80  Pulse: 91   Gen: No apparent distress, A&O x 3.  Detailed Urogynecologic Evaluation:   POP-Q  -1.5                                            Aa   -1.5                                           Ba  -3                                               C   3.5                                            Gh  3                                            Pb  9                                            tvl   -2  Ap  -2                                            Bp  -7                                              D   Patient's Pop-Q exam is roughly the same, with the only change being the cervix dropping just a little more.    ASSESSMENT AND PLAN    Ms. Tullo is a 68 y.o. with:  1. Urge incontinence   2. OAB (overactive bladder)   3. Pelvic floor dysfunction in female   4. Prolapse of anterior vaginal wall    Patient is not longer taking Gemtesa  75mg  daily. Patient has improved greatly with PT and plans to continue her exercises.  Patient is potentially interested in intercourse. We discussed that her prolapse does not mean she cannot have intercourse. We did discuss using lubrication for intercourse, preferably a silicon blend for glide.  We discussed that the prolapse is a quality of life issue and that we can discuss pessary or surgical options if she would like. We talked about pessary use and that she could learn to take it in and out and at this time she would like to hold off. This can be done in the future if needed.    Patient to follow up in 6 months or sooner if needed. If patient wants to pursue surgery would need urodynamics.   Xcaret Morad G Noralyn Karim, NP

## 2024-02-18 ENCOUNTER — Ambulatory Visit: Admitting: Physical Therapy

## 2024-02-18 ENCOUNTER — Encounter: Payer: Self-pay | Admitting: Physical Therapy

## 2024-02-18 DIAGNOSIS — R278 Other lack of coordination: Secondary | ICD-10-CM | POA: Diagnosis not present

## 2024-02-18 DIAGNOSIS — M6289 Other specified disorders of muscle: Secondary | ICD-10-CM

## 2024-02-18 DIAGNOSIS — M6281 Muscle weakness (generalized): Secondary | ICD-10-CM

## 2024-02-18 NOTE — Therapy (Signed)
 OUTPATIENT PHYSICAL THERAPY FEMALE PELVIC TREATMENT   Patient Name: FRONNIE URTON MRN: [id number] DOB:1955-11-19, 68 y.o., female Today's Date: 02/18/2024  END OF SESSION:  PT End of Session - 02/18/24 0932     Visit Number 6    Date for PT Re-Evaluation 03/06/24    Authorization Type [employer] medicare    Authorization Time Period 12/13/2023 - 03/06/2024    Authorization - Visit Number 5    Authorization - Number of Visits 12    Progress Note Due on Visit 10    PT Start Time 0930    PT Stop Time 1010    PT Time Calculation (min) 40 min    Activity Tolerance Patient tolerated treatment well    Behavior During Therapy WFL for tasks assessed/performed          Past Medical History:  Diagnosis Date   Arthritis    Diabetes mellitus without complication (HCC)    Edema    in left leg in 1980s on left ankle    Hyperlipidemia    Hypertension    Neuropathy    PCOS (polycystic ovarian syndrome)    Spinal cord stroke (HCC)    01/2019   Superficial thrombophlebitis    Swelling    Venous insufficiency    Vitamin D  deficiency    Past Surgical History:  Procedure Laterality Date   BREAST BIOPSY Right    FOOT ARTHRODESIS Right 10/10/2020   Procedure: Right talonavicular and subtalar joint arthrodesis;  Surgeon: Amada Backer, MD;  Location: [facility];  Service: Orthopedics;  Laterality: Right;   GASTROC RECESSION EXTREMITY Right 10/10/2020   Procedure: Gastroc recession;  Surgeon: Amada Backer, MD;  Location: [facility];  Service: Orthopedics;  Laterality: Right;   JOINT REPLACEMENT  2011    right hip    left ankle surgery      TOTAL HIP ARTHROPLASTY Left 12/02/2015   Procedure: LEFT TOTAL HIP ARTHROPLASTY ANTERIOR APPROACH;  Surgeon: Liliane Rei, MD;  Location: [facility];  Service: Orthopedics;  Laterality: Left;   Patient Active Problem List   Diagnosis Date Noted   Disease of spinal cord (HCC) 04/01/2020   Numbness 04/01/2020   Dysesthesia  03/29/2019   Gait disturbance 03/29/2019   Spinal cord infarction (HCC) 02/20/2019   Cauda equina syndrome (HCC) 02/13/2019   DM2 (diabetes mellitus, type 2) (HCC) 02/13/2019   Syncope 02/13/2019   Essential hypertension 02/13/2019   Class 3 severe obesity with serious comorbidity and body mass index (BMI) of 45.0 to 49.9 in adult 10/04/2017   Class 3 severe obesity without serious comorbidity with body mass index (BMI) of 45.0 to 49.9 in adult 04/12/2017   Vitamin D  deficiency 04/12/2017   Other fatigue 12/31/2016   SOB (shortness of breath) on exertion 12/31/2016   Type 2 diabetes mellitus without complication, without long-term current use of insulin  (HCC) 12/31/2016   Morbid obesity (HCC) 12/31/2016   OA (osteoarthritis) of hip 12/02/2015    PCP: Faustina Hood, MD  REFERRING PROVIDER: Zuleta, Kaitlin G, NP   REFERRING DIAG:  N39.41 (ICD-10-CM) - Urge incontinence  N32.81 (ICD-10-CM) - OAB (overactive bladder)  M62.89 (ICD-10-CM) - Pelvic floor dysfunction in female  N81.10 (ICD-10-CM) - Prolapse of anterior vaginal wall    THERAPY DIAG:  Other lack of coordination  Muscle weakness (generalized)  Pelvic floor dysfunction in female  Rationale for Evaluation and Treatment: Rehabilitation  ONSET DATE: 02/10/2019  SUBJECTIVE:  SUBJECTIVE STATEMENT: I felt good after last visit and I felt I could do more. I got up 2 times last night at 2:30 AM and 7:00 compared 4-6 times. I see the doctor tomorrow.    PAIN:  Are you having pain? No  PRECAUTIONS: None  RED FLAGS: None   WEIGHT BEARING RESTRICTIONS: No  FALLS:  Has patient fallen in last 6 months? No  OCCUPATION: retired  ACTIVITY LEVEL : goes to the [employer] 3 days per week  PLOF: Independent  PATIENT GOALS: improve the leakage  and gain control of the bladder to improve the quality of life  PERTINENT HISTORY:  Diabetes; PCOS; Hypertension; bil. Hip replacement; spinal stroke Sexual abuse: No  BOWEL MOVEMENT: Pain with bowel movement: No Type of bowel movement:Type (Bristol Stool Scale) Type 1 or Type 4, Frequency daily multiple times, Strain once in awhile, and Splinting none Fully empty rectum: No, will wipe the anus to help the stool comes out Leakage: Yes: happens as she is walking to the bathroom Pads: No, wear them for a rode trip Fiber supplement/laxative No  URINATION: Pain with urination: No Fully empty bladder: Yes: most of the time, sometimes she will have to urinate again before she leaves the bathroom Stream: Strong and Weak Urgency: Yes  Frequency: average, will not drink fluid when she has to go out Leakage: Urge to void and Walking to the bathroom Pads: Yes: only when she is going on a road trip  INTERCOURSE: not active   PREGNANCY: Vaginal deliveries 2 Tearing No   PROLAPSE: Bulge   OBJECTIVE:  Note: Objective measures were completed at Evaluation unless otherwise noted.  DIAGNOSTIC FINDINGS:  stage II/IV anterior vaginal prolapse with stage I/IV uterine prolapse   PATIENT SURVEYS:  CRAIQ-7: 38 UIQ-7: 38 PFIQ-7: 77 02/18/24: CRAIQ-7: 14 UIQ-7: 14 PFIQ-7: 31  COGNITION: Overall cognitive status: Within functional limits for tasks assessed     SENSATION: Light touch: Deficits decreased sensation of the bottom of the left foot   GAIT: Assistive device utilized: None  POSTURE: No Significant postural limitations   LUMBARAROM/PROM:lumbar ROM is full   LOWER EXTREMITY ROM:  Passive ROM Right eval Left eval  Hip internal rotation 10 10   (Blank rows = not tested)  LOWER EXTREMITY MMT:  MMT Right eval Left eval Right 02/18/24 Left 02/18/24  Hip flexion 4/5 4/5 5/5 4+/5  Hip extension 4/5 4/5 5/5 4/5  Hip abduction 3-/5 3/5 4/5 3/5  Hip adduction 5/5 5/5  5/5 5/5   (Blank rows = not tested) PALPATION:   General: Patient has lymphedema in the left leg  Pelvic Alignment: ASIS are equal  Abdominal: some difficulty with contracting the lower abdomen due to trying to lift the rib cage.                 External Perineal Exam: perineal area intact, tightness along the perineal body                             Internal Pelvic Floor: decreased sensation of the left side of the vulvar area  Patient confirms identification and approves PT to assess internal pelvic floor and treatment Yes  PELVIC MMT:   MMT eval 02/09/24  Vaginal 2/5 with tightness on the sides of the introitus   Internal Anal Sphincter 2/5 4/5  External Anal Sphincter 2/5 4/5  Puborectalis 2/5 and needed taping of the  muscles to come forward 4/5  Diastasis Recti none   (Blank rows = not tested)        TONE: average  PROLAPSE: Anterior wall weakness coming to the introitus when patient bears down  TODAY'S TREATMENT:  02/18/24 Self care Educated patient on what a uterine prolapse is and how a pessary can assist it.  Exercises: Strengthening:( all have the core and pelvic floor engaged) Prone lift arm and leg with core engaged, breath out with lifting 10 x 2 Prone knees bent bilateral hip rotation 2 x 10 Prone bilateral shoulder retraction 1 x 10 Hip adduction laying on side 2 x 10  Supine straight leg raise 2 x 10   02/09/24 Manual: Internal pelvic floor techniques: No emotional/communication barriers or cognitive limitation. Patient is motivated to learn. Patient understands and agrees with treatment goals and plan. PT explains patient will be examined in standing, sitting, and lying down to see how their muscles and joints work. When they are ready, they will be asked to remove their underwear so PT can examine their perineum. The patient is also given the option of providing their own chaperone as one is not provided in our facility. The patient also has the right  and is explained the right to defer or refuse any part of the evaluation or treatment including the internal exam. With the patient's consent, PT will use one gloved finger to gently assess the muscles of the pelvic floor, seeing how well it contracts and relaxes and if there is muscle symmetry. After, the patient will get dressed and PT and patient will discuss exam findings and plan of care. PT and patient discuss plan of care, schedule, attendance policy and HEP activities.  Therapist gloved finger in the rectum performing manual work to the puborectalis and superior transverse perineum to lengthen for contraction Neuromuscular re-education: Pelvic floor contraction training: Therapist gloved finger in the rectum working on quick contractions: 10 x in 13 sec, 13.31 sec, 10.71 sec, 10.95 sec,9.53 Therapist gloved finger in the rectum working on endurance: Hold 33 sec,27 sec, 41.45 sec Exercises: Strengthening: Bridge with ball squeeze with pelvic floor contraction  with VC to press heels into mat Supine with pressing ball into hip to engage the lower abdomen and pelvic floor. Yellow band for hip adduction  and holding 1 # 15 x each  Sit to stand holding 10 # with pelvic floor contraction    02/04/24 Manual: Soft tissue mobilization: Circular massage to the abdomen to work on peristalic motion of the intestines and educated patient on how to perform at home Manual work to the diaphragm to elongate NExercises: Strengthening: Bridge with ball squeeze with pelvic floor contraction  with VC to press heels into mat Dead bug 20 x with verbal cues to contract the abdominals Straight leg raise with abdominal contraction 10 x each and breath out as she lifts her leg Hip adduction 2 x 10 laying on her side   PATIENT EDUCATION:  02/18/24 Education details: Access Code: [email or web address], educated patient several different options for treatment of pelvic floor prolapse Person educated: Patient Education  method: Facilities manager, Actor cues, Verbal cues, and Handouts Education comprehension: verbalized understanding, returned demonstration, verbal cues required, tactile cues required, and needs further education  HOME EXERCISE PROGRAM: 02/18/24 Access Code: [email or web address] URL: [email or web address] Date: 02/18/2024 Prepared by: Marsha Skeen  Exercises - Supine Pelvic Floor Contraction  - 3 x daily - 7 x weekly - 10 reps - 10 sec hold - Supine Bridge with Mini Swiss Ball Between Knees  -  1 x daily - 7 x weekly - 1 sets - 10 reps - Seated March  - 1 x daily - 3 x weekly - 3 sets - 10 reps - Side Stepping with Resistance at Thighs  - 1 x daily - 3 x weekly - 1 sets - 10 reps - Dead Bug  - 1 x daily - 3 x weekly - 2 sets - 10 reps - Supine Pelvic Tilt with Straight Leg Raise  - 1 x daily - 3 x weekly - 2 sets - 10 reps - Sidelying Hip Adduction  - 1 x daily - 3 x weekly - 2 sets - 10 reps - Prone Alternating Arm and Leg Lifts  - 1 x daily - 3 x weekly - 2 sets - 10 reps - Prone Bent Leg Fallout  - 1 x daily - 3 x weekly - 2 sets - 10 reps - Prone W Scapular Retraction  - 1 x daily - 3 x weekly - 1 sets - 10 reps  Patient Education - Abdominal Massage for Constipation   ASSESSMENT:  CLINICAL IMPRESSION: Patient is a 68 y.o. female who was seen today for physical therapy  treatment for fecal incontinence, urge incontinence, anterior wall prolapse, and pelvic floor dysfunction. Patient had a spinal stroke on  02/10/2019 and since then she had issues with urinary and fecal leakage and prolapse.  She is able to hold her urine to walk to the bathroom and stool. Her PFIQ-7 score has improved from 77 to 31. She is independent with her HEP. Rectal strength increased to 4/5. Patient understands what a uterine prolapse is and how a pessary may help her.   OBJECTIVE IMPAIRMENTS: decreased coordination, decreased endurance, decreased strength, and increased fascial restrictions.   ACTIVITY  LIMITATIONS: continence, toileting, and locomotion level  PARTICIPATION LIMITATIONS: driving, shopping, and community activity  PERSONAL FACTORS: Age, Time since onset of injury/illness/exacerbation, and 3+ comorbidities: Diabetes; PCOS; Hypertension; bil. Hip replacement; spinal stroke are also affecting patient's functional outcome.   REHAB POTENTIAL: Good  CLINICAL DECISION MAKING: Unstable/unpredictable  EVALUATION COMPLEXITY: High   GOALS: Goals reviewed with patient? Yes  SHORT TERM GOALS: Target date: 01/10/24  Patient independent with initial HEP for pelvic floor contraction.  Baseline: Goal status: Met 12/20/23  2.  Patient is able to feel the pelvic floor contraction to understands how to strengthen.  Baseline:  Goal status: Met 12/27/23  3.  Patient was educated in the urge to void.  Baseline:  Goal status: Met 12/27/23   LONG TERM GOALS: Target date: 03/06/24  Patient independent with advanced HEP for core and pelvic floor strength.  Baseline:  Goal status: Met 02/18/24  2.  Patient rectal strength increased >/= 3/5 with the puborectalis coming forward to reduce her fecal leakage.  Baseline:  Goal status: Met 02/18/24  3.  Patient is able to have the urge to urinate and walk to the bathroom with minimal to no urinary leakage.  Baseline:   Goal status: Met 02/18/24  4.  Patient is able to have the urge to defecate and walk to the bathroom with minimal to no fecal leakage.  Baseline:  Goal status: Met 02/18/24  5.  PFIQ-7 score goes from  77 to </= 25 points due to feeling she is able to go out in the community without the fear of leakage.  Baseline:  Goal status:Met 02/18/24  PLAN: Discharge to HEP. Patient will be gone for one month and work on her strength. If she  needs therapy in the future then she is to return.    Marsha Skeen, PT 02/18/24 10:17 AM

## 2024-02-21 ENCOUNTER — Encounter: Payer: Self-pay | Admitting: Physical Therapy

## 2024-03-01 ENCOUNTER — Encounter: Admitting: Physical Therapy

## 2024-06-22 ENCOUNTER — Encounter: Payer: Self-pay | Admitting: Neurology

## 2024-06-22 ENCOUNTER — Ambulatory Visit (INDEPENDENT_AMBULATORY_CARE_PROVIDER_SITE_OTHER): Payer: Medicare Other | Admitting: Neurology

## 2024-06-22 ENCOUNTER — Telehealth: Payer: Self-pay | Admitting: Neurology

## 2024-06-22 VITALS — BP 155/82 | HR 76 | Ht 65.0 in | Wt 238.0 lb

## 2024-06-22 DIAGNOSIS — R32 Unspecified urinary incontinence: Secondary | ICD-10-CM

## 2024-06-22 DIAGNOSIS — R208 Other disturbances of skin sensation: Secondary | ICD-10-CM | POA: Diagnosis not present

## 2024-06-22 DIAGNOSIS — G959 Disease of spinal cord, unspecified: Secondary | ICD-10-CM

## 2024-06-22 DIAGNOSIS — R200 Anesthesia of skin: Secondary | ICD-10-CM | POA: Diagnosis not present

## 2024-06-22 DIAGNOSIS — R269 Unspecified abnormalities of gait and mobility: Secondary | ICD-10-CM

## 2024-06-22 MED ORDER — GABAPENTIN 300 MG PO CAPS: ORAL_CAPSULE | ORAL | 4 refills | Status: DC

## 2024-06-22 MED ORDER — GABAPENTIN 300 MG PO CAPS: ORAL_CAPSULE | ORAL | 4 refills | Status: AC

## 2024-06-22 NOTE — Progress Notes (Signed)
 [facility]  PATIENT: Laura Blackwell DOB: 1956-05-18  REFERRING DOCTOR OR PCP: Alberta Sharps, MD SOURCE: Patient, Notes from Dr. Sharps, imaging and lab reports, multiple MRIs of the spine and brain personally reviewed  _________________________________   HISTORICAL  CHIEF COMPLAINT:  Chief Complaint  Patient presents with   Follow-up    Pt in room 10. Alone. Here for disease of spinal cord followup.    HISTORY OF PRESENT ILLNESS:  Laura Blackwell is a 68 year old woman with a spinal cord lesion.  Update 06/22/2024: She has a T12/L1 transverse myelitis (idiopathic) since 01/2019.      She reports dysesthesias and balance have improved.    Gabapentin  has helped the burning sensation in the left buttock and down.   The right side has been mild in general since 2021.    Initially, her right side was worse but it has improved.  She has noted mild improved numbness in the perineum (more on the right).    She takes gabapentin  300 mg qAM and 300 mg qPM and 600 mg po qHS.   She is reluctant to increase the dose or try a different medication (I suggested lamotrigine).   Te sheet rubbing against her is painful at night.    She is walking better and just uses the cane as needed   She needs to hold tigtly on rails on stairs.   She has left leg weakness and some foot drop -- worse if walking >100 feet.or if hot .   The left leg needs to be lifted at times on curbs.    She is doing pelvic floor exercises/PT and her bowel and urinary urgency are better - incontinence now just a 1-2 times a month.   She stopped hyoscyamine  and moved to Gemtesa  with better control..   She sometimes wears Depends outside the house.   She always makes sure she has wipes/etc   She was on Mounjoro for DM but stopped as it was not helping much.     She has edema in her left leg since an operation many years ago.  She usually wears compression stockings (did not today due to exam).    She is  going to the gym a couple times a week.   She feels she has been able to do more and exercises are easier than initially.  Transverse myelitis history Laura Blackwell  had the onset of lower back pain and leg weakness 02/10/2019. She was at work, when she stood up she fell flat on her face.    She did not get better over the next 2 days and also had urinary incontinence and presented to the ED 02/13/19.  She was noted to have no sensation in the perineum and sacral area.  She had incontinence of bowel and bladder. She had some left leg weakness.    She was admitted and MRI showed an abnormal focus in the conus medullaris and lower spinal cord adjacent to T12-L1.  This was felt to be a demyelinating lesion versus stroke and she received 5 days of high-dose IV steroids.  While in the hospital she had a lumbar puncture.  Oligoclonal bands were not present.   She has had DM x 7 years and has been on insulin  x 1 year.   She denies any numbness in her feet or eye or kidney issues related to DM  Imaging: 02/13/2019: MRI of the lumbar spine showed abnormal signal in the conus medullaris.  There is  severe facet hypertrophy with 5 mm of anterolisthesis and moderately severe spinal stenosis at L4-L5.  Mild degenerative changes at the other levels. 02/14/2019: MRI of the thoracic (with and without) and lumbar spine (with contrast)  There is a lesion of the conus medullaris and distal thoracic spinal cord.  It does not enhance.   02/16/2019: MRI of the brain and cervical spine.  The brain was normal for age.  The spinal cord was normal.  There was some degenerative changes noted at the C5-C6 with left foraminal narrowing. 02/18/2019: CT angiogram of the head and neck were normal.  05/17/2020 brain MRI was normal  05/17/2020 thoracic spine MRI showed abnormal lesion at L1/conus as before.  No enhancement.    Laboratory tests (2020): NMO-IgG Ab was negative.   HIV negative.   ESR mildly elevated at 32.  CSF showed no  oligoclonal bands.   CSF proteins and glucose mildly elevated.  7 WBC.   CSF VDRL negative.      REVIEW OF SYSTEMS: Constitutional: No fevers, chills, sweats, or change in appetite Eyes: No visual changes, double vision, eye pain Ear, nose and throat: No hearing loss, ear pain, nasal congestion, sore throat Cardiovascular: No chest pain, palpitations Respiratory:  No shortness of breath at rest or with exertion.   No wheezes GastrointestinaI: No nausea, vomiting, diarrhea.  She has fecal urgency  genitourinary:  No dysuria, urinary retention.  Urgency as above  musculoskeletal:  No neck pain, back pain Integumentary: No rash, pruritus, skin lesions Neurological: as above Psychiatric: No depression at this time.  No anxiety Endocrine: No palpitations, diaphoresis, change in appetite, change in weigh or increased thirst.  She has insulin -dependent type 2 diabetes. Hematologic/Lymphatic:  No anemia, purpura, petechiae. Allergic/Immunologic: No itchy/runny eyes, nasal congestion, recent allergic reactions, rashes  ALLERGIES: Allergies  Allergen Reactions   Jardiance [Empagliflozin]     Yeast infection    HOME MEDICATIONS:  Current Outpatient Medications:    atorvastatin  (LIPITOR) 20 MG tablet, Take 20 mg by mouth daily. , Disp: , Rfl:    furosemide  (LASIX ) 20 MG tablet, Take 20 mg by mouth 2 (two) times daily. , Disp: , Rfl:    gabapentin  (NEURONTIN ) 300 MG capsule, TAKE 1 CAPSULE BY MOUTH IN THE  MORNING, ONE IN AFTERNOON AND 2 CAPSULES BY MOUTH  AT BEDTIME, Disp: 360 capsule, Rfl: 3   glucose blood test strip, 1 each by Other route 2 (two) times daily. Use as instructed, Disp: , Rfl:    Insulin  Pen Needle (BD PEN NEEDLE NANO U/F) 32G X 4 MM MISC, 1 each by Does not apply route daily., Disp: 100 each, Rfl: 0   metFORMIN  (GLUCOPHAGE ) 500 MG tablet, Take 1,000 mg by mouth 2 (two) times daily with a meal. , Disp: , Rfl:    telmisartan (MICARDIS) 80 MG tablet, Take 80 mg by mouth daily.,  Disp: , Rfl:    Vibegron  75 MG TABS, Take 1 tablet (75 mg total) by mouth daily., Disp: 90 tablet, Rfl: 3  PAST MEDICAL HISTORY: Past Medical History:  Diagnosis Date   Arthritis    Diabetes mellitus without complication (HCC)    Edema    in left leg in 1980s on left ankle    Hyperlipidemia    Hypertension    Neuropathy    PCOS (polycystic ovarian syndrome)    Spinal cord stroke (HCC)    01/2019   Superficial thrombophlebitis    Swelling    Venous insufficiency    Vitamin  D deficiency     PAST SURGICAL HISTORY: Past Surgical History:  Procedure Laterality Date   BREAST BIOPSY Right    FOOT ARTHRODESIS Right 10/10/2020   Procedure: Right talonavicular and subtalar joint arthrodesis;  Surgeon: Kit Rush, MD;  Location: [facility];  Service: Orthopedics;  Laterality: Right;   GASTROC RECESSION EXTREMITY Right 10/10/2020   Procedure: Gastroc recession;  Surgeon: Kit Rush, MD;  Location: [facility];  Service: Orthopedics;  Laterality: Right;   JOINT REPLACEMENT  2011    right hip    left ankle surgery      TOTAL HIP ARTHROPLASTY Left 12/02/2015   Procedure: LEFT TOTAL HIP ARTHROPLASTY ANTERIOR APPROACH;  Surgeon: Dempsey Moan, MD;  Location: [facility];  Service: Orthopedics;  Laterality: Left;    FAMILY HISTORY: Family History  Problem Relation Age of Onset   Diabetes Mother    Hypertension Mother     SOCIAL HISTORY:  Social History   Socioeconomic History   Marital status: Single    Spouse name: Not on file   Number of children: Not on file   Years of education: Not on file   Highest education level: Not on file  Occupational History   Occupation: Registration Specialist  Tobacco Use   Smoking status: Former    Current packs/day: 0.00    Types: Cigarettes    Quit date: 10/01/2002    Years since quitting: 21.7   Smokeless tobacco: Never  Vaping Use   Vaping status: Never Used  Substance and Sexual Activity   Alcohol use: No    Drug use: No   Sexual activity: Not Currently  Other Topics Concern   Not on file  Social History Narrative   Lives with son who is 26 (03/29/19)   Caffeine use: Hot tea daily   Right handed    Social Drivers of Health   Financial Resource Strain: Not on file  Food Insecurity: Not on file  Transportation Needs: Not on file  Physical Activity: Not on file  Stress: Not on file  Social Connections: Not on file  Intimate Partner Violence: Not on file     PHYSICAL EXAM  Vitals:   06/22/24 0956  BP: (!) 155/82  Pulse: 76  Weight: 238 lb (108 kg)  Height: 5' 5 (1.651 m)     Body mass index is 39.61 kg/m.   General: The patient is well-developed and well-nourished and in no acute distress  HEENT:  Head is [place]/AT.  Sclera are anicteric.    Skin: Extremities are without rash.  She has severe edema in the left leg (old)  Musculoskeletal:  Back is nontender  Neurologic Exam  Mental status: The patient is alert and oriented x 3 at the time of the examination. The patient has apparent normal recent and remote memory, with an apparently normal attention span and concentration ability.   Speech is normal.  Cranial nerves: Extraocular movements are full.  Color vision is symmetric.  Normal facial strength.  Trapezius and sternocleidomastoid strength is normal. No dysarthria is noted. No obvious hearing deficits are noted.  Motor:  Muscle bulk is normal.   Tone is normal. Strength appears  5 / 5 in all 4 extremities.  However, with functional testing using her weight, she has mild reduced gastrocnemius strength on the left.     Sensory: Sensory testing is intact to pinprick, soft touch and vibration sensation in the arms.  Reduced S1/S2 sensation to touch and temperature, worse on left (  absent S1 and lower).   Reduced vibration sensation in right foot relative left.     Coordination: Cerebellar testing reveals good finger-nose-finger and heel-to-shin bilaterally.  Gait and  station: Station is normal.   She is able to walk in the room without a cane.   No definite foot drop  Romberg is negative.  Reflexes: Deep tendon reflexes are symmetric and normal bilaterally.      DIAGNOSTIC DATA (LABS, IMAGING, TESTING) - I reviewed patient records, labs, notes, testing and imaging myself where available.  Lab Results  Component Value Date   WBC 8.0 06/06/2019   HGB 12.9 06/06/2019   HCT 38.6 06/06/2019   MCV 81 06/06/2019   PLT 275 06/06/2019      Component Value Date/Time   NA 140 10/08/2020 1230   NA 141 05/04/2017 0818   K 4.8 10/08/2020 1230   CL 107 10/08/2020 1230   CO2 25 10/08/2020 1230   GLUCOSE 106 (H) 10/08/2020 1230   BUN 15 10/08/2020 1230   BUN 11 05/04/2017 0818   CREATININE 0.54 10/08/2020 1230   CALCIUM  9.9 10/08/2020 1230   PROT 6.9 02/05/2023 1204   ALBUMIN 4.2 02/05/2023 1204   AST 12 02/05/2023 1204   ALT 10 02/05/2023 1204   ALKPHOS 97 02/05/2023 1204   BILITOT 0.2 02/05/2023 1204   GFRNONAA >60 10/08/2020 1230   GFRAA >60 02/15/2019 0454   Lab Results  Component Value Date   CHOL 163 02/18/2019   HDL 60 02/18/2019   LDLCALC 81 02/18/2019   TRIG 110 02/18/2019   CHOLHDL 2.7 02/18/2019   Lab Results  Component Value Date   HGBA1C 7.5 (H) 02/14/2019   Lab Results  Component Value Date   VITAMINB12 513 12/31/2016   Lab Results  Component Value Date   TSH 0.797 06/06/2019       ASSESSMENT AND PLAN    1. Disease of spinal cord (HCC)   2. Urinary incontinence, unspecified type   3. Perineal numbness   4. Dysesthesia   5. Gait disturbance      1.   She developed idiopathic transverse myelitis or spinal cord infarction at T12/L1 in 2020.  She has numbness at and below the S1 dermatome, worse on the left associated with dysesthesias .  Weakness is now milder in S1 innervated muscles .   Bladder/bowel are doing better. 2.  Continue gabapentin    300 mg to 1-1-2 pills with refills 3.   Continue pelvic exercises  and Gemtesa     4.   RTC 12 months or sooner if new or worsening symptoms  This visit is part of a comprehensive longitudinal care medical relationship regarding the patients primary diagnosis of transverse myelitis and related concerns.     Kemar Pandit A. Vear, MD, [facility] 06/22/2024, 10:12 AM Certified in Neurology, Clinical Neurophysiology, Sleep Medicine and Neuroimaging  [facility] [address], [address] [place], [place] [address] [phone]

## 2024-06-22 NOTE — Telephone Encounter (Signed)
Rx sent to [facility]

## 2024-06-22 NOTE — Telephone Encounter (Signed)
 Pt checking out, states she needs her 90 day refill of gabapentin  (NEURONTIN ) 300 MG capsule sent to [facility] not [facility].

## 2024-08-11 ENCOUNTER — Encounter: Payer: Self-pay | Admitting: Obstetrics and Gynecology

## 2024-08-11 ENCOUNTER — Ambulatory Visit: Admitting: Obstetrics and Gynecology

## 2024-08-11 VITALS — BP 119/81 | HR 85

## 2024-08-11 DIAGNOSIS — N8110 Cystocele, unspecified: Secondary | ICD-10-CM

## 2024-08-11 DIAGNOSIS — N3281 Overactive bladder: Secondary | ICD-10-CM

## 2024-08-11 DIAGNOSIS — N812 Incomplete uterovaginal prolapse: Secondary | ICD-10-CM

## 2024-08-11 DIAGNOSIS — N952 Postmenopausal atrophic vaginitis: Secondary | ICD-10-CM

## 2024-08-11 NOTE — Progress Notes (Addendum)
 [facility]   Subjective:     Chief Complaint: Follow-up (Laura Blackwell is a 68 y.o. female is here for follow up visit.)  History of Present Illness: Laura Blackwell is a 68 y.o. female with stage II pelvic organ prolapse who presents today for a pessary fitting.    Past Medical History: Patient  has a past medical history of Arthritis, Diabetes mellitus without complication (HCC), Edema, Hyperlipidemia, Hypertension, Neuropathy, PCOS (polycystic ovarian syndrome), Spinal cord stroke (HCC), Superficial thrombophlebitis, Swelling, Venous insufficiency, and Vitamin D  deficiency.   Past Surgical History: She  has a past surgical history that includes left ankle surgery ; Joint replacement (2011 ); Total hip arthroplasty (Left, 12/02/2015); Breast biopsy (Right); Gastroc recession extremity (Right, 10/10/2020); and Foot arthrodesis (Right, 10/10/2020).   Medications: She has a current medication list which includes the following prescription(s): atorvastatin , furosemide , gabapentin , glucose blood, insulin  pen needle, metformin , telmisartan, and vibegron .   Allergies: Patient is allergic to jardiance [empagliflozin].   Social History: Patient  reports that she quit smoking about 21 years ago. Her smoking use included cigarettes. She has never used smokeless tobacco. She reports that she does not drink alcohol and does not use drugs.      Objective:    BP 119/81   Pulse 85  Gen: No apparent distress, A&O x 3. Pelvic Exam: Normal external female genitalia; Bartholin's and Skene's glands normal in appearance; urethral meatus normal in appearance, no urethral masses or discharge.   Attempted a #5 Incontinence Dish, which was too large for patient's vaginal length Attempted a #4 Incontinence Ring which was expelled with valsalva  A size #2 cube pessary was fitted. It was comfortable, stayed in place with valsalva and was an appropriate size on examination, with one  finger fitting between the pessary and the vaginal walls. Patient was able to expel the pessary while sitting on the toilet with a hard push. Patient demonstrated proper removal and replacement.    Assessment/Plan:    Assessment: Laura Blackwell is a 68 y.o. with stage II pelvic organ prolapse who presents for a pessary fitting. Plan: She was fitted with a #2 cube pessary. She will remove at least once per week. She will use lubricant.   Follow-up in 1 months for a pessary check or sooner as needed.  All questions were answered.    Laura Man G Dekayla Prestridge, NP

## 2024-08-11 NOTE — Patient Instructions (Signed)
 Wash pessary with warm water and soap (Dove or cetaphil) Take it out at least once a week or more often as needed.   Please call if any discomfort, bleeding, or irritation.   Samples of lubrication given to see what you prefer.

## 2024-08-17 ENCOUNTER — Ambulatory Visit: Admitting: Obstetrics and Gynecology

## 2024-08-17 VITALS — BP 131/84 | HR 103

## 2024-08-17 DIAGNOSIS — M6289 Other specified disorders of muscle: Secondary | ICD-10-CM

## 2024-08-17 DIAGNOSIS — N8110 Cystocele, unspecified: Secondary | ICD-10-CM

## 2024-08-17 DIAGNOSIS — N812 Incomplete uterovaginal prolapse: Secondary | ICD-10-CM

## 2024-08-17 NOTE — Progress Notes (Signed)
 [facility]   Subjective:     Chief Complaint: Follow-up (pessary fell out )  History of Present Illness: Laura Blackwell is a 68 y.o. female with stage II pelvic organ prolapse who presents today for a pessary fitting, she was using a #2 cube which fell out and was flushed down the toilet with a bowel movement.    Past Medical History: Patient  has a past medical history of Arthritis, Diabetes mellitus without complication (HCC), Edema, Hyperlipidemia, Hypertension, Neuropathy, PCOS (polycystic ovarian syndrome), Spinal cord stroke (HCC), Superficial thrombophlebitis, Swelling, Venous insufficiency, and Vitamin D  deficiency.   Past Surgical History: She  has a past surgical history that includes left ankle surgery ; Joint replacement (2011 ); Total hip arthroplasty (Left, 12/02/2015); Breast biopsy (Right); Gastroc recession extremity (Right, 10/10/2020); and Foot arthrodesis (Right, 10/10/2020).   Medications: She has a current medication list which includes the following prescription(s): atorvastatin , furosemide , gabapentin , glucose blood, insulin  pen needle, metformin , telmisartan, and vibegron .   Allergies: Patient is allergic to jardiance [empagliflozin].   Social History: Patient  reports that she quit smoking about 21 years ago. Her smoking use included cigarettes. She has never used smokeless tobacco. She reports that she does not drink alcohol and does not use drugs.      Objective:    BP 131/84   Pulse (!) 103  Gen: No apparent distress, A&O x 3. Pelvic Exam: Normal external female genitalia; Bartholin's and Skene's glands normal in appearance; urethral meatus normal in appearance, no urethral masses or discharge.   Attempted a #4 cube which was too large for patient and painful to insert.   A size #3 cube pessary was fitted. It was comfortable, stayed in place with valsalva and was an appropriate size on examination, with one finger fitting between the  pessary and the vaginal walls. Patient demonstrated proper removal and replacement.    Assessment/Plan:    Assessment: [name] is a 68 y.o. with stage II pelvic organ prolapse who presents for a pessary fitting. Plan: She was fitted with a #3 cube pessary. She will remove at least once per week. She will use lubricant.   Follow-up in 1 months for a pessary check or sooner as needed.  All questions were answered.    Karis Emig G Flynt Breeze, NP

## 2024-09-11 ENCOUNTER — Encounter: Payer: Self-pay | Admitting: *Deleted

## 2024-09-12 ENCOUNTER — Ambulatory Visit: Admitting: Obstetrics and Gynecology

## 2024-09-15 ENCOUNTER — Ambulatory Visit: Admitting: Obstetrics and Gynecology

## 2024-09-15 ENCOUNTER — Encounter: Payer: Self-pay | Admitting: Obstetrics and Gynecology

## 2024-09-15 VITALS — BP 116/58 | HR 99

## 2024-09-15 DIAGNOSIS — N952 Postmenopausal atrophic vaginitis: Secondary | ICD-10-CM

## 2024-09-15 DIAGNOSIS — N812 Incomplete uterovaginal prolapse: Secondary | ICD-10-CM

## 2024-09-15 DIAGNOSIS — Z960 Presence of urogenital implants: Secondary | ICD-10-CM | POA: Diagnosis not present

## 2024-09-15 MED ORDER — ESTRADIOL 0.01 % VA CREA: TOPICAL_CREAM | VAGINAL | 11 refills | Status: AC

## 2024-09-15 NOTE — Progress Notes (Signed)
 [facility]   Subjective:     Chief Complaint: Follow-up (Laura Blackwell is a 69 y.o. female here today for a pessary check)  History of Present Illness: Laura Blackwell is a 69 y.o. female with stage II pelvic organ prolapse who presents today for a pessary check. She has a #3 cube pessary which is working well for her.   She is removing the pessary herself nightly. Noticed some vaginal spotting last week. She is not using estrogen cream but is using good clean love lubricant.   Past Medical History: Patient  has a past medical history of Arthritis, Diabetes mellitus without complication (HCC), Edema, Hyperlipidemia, Hypertension, Neuropathy, PCOS (polycystic ovarian syndrome), Spinal cord stroke (HCC), Superficial thrombophlebitis, Swelling, Venous insufficiency, and Vitamin D  deficiency.   Past Surgical History: She  has a past surgical history that includes left ankle surgery ; Joint replacement (2011 ); Total hip arthroplasty (Left, 12/02/2015); Breast biopsy (Right); Gastroc recession extremity (Right, 10/10/2020); and Foot arthrodesis (Right, 10/10/2020).   Medications: She has a current medication list which includes the following prescription(s): atorvastatin , furosemide , gabapentin , glucose blood, insulin  pen needle, metformin , telmisartan, and vibegron .   Allergies: Patient is allergic to jardiance [empagliflozin].   Social History: Patient  reports that she quit smoking about 21 years ago. Her smoking use included cigarettes. She has never used smokeless tobacco. She reports that she does not drink alcohol and does not use drugs.      Objective:    BP (!) 116/58   Pulse 99  Gen: No apparent distress, A&O x 3. Pelvic Exam: Normal external female genitalia; Bartholin's and Skene's glands normal in appearance; urethral meatus normal in appearance, no urethral masses or discharge.   Cube was noted to be in place. Speculum showed small non-bleeding  abrasion on the posterior vaginal wall. Pessary was cleaned and replaced.    Assessment/Plan:    Assessment: Ms. Bress is a 69 y.o. with stage II pelvic organ prolapse for a pessary check  Plan: She was fitted with a #3 cube pessary. She will remove at least once per week.  - Recommended and ordered estrace  cream to use twice a week - We discussed that if she notices further vaginal bleeding, then will order pelvic US   Follow-up in 3 months or sooner if needed All questions were answered.    Rosaline LOISE Caper, MD

## 2024-09-27 ENCOUNTER — Ambulatory Visit: Admitting: Obstetrics and Gynecology

## 2024-09-27 ENCOUNTER — Encounter: Payer: Self-pay | Admitting: Obstetrics and Gynecology

## 2024-09-27 VITALS — BP 120/73 | HR 96

## 2024-09-27 DIAGNOSIS — N8110 Cystocele, unspecified: Secondary | ICD-10-CM | POA: Diagnosis not present

## 2024-09-27 DIAGNOSIS — Z960 Presence of urogenital implants: Secondary | ICD-10-CM

## 2024-09-27 NOTE — Progress Notes (Signed)
 [facility]   Subjective:     Chief Complaint: Follow-up (Laura Blackwell is a 68 y.o. female is here for pessary refitting.)  History of Present Illness: Laura Blackwell is a 69 y.o. female with stage II pelvic organ prolapse who presents today for a pessary check. She has a #3 cube pessary, last cleaned on 1/16. She tried to removed the pessary over the weekend and pulled on the loop and part of the loop came out. She was then unable to remove on her own. She did start vaginal estrogen one time last week.   Past Medical History: Patient  has a past medical history of Arthritis, Diabetes mellitus without complication (HCC), Edema, Hyperlipidemia, Hypertension, Neuropathy, PCOS (polycystic ovarian syndrome), Spinal cord stroke (HCC), Superficial thrombophlebitis, Swelling, Venous insufficiency, and Vitamin D  deficiency.   Past Surgical History: She  has a past surgical history that includes left ankle surgery ; Joint replacement (2011 ); Total hip arthroplasty (Left, 12/02/2015); Breast biopsy (Right); Gastroc recession extremity (Right, 10/10/2020); and Foot arthrodesis (Right, 10/10/2020).   Medications: She has a current medication list which includes the following prescription(s): atorvastatin , estradiol , furosemide , gabapentin , glucose blood, insulin  pen needle, metformin , telmisartan, and vibegron .   Allergies: Patient is allergic to jardiance [empagliflozin].   Social History: Patient  reports that she quit smoking about 22 years ago. Her smoking use included cigarettes. She has never used smokeless tobacco. She reports that she does not drink alcohol and does not use drugs.      Objective:    BP 120/73   Pulse 96  Gen: No apparent distress, A&O x 3. Pelvic Exam: Normal external female genitalia.  Cube was noted to be in place with part of the loop outside of the vagina. This was grasped and pessary removed. SABRA Speculum showed small non-bleeding abrasion on  the posterior vaginal wall. Pessary was cleaned, a floss string was placed along with the silicone string, and replaced.    Assessment/Plan:    Assessment: Laura Blackwell is a 69 y.o. with stage II pelvic organ prolapse for a pessary check  Plan: - She wants to continue with this pessary since it has been working well for her.  - Discussed troubleshooting for pessary removal- change positions, bear down to bring pessary closer to opening, and place finger beside pessary to break the suction. Also placed another string in case the other dislodges. She is happy with this plan.   Follow-up in 3 months or sooner if needed All questions were answered.    Laura LOISE Caper, MD  Time spent: I spent 15 minutes dedicated to the care of this patient on the date of this encounter to include pre-visit review of records, face-to-face time with the patient and post visit documentation.

## 2024-10-11 ENCOUNTER — Ambulatory Visit: Admitting: Obstetrics and Gynecology

## 2024-12-20 ENCOUNTER — Ambulatory Visit: Admitting: Obstetrics and Gynecology

## 2025-06-26 ENCOUNTER — Ambulatory Visit: Admitting: Neurology
